# Patient Record
Sex: Female | Born: 1950 | Race: White | Hispanic: No | Smoking: Never smoker
Health system: Southern US, Community
[De-identification: ages and names within clinical notes are randomized; demographics above are authoritative.]

## PROBLEM LIST (undated history)

## (undated) DIAGNOSIS — K219 Gastro-esophageal reflux disease without esophagitis: Secondary | ICD-10-CM

## (undated) DIAGNOSIS — J449 Chronic obstructive pulmonary disease, unspecified: Secondary | ICD-10-CM

## (undated) DIAGNOSIS — B373 Candidiasis of vulva and vagina: Secondary | ICD-10-CM

## (undated) DIAGNOSIS — N200 Calculus of kidney: Secondary | ICD-10-CM

## (undated) DIAGNOSIS — F329 Major depressive disorder, single episode, unspecified: Secondary | ICD-10-CM

## (undated) DIAGNOSIS — T4145XA Adverse effect of unspecified anesthetic, initial encounter: Secondary | ICD-10-CM

## (undated) DIAGNOSIS — F32A Depression, unspecified: Secondary | ICD-10-CM

## (undated) DIAGNOSIS — R3129 Other microscopic hematuria: Secondary | ICD-10-CM

## (undated) DIAGNOSIS — R1011 Right upper quadrant pain: Secondary | ICD-10-CM

## (undated) DIAGNOSIS — E785 Hyperlipidemia, unspecified: Secondary | ICD-10-CM

## (undated) DIAGNOSIS — Z972 Presence of dental prosthetic device (complete) (partial): Secondary | ICD-10-CM

## (undated) DIAGNOSIS — K5792 Diverticulitis of intestine, part unspecified, without perforation or abscess without bleeding: Secondary | ICD-10-CM

## (undated) DIAGNOSIS — F419 Anxiety disorder, unspecified: Secondary | ICD-10-CM

## (undated) DIAGNOSIS — G4730 Sleep apnea, unspecified: Secondary | ICD-10-CM

## (undated) DIAGNOSIS — IMO0001 Reserved for inherently not codable concepts without codable children: Secondary | ICD-10-CM

## (undated) DIAGNOSIS — T8859XA Other complications of anesthesia, initial encounter: Secondary | ICD-10-CM

## (undated) DIAGNOSIS — N3946 Mixed incontinence: Secondary | ICD-10-CM

## (undated) DIAGNOSIS — T7840XA Allergy, unspecified, initial encounter: Secondary | ICD-10-CM

## (undated) DIAGNOSIS — B3731 Acute candidiasis of vulva and vagina: Secondary | ICD-10-CM

## (undated) DIAGNOSIS — H409 Unspecified glaucoma: Secondary | ICD-10-CM

## (undated) DIAGNOSIS — M1990 Unspecified osteoarthritis, unspecified site: Secondary | ICD-10-CM

## (undated) DIAGNOSIS — E079 Disorder of thyroid, unspecified: Secondary | ICD-10-CM

## (undated) DIAGNOSIS — J45909 Unspecified asthma, uncomplicated: Secondary | ICD-10-CM

## (undated) HISTORY — DX: Depression, unspecified: F32.A

## (undated) HISTORY — PX: EYE SURGERY: SHX253

## (undated) HISTORY — DX: Hyperlipidemia, unspecified: E78.5

## (undated) HISTORY — PX: JOINT REPLACEMENT: SHX530

## (undated) HISTORY — DX: Right upper quadrant pain: R10.11

## (undated) HISTORY — DX: Gastro-esophageal reflux disease without esophagitis: K21.9

## (undated) HISTORY — DX: Mixed incontinence: N39.46

## (undated) HISTORY — DX: Anxiety disorder, unspecified: F41.9

## (undated) HISTORY — DX: Unspecified asthma, uncomplicated: J45.909

## (undated) HISTORY — PX: BACK SURGERY: SHX140

## (undated) HISTORY — DX: Other microscopic hematuria: R31.29

## (undated) HISTORY — DX: Disorder of thyroid, unspecified: E07.9

## (undated) HISTORY — DX: Diverticulitis of intestine, part unspecified, without perforation or abscess without bleeding: K57.92

## (undated) HISTORY — DX: Allergy, unspecified, initial encounter: T78.40XA

## (undated) HISTORY — PX: TOTAL HIP ARTHROPLASTY: SHX124

## (undated) HISTORY — DX: Unspecified glaucoma: H40.9

## (undated) HISTORY — DX: Unspecified osteoarthritis, unspecified site: M19.90

## (undated) HISTORY — DX: Morbid (severe) obesity due to excess calories: E66.01

## (undated) HISTORY — DX: Sleep apnea, unspecified: G47.30

## (undated) HISTORY — DX: Acute candidiasis of vulva and vagina: B37.31

## (undated) HISTORY — PX: SPINE SURGERY: SHX786

## (undated) HISTORY — DX: Major depressive disorder, single episode, unspecified: F32.9

## (undated) HISTORY — DX: Calculus of kidney: N20.0

## (undated) HISTORY — DX: Candidiasis of vulva and vagina: B37.3

---

## 1996-09-08 HISTORY — PX: ABDOMINAL HYSTERECTOMY: SHX81

## 1998-09-08 HISTORY — PX: OTHER SURGICAL HISTORY: SHX169

## 2007-03-03 ENCOUNTER — Ambulatory Visit: Payer: Self-pay

## 2010-09-08 HISTORY — PX: LITHOTRIPSY: SUR834

## 2012-09-01 ENCOUNTER — Emergency Department: Payer: Self-pay | Admitting: Emergency Medicine

## 2012-09-01 LAB — URINALYSIS, COMPLETE
Bilirubin,UR: NEGATIVE
Blood: NEGATIVE
Glucose,UR: NEGATIVE mg/dL (ref 0–75)
Protein: 30
RBC,UR: 1 /HPF (ref 0–5)
Squamous Epithelial: 2
WBC UR: 423 /HPF (ref 0–5)

## 2012-09-04 LAB — URINE CULTURE

## 2012-09-08 HISTORY — PX: CATARACT EXTRACTION, BILATERAL: SHX1313

## 2013-09-19 ENCOUNTER — Emergency Department: Payer: Self-pay | Admitting: Emergency Medicine

## 2013-09-19 LAB — RAPID INFLUENZA A&B ANTIGENS

## 2013-09-19 LAB — CBC
HCT: 35.6 % (ref 35.0–47.0)
HGB: 11.7 g/dL — AB (ref 12.0–16.0)
MCH: 26.1 pg (ref 26.0–34.0)
MCHC: 32.8 g/dL (ref 32.0–36.0)
MCV: 80 fL (ref 80–100)
Platelet: 661 10*3/uL — ABNORMAL HIGH (ref 150–440)
RBC: 4.47 10*6/uL (ref 3.80–5.20)
RDW: 16.1 % — AB (ref 11.5–14.5)
WBC: 15.5 10*3/uL — ABNORMAL HIGH (ref 3.6–11.0)

## 2013-09-19 LAB — URINALYSIS, COMPLETE
BILIRUBIN, UR: NEGATIVE
GLUCOSE, UR: NEGATIVE mg/dL (ref 0–75)
Ketone: NEGATIVE
NITRITE: POSITIVE
PH: 5 (ref 4.5–8.0)
Protein: NEGATIVE
RBC,UR: 11 /HPF (ref 0–5)
Specific Gravity: 1.017 (ref 1.003–1.030)
Squamous Epithelial: 4
WBC UR: 51 /HPF (ref 0–5)

## 2013-09-19 LAB — COMPREHENSIVE METABOLIC PANEL
Albumin: 3.2 g/dL — ABNORMAL LOW (ref 3.4–5.0)
Alkaline Phosphatase: 137 U/L — ABNORMAL HIGH
Anion Gap: 5 — ABNORMAL LOW (ref 7–16)
BILIRUBIN TOTAL: 0.3 mg/dL (ref 0.2–1.0)
BUN: 18 mg/dL (ref 7–18)
CO2: 26 mmol/L (ref 21–32)
Calcium, Total: 9.1 mg/dL (ref 8.5–10.1)
Chloride: 106 mmol/L (ref 98–107)
Creatinine: 1.13 mg/dL (ref 0.60–1.30)
EGFR (African American): 60 — ABNORMAL LOW
GFR CALC NON AF AMER: 52 — AB
Glucose: 97 mg/dL (ref 65–99)
Osmolality: 276 (ref 275–301)
Potassium: 3.8 mmol/L (ref 3.5–5.1)
SGOT(AST): 11 U/L — ABNORMAL LOW (ref 15–37)
SGPT (ALT): 14 U/L (ref 12–78)
SODIUM: 137 mmol/L (ref 136–145)
Total Protein: 7.8 g/dL (ref 6.4–8.2)

## 2013-09-19 LAB — CK TOTAL AND CKMB (NOT AT ARMC): CK, Total: 28 U/L (ref 21–215)

## 2013-09-19 LAB — PRO B NATRIURETIC PEPTIDE: B-Type Natriuretic Peptide: 19 pg/mL (ref 0–125)

## 2013-09-19 LAB — TROPONIN I: Troponin-I: <0.02 ng/mL

## 2013-09-24 ENCOUNTER — Emergency Department: Payer: Self-pay | Admitting: Emergency Medicine

## 2013-09-24 LAB — COMPREHENSIVE METABOLIC PANEL
ALBUMIN: 3 g/dL — AB (ref 3.4–5.0)
ANION GAP: 10 (ref 7–16)
Alkaline Phosphatase: 109 U/L
BUN: 15 mg/dL (ref 7–18)
Bilirubin,Total: 0.4 mg/dL (ref 0.2–1.0)
Calcium, Total: 8.8 mg/dL (ref 8.5–10.1)
Chloride: 105 mmol/L (ref 98–107)
Co2: 23 mmol/L (ref 21–32)
Creatinine: 1.25 mg/dL (ref 0.60–1.30)
EGFR (African American): 53 — ABNORMAL LOW
GFR CALC NON AF AMER: 46 — AB
GLUCOSE: 101 mg/dL — AB (ref 65–99)
OSMOLALITY: 277 (ref 275–301)
Potassium: 3.8 mmol/L (ref 3.5–5.1)
SGOT(AST): 11 U/L — ABNORMAL LOW (ref 15–37)
SGPT (ALT): 15 U/L (ref 12–78)
Sodium: 138 mmol/L (ref 136–145)
Total Protein: 6.9 g/dL (ref 6.4–8.2)

## 2013-09-24 LAB — CBC WITH DIFFERENTIAL/PLATELET
Basophil #: 0.1 10*3/uL (ref 0.0–0.1)
Basophil %: 0.6 %
Eosinophil #: 0 10*3/uL (ref 0.0–0.7)
Eosinophil %: 0.5 %
HCT: 32.1 % — AB (ref 35.0–47.0)
HGB: 10.5 g/dL — AB (ref 12.0–16.0)
LYMPHS ABS: 1.1 10*3/uL (ref 1.0–3.6)
Lymphocyte %: 13.7 %
MCH: 26.1 pg (ref 26.0–34.0)
MCHC: 32.7 g/dL (ref 32.0–36.0)
MCV: 80 fL (ref 80–100)
Monocyte #: 1.3 x10 3/mm — ABNORMAL HIGH (ref 0.2–0.9)
Monocyte %: 15.8 %
Neutrophil #: 5.8 10*3/uL (ref 1.4–6.5)
Neutrophil %: 69.4 %
Platelet: 486 10*3/uL — ABNORMAL HIGH (ref 150–440)
RBC: 4.02 10*6/uL (ref 3.80–5.20)
RDW: 16.9 % — ABNORMAL HIGH (ref 11.5–14.5)
WBC: 8.3 10*3/uL (ref 3.6–11.0)

## 2013-09-24 LAB — URINALYSIS, COMPLETE
BACTERIA: NONE SEEN
BILIRUBIN, UR: NEGATIVE
GLUCOSE, UR: NEGATIVE mg/dL (ref 0–75)
Ketone: NEGATIVE
NITRITE: NEGATIVE
Ph: 5 (ref 4.5–8.0)
Protein: NEGATIVE
RBC,UR: 3 /HPF (ref 0–5)
SPECIFIC GRAVITY: 1.015 (ref 1.003–1.030)
Squamous Epithelial: 7
WBC UR: 5 /HPF (ref 0–5)

## 2013-11-07 ENCOUNTER — Emergency Department: Payer: Self-pay | Admitting: Emergency Medicine

## 2013-11-07 LAB — COMPREHENSIVE METABOLIC PANEL
ALT: 19 U/L (ref 12–78)
ANION GAP: 9 (ref 7–16)
AST: 14 U/L — AB (ref 15–37)
Albumin: 3.5 g/dL (ref 3.4–5.0)
Alkaline Phosphatase: 122 U/L — ABNORMAL HIGH
BUN: 13 mg/dL (ref 7–18)
Bilirubin,Total: 0.4 mg/dL (ref 0.2–1.0)
CHLORIDE: 103 mmol/L (ref 98–107)
Calcium, Total: 9.5 mg/dL (ref 8.5–10.1)
Co2: 24 mmol/L (ref 21–32)
Creatinine: 1.11 mg/dL (ref 0.60–1.30)
GFR CALC NON AF AMER: 53 — AB
GLUCOSE: 100 mg/dL — AB (ref 65–99)
OSMOLALITY: 272 (ref 275–301)
POTASSIUM: 3.8 mmol/L (ref 3.5–5.1)
Sodium: 136 mmol/L (ref 136–145)
TOTAL PROTEIN: 7.8 g/dL (ref 6.4–8.2)

## 2013-11-07 LAB — CBC WITH DIFFERENTIAL/PLATELET
BASOS PCT: 0.9 %
Basophil #: 0.1 10*3/uL (ref 0.0–0.1)
EOS PCT: 0.3 %
Eosinophil #: 0 10*3/uL (ref 0.0–0.7)
HCT: 36 % (ref 35.0–47.0)
HGB: 11.3 g/dL — ABNORMAL LOW (ref 12.0–16.0)
Lymphocyte #: 1.6 10*3/uL (ref 1.0–3.6)
Lymphocyte %: 15.3 %
MCH: 25.4 pg — ABNORMAL LOW (ref 26.0–34.0)
MCHC: 31.4 g/dL — AB (ref 32.0–36.0)
MCV: 81 fL (ref 80–100)
Monocyte #: 1.3 x10 3/mm — ABNORMAL HIGH (ref 0.2–0.9)
Monocyte %: 12.6 %
NEUTROS ABS: 7.3 10*3/uL — AB (ref 1.4–6.5)
Neutrophil %: 70.9 %
PLATELETS: 363 10*3/uL (ref 150–440)
RBC: 4.46 10*6/uL (ref 3.80–5.20)
RDW: 16.3 % — ABNORMAL HIGH (ref 11.5–14.5)
WBC: 10.4 10*3/uL (ref 3.6–11.0)

## 2013-11-07 LAB — URINALYSIS, COMPLETE
Bilirubin,UR: NEGATIVE
Glucose,UR: NEGATIVE mg/dL (ref 0–75)
KETONE: NEGATIVE
Nitrite: NEGATIVE
PH: 5 (ref 4.5–8.0)
Protein: NEGATIVE
RBC,UR: 11 /HPF (ref 0–5)
SPECIFIC GRAVITY: 1.015 (ref 1.003–1.030)
Squamous Epithelial: 1
WBC UR: 35 /HPF (ref 0–5)

## 2013-11-10 LAB — URINE CULTURE

## 2014-01-25 ENCOUNTER — Emergency Department: Payer: Self-pay

## 2014-01-25 LAB — URINALYSIS, COMPLETE
Bacteria: NONE SEEN
Bilirubin,UR: NEGATIVE
Blood: NEGATIVE
Glucose,UR: NEGATIVE mg/dL (ref 0–75)
Ketone: NEGATIVE
NITRITE: POSITIVE
Ph: 5 (ref 4.5–8.0)
Protein: NEGATIVE
Specific Gravity: 1.018 (ref 1.003–1.030)
Squamous Epithelial: 7
WBC UR: 14 /HPF (ref 0–5)

## 2014-04-09 ENCOUNTER — Emergency Department: Payer: Self-pay | Admitting: Emergency Medicine

## 2014-04-09 LAB — CBC WITH DIFFERENTIAL/PLATELET
BASOS ABS: 0.1 10*3/uL (ref 0.0–0.1)
Basophil %: 0.8 %
Eosinophil #: 0.1 10*3/uL (ref 0.0–0.7)
Eosinophil %: 0.7 %
HCT: 35.4 % (ref 35.0–47.0)
HGB: 11.2 g/dL — AB (ref 12.0–16.0)
LYMPHS PCT: 17.3 %
Lymphocyte #: 2.1 10*3/uL (ref 1.0–3.6)
MCH: 25.1 pg — ABNORMAL LOW (ref 26.0–34.0)
MCHC: 31.7 g/dL — ABNORMAL LOW (ref 32.0–36.0)
MCV: 79 fL — ABNORMAL LOW (ref 80–100)
MONO ABS: 1.6 x10 3/mm — AB (ref 0.2–0.9)
Monocyte %: 12.7 %
Neutrophil #: 8.4 10*3/uL — ABNORMAL HIGH (ref 1.4–6.5)
Neutrophil %: 68.5 %
PLATELETS: 447 10*3/uL — AB (ref 150–440)
RBC: 4.48 10*6/uL (ref 3.80–5.20)
RDW: 16.1 % — AB (ref 11.5–14.5)
WBC: 12.2 10*3/uL — ABNORMAL HIGH (ref 3.6–11.0)

## 2014-04-09 LAB — URINALYSIS, COMPLETE
BILIRUBIN, UR: NEGATIVE
Blood: NEGATIVE
Glucose,UR: NEGATIVE mg/dL (ref 0–75)
KETONE: NEGATIVE
NITRITE: NEGATIVE
Ph: 5 (ref 4.5–8.0)
Protein: 25
Specific Gravity: 1.01 (ref 1.003–1.030)
Squamous Epithelial: 2
Transitional Epi: 1
WBC UR: 729 /HPF (ref 0–5)

## 2014-04-09 LAB — COMPREHENSIVE METABOLIC PANEL
ALK PHOS: 126 U/L — AB
ALT: 20 U/L
ANION GAP: 13 (ref 7–16)
AST: 21 U/L (ref 15–37)
Albumin: 3.4 g/dL (ref 3.4–5.0)
BILIRUBIN TOTAL: 0.3 mg/dL (ref 0.2–1.0)
BUN: 13 mg/dL (ref 7–18)
CALCIUM: 9.2 mg/dL (ref 8.5–10.1)
CREATININE: 1.1 mg/dL (ref 0.60–1.30)
Chloride: 106 mmol/L (ref 98–107)
Co2: 20 mmol/L — ABNORMAL LOW (ref 21–32)
EGFR (African American): >60
EGFR (Non-African Amer.): 53 — ABNORMAL LOW
Glucose: 103 mg/dL — ABNORMAL HIGH (ref 65–99)
Osmolality: 278 (ref 275–301)
POTASSIUM: 4.2 mmol/L (ref 3.5–5.1)
Sodium: 139 mmol/L (ref 136–145)
TOTAL PROTEIN: 7.3 g/dL (ref 6.4–8.2)

## 2014-04-11 LAB — URINE CULTURE

## 2014-04-14 LAB — CULTURE, BLOOD (SINGLE)

## 2014-07-24 ENCOUNTER — Emergency Department: Payer: Self-pay | Admitting: Emergency Medicine

## 2014-07-24 LAB — CBC WITH DIFFERENTIAL/PLATELET
Basophil #: 0.2 10*3/uL — ABNORMAL HIGH (ref 0.0–0.1)
Basophil %: 1.5 %
Eosinophil #: 0.2 10*3/uL (ref 0.0–0.7)
Eosinophil %: 1.4 %
HCT: 37.4 % (ref 35.0–47.0)
HGB: 11.7 g/dL — ABNORMAL LOW (ref 12.0–16.0)
LYMPHS ABS: 2.6 10*3/uL (ref 1.0–3.6)
Lymphocyte %: 23.2 %
MCH: 24.3 pg — AB (ref 26.0–34.0)
MCHC: 31.2 g/dL — AB (ref 32.0–36.0)
MCV: 78 fL — ABNORMAL LOW (ref 80–100)
MONO ABS: 0.9 x10 3/mm (ref 0.2–0.9)
MONOS PCT: 8.3 %
NEUTROS PCT: 65.6 %
Neutrophil #: 7.4 10*3/uL — ABNORMAL HIGH (ref 1.4–6.5)
Platelet: 437 10*3/uL (ref 150–440)
RBC: 4.8 10*6/uL (ref 3.80–5.20)
RDW: 17.7 % — AB (ref 11.5–14.5)
WBC: 11.3 10*3/uL — ABNORMAL HIGH (ref 3.6–11.0)

## 2014-07-24 LAB — COMPREHENSIVE METABOLIC PANEL
ALT: 20 U/L
Albumin: 3 g/dL — ABNORMAL LOW (ref 3.4–5.0)
Alkaline Phosphatase: 141 U/L — ABNORMAL HIGH
Anion Gap: 11 (ref 7–16)
BUN: 14 mg/dL (ref 7–18)
Bilirubin,Total: 0.3 mg/dL (ref 0.2–1.0)
CALCIUM: 8.7 mg/dL (ref 8.5–10.1)
CHLORIDE: 107 mmol/L (ref 98–107)
CO2: 22 mmol/L (ref 21–32)
CREATININE: 1.18 mg/dL (ref 0.60–1.30)
EGFR (Non-African Amer.): 49 — ABNORMAL LOW
GFR CALC AF AMER: 60 — AB
Glucose: 122 mg/dL — ABNORMAL HIGH (ref 65–99)
Osmolality: 281 (ref 275–301)
POTASSIUM: 3.8 mmol/L (ref 3.5–5.1)
SGOT(AST): 15 U/L (ref 15–37)
SODIUM: 140 mmol/L (ref 136–145)
TOTAL PROTEIN: 7.3 g/dL (ref 6.4–8.2)

## 2014-07-24 LAB — URINALYSIS, COMPLETE
BILIRUBIN, UR: NEGATIVE
Blood: NEGATIVE
Glucose,UR: NEGATIVE mg/dL (ref 0–75)
Ketone: NEGATIVE
Nitrite: NEGATIVE
PROTEIN: NEGATIVE
Ph: 6 (ref 4.5–8.0)
RBC,UR: NONE SEEN /HPF (ref 0–5)
Specific Gravity: 1.014 (ref 1.003–1.030)
Squamous Epithelial: 5
WBC UR: 20 /HPF (ref 0–5)

## 2014-08-27 ENCOUNTER — Emergency Department: Payer: Self-pay | Admitting: Emergency Medicine

## 2014-08-30 LAB — CBC
HCT: 40.2 % (ref 35.0–47.0)
HGB: 12.7 g/dL (ref 12.0–16.0)
MCH: 24.9 pg — ABNORMAL LOW (ref 26.0–34.0)
MCHC: 31.6 g/dL — AB (ref 32.0–36.0)
MCV: 79 fL — AB (ref 80–100)
Platelet: 420 10*3/uL (ref 150–440)
RBC: 5.09 10*6/uL (ref 3.80–5.20)
RDW: 17.3 % — ABNORMAL HIGH (ref 11.5–14.5)
WBC: 5.4 10*3/uL (ref 3.6–11.0)

## 2014-08-30 LAB — BASIC METABOLIC PANEL
Anion Gap: 10 (ref 7–16)
BUN: 13 mg/dL (ref 7–18)
Calcium, Total: 8.6 mg/dL (ref 8.5–10.1)
Chloride: 107 mmol/L (ref 98–107)
Co2: 22 mmol/L (ref 21–32)
Creatinine: 1.26 mg/dL (ref 0.60–1.30)
EGFR (African American): 55 — ABNORMAL LOW
GFR CALC NON AF AMER: 46 — AB
Glucose: 87 mg/dL (ref 65–99)
Osmolality: 277 (ref 275–301)
Potassium: 4.2 mmol/L (ref 3.5–5.1)
SODIUM: 139 mmol/L (ref 136–145)

## 2014-08-30 LAB — TROPONIN I: Troponin-I: <0.02 ng/mL

## 2014-08-31 ENCOUNTER — Inpatient Hospital Stay: Payer: Self-pay | Admitting: Internal Medicine

## 2014-08-31 ENCOUNTER — Inpatient Hospital Stay (HOSPITAL_COMMUNITY)
Admission: EM | Admit: 2014-08-31 | Payer: Self-pay | Source: Other Acute Inpatient Hospital | Admitting: Internal Medicine

## 2014-09-01 LAB — CBC WITH DIFFERENTIAL/PLATELET
Basophil #: 0 10*3/uL (ref 0.0–0.1)
Basophil %: 0.2 %
EOS ABS: 0 10*3/uL (ref 0.0–0.7)
Eosinophil %: 0 %
HCT: 36 % (ref 35.0–47.0)
HGB: 11.2 g/dL — ABNORMAL LOW (ref 12.0–16.0)
Lymphocyte #: 1.1 10*3/uL (ref 1.0–3.6)
Lymphocyte %: 9.3 %
MCH: 24.6 pg — AB (ref 26.0–34.0)
MCHC: 31 g/dL — ABNORMAL LOW (ref 32.0–36.0)
MCV: 79 fL — AB (ref 80–100)
MONO ABS: 0.9 x10 3/mm (ref 0.2–0.9)
Monocyte %: 7.2 %
NEUTROS PCT: 83.3 %
Neutrophil #: 10.1 10*3/uL — ABNORMAL HIGH (ref 1.4–6.5)
Platelet: 389 10*3/uL (ref 150–440)
RBC: 4.53 10*6/uL (ref 3.80–5.20)
RDW: 17.4 % — ABNORMAL HIGH (ref 11.5–14.5)
WBC: 12.1 10*3/uL — AB (ref 3.6–11.0)

## 2014-09-05 LAB — CULTURE, BLOOD (SINGLE)

## 2014-12-11 DIAGNOSIS — H409 Unspecified glaucoma: Secondary | ICD-10-CM | POA: Insufficient documentation

## 2014-12-11 DIAGNOSIS — K219 Gastro-esophageal reflux disease without esophagitis: Secondary | ICD-10-CM | POA: Insufficient documentation

## 2014-12-11 DIAGNOSIS — F329 Major depressive disorder, single episode, unspecified: Secondary | ICD-10-CM | POA: Insufficient documentation

## 2014-12-11 DIAGNOSIS — N951 Menopausal and female climacteric states: Secondary | ICD-10-CM | POA: Insufficient documentation

## 2014-12-11 DIAGNOSIS — G4733 Obstructive sleep apnea (adult) (pediatric): Secondary | ICD-10-CM | POA: Insufficient documentation

## 2014-12-11 DIAGNOSIS — K5792 Diverticulitis of intestine, part unspecified, without perforation or abscess without bleeding: Secondary | ICD-10-CM | POA: Insufficient documentation

## 2014-12-11 DIAGNOSIS — F32A Depression, unspecified: Secondary | ICD-10-CM | POA: Insufficient documentation

## 2014-12-11 DIAGNOSIS — N3941 Urge incontinence: Secondary | ICD-10-CM | POA: Insufficient documentation

## 2014-12-11 DIAGNOSIS — M47816 Spondylosis without myelopathy or radiculopathy, lumbar region: Secondary | ICD-10-CM | POA: Insufficient documentation

## 2014-12-11 DIAGNOSIS — N390 Urinary tract infection, site not specified: Secondary | ICD-10-CM | POA: Insufficient documentation

## 2014-12-11 DIAGNOSIS — Z7189 Other specified counseling: Secondary | ICD-10-CM | POA: Insufficient documentation

## 2014-12-11 DIAGNOSIS — J45909 Unspecified asthma, uncomplicated: Secondary | ICD-10-CM | POA: Insufficient documentation

## 2014-12-11 DIAGNOSIS — E785 Hyperlipidemia, unspecified: Secondary | ICD-10-CM | POA: Insufficient documentation

## 2014-12-11 DIAGNOSIS — N200 Calculus of kidney: Secondary | ICD-10-CM | POA: Insufficient documentation

## 2014-12-11 DIAGNOSIS — J4540 Moderate persistent asthma, uncomplicated: Secondary | ICD-10-CM | POA: Insufficient documentation

## 2014-12-11 DIAGNOSIS — B3731 Acute candidiasis of vulva and vagina: Secondary | ICD-10-CM | POA: Insufficient documentation

## 2014-12-11 DIAGNOSIS — Z0110 Encounter for examination of ears and hearing without abnormal findings: Secondary | ICD-10-CM | POA: Insufficient documentation

## 2014-12-11 DIAGNOSIS — B373 Candidiasis of vulva and vagina: Secondary | ICD-10-CM | POA: Insufficient documentation

## 2014-12-11 DIAGNOSIS — IMO0002 Reserved for concepts with insufficient information to code with codable children: Secondary | ICD-10-CM | POA: Insufficient documentation

## 2014-12-15 ENCOUNTER — Ambulatory Visit
Admit: 2014-12-15 | Disposition: A | Discharge status: Home / Self Care | Payer: Self-pay | Attending: Urology | Admitting: Urology

## 2014-12-27 ENCOUNTER — Ambulatory Visit
Admit: 2014-12-27 | Disposition: A | Discharge status: Home / Self Care | Payer: Self-pay | Attending: Obstetrics and Gynecology | Admitting: Obstetrics and Gynecology

## 2014-12-30 NOTE — H&P (Signed)
PATIENT NAME:  Faith Padilla, Faith Padilla MR#:  [id number] DATE OF BIRTH:  [birth year]  DATE OF ADMISSION:  08/30/2014  REFERRING DOCTOR: Loney Hering, MD  PRIMARY CARE DOCTOR: [place] , in [place], [place].   CHIEF COMPLAINT: Cough ongoing for the past 5 days.    HISTORY OF PRESENT ILLNESS: A 64 year old Caucasian female with a past medical history significant for bronchial asthma, hyperlipidemia, diverticulitis, kidney stones, depression, glaucoma, sleep apnea on CPAP presents to the Emergency Room accompanied by her daughter with the complaints of ongoing cough for the past 5 days. The patient states she has been having some cough and not able to bring up any phlegm for the past 5 days for which she was seen at a local urgent care center and was given Z-Pak. She continued to have worsening of the cough with low grade fever. Hence, she came to the Emergency Room for further evaluation. In the Emergency Room, the patient was evaluated by the ED physician and was found to have severe cough and also shortness of breath for which she received multiple doses of DuoNeb nebulizers and had workup done, which was essentially normal blood work and chest x-ray was negative for any infection. The hospitalist service was consulted for further evaluation and management. The patient still has significant cough and shortness of breath at this time and she stated that she is not feeling any anything better compared to when she came here. Of note, her, oxygen saturation on room air are running around 95%-96% at this time.    PAST MEDICAL HISTORY:  1.  Bronchial asthma.  2.  Hyperlipidemia.  3.  Diverticulitis.   4.  Kidney stones.  5.  Obstructive sleep apnea on CPAP.  6.  Glaucoma.  7.  Depression.   PAST SURGICAL HISTORY:  1.  Status post hysterectomy.  2.  Back surgery.   ALLERGIES: MORPHINE.   HOME MEDICATIONS: Symbicort, omeprazole, Zyrtec, meloxicam, Singulair, duloxetine, albuterol, Flonase.     FAMILY HISTORY: Significant for heart disease and cancers.   SOCIAL HISTORY: She is divorced, lives at home with her daughter. No history of smoking, alcohol, or substance abuse.    REVIEW OF SYSTEMS:   CONSTITUTIONAL: Positive for low-grade fever for the past 5 days associated with some cough. Generalized weakness present.  EYES: Negative for blurred vision, double vision. No pain. No redness. No inflammation.  EARS, NOSE, AND THROAT: Negative for tinnitus, ear pain, hearing loss, epistaxis, nasal discharge, postnasal drip.  RESPIRATORY: Positive for cough, wheezing with dyspnea ongoing for the past 5 days. No painful respirations. No hemoptysis.   CARDIOVASCULAR: Negative for chest pain. No pedal edema. No palpitations. No dizziness or syncope.  GASTROINTESTINAL: Negative for nausea, vomiting, diarrhea, abdominal pain, hematemesis, or GERD symptoms.  GENITOURINARY: Negative for disease or hematuria, frequency, or urgency.  ENDOCRINE: Negative for polyuria or nocturia. No thyroid problems. No heat or cold intolerance.  HEMATOLOGIC: Negative for anemia, easy bruising or bleeding, or swollen glands.  INTEGUMENTARY: Negative for acne, skin rash, or lesions.  MUSCULOSKELETAL: Negative for any arthritis, joint swellings, or pain.  NEUROLOGICAL: Negative for focal weakness, numbness. No history of CVA, TIA, or seizure disorder.  PSYCHIATRIC: Positive for depression for which she takes medications.   PHYSICAL EXAMINATION:  VITAL SIGNS: Temperature 99.4 disorder, pulse rate 108 per minute, respirations 20 per minute, blood pressure 155/91, oxygen saturation 94% on room air.  GENERAL: Well developed, well-nourished. Alert, awake, and oriented. In mild to moderate respiratory distress.  HEAD: Atraumatic, normocephalic.  EYES: Pupils are equal and reactive to light and accommodation. No conjunctival pallor. No scleral icterus. Extraocular movements are intact.  NOSE: No nasal lesions. No drainage.   EARS: No drainage. No external lesions.  ORAL CAVITY: No mucosal lesions. No exudates.  NECK: Supple. No JVD. No thyromegaly. No carotid bruit. Range of motion of neck normal.  RESPIRATORY: Bilateral rhonchi present. Using accessory muscles of respiration.  CARDIOVASCULAR: S1, S2 regular. Tachycardia present. Peripheral pulses equal at carotid, femoral, and pedal pulses. No peripheral edema.  GASTROINTESTINAL: Abdomen is soft, nontender. No hepatosplenomegaly. Bowel sounds present and equal in all 4 quadrants. No tenderness. No guarding. No rigidity.  GENITOURINARY: Deferred.  MUSCULOSKELETAL: Range of motion adequate in all areas. Strength and tone equal bilaterally in both upper and lower extremities.  SKIN: Inspection within normal limits.  LYMPHATIC: No cervical lymphadenopathy.  VASCULAR: Good dorsalis pedis and posterior tibial pulses.  NEUROLOGICAL: Alert, awake, and oriented x 3. Cranial nerves II-XII grossly intact. DTRs 2+ bilateral in both upper and lower extremities. Motor strength is 5/5 in both upper and lower extremities.  PSYCHIATRIC: Judgment and insight are adequate. Alert and oriented x 3. Memory and mood within normal limits.   LABORATORY DATA: Serum glucose 87, BUN 13, creatinine 1.26, sodium 138, potassium 4.2, chloride 107, bicarbonate 22, total calcium 8.6. Troponin less than 0.02. WBC 5.4, hemoglobin 12.7, hematocrit 40.2, platelet count 420,000.   IMAGING STUDIES:  Chest x-ray:  1.  No acute cardiopulmonary process.  2.  Moderate to large hiatal hernia seen.   ASSESSMENT AND PLAN: A 64 year old Caucasian female with a past medical history significant for bronchial asthma, hyperlipidemia, diverticulosis, obstructive sleep apnea on CPAP, history of kidney stones, depression, and glaucoma presents to the Emergency Room with the complaints of ongoing cough and shortness of breath with wheezing for the past 5 days.   1.  Acute respiratory distress secondary to bronchial  asthma exacerbation.  PLAN: Admit to stepdown unit in critical care unit, oxygen supplementation, vigorous nebulizers, intravenous Solu-Medrol. Sputum cultures and sensitivity. Start Levaquin for bronchitis vs early pneumonia. Monitor oxygen saturation.  2.  Acute exacerbation of bronchial asthma secondary due to acute bronchitis, rule out pneumonia.  PLAN: Vigorous nebulizers, oxygen supplementation, intravenous  Solu-Medrol, intravenous Levaquin monitor, oxygen saturation.  3.  Obstructive sleep apnea on CPAP: Continue CPAP.  4.  History of depression on duloxetine, stable: Continue same.  5.  History of hyperlipidemia: Continue home medications.  6.  Deep vein thrombosis prophylaxis: Subcutaneous Lovenox.  7.  Gastrointestinal prophylaxis: Protonix.   CODE STATUS: Full code.   TIME SPENT: 50 minutes.     ____________________________ Juluis Mire, MD enr:bm D: 08/31/2014 05:32:00 ET T: 08/31/2014 06:21:20 ET JOB#: [id number]  cc: Juluis Mire, MD, <Dictator> Juluis Mire MD ELECTRONICALLY SIGNED 08/31/2014 20:33

## 2015-01-03 NOTE — Discharge Summary (Signed)
PATIENT NAME:  Faith Padilla, DIPIERRO MR#:  [id number] DATE OF BIRTH:  05/30/1951  DATE OF ADMISSION:  08/31/2014 DATE OF DISCHARGE:  09/01/2014  ADMITTING PHYSICIAN:  Dr. Reece Levy  DISCHARGING PHYSICIAN: Gladstone Lighter, MD.   PRIMARY CARE PHYSICIAN:  In [place], [place], [place]: None.   DISCHARGE DIAGNOSES:  1.  Asthma exacerbation with bronchitis.  2.  Obstructive sleep apnea on CPAP.  3.  Diverticulosis.  4.  Hyperlipidemia.  5.  Depression.   DISCHARGE HOME MEDICATIONS:  1.  Flovent inhaler 44 mcg 2 puffs inhaled twice a day. 2.  Proventil inhaler 90 mcg 2 puffs inhaled every 2 hours as needed for shortness of breath. 3.  Latanoprost ophthalmic 0.005% drops, 1 drop each eye once a day at bedtime.  4.  Cetrizine 10 mg p.o. daily.  5.  Singulair 10 mg p.o. daily.  6.  Cymbalta 30 mg p.o. daily.  7.  Flonase 50 mcg 2 sprays each nostril, twice a day. 8.  Omeprazole 20 mg p.o. b.i.d.  9.  Prednisone taper over 5 more days.  10. Levaquin 500 mg p.o. daily for 4 more days.   DISCHARGE OXYGEN: None.   DISCHARGE DIET: Low-sodium diet.   DISCHARGE ACTIVITY: As tolerated.     FOLLOWUP INSTRUCTIONS:   PCP to be followed up in 1 week.   LABORATORY DATA AND IMAGING STUDIES PRIOR TO DISCHARGE: WBC 12.9, hemoglobin 11.8, hematocrit 36.0, platelet count 389,000.   Lactic acid elevated at 2.7. Blood cultures negative. ABG showing pH of 7.45, pCO2 of 22, pO2 of 99, bicarbonate 21, saturations of 97% on room air Chest x-ray showing no acute cardiopulmonary process.  Lungs are evaluated and clear, moderate to large hernia noted.   Sodium 139, potassium 4.2, chloride 107, bicarbonate 22, BUN 13, creatinine 1.26, glucose of 87, calcium of 8.6.   BRIEF HOSPITAL COURSE: Ms. Torpey is a 64 year old Caucasian female admitted for acute asthma flare. She does have a known history of asthma, denies any sick contacts.  1.  Acute on chronic asthma exacerbation.  Started on steroids and empiric Levaquin for bronchitis symptoms and inhalers and nebs were given in the hospital, with significant improvement and the patient is being discharged home.  2.  Depression. She is on Cymbalta.  3.  Her course has been otherwise uneventful in the hospital.   DISCHARGE CONDITION: Stable.   DISCHARGE DISPOSITION: Home.   TIME SPENT ON DISCHARGE: 40 minutes.     ____________________________ Gladstone Lighter, MD rk:DT D: 09/02/2014 11:40:00 ET T: 09/02/2014 12:35:14 ET JOB#: [id number]  cc: Gladstone Lighter, MD, <Dictator> Gladstone Lighter MD ELECTRONICALLY SIGNED 09/12/2014 18:38

## 2015-02-08 ENCOUNTER — Ambulatory Visit: Payer: Medicaid Other | Admitting: Family Medicine

## 2015-02-12 ENCOUNTER — Other Ambulatory Visit: Payer: Self-pay | Admitting: Urology

## 2015-02-12 DIAGNOSIS — N200 Calculus of kidney: Secondary | ICD-10-CM

## 2015-02-14 ENCOUNTER — Ambulatory Visit: Payer: Self-pay | Admitting: Urology

## 2015-02-19 ENCOUNTER — Other Ambulatory Visit: Payer: Self-pay

## 2015-02-19 DIAGNOSIS — J4530 Mild persistent asthma, uncomplicated: Secondary | ICD-10-CM

## 2015-02-19 DIAGNOSIS — K219 Gastro-esophageal reflux disease without esophagitis: Secondary | ICD-10-CM

## 2015-02-19 MED ORDER — CETIRIZINE HCL 10 MG PO TABS: 10.0000 mg | ORAL_TABLET | Freq: Every day | ORAL | Status: DC

## 2015-02-19 MED ORDER — OMEPRAZOLE 20 MG PO CPDR: 20.0000 mg | DELAYED_RELEASE_CAPSULE | Freq: Two times a day (BID) | ORAL | Status: DC

## 2015-02-19 NOTE — Telephone Encounter (Signed)
Refill requested from patients pharmacy.

## 2015-03-05 ENCOUNTER — Encounter: Payer: Self-pay | Admitting: Urology

## 2015-03-05 ENCOUNTER — Ambulatory Visit
Admission: RE | Admit: 2015-03-05 | Discharge: 2015-03-05 | Disposition: A | Discharge status: Home / Self Care | Payer: Medicaid Other | Source: Ambulatory Visit | Attending: Urology | Admitting: Urology

## 2015-03-05 ENCOUNTER — Ambulatory Visit (INDEPENDENT_AMBULATORY_CARE_PROVIDER_SITE_OTHER): Payer: Medicaid Other | Admitting: Urology

## 2015-03-05 VITALS — BP 138/81 | HR 118 | Ht 63.0 in | Wt 268.8 lb

## 2015-03-05 DIAGNOSIS — R32 Unspecified urinary incontinence: Secondary | ICD-10-CM

## 2015-03-05 DIAGNOSIS — N200 Calculus of kidney: Secondary | ICD-10-CM

## 2015-03-05 DIAGNOSIS — N952 Postmenopausal atrophic vaginitis: Secondary | ICD-10-CM

## 2015-03-05 LAB — MICROSCOPIC EXAMINATION: Bacteria, UA: NONE SEEN

## 2015-03-05 LAB — URINALYSIS, COMPLETE
BILIRUBIN UA: NEGATIVE
GLUCOSE, UA: NEGATIVE
Leukocytes, UA: NEGATIVE
Nitrite, UA: NEGATIVE
PH UA: 5 (ref 5.0–7.5)
RBC UA: NEGATIVE
Specific Gravity, UA: 1.025 (ref 1.005–1.030)
Urobilinogen, Ur: 0.2 mg/dL (ref 0.2–1.0)

## 2015-03-05 LAB — BLADDER SCAN AMB NON-IMAGING: Scan Result: 48

## 2015-03-05 NOTE — Progress Notes (Signed)
In and Out Catheterization  Patient is present today for a I & O catheterization due to urinary incontinence. Patient was cleaned and prepped in a sterile fashion with betadine a 16FR cath was inserted with jelly on the tip  no complications were noted , 27ml of urine noted, urine was clear and light yellow in color. I & 0 performed to get a clean catch urine. Preformed by: Lyndee Hensen CMA

## 2015-03-05 NOTE — Progress Notes (Signed)
03/05/2015 3:06 PM   Faith Padilla [birth year] [id number]  Referring provider: Roselee Nova, MD [address] [place], [place] [address]  Chief Complaint  Patient presents with  . Urinary Incontinence    6 week follow up with KUB prior    HPI: Faith Padilla is a 64 year old white female with a history of kidney stones, urinary incontinence, microscopic hematuria and atrophic vaginitis presents today for 6 weeks follow-up.  At her last visit 6 weeks ago, Dr. Erlene Quan prescribed Myrbetriq 25 mg 1 tablet daily for her mixed urinary incontinence and advised her to lose weight.   She discontinued the Myrbetriq due to her inability to urinate after 2 weeks on the medication. She is now urinating, but she does not feels she is completely emptying her bladder.  Her PVR today is 48 mL.    She also did not start the estrogen cream prescribed to her for atrophic vaginitis because she read the side effects pamphlet given with the prescription and was afraid that she will have a heart attack if she used the vaginal estrogen cream.  She does not endorse any gross hematuria and KUB taken today did not demonstrate any urinary calculi.  I have reviewed the films with the patient.   PMH: Past Medical History  Diagnosis Date  . Depression   . Arthritis     Surgical History: Past Surgical History  Procedure Laterality Date  . Abdominal hysterectomy    . Back surgery    . Cataract extraction, bilateral      Home Medications:    Medication List       This list is accurate as of: 03/05/15  3:06 PM.  Always use your most recent med list.               albuterol 108 (90 BASE) MCG/ACT inhaler  Commonly known as:  PROVENTIL HFA;VENTOLIN HFA  Inhale 180 mcg into the lungs. 2 puffs every 6 hours as needed     atorvastatin 20 MG tablet  Commonly known as:  LIPITOR  Take 20 mg by mouth daily. bedtime     budesonide-formoterol 160-4.5 MCG/ACT inhaler  Commonly known as:   SYMBICORT  Inhale into the lungs 2 (two) times daily. 160-4.5     cetirizine 10 MG tablet  Commonly known as:  ZYRTEC  Take 1 tablet (10 mg total) by mouth at bedtime. Bedtime     DULoxetine 30 MG capsule  Commonly known as:  CYMBALTA  Take 30 mg by mouth daily.     fluticasone 50 MCG/ACT nasal spray  Commonly known as:  FLONASE  Place 50 mcg into the nose 2 (two) times daily. 2 sprays each nostril twice daily     latanoprost 0.005 % ophthalmic solution  Commonly known as:  XALATAN  Apply 0.005 drops to eye at bedtime.     meloxicam 7.5 MG tablet  Commonly known as:  MOBIC  Take 7.5 mg by mouth daily.     montelukast 10 MG tablet  Commonly known as:  SINGULAIR  Take 10 mg by mouth daily.     MULTIPLE VITAMIN PO  Take by mouth daily.     MYRBETRIQ 25 MG Tb24 tablet  Generic drug:  mirabegron ER  TK 1 T PO QD     OMEGA-3 FATTY ACIDS PO  Take by mouth 2 (two) times daily.     omeprazole 20 MG capsule  Commonly known as:  PRILOSEC  Take 1 capsule (20  mg total) by mouth 2 (two) times daily before a meal.     PATADAY 0.2 % Soln  Generic drug:  Olopatadine HCl  INSTILL 1 DROP IN BOTH EYES QD FOR ITCHY EYES     PREMARIN vaginal cream  Generic drug:  conjugated estrogens     Vitamin C Chew  Chew by mouth daily.     Vitamin D (Ergocalciferol) 50000 UNITS Caps capsule  Commonly known as:  DRISDOL  TK 1 C PO ONCE WEEKLY        Allergies:  Allergies  Allergen Reactions  . Morphine Hives    Family History: Family History  Problem Relation Age of Onset  . Heart disease Mother   . Diabetes Mother   . Cancer Father     lung cancer  . COPD Father   . Cancer Sister     Brain Cancer  . Kidney disease Mother     Social History:  reports that she has never smoked. She does not have any smokeless tobacco history on file. She reports that she does not drink alcohol or use illicit drugs.  ROS: Urological Symptom Review  Patient is experiencing the following  symptoms: Hard to postpone urination Leakage of urine   Review of Systems  Gastrointestinal (upper)  : Negative for upper GI symptoms  Gastrointestinal (lower) : Negative for lower GI symptoms  Constitutional : Negative for symptoms  Skin: Negative for skin symptoms  Eyes: Negative for eye symptoms  Ear/Nose/Throat : Negative for Ear/Nose/Throat symptoms  Hematologic/Lymphatic: Negative for Hematologic/Lymphatic symptoms  Cardiovascular : Negative for cardiovascular symptoms  Respiratory : Cough Shortness of breath  Endocrine: Excessive thirst  Musculoskeletal: Joint pain  Neurological: Negative for neurological symptoms  Psychologic: Negative for psychiatric symptoms   Physical Exam: BP 138/81 mmHg  Pulse 118  Ht 5\' 3"  (1.6 m)  Wt 268 lb 12.8 oz (121.927 kg)  BMI 47.63 kg/m2   Laboratory Data: Results for orders placed or performed in visit on 03/05/15  Microscopic Examination  Result Value Ref Range   WBC, UA 0-5 0 -  5 /hpf   RBC, UA 0-2 0 -  2 /hpf   Epithelial Cells (non renal) 0-10 0 - 10 /hpf   Renal Epithel, UA 0-10 (A) None seen /hpf   Crystals Present (A) N/A   Crystal Type Calcium Oxalate N/A   Bacteria, UA None seen None seen/Few  Urinalysis, Complete  Result Value Ref Range   Specific Gravity, UA 1.025 1.005 - 1.030   pH, UA 5.0 5.0 - 7.5   Color, UA Yellow Yellow   Appearance Ur Clear Clear   Leukocytes, UA Negative Negative   Protein, UA Trace (A) Negative/Trace   Glucose, UA Negative Negative   Ketones, UA Trace (A) Negative   RBC, UA Negative Negative   Bilirubin, UA Negative Negative   Urobilinogen, Ur 0.2 0.2 - 1.0 mg/dL   Nitrite, UA Negative Negative   Microscopic Examination See below:   BLADDER SCAN AMB NON-IMAGING  Result Value Ref Range   Scan Result 48     Lab Results  Component Value Date   WBC 12.1* 09/01/2014   HGB 11.2* 09/01/2014   HCT 36.0 09/01/2014   MCV 79* 09/01/2014   PLT 389 09/01/2014     Lab Results  Component Value Date   CREATININE 1.26 08/30/2014    No results found for: PSA  No results found for: TESTOSTERONE  No results found for: HGBA1C  Urinalysis No results found for:  COLORURINE, APPEARANCEUR, LABSPEC, PHURINE, GLUCOSEU, HGBUR, BILIRUBINUR, KETONESUR, PROTEINUR, UROBILINOGEN, NITRITE, LEUKOCYTESUR  Pertinent Imaging: CLINICAL DATA: History of kidney stones  EXAM: ABDOMEN - 1 VIEW  COMPARISON: 12/15/2014  FINDINGS: No radiopaque renal or ureteral calculi are visualized.  Nonobstructive bowel gas pattern.  Mild to moderate colonic stool burden.  Postsurgical changes at L4-S1.  No fracture or dislocation is seen.  IMPRESSION: No radiopaque renal or ureteral calculi are visualized.   Electronically Signed  By: Julian Hy M.D.  On: 03/05/2015 15:34  Assessment & Plan:    1. Incontinence:  Patient discontinue the Myrbetriq due to urinary retention. She does not want to try an anticholinergic because it side effects are also urinary retention.  We did discuss PTNS therapy and she is very interested in this. We will contact her insurance and see if they will pay for this treatment.  - Urinalysis, Complete - BLADDER SCAN AMB NON-IMAGING  2. Atrophic vaginitis:  Patient was encouraged to use the vaginal estrogen cream and instructed to apply 0.5mg  (pea-sized amount)  just inside the vaginal introitus with a finger-tip every night for two weeks and then Monday, Wednesday and Friday nights.  I explained to the patient that vaginally administered estrogen, which causes only a slight increase in the blood estrogen levels, have fewer contraindications and adverse systemic effects that oral HT.  She will return in 2 weeks for exam.  3. Microscopic hematuria:  Patient's catheter specimen does not have microscopic hematuria. She does not endorse gross hematuria and KUB was negative for stones.  We will continue to  monitor.    Return in about 2 weeks (around 03/19/2015) for exam.  Zara Council, [facility] [address] [place], [place] [address] [phone]

## 2015-03-19 ENCOUNTER — Encounter: Payer: Self-pay | Admitting: *Deleted

## 2015-03-22 ENCOUNTER — Ambulatory Visit
Admission: RE | Admit: 2015-03-22 | Discharge: 2015-03-22 | Disposition: A | Discharge status: Home / Self Care | Payer: Medicaid Other | Source: Ambulatory Visit | Attending: Family Medicine | Admitting: Family Medicine

## 2015-03-22 ENCOUNTER — Encounter: Payer: Self-pay | Admitting: Family Medicine

## 2015-03-22 ENCOUNTER — Ambulatory Visit (INDEPENDENT_AMBULATORY_CARE_PROVIDER_SITE_OTHER): Payer: Medicaid Other | Admitting: Family Medicine

## 2015-03-22 VITALS — BP 140/82 | HR 122 | Temp 98.6°F | Resp 18 | Ht 63.0 in | Wt 274.8 lb

## 2015-03-22 DIAGNOSIS — M25561 Pain in right knee: Secondary | ICD-10-CM | POA: Insufficient documentation

## 2015-03-22 DIAGNOSIS — R682 Dry mouth, unspecified: Secondary | ICD-10-CM | POA: Insufficient documentation

## 2015-03-22 NOTE — Progress Notes (Signed)
Name: Faith Padilla   MRN: [id number]    DOB: 1950-10-15   Date:03/22/2015       Progress Note  Subjective  Chief Complaint  Chief Complaint  Patient presents with  . Knee Pain    Right    Knee Pain  The incident occurred more than 1 week ago. The incident occurred at the gym [facility] There was no injury mechanism. The pain is present in the right knee. The quality of the pain is described as aching. The pain is at a severity of 0/10. The patient is experiencing no pain. The pain has been fluctuating since onset. Pertinent negatives include no inability to bear weight, loss of sensation, numbness or tingling. The symptoms are aggravated by movement (especially going up and downstairs). She has tried rest for the symptoms. The treatment provided moderate relief.  Pt. Also complains of tongue swelling, cracking, and dryness. She has noticed that when she eats certain foods containing acid (citrus fruits, spices, certain sauces), it flares up her tongue. Sometimes, she drinks milk to calm her tongue and tries to stay hydrated.She is currently taking an antibiotic for sinus infection.   Past Medical History  Diagnosis Date  . Depression   . Arthritis   . Asthma   . Glaucoma   . HLD (hyperlipidemia)   . Diverticulitis   . Sleep apnea   . Vaginal yeast infection   . Osteoarthritis     lumbar spine  . Acid reflux   . Microscopic hematuria   . Mixed incontinence   . Morbid obesity   . Kidney stones     Past Surgical History  Procedure Laterality Date  . Abdominal hysterectomy  1998  . Back surgery  1990/2014  . Cataract extraction, bilateral  2014  . Lithotripsy  2012  . Deviated nose septum surgery  2000    Family History  Problem Relation Age of Onset  . Heart disease Mother   . Diabetes Mother   . Cancer Father     lung cancer  . COPD Father   . Cancer Sister     Brain Cancer  . Kidney disease Mother   . Hematuria Daughter   .  Liver cancer Father     History   Social History  . Marital Status: Single    Spouse Name: N/A  . Number of Children: N/A  . Years of Education: N/A   Occupational History  . Not on file.   Social History Main Topics  . Smoking status: Never Smoker   . Smokeless tobacco: Not on file  . Alcohol Use: No  . Drug Use: No  . Sexual Activity: Not on file   Other Topics Concern  . Not on file   Social History Narrative     Current outpatient prescriptions:  .  albuterol (PROVENTIL HFA;VENTOLIN HFA) 108 (90 BASE) MCG/ACT inhaler, Inhale 180 mcg into the lungs. 2 puffs every 6 hours as needed, Disp: , Rfl:  .  atorvastatin (LIPITOR) 20 MG tablet, Take 20 mg by mouth daily. bedtime, Disp: , Rfl:  .  Bioflavonoid Products (VITAMIN C) CHEW, Chew by mouth daily., Disp: , Rfl:  .  budesonide-formoterol (SYMBICORT) 160-4.5 MCG/ACT inhaler, Inhale into the lungs 2 (two) times daily. 160-4.5, Disp: , Rfl:  .  cetirizine (ZYRTEC) 10 MG tablet, Take 1 tablet (10 mg total) by mouth at bedtime. Bedtime, Disp: 30 tablet, Rfl: 2 .  DULoxetine (CYMBALTA) 30 MG  capsule, Take 30 mg by mouth daily., Disp: , Rfl:  .  fluticasone (FLONASE) 50 MCG/ACT nasal spray, Place 50 mcg into the nose 2 (two) times daily. 2 sprays each nostril twice daily, Disp: , Rfl:  .  latanoprost (XALATAN) 0.005 % ophthalmic solution, Apply 0.005 drops to eye at bedtime., Disp: , Rfl:  .  meloxicam (MOBIC) 7.5 MG tablet, Take 7.5 mg by mouth daily., Disp: , Rfl:  .  montelukast (SINGULAIR) 10 MG tablet, Take 10 mg by mouth daily., Disp: , Rfl:  .  MULTIPLE VITAMIN PO, Take by mouth daily., Disp: , Rfl:  .  OMEGA-3 FATTY ACIDS PO, Take by mouth 2 (two) times daily., Disp: , Rfl:  .  omeprazole (PRILOSEC) 20 MG capsule, Take 1 capsule (20 mg total) by mouth 2 (two) times daily before a meal., Disp: 60 capsule, Rfl: 2 .  PATADAY 0.2 % SOLN, INSTILL 1 DROP IN BOTH EYES QD FOR ITCHY EYES, Disp: , Rfl: 5 .  PREMARIN vaginal cream,  , Disp: , Rfl: 3 .  Vitamin D, Ergocalciferol, (DRISDOL) 50000 UNITS CAPS capsule, TK 1 C PO ONCE WEEKLY, Disp: , Rfl: 4 .  MYRBETRIQ 25 MG TB24 tablet, TK 1 T PO QD, Disp: , Rfl: 11  Allergies  Allergen Reactions  . Morphine Hives     Review of Systems  Constitutional: Negative for fever, chills and weight loss.  HENT: Positive for congestion and sore throat.   Musculoskeletal: Positive for back pain and joint pain. Negative for falls and neck pain.  Neurological: Negative for tingling and numbness.      Objective  Filed Vitals:   03/22/15 1520  BP: 140/82  Pulse: 122  Temp: 98.6 F (37 C)  TempSrc: Oral  Resp: 18  Height: 5\' 3"  (1.6 m)  Weight: 274 lb 12.8 oz (124.648 kg)  SpO2: 94%    Physical Exam  Constitutional: She is oriented to person, place, and time and well-developed, well-nourished, and in no distress.  HENT:  Head: Normocephalic and atraumatic.  Mouth/Throat: Mucous membranes are dry.  Dry cracked tongue.  Cardiovascular: Normal rate and regular rhythm.   Pulmonary/Chest: Effort normal and breath sounds normal.  Musculoskeletal:       Right knee: She exhibits normal range of motion, no swelling, no erythema and normal alignment. Tenderness found. Medial joint line and lateral joint line tenderness noted.  Neurological: She is alert and oriented to person, place, and time.  Skin: Skin is warm and dry.  Psychiatric: Mood, memory, affect and judgment normal.  Nursing note and vitals reviewed.    Assessment & Plan 1. Acute pain of right knee Obtain x-ray of right knee for evaluation.patient is already taking oral NSAID therapy. Recheck in 1 week. - DG Knee Complete 4 Views Right; Future  2. Oral dryness Differential diagnosis for dry and cracked tongue includes, but is not limited to, oral irritation, dehydration, medication adverse effects. She will be referred to ENT for further evaluation. She is encouraged to drink fluids to keep her oral mucosa  hydrated. - Ambulatory referral to ENT    Stpehen Petitjean Asad A. [facility] 03/22/2015 3:35 PM

## 2015-03-26 ENCOUNTER — Ambulatory Visit (INDEPENDENT_AMBULATORY_CARE_PROVIDER_SITE_OTHER): Payer: Medicaid Other | Admitting: Urology

## 2015-03-26 ENCOUNTER — Encounter: Payer: Self-pay | Admitting: Urology

## 2015-03-26 VITALS — BP 117/69 | HR 51 | Resp 18 | Ht 63.0 in | Wt 272.9 lb

## 2015-03-26 DIAGNOSIS — B3731 Acute candidiasis of vulva and vagina: Secondary | ICD-10-CM | POA: Insufficient documentation

## 2015-03-26 DIAGNOSIS — B373 Candidiasis of vulva and vagina: Secondary | ICD-10-CM

## 2015-03-26 MED ORDER — FLUCONAZOLE 150 MG PO TABS: 150.0000 mg | ORAL_TABLET | Freq: Once | ORAL | Status: DC

## 2015-03-26 NOTE — Progress Notes (Signed)
03/26/2015 4:57 PM   Faith Padilla 06-Feb-1951 [id number]  Referring provider: Roselee Nova, MD [address] [place], [place] [address]  Chief Complaint  Patient presents with  . Urinary Incontinence  . Follow-up    Last ov - 03/05/15    HPI: Faith Padilla is a 64 year old white female with urinary incontinence and atrophic vaginitis who was prescribed vaginal estrogen cream at her visit 2 weeks ago. She presents today for symptom recheck and examination of her vaginal mucosa.  She has been on amoxicillin and prednisone recently for a sinusitis. She has since developed a vaginal yeast infection.  She has been using the vaginal estrogen cream as prescribed.   We have not heard back from the PTNS representative about whether the procedure is covered under her insurance.  PMH: Past Medical History  Diagnosis Date  . Glaucoma   . HLD (hyperlipidemia)   . Diverticulitis   . Sleep apnea   . Vaginal yeast infection   . Osteoarthritis     lumbar spine  . Acid reflux   . Microscopic hematuria   . Mixed incontinence   . Morbid obesity     Surgical History: Past Surgical History  Procedure Laterality Date  . Abdominal hysterectomy  1998  . Back surgery  1990/2014  . Cataract extraction, bilateral  2014  . Lithotripsy  2012  . Deviated nose septum surgery  2000    Home Medications:    Medication List       This list is accurate as of: 03/26/15  4:57 PM.  Always use your most recent med list.               albuterol 108 (90 BASE) MCG/ACT inhaler  Commonly known as:  PROVENTIL HFA;VENTOLIN HFA  Inhale 180 mcg into the lungs. 2 puffs every 6 hours as needed     atorvastatin 20 MG tablet  Commonly known as:  LIPITOR  Take 20 mg by mouth daily. bedtime     budesonide-formoterol 160-4.5 MCG/ACT inhaler  Commonly known as:  SYMBICORT  Inhale into the lungs 2 (two) times daily. 160-4.5     cetirizine 10 MG tablet  Commonly known as:  ZYRTEC    Take 1 tablet (10 mg total) by mouth at bedtime. Bedtime     DULoxetine 30 MG capsule  Commonly known as:  CYMBALTA  Take 30 mg by mouth daily.     fluticasone 50 MCG/ACT nasal spray  Commonly known as:  FLONASE  Place 50 mcg into the nose 2 (two) times daily. 2 sprays each nostril twice daily     latanoprost 0.005 % ophthalmic solution  Commonly known as:  XALATAN  Apply 0.005 drops to eye at bedtime.     meloxicam 7.5 MG tablet  Commonly known as:  MOBIC  Take 7.5 mg by mouth daily.     montelukast 10 MG tablet  Commonly known as:  SINGULAIR  Take 10 mg by mouth daily.     MULTIPLE VITAMIN PO  Take by mouth daily.     MYRBETRIQ 25 MG Tb24 tablet  Generic drug:  mirabegron ER  TK 1 T PO QD     OMEGA-3 FATTY ACIDS PO  Take by mouth 2 (two) times daily.     omeprazole 20 MG capsule  Commonly known as:  PRILOSEC  Take 1 capsule (20 mg total) by mouth 2 (two) times daily before a meal.     PATADAY 0.2 % Soln  Generic drug:  Olopatadine HCl  INSTILL 1 DROP IN BOTH EYES QD FOR ITCHY EYES     PREMARIN vaginal cream  Generic drug:  conjugated estrogens     Vitamin C Chew  Chew by mouth daily.     Vitamin D (Ergocalciferol) 50000 UNITS Caps capsule  Commonly known as:  DRISDOL  TK 1 C PO ONCE WEEKLY        Allergies:  Allergies  Allergen Reactions  . Morphine Hives    Family History: Family History  Problem Relation Age of Onset  . Heart disease Mother   . Diabetes Mother   . Cancer Father     lung cancer  . COPD Father   . Cancer Sister     Brain Cancer  . Kidney disease Mother   . Hematuria Daughter   . Liver cancer Father     Social History:  reports that she has never smoked. She does not have any smokeless tobacco history on file. She reports that she does not drink alcohol or use illicit drugs.  ROS: Urological Symptom Review  Patient is experiencing the following symptoms: Hard to postpone urination Leakage of urine   Review of  Systems  Gastrointestinal (upper) : Negative for upper GI symptoms  Gastrointestinal (lower) : Negative for lower GI symptoms  Constitutional : Negative for symptoms  Skin: Negative for skin symptoms  Eyes: Negative for eye symptoms  Ear/Nose/Throat : Negative for Ear/Nose/Throat symptoms  Hematologic/Lymphatic: Negative for Hematologic/Lymphatic symptoms  Cardiovascular : Negative for cardiovascular symptoms  Respiratory : Cough Shortness of breath  Endocrine: Excessive thirst  Musculoskeletal: Joint pain  Neurological: Negative for neurological symptoms  Psychologic: Negative for psychiatric symptoms   Physical Exam: BP 117/69 mmHg  Pulse 51  Resp 18  Ht 5\' 3"  (1.6 m)  Wt 272 lb 14.4 oz (123.787 kg)  BMI 48.35 kg/m2  GU: Atrophic external genitalia.  Normal urethral meatus. No urethral masses and/or tenderness. No bladder fullness or masses. No vaginal lesions or white, cheesy discharge. Normal rectal tone, no masses. Normal anus and perineum.   Laboratory Data: Lab Results  Component Value Date   WBC 12.1* 09/01/2014   HGB 11.2* 09/01/2014   HCT 36.0 09/01/2014   MCV 79* 09/01/2014   PLT 389 09/01/2014    Lab Results  Component Value Date   CREATININE 1.26 08/30/2014    No results found for: PSA  No results found for: TESTOSTERONE  No results found for: HGBA1C  Urinalysis    Component Value Date/Time   GLUCOSEU Negative 03/05/2015 1435   BILIRUBINUR Negative 03/05/2015 1435   NITRITE Negative 03/05/2015 1435   LEUKOCYTESUR Negative 03/05/2015 1435    Pertinent Imaging:  Assessment & Plan:    1. Yeast vaginitis:   I'll prescribe Diflucan 150 mg 1 tablet daily for 3 days. She is instructed not to take her Lipitor for those 3 days because of the potential interaction for increased rhabdomyolysis.  She will return in 2 weeks' time for reexamination of vaginal mucosa to ensure the yeast infection has resolved.  2. Incontinence:    We'll contact the PTNS representative to see if this procedure is covered under her insurance.  3. Atrophic vaginitis:   Patient will continue vaginal estrogen cream applied at Monday night's Wednesday nights and Friday nights.     There are no diagnoses linked to this encounter.  No Follow-up on file.  Zara Council, PA-C  [facility] [address] [place], [place] [address] [phone]  [phone]   

## 2015-03-29 ENCOUNTER — Ambulatory Visit (INDEPENDENT_AMBULATORY_CARE_PROVIDER_SITE_OTHER): Payer: Medicaid Other | Admitting: Family Medicine

## 2015-03-29 ENCOUNTER — Encounter: Payer: Self-pay | Admitting: Family Medicine

## 2015-03-29 ENCOUNTER — Encounter (INDEPENDENT_AMBULATORY_CARE_PROVIDER_SITE_OTHER): Payer: Self-pay

## 2015-03-29 VITALS — BP 118/75 | HR 126 | Temp 99.3°F | Resp 19 | Ht 63.0 in | Wt 273.9 lb

## 2015-03-29 DIAGNOSIS — M1711 Unilateral primary osteoarthritis, right knee: Secondary | ICD-10-CM | POA: Insufficient documentation

## 2015-03-29 MED ORDER — MELOXICAM 15 MG PO TABS: 15.0000 mg | ORAL_TABLET | Freq: Every day | ORAL | Status: DC

## 2015-03-29 NOTE — Progress Notes (Signed)
Name: Faith Padilla   MRN: [id number]    DOB: [birth year]   Date:03/29/2015       Progress Note  Subjective  Chief Complaint  Chief Complaint  Patient presents with  . Follow-up    1 wk f/u knee x-ray  . Hyperlipidemia    HPI   Knee Pain Pt. Is here for review of X ray results of right knee. She has sharp pain while going upstairs and downstairs, which has set in over the last few months. [facility] of [facility] frequently. X rays of knee show mild osteoarthritis but no other acute abnormality.   Past Medical History  Diagnosis Date  . Glaucoma   . HLD (hyperlipidemia)   . Diverticulitis   . Sleep apnea   . Vaginal yeast infection   . Osteoarthritis     lumbar spine  . Acid reflux   . Microscopic hematuria   . Mixed incontinence   . Morbid obesity     Past Surgical History  Procedure Laterality Date  . Abdominal hysterectomy  1998  . Back surgery  1990/2014  . Cataract extraction, bilateral  2014  . Lithotripsy  2012  . Deviated nose septum surgery  2000    Family History  Problem Relation Age of Onset  . Heart disease Mother   . Diabetes Mother   . Cancer Father     lung cancer  . COPD Father   . Cancer Sister     Brain Cancer  . Kidney disease Mother   . Hematuria Daughter   . Liver cancer Father     History   Social History  . Marital Status: Single    Spouse Name: N/A  . Number of Children: N/A  . Years of Education: N/A   Occupational History  . Not on file.   Social History Main Topics  . Smoking status: Never Smoker   . Smokeless tobacco: Not on file  . Alcohol Use: No  . Drug Use: No  . Sexual Activity: Not on file   Other Topics Concern  . Not on file   Social History Narrative     Current outpatient prescriptions:  .  albuterol (PROVENTIL HFA;VENTOLIN HFA) 108 (90 BASE) MCG/ACT inhaler, Inhale 180 mcg into the lungs. 2 puffs every 6 hours as needed, Disp: , Rfl:  .   atorvastatin (LIPITOR) 20 MG tablet, Take 20 mg by mouth daily. bedtime, Disp: , Rfl:  .  Bioflavonoid Products (VITAMIN C) CHEW, Chew by mouth daily., Disp: , Rfl:  .  budesonide-formoterol (SYMBICORT) 160-4.5 MCG/ACT inhaler, Inhale into the lungs 2 (two) times daily. 160-4.5, Disp: , Rfl:  .  cetirizine (ZYRTEC) 10 MG tablet, Take 1 tablet (10 mg total) by mouth at bedtime. Bedtime, Disp: 30 tablet, Rfl: 2 .  DULoxetine (CYMBALTA) 30 MG capsule, Take 30 mg by mouth daily., Disp: , Rfl:  .  fluconazole (DIFLUCAN) 150 MG tablet, Take 1 tablet (150 mg total) by mouth once., Disp: 3 tablet, Rfl: 0 .  fluticasone (FLONASE) 50 MCG/ACT nasal spray, Place 50 mcg into the nose 2 (two) times daily. 2 sprays each nostril twice daily, Disp: , Rfl:  .  latanoprost (XALATAN) 0.005 % ophthalmic solution, Apply 0.005 drops to eye at bedtime., Disp: , Rfl:  .  meloxicam (MOBIC) 7.5 MG tablet, Take 7.5 mg by mouth daily., Disp: , Rfl:  .  montelukast (SINGULAIR) 10 MG tablet, Take 10  mg by mouth daily., Disp: , Rfl:  .  MULTIPLE VITAMIN PO, Take by mouth daily., Disp: , Rfl:  .  MYRBETRIQ 25 MG TB24 tablet, TK 1 T PO QD, Disp: , Rfl: 11 .  OMEGA-3 FATTY ACIDS PO, Take by mouth 2 (two) times daily., Disp: , Rfl:  .  omeprazole (PRILOSEC) 20 MG capsule, Take 1 capsule (20 mg total) by mouth 2 (two) times daily before a meal., Disp: 60 capsule, Rfl: 2 .  PATADAY 0.2 % SOLN, INSTILL 1 DROP IN BOTH EYES QD FOR ITCHY EYES, Disp: , Rfl: 5 .  PREMARIN vaginal cream, , Disp: , Rfl: 3 .  Vitamin D, Ergocalciferol, (DRISDOL) 50000 UNITS CAPS capsule, TK 1 C PO ONCE WEEKLY, Disp: , Rfl: 4  Allergies  Allergen Reactions  . Morphine Hives     Review of Systems  Constitutional: Negative for fever, chills, weight loss and malaise/fatigue.  Musculoskeletal: Positive for back pain and joint pain.      Objective  Filed Vitals:   03/29/15 1657  BP: 118/75  Pulse: 126  Temp: 99.3 F (37.4 C)  TempSrc: Oral   Resp: 19  Height: 5\' 3"  (1.6 m)  Weight: 273 lb 14.4 oz (124.24 kg)  SpO2: 94%    Physical Exam  Constitutional: She is well-developed, well-nourished, and in no distress.  Musculoskeletal:       Right knee: She exhibits normal range of motion, no swelling, no effusion and no erythema. Tenderness found.  Nursing note and vitals reviewed.    Assessment & Plan 1. Primary osteoarthritis of right knee X-ray of the right knee is reviewed with patient in detail. We will increase Meloxicam to 15 mg daily for relief of pain. Patient to continue activity as tolerated. - meloxicam (MOBIC) 15 MG tablet; Take 1 tablet (15 mg total) by mouth daily.  Dispense: 30 tablet; Refill: 0    Cleopha Indelicato [facility] 03/29/2015 5:14 PM

## 2015-04-05 ENCOUNTER — Telehealth: Payer: Self-pay

## 2015-04-05 NOTE — Telephone Encounter (Signed)
Patient is calling she is burning with urination, thinks she may have a UTI what should she do? She has an appt with shannon on 04-12-15

## 2015-04-09 ENCOUNTER — Other Ambulatory Visit: Payer: Self-pay | Admitting: Family Medicine

## 2015-04-11 NOTE — Telephone Encounter (Signed)
Pt was called several times and each time hung up on. Pt will be seen in the office tomorrow.

## 2015-04-12 ENCOUNTER — Ambulatory Visit (INDEPENDENT_AMBULATORY_CARE_PROVIDER_SITE_OTHER): Payer: Medicaid Other | Admitting: Urology

## 2015-04-12 ENCOUNTER — Encounter: Payer: Self-pay | Admitting: Urology

## 2015-04-12 VITALS — BP 126/82 | HR 101 | Ht 62.0 in | Wt 274.6 lb

## 2015-04-12 DIAGNOSIS — N952 Postmenopausal atrophic vaginitis: Secondary | ICD-10-CM | POA: Diagnosis not present

## 2015-04-12 DIAGNOSIS — R32 Unspecified urinary incontinence: Secondary | ICD-10-CM | POA: Diagnosis not present

## 2015-04-12 DIAGNOSIS — B373 Candidiasis of vulva and vagina: Secondary | ICD-10-CM

## 2015-04-12 DIAGNOSIS — B3731 Acute candidiasis of vulva and vagina: Secondary | ICD-10-CM

## 2015-04-12 NOTE — Progress Notes (Signed)
11:54 PM   Faith Padilla Apr 04, 1951 [id number]  Referring provider: Roselee Nova, MD [address] [place], [place] [address]  Chief Complaint  Patient presents with  . Follow-up    yeast vaginitis    HPI: Mrs. Rhines is a 64 year old white female with urinary incontinence, yeast candidiasis and atrophic vaginitis who presents today for symptom recheck and examination of her vaginal mucosa.  Incontinence: Patient was given a trial of Myrbetriq for her urinary incontinence, but she went into urinary retention and could not continue the medication.  We discussed PTNS therapy, but her insurance will not cover the treatment.  Yeast vaginitis: She has been on amoxicillin and prednisone recently for a sinusitis. I prescribed Diflucan for a yeast infection that she develped and states the burning has resolved.  Atrophic vaginitis: She has been using the vaginal estrogen cream as prescribed.   PMH: Past Medical History  Diagnosis Date  . Glaucoma   . HLD (hyperlipidemia)   . Diverticulitis   . Sleep apnea   . Vaginal yeast infection   . Osteoarthritis     lumbar spine  . Acid reflux   . Microscopic hematuria   . Mixed incontinence   . Morbid obesity     Surgical History: Past Surgical History  Procedure Laterality Date  . Abdominal hysterectomy  1998  . Back surgery  1990/2014  . Cataract extraction, bilateral  2014  . Lithotripsy  2012  . Deviated nose septum surgery  2000    Home Medications:    Medication List       This list is accurate as of: 04/12/15 11:54 PM.  Always use your most recent med list.               albuterol 108 (90 BASE) MCG/ACT inhaler  Commonly known as:  PROVENTIL HFA;VENTOLIN HFA  Inhale 180 mcg into the lungs. 2 puffs every 6 hours as needed     atorvastatin 20 MG tablet  Commonly known as:  LIPITOR  Take 20 mg by mouth daily. bedtime     budesonide-formoterol 160-4.5 MCG/ACT inhaler  Commonly known as:   SYMBICORT  Inhale into the lungs 2 (two) times daily. 160-4.5     cetirizine 10 MG tablet  Commonly known as:  ZYRTEC  Take 1 tablet (10 mg total) by mouth at bedtime. Bedtime     DULoxetine 30 MG capsule  Commonly known as:  CYMBALTA  Take 30 mg by mouth daily.     fluconazole 150 MG tablet  Commonly known as:  DIFLUCAN  Take 1 tablet (150 mg total) by mouth once.     fluticasone 50 MCG/ACT nasal spray  Commonly known as:  FLONASE  Place 50 mcg into the nose 2 (two) times daily. 2 sprays each nostril twice daily     latanoprost 0.005 % ophthalmic solution  Commonly known as:  XALATAN  Apply 0.005 drops to eye at bedtime.     meloxicam 15 MG tablet  Commonly known as:  MOBIC  Take 1 tablet (15 mg total) by mouth daily.     montelukast 10 MG tablet  Commonly known as:  SINGULAIR  Take 10 mg by mouth daily.     MULTIPLE VITAMIN PO  Take by mouth daily.     MYRBETRIQ 25 MG Tb24 tablet  Generic drug:  mirabegron ER  TK 1 T PO QD     OMEGA-3 FATTY ACIDS PO  Take by mouth 2 (two) times  daily.     omeprazole 20 MG capsule  Commonly known as:  PRILOSEC  Take 1 capsule (20 mg total) by mouth 2 (two) times daily before a meal.     PATADAY 0.2 % Soln  Generic drug:  Olopatadine HCl  INSTILL 1 DROP IN BOTH EYES QD FOR ITCHY EYES     PREMARIN vaginal cream  Generic drug:  conjugated estrogens     Vitamin C Chew  Chew by mouth daily.     Vitamin D (Ergocalciferol) 50000 UNITS Caps capsule  Commonly known as:  DRISDOL  TK 1 C PO ONCE WEEKLY        Allergies:  Allergies  Allergen Reactions  . Morphine Hives    Family History: Family History  Problem Relation Age of Onset  . Heart disease Mother   . Diabetes Mother   . Cancer Father     lung cancer  . COPD Father   . Cancer Sister     Brain Cancer  . Kidney disease Mother   . Hematuria Daughter   . Liver cancer Father     Social History:  reports that she has never smoked. She does not have any  smokeless tobacco history on file. She reports that she does not drink alcohol or use illicit drugs.  ROS: Urological Symptom Review  Patient is experiencing the following symptoms: Hard to postpone urination Leakage of urine   Review of Systems  Gastrointestinal (upper) : Negative for upper GI symptoms  Gastrointestinal (lower) : Negative for lower GI symptoms  Constitutional : Negative for symptoms  Skin: Negative for skin symptoms  Eyes: Negative for eye symptoms  Ear/Nose/Throat : Negative for Ear/Nose/Throat symptoms  Hematologic/Lymphatic: Negative for Hematologic/Lymphatic symptoms  Cardiovascular : Negative for cardiovascular symptoms  Respiratory : Cough Shortness of breath  Endocrine: Excessive thirst  Musculoskeletal: Joint pain  Neurological: Negative for neurological symptoms  Psychologic: Negative for psychiatric symptoms   Physical Exam: BP 126/82 mmHg  Pulse 101  Ht 5\' 2"  (1.575 m)  Wt 274 lb 9.6 oz (124.558 kg)  BMI 50.21 kg/m2  GU: Atrophic external genitalia.  Normal urethral meatus. No urethral masses and/or tenderness. No bladder fullness or masses. No vaginal lesions or white, cheesy discharge. Normal rectal tone, no masses. Normal anus and perineum.   Laboratory Data: Results for orders placed or performed in visit on 03/05/15  Microscopic Examination  Result Value Ref Range   WBC, UA 0-5 0 -  5 /hpf   RBC, UA 0-2 0 -  2 /hpf   Epithelial Cells (non renal) 0-10 0 - 10 /hpf   Renal Epithel, UA 0-10 (A) None seen /hpf   Crystals Present (A) N/A   Crystal Type Calcium Oxalate N/A   Bacteria, UA None seen None seen/Few  Urinalysis, Complete  Result Value Ref Range   Specific Gravity, UA 1.025 1.005 - 1.030   pH, UA 5.0 5.0 - 7.5   Color, UA Yellow Yellow   Appearance Ur Clear Clear   Leukocytes, UA Negative Negative   Protein, UA Trace (A) Negative/Trace   Glucose, UA Negative Negative   Ketones, UA Trace (A)  Negative   RBC, UA Negative Negative   Bilirubin, UA Negative Negative   Urobilinogen, Ur 0.2 0.2 - 1.0 mg/dL   Nitrite, UA Negative Negative   Microscopic Examination See below:   BLADDER SCAN AMB NON-IMAGING  Result Value Ref Range   Scan Result 48     Lab Results  Component Value Date  WBC 12.1* 09/01/2014   HGB 11.2* 09/01/2014   HCT 36.0 09/01/2014   MCV 79* 09/01/2014   PLT 389 09/01/2014    Lab Results  Component Value Date   CREATININE 1.26 08/30/2014    No results found for: PSA  No results found for: TESTOSTERONE  No results found for: HGBA1C  Urinalysis    Component Value Date/Time   COLORURINE Yellow 07/24/2014 [place] 07/24/2014 1519   LABSPEC 1.014 07/24/2014 1519   PHURINE 6.0 07/24/2014 1519   GLUCOSEU Negative 03/05/2015 1435   GLUCOSEU Negative 07/24/2014 1519   HGBUR Negative 07/24/2014 1519   BILIRUBINUR Negative 03/05/2015 1435   BILIRUBINUR Negative 07/24/2014 1519   KETONESUR Negative 07/24/2014 1519   PROTEINUR Negative 07/24/2014 1519   NITRITE Negative 03/05/2015 1435   NITRITE Negative 07/24/2014 1519   LEUKOCYTESUR Negative 03/05/2015 1435   LEUKOCYTESUR 2+ 07/24/2014 1519    Pertinent Imaging:  Assessment & Plan:    1. Incontinence:   Patient's insurance will not cover PTNS.  I have given her Vesicare samples, #28.  I will have her return to the clinic in 3 weeks for a PVR.   I have advised her of the side effects of Vesicare, such as: dry eyes, dry mouth, constipation, mental confusion and/or urinary retention.  2. Yeast vaginitis:   Resolved.  3. Atrophic vaginitis:   Patient will continue vaginal estrogen cream applying the cream on Monday night's Wednesday nights and Friday nights.     There are no diagnoses linked to this encounter.  Return in about 3 weeks (around 05/03/2015) for PVR and OV.  Zara Council, [facility] [address] [place], [place]  [address] [phone]

## 2015-04-13 LAB — URINALYSIS, COMPLETE
Glucose, UA: NEGATIVE
Ketones, UA: NEGATIVE
Nitrite, UA: NEGATIVE
Protein, UA: NEGATIVE
RBC UA: NEGATIVE
Specific Gravity, UA: 1.025 (ref 1.005–1.030)
Urobilinogen, Ur: 0.2 mg/dL (ref 0.2–1.0)
pH, UA: 5.5 (ref 5.0–7.5)

## 2015-04-13 LAB — MICROSCOPIC EXAMINATION

## 2015-05-02 ENCOUNTER — Encounter: Payer: Self-pay | Admitting: Family Medicine

## 2015-05-02 ENCOUNTER — Ambulatory Visit (INDEPENDENT_AMBULATORY_CARE_PROVIDER_SITE_OTHER): Payer: Medicaid Other | Admitting: Family Medicine

## 2015-05-02 VITALS — BP 129/81 | HR 126 | Temp 99.0°F | Resp 20 | Ht 62.0 in | Wt 274.0 lb

## 2015-05-02 DIAGNOSIS — M1711 Unilateral primary osteoarthritis, right knee: Secondary | ICD-10-CM | POA: Diagnosis not present

## 2015-05-03 ENCOUNTER — Encounter: Payer: Self-pay | Admitting: Urology

## 2015-05-03 ENCOUNTER — Ambulatory Visit (INDEPENDENT_AMBULATORY_CARE_PROVIDER_SITE_OTHER): Payer: Medicaid Other | Admitting: Urology

## 2015-05-03 VITALS — BP 130/80 | HR 89 | Ht 63.0 in | Wt 273.8 lb

## 2015-05-03 DIAGNOSIS — N3941 Urge incontinence: Secondary | ICD-10-CM | POA: Diagnosis not present

## 2015-05-03 LAB — BLADDER SCAN AMB NON-IMAGING: SCAN RESULT: 78

## 2015-05-03 MED ORDER — MELOXICAM 15 MG PO TABS: 15.0000 mg | ORAL_TABLET | Freq: Every day | ORAL | Status: DC

## 2015-05-03 NOTE — Progress Notes (Signed)
Name: Faith Padilla   MRN: [id number]    DOB: 02-16-1951   Date:05/03/2015       Progress Note  Subjective  Chief Complaint  Chief Complaint  Patient presents with  . Follow-up    4 wk. Evaluation of Medication  . Hyperlipidemia    HPI Patient is here for follow-up of bilateral knee pain. X-rays obtained in July 2016 showed mild osteoarthritis of the right knee. Patient is currently on meloxicam, which was increased to 15 mg daily at the same time. She reports significant improvement in pain and stiffness of the knees with the increased dose of meloxicam.  Past Medical History  Diagnosis Date  . Glaucoma   . HLD (hyperlipidemia)   . Diverticulitis   . Sleep apnea   . Vaginal yeast infection   . Osteoarthritis     lumbar spine  . Acid reflux   . Microscopic hematuria   . Mixed incontinence   . Morbid obesity     Past Surgical History  Procedure Laterality Date  . Abdominal hysterectomy  1998  . Back surgery  1990/2014  . Cataract extraction, bilateral  2014  . Lithotripsy  2012  . Deviated nose septum surgery  2000    Family History  Problem Relation Age of Onset  . Heart disease Mother   . Diabetes Mother   . Cancer Father     lung cancer  . COPD Father   . Cancer Sister     Brain Cancer  . Kidney disease Mother   . Hematuria Daughter   . Liver cancer Father     Social History   Social History  . Marital Status: Single    Spouse Name: N/A  . Number of Children: N/A  . Years of Education: N/A   Occupational History  . Not on file.   Social History Main Topics  . Smoking status: Never Smoker   . Smokeless tobacco: Not on file  . Alcohol Use: No  . Drug Use: No  . Sexual Activity: Not on file   Other Topics Concern  . Not on file   Social History Narrative     Current outpatient prescriptions:  .  albuterol (PROVENTIL HFA;VENTOLIN HFA) 108 (90 BASE) MCG/ACT inhaler, Inhale 180 mcg into the lungs. 2 puffs every 6 hours as needed, Disp: ,  Rfl:  .  atorvastatin (LIPITOR) 20 MG tablet, Take 20 mg by mouth daily. bedtime, Disp: , Rfl:  .  Bioflavonoid Products (VITAMIN C) CHEW, Chew by mouth daily., Disp: , Rfl:  .  budesonide-formoterol (SYMBICORT) 160-4.5 MCG/ACT inhaler, Inhale into the lungs 2 (two) times daily. 160-4.5, Disp: , Rfl:  .  cetirizine (ZYRTEC) 10 MG tablet, Take 1 tablet (10 mg total) by mouth at bedtime. Bedtime, Disp: 30 tablet, Rfl: 2 .  DULoxetine (CYMBALTA) 30 MG capsule, Take 30 mg by mouth daily., Disp: , Rfl:  .  fluconazole (DIFLUCAN) 150 MG tablet, Take 1 tablet (150 mg total) by mouth once. (Patient not taking: Reported on 05/03/2015), Disp: 3 tablet, Rfl: 0 .  fluticasone (FLONASE) 50 MCG/ACT nasal spray, Place 50 mcg into the nose 2 (two) times daily. 2 sprays each nostril twice daily, Disp: , Rfl:  .  latanoprost (XALATAN) 0.005 % ophthalmic solution, Apply 0.005 drops to eye at bedtime., Disp: , Rfl:  .  meloxicam (MOBIC) 15 MG tablet, Take 1 tablet (15 mg total) by mouth daily., Disp: 30 tablet, Rfl: 0 .  montelukast (SINGULAIR) 10 MG tablet, Take 10  mg by mouth daily., Disp: , Rfl:  .  MULTIPLE VITAMIN PO, Take by mouth daily., Disp: , Rfl:  .  OMEGA-3 FATTY ACIDS PO, Take by mouth 2 (two) times daily., Disp: , Rfl:  .  omeprazole (PRILOSEC) 20 MG capsule, Take 1 capsule (20 mg total) by mouth 2 (two) times daily before a meal., Disp: 60 capsule, Rfl: 2 .  PATADAY 0.2 % SOLN, INSTILL 1 DROP IN BOTH EYES QD FOR ITCHY EYES, Disp: , Rfl: 5 .  PREMARIN vaginal cream, , Disp: , Rfl: 3 .  Vitamin D, Ergocalciferol, (DRISDOL) 50000 UNITS CAPS capsule, TK 1 C PO ONCE WEEKLY, Disp: , Rfl: 4 .  solifenacin (VESICARE) 5 MG tablet, Take 5 mg by mouth daily., Disp: , Rfl:   Allergies  Allergen Reactions  . Morphine Hives     Review of Systems  Musculoskeletal: Positive for joint pain.    Objective  Filed Vitals:   05/02/15 1508  BP: 129/81  Pulse: 126  Temp: 99 F (37.2 C)  TempSrc: Oral  Resp:  20  Height: 5\' 2"  (1.575 m)  Weight: 274 lb (124.286 kg)  SpO2: 94%    Physical Exam  Constitutional: She is well-developed, well-nourished, and in no distress.  Cardiovascular: Normal rate.   Pulmonary/Chest: Effort normal and breath sounds normal.  Musculoskeletal:       Right knee: She exhibits no swelling and no deformity. Tenderness found. Medial joint line tenderness noted.       Left knee: Tenderness found.       Legs: Mild crepitus in the right knee.  Nursing note and vitals reviewed.   Assessment & Plan  1. Primary osteoarthritis of right knee Sponsor to daily NSAID therapy. Patient encouraged to continue activity as tolerated. Refill for Mobic is sent to pharmacy.  - meloxicam (MOBIC) 15 MG tablet; Take 1 tablet (15 mg total) by mouth daily.  Dispense: 90 tablet; Refill: 0   Tanikka Bresnan [facility] 05/03/2015 5:44 PM

## 2015-05-09 ENCOUNTER — Telehealth: Payer: Self-pay | Admitting: Family Medicine

## 2015-05-09 MED ORDER — ATORVASTATIN CALCIUM 20 MG PO TABS: 20.0000 mg | ORAL_TABLET | Freq: Every day | ORAL | Status: DC

## 2015-05-09 NOTE — Telephone Encounter (Signed)
Medication has been refilled and sent to [facility] [address] 

## 2015-05-14 NOTE — Progress Notes (Signed)
7:21 PM   Faith Padilla 26-Jul-1951 [id number]  Referring provider: Roselee Nova, MD [address] [place], [place] [address]  Chief Complaint  Patient presents with  . Urinary Incontinence    f/u visit pt was given Vesicare at last visit     HPI: Patient is a 64 year old white female who was given Vesicare 5 mg daily samples for her urinary incontinence.  She has found the Vesicare samples effective in managing her incontinence. She has not had any untoward side effects. She would like to continue the medication. Her PVR is minimal on today's exam at 78 mL.  PMH: Past Medical History  Diagnosis Date  . Glaucoma   . HLD (hyperlipidemia)   . Diverticulitis   . Sleep apnea   . Vaginal yeast infection   . Osteoarthritis     lumbar spine  . Acid reflux   . Microscopic hematuria   . Mixed incontinence   . Morbid obesity     Surgical History: Past Surgical History  Procedure Laterality Date  . Abdominal hysterectomy  1998  . Back surgery  1990/2014  . Cataract extraction, bilateral  2014  . Lithotripsy  2012  . Deviated nose septum surgery  2000    Home Medications:    Medication List       This list is accurate as of: 05/03/15 11:59 PM.  Always use your most recent med list.               albuterol 108 (90 BASE) MCG/ACT inhaler  Commonly known as:  PROVENTIL HFA;VENTOLIN HFA  Inhale 180 mcg into the lungs. 2 puffs every 6 hours as needed     atorvastatin 20 MG tablet  Commonly known as:  LIPITOR  Take 20 mg by mouth daily. bedtime     budesonide-formoterol 160-4.5 MCG/ACT inhaler  Commonly known as:  SYMBICORT  Inhale into the lungs 2 (two) times daily. 160-4.5     cetirizine 10 MG tablet  Commonly known as:  ZYRTEC  Take 1 tablet (10 mg total) by mouth at bedtime. Bedtime     DULoxetine 30 MG capsule  Commonly known as:  CYMBALTA  Take 30 mg by mouth daily.     fluconazole 150 MG tablet  Commonly known as:  DIFLUCAN  Take 1  tablet (150 mg total) by mouth once.     fluticasone 50 MCG/ACT nasal spray  Commonly known as:  FLONASE  Place 50 mcg into the nose 2 (two) times daily. 2 sprays each nostril twice daily     latanoprost 0.005 % ophthalmic solution  Commonly known as:  XALATAN  Apply 0.005 drops to eye at bedtime.     meloxicam 15 MG tablet  Commonly known as:  MOBIC  Take 1 tablet (15 mg total) by mouth daily.     montelukast 10 MG tablet  Commonly known as:  SINGULAIR  Take 10 mg by mouth daily.     MULTIPLE VITAMIN PO  Take by mouth daily.     OMEGA-3 FATTY ACIDS PO  Take by mouth 2 (two) times daily.     omeprazole 20 MG capsule  Commonly known as:  PRILOSEC  Take 1 capsule (20 mg total) by mouth 2 (two) times daily before a meal.     PATADAY 0.2 % Soln  Generic drug:  Olopatadine HCl  INSTILL 1 DROP IN BOTH EYES QD FOR ITCHY EYES     PREMARIN vaginal cream  Generic drug:  conjugated estrogens     solifenacin 5 MG tablet  Commonly known as:  VESICARE  Take 5 mg by mouth daily.     Vitamin C Chew  Chew by mouth daily.     Vitamin D (Ergocalciferol) 50000 UNITS Caps capsule  Commonly known as:  DRISDOL  TK 1 C PO ONCE WEEKLY        Allergies:  Allergies  Allergen Reactions  . Morphine Hives    Family History: Family History  Problem Relation Age of Onset  . Heart disease Mother   . Diabetes Mother   . Cancer Father     lung cancer  . COPD Father   . Cancer Sister     Brain Cancer  . Kidney disease Mother   . Hematuria Daughter   . Liver cancer Father     Social History:  reports that she has never smoked. She does not have any smokeless tobacco history on file. She reports that she does not drink alcohol or use illicit drugs.  ROS: Urological Symptom Review  Patient is experiencing the following symptoms: Hard to postpone urination Leakage of urine   Review of Systems  Gastrointestinal (upper) : Negative for upper GI symptoms  Gastrointestinal  (lower) : Negative for lower GI symptoms  Constitutional : Negative for symptoms  Skin: Negative for skin symptoms  Eyes: Negative for eye symptoms  Ear/Nose/Throat : Negative for Ear/Nose/Throat symptoms  Hematologic/Lymphatic: Negative for Hematologic/Lymphatic symptoms  Cardiovascular : Negative for cardiovascular symptoms  Respiratory : Cough Shortness of breath  Endocrine: Excessive thirst  Musculoskeletal: Joint pain  Neurological: Negative for neurological symptoms  Psychologic: Negative for psychiatric symptoms   Physical Exam: Blood pressure 130/80, pulse 89, height 5\' 3"  (1.6 m), weight 273 lb 12.8 oz (124.195 kg).  Pertinent Imaging: Results for orders placed or performed in visit on 05/03/15  Bladder Scan (Post Void Residual) in office  Result Value Ref Range   Scan Result 78    Assessment & Plan:    1. Incontinence:   Patient has noticed significant improvement since starting the Vesicare 5 mg daily. I have given her samples (#28) and sent a prescription to her pharmacy. The samples are to ensure the continuity of therapy if her insurance requires prior authorization for the Vesicare. She will return in one year's time for symptom recheck and PVR.  2. Atrophic vaginitis:   Patient will continue vaginal estrogen cream applying the cream on Monday night's Wednesday nights and Friday nights.     There are no diagnoses linked to this encounter.  Return in about 1 year (around 05/02/2016) for PVR.  Zara Council, [facility] [address] [place], [place] [address] [phone]

## 2015-05-31 ENCOUNTER — Ambulatory Visit (INDEPENDENT_AMBULATORY_CARE_PROVIDER_SITE_OTHER): Payer: Medicaid Other | Admitting: Family Medicine

## 2015-05-31 ENCOUNTER — Encounter: Payer: Self-pay | Admitting: Family Medicine

## 2015-05-31 DIAGNOSIS — L309 Dermatitis, unspecified: Secondary | ICD-10-CM

## 2015-05-31 DIAGNOSIS — L719 Rosacea, unspecified: Secondary | ICD-10-CM | POA: Diagnosis not present

## 2015-05-31 DIAGNOSIS — R21 Rash and other nonspecific skin eruption: Secondary | ICD-10-CM | POA: Insufficient documentation

## 2015-05-31 MED ORDER — TRIAMCINOLONE ACETONIDE 0.1 % EX OINT: 1.0000 "application " | TOPICAL_OINTMENT | Freq: Every day | CUTANEOUS | Status: DC

## 2015-05-31 MED ORDER — DOXYCYCLINE HYCLATE 100 MG PO TABS: 100.0000 mg | ORAL_TABLET | Freq: Every day | ORAL | Status: DC

## 2015-05-31 MED ORDER — HYDROXYZINE HCL 25 MG PO TABS: 25.0000 mg | ORAL_TABLET | Freq: Three times a day (TID) | ORAL | Status: DC | PRN

## 2015-05-31 NOTE — Progress Notes (Signed)
Name: Faith Padilla   MRN: [id number]    DOB: 12/13/1950   Date:05/31/2015       Progress Note  Subjective  Chief Complaint  Chief Complaint  Patient presents with  . Rash    pt states that she has rash on her arms and is constantly itching all over.    Rash This is a chronic problem. The current episode started more than 1 month ago. The affected locations include the left arm, right arm and face. The rash is characterized by itchiness. She was exposed to nothing. Associated symptoms include fatigue and joint pain (chronic knee pain). Pertinent negatives include no fever. Past treatments include anti-itch cream. The treatment provided mild relief. Her past medical history is significant for asthma. There is no history of eczema.  Rash on her face appeared yesterday and has now spread to around her eyes and the forehead. It is associated with a flushing sensation in her face. She was recently started on prednisone by pulmonologist and patient thinks that may have triggered onset of rash.  Past Medical History  Diagnosis Date  . Glaucoma   . HLD (hyperlipidemia)   . Diverticulitis   . Sleep apnea   . Vaginal yeast infection   . Osteoarthritis     lumbar spine  . Acid reflux   . Microscopic hematuria   . Mixed incontinence   . Morbid obesity     Past Surgical History  Procedure Laterality Date  . Abdominal hysterectomy  1998  . Back surgery  1990/2014  . Cataract extraction, bilateral  2014  . Lithotripsy  2012  . Deviated nose septum surgery  2000    Family History  Problem Relation Age of Onset  . Heart disease Mother   . Diabetes Mother   . Cancer Father     lung cancer  . COPD Father   . Cancer Sister     Brain Cancer  . Kidney disease Mother   . Hematuria Daughter   . Liver cancer Father     Social History   Social History  . Marital Status: Single    Spouse Name: N/A  . Number of Children: N/A  . Years of Education: N/A   Occupational History  . Not  on file.   Social History Main Topics  . Smoking status: Never Smoker   . Smokeless tobacco: Not on file  . Alcohol Use: No  . Drug Use: No  . Sexual Activity: Not on file   Other Topics Concern  . Not on file   Social History Narrative    Current outpatient prescriptions:  .  albuterol (PROVENTIL HFA;VENTOLIN HFA) 108 (90 BASE) MCG/ACT inhaler, Inhale 180 mcg into the lungs. 2 puffs every 6 hours as needed, Disp: , Rfl:  .  atorvastatin (LIPITOR) 20 MG tablet, Take 1 tablet (20 mg total) by mouth daily. bedtime, Disp: 30 tablet, Rfl: 1 .  Bioflavonoid Products (VITAMIN C) CHEW, Chew by mouth daily., Disp: , Rfl:  .  budesonide-formoterol (SYMBICORT) 160-4.5 MCG/ACT inhaler, Inhale into the lungs 2 (two) times daily. 160-4.5, Disp: , Rfl:  .  cetirizine (ZYRTEC) 10 MG tablet, Take 1 tablet (10 mg total) by mouth at bedtime. Bedtime, Disp: 30 tablet, Rfl: 2 .  DULoxetine (CYMBALTA) 30 MG capsule, Take 30 mg by mouth daily., Disp: , Rfl:  .  fluconazole (DIFLUCAN) 150 MG tablet, Take 1 tablet (150 mg total) by mouth once., Disp: 3 tablet, Rfl: 0 .  fluticasone (FLONASE) 50 MCG/ACT  nasal spray, Place 50 mcg into the nose 2 (two) times daily. 2 sprays each nostril twice daily, Disp: , Rfl:  .  latanoprost (XALATAN) 0.005 % ophthalmic solution, Apply 0.005 drops to eye at bedtime., Disp: , Rfl:  .  meloxicam (MOBIC) 15 MG tablet, Take 1 tablet (15 mg total) by mouth daily., Disp: 90 tablet, Rfl: 0 .  montelukast (SINGULAIR) 10 MG tablet, Take 10 mg by mouth daily., Disp: , Rfl:  .  MULTIPLE VITAMIN PO, Take by mouth daily., Disp: , Rfl:  .  OMEGA-3 FATTY ACIDS PO, Take by mouth 2 (two) times daily., Disp: , Rfl:  .  omeprazole (PRILOSEC) 20 MG capsule, Take 1 capsule (20 mg total) by mouth 2 (two) times daily before a meal., Disp: 60 capsule, Rfl: 2 .  PATADAY 0.2 % SOLN, INSTILL 1 DROP IN BOTH EYES QD FOR ITCHY EYES, Disp: , Rfl: 5 .  PREMARIN vaginal cream, , Disp: , Rfl: 3 .   solifenacin (VESICARE) 5 MG tablet, Take 5 mg by mouth daily., Disp: , Rfl:  .  Vitamin D, Ergocalciferol, (DRISDOL) 50000 UNITS CAPS capsule, TK 1 C PO ONCE WEEKLY, Disp: , Rfl: 4  Allergies  Allergen Reactions  . Morphine Hives     Review of Systems  Constitutional: Positive for fatigue. Negative for fever.  Musculoskeletal: Positive for joint pain (chronic knee pain).  Skin: Positive for rash.    Objective  Filed Vitals:   05/31/15 1004  BP: 122/68  Pulse: 127  Temp: 98.1 F (36.7 C)  Resp: 16  Height: 5\' 3"  (1.6 m)  Weight: 270 lb 7 oz (122.67 kg)  SpO2: 94%    Physical Exam  Constitutional: She is well-developed, well-nourished, and in no distress.  Eyes: Conjunctivae are normal.  Skin:  Macular, pruritic, scaly lesion over the right proximal arm. Macular, erythematous, non-pruritic rash over the left side of face, forehead and around the eyes.  Nursing note and vitals reviewed.   Assessment & Plan  1. Dermatitis The rash on her right arm is consistent with dermatitis. We will prescribe topical corticosteroid cream to be applied daily. Prescribed Atarax for relief of pruritus  - triamcinolone ointment (KENALOG) 0.1 %; Apply 1 application topically daily.  Dispense: 30 g; Refill: 0 - hydrOXYzine (ATARAX/VISTARIL) 25 MG tablet; Take 1 tablet (25 mg total) by mouth 3 (three) times daily as needed.  Dispense: 30 tablet; Refill: 0  2. Rosacea Presentation consistent with rosacea, which patient apparently has history of in the past. We will start on 7 day course of doxycycline and refer to dermatology for further follow-up. - Ambulatory referral to Dermatology - doxycycline (VIBRA-TABS) 100 MG tablet; Take 1 tablet (100 mg total) by mouth daily.  Dispense: 7 tablet; Refill: 0   Syed Asad A. [facility] 05/31/2015 10:12 AM

## 2015-06-05 ENCOUNTER — Telehealth: Payer: Self-pay | Admitting: Family Medicine

## 2015-06-05 DIAGNOSIS — K219 Gastro-esophageal reflux disease without esophagitis: Secondary | ICD-10-CM

## 2015-06-05 NOTE — Telephone Encounter (Signed)
Pt needs refill on Omeprazole to be sent to [facility] on [address]

## 2015-06-06 MED ORDER — OMEPRAZOLE 20 MG PO CPDR: 20.0000 mg | DELAYED_RELEASE_CAPSULE | Freq: Two times a day (BID) | ORAL | Status: DC

## 2015-06-06 NOTE — Telephone Encounter (Signed)
Omeprazole 20 mg has been refilled and sent to [facility] [address]

## 2015-06-07 ENCOUNTER — Other Ambulatory Visit: Payer: Self-pay | Admitting: Pediatrics

## 2015-06-11 ENCOUNTER — Other Ambulatory Visit: Payer: Self-pay | Admitting: Pediatrics

## 2015-06-11 NOTE — Telephone Encounter (Signed)
E-refilled cetirizine 10 mg with 3 refills, since per paper chart, her next OV is due for December 2016.

## 2015-06-18 ENCOUNTER — Ambulatory Visit (INDEPENDENT_AMBULATORY_CARE_PROVIDER_SITE_OTHER): Payer: Medicaid Other | Admitting: Pediatrics

## 2015-06-18 ENCOUNTER — Encounter: Payer: Self-pay | Admitting: Pediatrics

## 2015-06-18 DIAGNOSIS — J4540 Moderate persistent asthma, uncomplicated: Secondary | ICD-10-CM

## 2015-06-18 DIAGNOSIS — B370 Candidal stomatitis: Secondary | ICD-10-CM

## 2015-06-18 MED ORDER — FLUCONAZOLE 150 MG PO TABS: 150.0000 mg | ORAL_TABLET | Freq: Once | ORAL | Status: DC

## 2015-06-18 NOTE — Progress Notes (Signed)
FOLLOW UP NOTE  RE: Faith Padilla MRN: [id number] DOB: [birth year] [facility] [place] [address] [place] [id number] Date of Office Visit: 06/18/2015  Assessment Chief Complaint: Follow-up   HPI The patient has been improved since the last visit. Occasionally she has some coughing and slight wheezing. She is concerned about her tongue being irritated. Drug Allergies:  Allergies  Allergen Reactions  . Morphine Hives    Physical Exam: BP 116/80 mmHg  Pulse 108  Temp(Src) 98 F (36.7 C)  Resp 20  Physical Exam  Constitutional: She appears well-developed and well-nourished.  HENT:  Right Ear: Tympanic membrane normal.  Left Ear: Tympanic membrane normal.  Nose: Nose normal.  Mouth/Throat: Dry mucous membranes: her tongue looked dry. She had mild erythema and some furrowing.  Eyes: Conjunctivae are normal.  Neck: Neck supple.  Cardiovascular: Normal rate, regular rhythm and normal heart sounds.   No murmur heard. Pulmonary/Chest: Effort normal and breath sounds normal.  Clear to percussion and auscultation  Lymphadenopathy:    She has no cervical adenopathy.  Skin:  Clear  Psychiatric: She has a normal mood and affect.    Diagnostics:   Forced vital capacity 2.50 L FEV1 2.14 L predicted forced vital capacity 2.82 L predicted FEV1 2.25 L-the spirometry is in the normal range Assessment and Plan: 1. Moderate persistent asthma, uncomplicated   2. Thrush    Meds ordered this encounter  Medications  . fluconazole (DIFLUCAN) 150 MG tablet    Sig: Take 1 tablet (150 mg total) by mouth once. May repeat once 3 days later    Dispense:  2 tablet    Refill:  0   Patient Instructions  Symbicort 160 2 puffs twice a day. She will then rinse gargle and spit out after use. Montelukast 10 mg once a day. Cetirizine 10 mg once a day for runny nose. Fluticasone 2 sprays per nostril once a day for stuffy  nose. Proventil 2 puffs every 4 hours if needed for coughing or wheezing. She should have a flu vaccination Diflucan 150 mg today. She may repeat this dose once 3 days later Follow-up in 2 months   Return in about 6 months (around 12/17/2015).    Thank you for the opportunity to care for this patient.  Please do not hesitate to contact me with questions.  [facility] [address] [place], [place] [address] [phone]  J. Charyl Bigger, M.D.

## 2015-06-18 NOTE — Patient Instructions (Addendum)
Symbicort 160 2 puffs twice a day. She will then rinse gargle and spit out after use. Montelukast 10 mg once a day. Cetirizine 10 mg once a day for runny nose. Fluticasone 2 sprays per nostril once a day for stuffy nose. Proventil 2 puffs every 4 hours if needed for coughing or wheezing. She should have a flu vaccination Diflucan 150 mg today. She may repeat this dose once 3 days later Follow-up in 2 months

## 2015-07-11 ENCOUNTER — Other Ambulatory Visit: Payer: Self-pay | Admitting: Allergy and Immunology

## 2015-07-18 ENCOUNTER — Encounter: Payer: Self-pay | Admitting: Allergy and Immunology

## 2015-07-18 ENCOUNTER — Ambulatory Visit (INDEPENDENT_AMBULATORY_CARE_PROVIDER_SITE_OTHER): Payer: Medicaid Other | Admitting: Allergy and Immunology

## 2015-07-18 VITALS — BP 132/90 | HR 76 | Temp 98.2°F | Resp 20

## 2015-07-18 DIAGNOSIS — J019 Acute sinusitis, unspecified: Secondary | ICD-10-CM | POA: Insufficient documentation

## 2015-07-18 DIAGNOSIS — J0100 Acute maxillary sinusitis, unspecified: Secondary | ICD-10-CM

## 2015-07-18 DIAGNOSIS — J4540 Moderate persistent asthma, uncomplicated: Secondary | ICD-10-CM

## 2015-07-18 DIAGNOSIS — J45901 Unspecified asthma with (acute) exacerbation: Secondary | ICD-10-CM | POA: Insufficient documentation

## 2015-07-18 MED ORDER — PATADAY 0.2 % OP SOLN: OPHTHALMIC | Status: DC

## 2015-07-18 MED ORDER — MONTELUKAST SODIUM 10 MG PO TABS: ORAL_TABLET | ORAL | Status: DC

## 2015-07-18 MED ORDER — BUDESONIDE-FORMOTEROL FUMARATE 160-4.5 MCG/ACT IN AERO: INHALATION_SPRAY | RESPIRATORY_TRACT | Status: DC

## 2015-07-18 MED ORDER — ALBUTEROL SULFATE HFA 108 (90 BASE) MCG/ACT IN AERS: INHALATION_SPRAY | RESPIRATORY_TRACT | Status: DC

## 2015-07-18 MED ORDER — FLUTICASONE PROPIONATE 50 MCG/ACT NA SUSP: NASAL | Status: DC

## 2015-07-18 NOTE — Progress Notes (Signed)
History of present illness: HPI Comments: Faith Padilla is a 64 y.o. female with persistent asthma and allergic rhinitis presents today for sick visit.  She reports that her upper and lower respiratory symptoms had been well-controlled until a few days ago when she began to experience nasal congestion, thick postnasal drainage, and coughing.  She denies discolored mucus and does not believe that she has been febrile.  She denies dyspnea, chest tightness, or wheezing.   Assessment and plan: Acute sinusitis  Prednisone has been provided, 20 mg x 4 days, 10 mg x1 day, then stop.  Guaifenesin 1200 mg twice daily as needed with adequate hydration as discussed.   Nasal saline lavage as needed has been recommended along with instructions for proper administration.  The patient has been asked to contact me if her symptoms persist, progress, or if she becomes febrile. Otherwise, she may return for follow up in 4 months.  Moderate persistent asthma  Continue Symbicort 160/4.5, 2 inhalations via spacer device twice a day, montelukast 10 mg daily at bedtime, and albuterol HFA, 1-2 inhalations every 4-6 hours as needed.  Subjective and objective measures of pulmonary function will be followed and the treatment plan will be adjusted accordingly.    Medications ordered this encounter: Meds ordered this encounter  Medications  . albuterol (PROVENTIL HFA;VENTOLIN HFA) 108 (90 BASE) MCG/ACT inhaler    Sig: Use 2 puffs every 4 hours as needed to prevent cough or wheeze.  May use 2 puffs 10-20 minutes prior to exercise.  Use with spacer.    Dispense:  2 Inhaler    Refill:  1  . fluticasone (FLONASE) 50 MCG/ACT nasal spray    Sig: Use 1-2 sprays in each nostril 1-2 times daily as needed for stuffy nose or drainage.    Dispense:  17 g    Refill:  5  . montelukast (SINGULAIR) 10 MG tablet    Sig: Take one daily in the evening to prevent cough or wheeze.    Dispense:  30 tablet    Refill:  5  .  PATADAY 0.2 % SOLN    Sig: Apply one drop in each eye once daily as needed for itchy eyes.    Dispense:  1 Bottle    Refill:  5  . budesonide-formoterol (SYMBICORT) 160-4.5 MCG/ACT inhaler    Sig: Use 2 puffs twice daily to prevent cough or wheeze.  Rinse, gargle, and spit after use.    Dispense:  10.2 g    Refill:  3    Diagnositics: Spirometry:  Normal.  Please see scanned spirometry results for details.     Physical examination: Blood pressure 132/90, pulse 76, temperature 98.2 F (36.8 C), temperature source Oral, resp. rate 20.  General: Alert, interactive, in no acute distress. HEENT: TMs pearly gray, turbinates moderately edematous with clear discharge, post-pharynx erythematous. Neck: Supple without lymphadenopathy. Lungs: Clear to auscultation without wheezing, rhonchi or rales. CV: Normal S1, S2 without murmurs. Skin: Warm and dry, without lesions or rashes.  The following portions of the patient's history were reviewed and updated as appropriate: allergies, current medications, past family history, past medical history, past social history, past surgical history and problem list.  Outpatient medications:   Medication List       This list is accurate as of: 07/18/15 12:55 PM.  Always use your most recent med list.               albuterol 108 (90 BASE) MCG/ACT inhaler  Commonly known as:  PROVENTIL HFA;VENTOLIN HFA  Use 2 puffs every 4 hours as needed to prevent cough or wheeze.  May use 2 puffs 10-20 minutes prior to exercise.  Use with spacer.     atorvastatin 20 MG tablet  Commonly known as:  LIPITOR  Take 1 tablet (20 mg total) by mouth daily. bedtime     budesonide-formoterol 160-4.5 MCG/ACT inhaler  Commonly known as:  SYMBICORT  Use 2 puffs twice daily to prevent cough or wheeze.  Rinse, gargle, and spit after use.     cetirizine 10 MG tablet  Commonly known as:  ZYRTEC  TAKE 1 TABLET BY MOUTH AT BEDTIME     doxycycline 100 MG tablet  Commonly  known as:  VIBRA-TABS  Take 1 tablet (100 mg total) by mouth daily.     DULoxetine 30 MG capsule  Commonly known as:  CYMBALTA  Take 30 mg by mouth daily.     fluconazole 150 MG tablet  Commonly known as:  DIFLUCAN  Take 1 tablet (150 mg total) by mouth once. May repeat once 3 days later     fluticasone 50 MCG/ACT nasal spray  Commonly known as:  FLONASE  Use 1-2 sprays in each nostril 1-2 times daily as needed for stuffy nose or drainage.     hydrOXYzine 25 MG tablet  Commonly known as:  ATARAX/VISTARIL  Take 1 tablet (25 mg total) by mouth 3 (three) times daily as needed.     latanoprost 0.005 % ophthalmic solution  Commonly known as:  XALATAN  Apply 0.005 drops to eye at bedtime.     meloxicam 15 MG tablet  Commonly known as:  MOBIC  Take 1 tablet (15 mg total) by mouth daily.     montelukast 10 MG tablet  Commonly known as:  SINGULAIR  Take one daily in the evening to prevent cough or wheeze.     MULTIPLE VITAMIN PO  Take by mouth daily.     OMEGA-3 FATTY ACIDS PO  Take by mouth 2 (two) times daily.     omeprazole 20 MG capsule  Commonly known as:  PRILOSEC  Take 1 capsule (20 mg total) by mouth 2 (two) times daily before a meal.     PATADAY 0.2 % Soln  Generic drug:  Olopatadine HCl  Apply one drop in each eye once daily as needed for itchy eyes.     PREMARIN vaginal cream  Generic drug:  conjugated estrogens     solifenacin 5 MG tablet  Commonly known as:  VESICARE  Take 5 mg by mouth daily.     triamcinolone ointment 0.1 %  Commonly known as:  KENALOG  Apply 1 application topically daily.     Vitamin C Chew  Chew by mouth daily.     Vitamin D (Ergocalciferol) 50000 UNITS Caps capsule  Commonly known as:  DRISDOL  TK 1 C PO ONCE WEEKLY        Known medication allergies: Allergies  Allergen Reactions  . Morphine Hives    I appreciate the opportunity to take part in this Adalyna's care. Please do not hesitate to contact me with  questions.  Sincerely,   R. Edgar Frisk, MD

## 2015-07-18 NOTE — Assessment & Plan Note (Signed)
   Continue Symbicort 160/4.5, 2 inhalations via spacer device twice a day, montelukast 10 mg daily at bedtime, and albuterol HFA, 1-2 inhalations every 4-6 hours as needed.  Subjective and objective measures of pulmonary function will be followed and the treatment plan will be adjusted accordingly.

## 2015-07-18 NOTE — Assessment & Plan Note (Signed)
   Prednisone has been provided, 20 mg x 4 days, 10 mg x1 day, then stop.  Guaifenesin 1200 mg twice daily as needed with adequate hydration as discussed.   Nasal saline lavage as needed has been recommended along with instructions for proper administration.  The patient has been asked to contact me if her symptoms persist, progress, or if she becomes febrile. Otherwise, she may return for follow up in 4 months.

## 2015-07-18 NOTE — Patient Instructions (Signed)
Acute sinusitis  Prednisone has been provided, 20 mg x 4 days, 10 mg x1 day, then stop.  Guaifenesin 1200 mg twice daily as needed with adequate hydration as discussed.   Nasal saline lavage as needed has been recommended along with instructions for proper administration.  The patient has been asked to contact me if her symptoms persist, progress, or if she becomes febrile. Otherwise, she may return for follow up in 4 months.  Moderate persistent asthma  Continue Symbicort 160/4.5, 2 inhalations via spacer device twice a day, montelukast 10 mg daily at bedtime, and albuterol HFA, 1-2 inhalations every 4-6 hours as needed.  Subjective and objective measures of pulmonary function will be followed and the treatment plan will be adjusted accordingly.   Return in about 4 months (around 11/15/2015), or if symptoms worsen or fail to improve.

## 2015-08-08 ENCOUNTER — Other Ambulatory Visit: Payer: Self-pay | Admitting: Family Medicine

## 2015-08-10 ENCOUNTER — Telehealth: Payer: Self-pay | Admitting: Family Medicine

## 2015-08-10 DIAGNOSIS — M1711 Unilateral primary osteoarthritis, right knee: Secondary | ICD-10-CM

## 2015-08-10 MED ORDER — MELOXICAM 15 MG PO TABS: 15.0000 mg | ORAL_TABLET | Freq: Every day | ORAL | Status: DC

## 2015-08-10 MED ORDER — ATORVASTATIN CALCIUM 20 MG PO TABS
20.0000 mg | ORAL_TABLET | Freq: Every day | ORAL | Status: DC
Start: 2015-08-10 — End: 2015-08-14

## 2015-08-10 NOTE — Telephone Encounter (Signed)
Patient is scheduled for a medication refill appointment on 08/14/15 @ 4pm, however patient is out of the following medication and is needing a refill.  Atorvastatin 20 mg Meloxicam 15mg   Please call patient once this is done or if the request cannot be fulfilled.

## 2015-08-10 NOTE — Telephone Encounter (Signed)
Medication has been refilled and sent to [facility] [address] 

## 2015-08-14 ENCOUNTER — Encounter: Payer: Self-pay | Admitting: Family Medicine

## 2015-08-14 ENCOUNTER — Ambulatory Visit (INDEPENDENT_AMBULATORY_CARE_PROVIDER_SITE_OTHER): Payer: Medicare Other | Admitting: Family Medicine

## 2015-08-14 VITALS — BP 134/77 | HR 119 | Temp 98.3°F | Resp 19 | Ht 63.0 in | Wt 280.6 lb

## 2015-08-14 DIAGNOSIS — M1711 Unilateral primary osteoarthritis, right knee: Secondary | ICD-10-CM

## 2015-08-14 DIAGNOSIS — K219 Gastro-esophageal reflux disease without esophagitis: Secondary | ICD-10-CM | POA: Diagnosis not present

## 2015-08-14 DIAGNOSIS — Z23 Encounter for immunization: Secondary | ICD-10-CM | POA: Diagnosis not present

## 2015-08-14 DIAGNOSIS — E785 Hyperlipidemia, unspecified: Secondary | ICD-10-CM | POA: Insufficient documentation

## 2015-08-14 DIAGNOSIS — R7303 Prediabetes: Secondary | ICD-10-CM | POA: Insufficient documentation

## 2015-08-14 DIAGNOSIS — R739 Hyperglycemia, unspecified: Secondary | ICD-10-CM | POA: Diagnosis not present

## 2015-08-14 HISTORY — DX: Prediabetes: R73.03

## 2015-08-14 LAB — POCT GLYCOSYLATED HEMOGLOBIN (HGB A1C): HEMOGLOBIN A1C: 5.6

## 2015-08-14 MED ORDER — MELOXICAM 15 MG PO TABS: 15.0000 mg | ORAL_TABLET | Freq: Every day | ORAL | Status: DC

## 2015-08-14 MED ORDER — OMEPRAZOLE 20 MG PO CPDR: 20.0000 mg | DELAYED_RELEASE_CAPSULE | Freq: Two times a day (BID) | ORAL | Status: DC

## 2015-08-14 MED ORDER — ATORVASTATIN CALCIUM 20 MG PO TABS: 20.0000 mg | ORAL_TABLET | Freq: Every day | ORAL | Status: DC

## 2015-08-14 NOTE — Progress Notes (Signed)
Name: Faith Padilla   MRN: [id number]    DOB: 26-Mar-1951   Date:08/14/2015       Progress Note  Subjective  Chief Complaint  Chief Complaint  Patient presents with  . Medication Refill    atorvastatin 20 mg / meloxicam 15 mg / omeprazole 20 mg  . Asthma    Hyperlipidemia This is a chronic problem. The problem is uncontrolled. Recent lipid tests were reviewed and are high. Exacerbating diseases include obesity. Associated symptoms include myalgias. Pertinent negatives include no chest pain, focal weakness or shortness of breath. Current antihyperlipidemic treatment includes statins.  Arthritis Presents for follow-up visit. The disease course has been stable. She complains of pain. She reports no stiffness, joint swelling or joint warmth. Affected locations include the left knee and right knee. Pertinent negatives include no fever. Her past medical history is significant for osteoarthritis. Past treatments include NSAIDs. The treatment provided significant relief. Compliance with prior treatments has been good.  Gastroesophageal Reflux She complains of heartburn. She reports no abdominal pain, no chest pain, no coughing or no nausea. This is a chronic problem. The problem has been unchanged. Risk factors include NSAIDs and obesity. She has tried a PPI for the symptoms.     Past Medical History  Diagnosis Date  . Glaucoma   . HLD (hyperlipidemia)   . Diverticulitis   . Sleep apnea   . Vaginal yeast infection   . Osteoarthritis     lumbar spine  . Acid reflux   . Microscopic hematuria   . Mixed incontinence   . Morbid obesity [facility])     Past Surgical History  Procedure Laterality Date  . Abdominal hysterectomy  1998  . Back surgery  1990/2014  . Cataract extraction, bilateral  2014  . Lithotripsy  2012  . Deviated nose septum surgery  2000    Family History  Problem Relation Age of Onset  . Heart disease Mother   . Diabetes Mother   . Cancer Father     lung cancer  .  COPD Father   . Cancer Sister     Brain Cancer  . Kidney disease Mother   . Hematuria Daughter   . Liver cancer Father     Social History   Social History  . Marital Status: Single    Spouse Name: N/A  . Number of Children: N/A  . Years of Education: N/A   Occupational History  . Not on file.   Social History Main Topics  . Smoking status: Never Smoker   . Smokeless tobacco: Never Used  . Alcohol Use: No  . Drug Use: No  . Sexual Activity: Not on file   Other Topics Concern  . Not on file   Social History Narrative     Current outpatient prescriptions:  .  albuterol (PROVENTIL HFA;VENTOLIN HFA) 108 (90 BASE) MCG/ACT inhaler, Use 2 puffs every 4 hours as needed to prevent cough or wheeze.  May use 2 puffs 10-20 minutes prior to exercise.  Use with spacer., Disp: 2 Inhaler, Rfl: 1 .  atorvastatin (LIPITOR) 20 MG tablet, Take 1 tablet (20 mg total) by mouth daily. bedtime, Disp: 30 tablet, Rfl: 0 .  Bioflavonoid Products (VITAMIN C) CHEW, Chew by mouth daily., Disp: , Rfl:  .  budesonide-formoterol (SYMBICORT) 160-4.5 MCG/ACT inhaler, Use 2 puffs twice daily to prevent cough or wheeze.  Rinse, gargle, and spit after use., Disp: 10.2 g, Rfl: 3 .  cetirizine (ZYRTEC) 10 MG tablet, TAKE 1 TABLET BY  MOUTH AT BEDTIME, Disp: 30 tablet, Rfl: 3 .  doxycycline (VIBRA-TABS) 100 MG tablet, Take 1 tablet (100 mg total) by mouth daily., Disp: 7 tablet, Rfl: 0 .  DULoxetine (CYMBALTA) 30 MG capsule, Take 30 mg by mouth daily., Disp: , Rfl:  .  fluconazole (DIFLUCAN) 150 MG tablet, Take 1 tablet (150 mg total) by mouth once. May repeat once 3 days later, Disp: 2 tablet, Rfl: 0 .  fluticasone (FLONASE) 50 MCG/ACT nasal spray, Use 1-2 sprays in each nostril 1-2 times daily as needed for stuffy nose or drainage., Disp: 17 g, Rfl: 5 .  hydrOXYzine (ATARAX/VISTARIL) 25 MG tablet, Take 1 tablet (25 mg total) by mouth 3 (three) times daily as needed., Disp: 30 tablet, Rfl: 0 .  latanoprost  (XALATAN) 0.005 % ophthalmic solution, Apply 0.005 drops to eye at bedtime., Disp: , Rfl:  .  meloxicam (MOBIC) 15 MG tablet, Take 1 tablet (15 mg total) by mouth daily., Disp: 30 tablet, Rfl: 0 .  montelukast (SINGULAIR) 10 MG tablet, Take one daily in the evening to prevent cough or wheeze., Disp: 30 tablet, Rfl: 5 .  MULTIPLE VITAMIN PO, Take by mouth daily., Disp: , Rfl:  .  OMEGA-3 FATTY ACIDS PO, Take by mouth 2 (two) times daily., Disp: , Rfl:  .  omeprazole (PRILOSEC) 20 MG capsule, Take 1 capsule (20 mg total) by mouth 2 (two) times daily before a meal., Disp: 60 capsule, Rfl: 2 .  PATADAY 0.2 % SOLN, Apply one drop in each eye once daily as needed for itchy eyes., Disp: 1 Bottle, Rfl: 5 .  PREMARIN vaginal cream, , Disp: , Rfl: 3 .  triamcinolone ointment (KENALOG) 0.1 %, Apply 1 application topically daily., Disp: 30 g, Rfl: 0 .  Vitamin D, Ergocalciferol, (DRISDOL) 50000 UNITS CAPS capsule, TK 1 C PO ONCE WEEKLY, Disp: , Rfl: 4 .  solifenacin (VESICARE) 5 MG tablet, Take 5 mg by mouth daily., Disp: , Rfl:   Allergies  Allergen Reactions  . Morphine Hives     Review of Systems  Constitutional: Negative for fever and chills.  Eyes: Negative for blurred vision and double vision.  Respiratory: Negative for cough and shortness of breath.   Cardiovascular: Negative for chest pain and palpitations.  Gastrointestinal: Positive for heartburn. Negative for nausea, vomiting and abdominal pain.  Musculoskeletal: Positive for myalgias, joint pain and arthritis. Negative for joint swelling and stiffness.  Neurological: Negative for focal weakness and headaches.    Objective  Filed Vitals:   08/14/15 1553  BP: 134/77  Pulse: 119  Temp: 98.3 F (36.8 C)  TempSrc: Oral  Resp: 19  Height: 5\' 3"  (1.6 m)  Weight: 280 lb 9.6 oz (127.279 kg)  SpO2: 95%    Physical Exam  Constitutional: She is oriented to person, place, and time and well-developed, well-nourished, and in no distress.   HENT:  Head: Normocephalic and atraumatic.  Cardiovascular: Normal rate, regular rhythm and normal heart sounds.   No murmur heard. Pulmonary/Chest: Effort normal and breath sounds normal. She has no wheezes.  Abdominal: Soft. Bowel sounds are normal. There is no tenderness.  Musculoskeletal:       Right knee: She exhibits no swelling. Tenderness found.       Left knee: She exhibits no swelling. Tenderness found.       Right lower leg: She exhibits tenderness. She exhibits no swelling.       Left lower leg: She exhibits tenderness. She exhibits no swelling.  Neurological: She  is alert and oriented to person, place, and time.  Psychiatric: Mood, memory, affect and judgment normal.  Nursing note and vitals reviewed.  Assessment & Plan  1. Need for immunization against influenza  - Flu Vaccine QUAD 36+ mos PF IM (Fluarix & Fluzone Quad PF)  2. Gastroesophageal reflux disease, esophagitis presence not specified Stable on daily PPI therapy. - omeprazole (PRILOSEC) 20 MG capsule; Take 1 capsule (20 mg total) by mouth 2 (two) times daily before a meal.  Dispense: 180 capsule; Refill: 0  3. Primary osteoarthritis of right knee Stable on daily NSAID therapy. - meloxicam (MOBIC) 15 MG tablet; Take 1 tablet (15 mg total) by mouth daily.  Dispense: 90 tablet; Refill: 0  4. Hyperlipidemia  - atorvastatin (LIPITOR) 20 MG tablet; Take 1 tablet (20 mg total) by mouth daily. bedtime  Dispense: 90 tablet; Refill: 0 - Lipid Profile - Comprehensive Metabolic Panel (CMET)  5. Hyperglycemia  - POCT HgB A1C    Muscab Brenneman [facility] 08/14/2015 4:36 PM

## 2015-08-16 LAB — COMPREHENSIVE METABOLIC PANEL
A/G RATIO: 1.4 (ref 1.1–2.5)
ALBUMIN: 3.8 g/dL (ref 3.6–4.8)
ALT: 20 IU/L (ref 0–32)
AST: 16 IU/L (ref 0–40)
Alkaline Phosphatase: 113 IU/L (ref 39–117)
BUN/Creatinine Ratio: 16 (ref 11–26)
BUN: 16 mg/dL (ref 8–27)
Bilirubin Total: 0.3 mg/dL (ref 0.0–1.2)
CALCIUM: 9.2 mg/dL (ref 8.7–10.3)
CHLORIDE: 104 mmol/L (ref 97–106)
CO2: 24 mmol/L (ref 18–29)
CREATININE: 0.99 mg/dL (ref 0.57–1.00)
GFR calc Af Amer: 70 mL/min/{1.73_m2} (ref >59)
GFR calc non Af Amer: 60 mL/min/{1.73_m2} (ref >59)
GLOBULIN, TOTAL: 2.8 g/dL (ref 1.5–4.5)
Glucose: 102 mg/dL — ABNORMAL HIGH (ref 65–99)
Potassium: 4.8 mmol/L (ref 3.5–5.2)
Sodium: 142 mmol/L (ref 136–144)
TOTAL PROTEIN: 6.6 g/dL (ref 6.0–8.5)

## 2015-08-16 LAB — LIPID PANEL
CHOL/HDL RATIO: 4.4 ratio (ref 0.0–4.4)
Cholesterol, Total: 142 mg/dL (ref 100–199)
HDL: 32 mg/dL — AB (ref >39)
LDL CALC: 80 mg/dL (ref 0–99)
Triglycerides: 152 mg/dL — ABNORMAL HIGH (ref 0–149)
VLDL CHOLESTEROL CAL: 30 mg/dL (ref 5–40)

## 2015-08-21 DIAGNOSIS — F3341 Major depressive disorder, recurrent, in partial remission: Secondary | ICD-10-CM | POA: Diagnosis not present

## 2015-08-30 ENCOUNTER — Ambulatory Visit: Payer: Medicaid Other | Admitting: Family Medicine

## 2015-09-11 DIAGNOSIS — F3341 Major depressive disorder, recurrent, in partial remission: Secondary | ICD-10-CM | POA: Diagnosis not present

## 2015-09-13 DIAGNOSIS — F411 Generalized anxiety disorder: Secondary | ICD-10-CM | POA: Diagnosis not present

## 2015-10-01 ENCOUNTER — Ambulatory Visit (INDEPENDENT_AMBULATORY_CARE_PROVIDER_SITE_OTHER): Payer: Medicare Other | Admitting: Family Medicine

## 2015-10-01 ENCOUNTER — Encounter: Payer: Self-pay | Admitting: Family Medicine

## 2015-10-01 VITALS — BP 130/70 | HR 110 | Temp 98.7°F | Resp 16 | Ht 63.0 in | Wt 278.9 lb

## 2015-10-01 DIAGNOSIS — R42 Dizziness and giddiness: Secondary | ICD-10-CM

## 2015-10-01 MED ORDER — MUPIROCIN 2 % EX OINT: 1.0000 "application " | TOPICAL_OINTMENT | Freq: Two times a day (BID) | CUTANEOUS | Status: DC

## 2015-10-01 MED ORDER — AMOXICILLIN-POT CLAVULANATE 875-125 MG PO TABS: 1.0000 | ORAL_TABLET | Freq: Two times a day (BID) | ORAL | Status: DC

## 2015-10-01 MED ORDER — PREDNISONE 20 MG PO TABS: 20.0000 mg | ORAL_TABLET | Freq: Two times a day (BID) | ORAL | Status: DC

## 2015-10-01 NOTE — Progress Notes (Signed)
Name: Faith Padilla   MRN: [id number]    DOB: [birth year]   Date:10/01/2015       Progress Note  Subjective  Chief Complaint  Chief Complaint  Patient presents with  . Dizziness    dizziness, ringing in ears for 5 days    HPI  Dizziness and tinnitus  Patient presents with a five-day history of ringing in the ears and dizziness.  Past Medical History  Diagnosis Date  . Glaucoma   . HLD (hyperlipidemia)   . Diverticulitis   . Sleep apnea   . Vaginal yeast infection   . Osteoarthritis     lumbar spine  . Acid reflux   . Microscopic hematuria   . Mixed incontinence   . Morbid obesity ([place])     Social History  Substance Use Topics  . Smoking status: Never Smoker   . Smokeless tobacco: Never Used  . Alcohol Use: No     Current outpatient prescriptions:  .  albuterol (PROVENTIL HFA;VENTOLIN HFA) 108 (90 BASE) MCG/ACT inhaler, Use 2 puffs every 4 hours as needed to prevent cough or wheeze.  May use 2 puffs 10-20 minutes prior to exercise.  Use with spacer., Disp: 2 Inhaler, Rfl: 1 .  atorvastatin (LIPITOR) 20 MG tablet, Take 1 tablet (20 mg total) by mouth daily. bedtime, Disp: 90 tablet, Rfl: 0 .  Bioflavonoid Products (VITAMIN C) CHEW, Chew by mouth daily., Disp: , Rfl:  .  budesonide-formoterol (SYMBICORT) 160-4.5 MCG/ACT inhaler, Use 2 puffs twice daily to prevent cough or wheeze.  Rinse, gargle, and spit after use., Disp: 10.2 g, Rfl: 3 .  cetirizine (ZYRTEC) 10 MG tablet, TAKE 1 TABLET BY MOUTH AT BEDTIME, Disp: 30 tablet, Rfl: 3 .  doxycycline (VIBRA-TABS) 100 MG tablet, Take 1 tablet (100 mg total) by mouth daily., Disp: 7 tablet, Rfl: 0 .  DULoxetine (CYMBALTA) 30 MG capsule, Take 30 mg by mouth daily., Disp: , Rfl:  .  fluconazole (DIFLUCAN) 150 MG tablet, Take 1 tablet (150 mg total) by mouth once. May repeat once 3 days later, Disp: 2 tablet, Rfl: 0 .  fluticasone (FLONASE) 50 MCG/ACT nasal spray, Use 1-2 sprays in each nostril 1-2 times daily as needed for stuffy  nose or drainage., Disp: 17 g, Rfl: 5 .  hydrOXYzine (ATARAX/VISTARIL) 25 MG tablet, Take 1 tablet (25 mg total) by mouth 3 (three) times daily as needed., Disp: 30 tablet, Rfl: 0 .  latanoprost (XALATAN) 0.005 % ophthalmic solution, Apply 0.005 drops to eye at bedtime., Disp: , Rfl:  .  meloxicam (MOBIC) 15 MG tablet, Take 1 tablet (15 mg total) by mouth daily., Disp: 90 tablet, Rfl: 0 .  montelukast (SINGULAIR) 10 MG tablet, Take one daily in the evening to prevent cough or wheeze., Disp: 30 tablet, Rfl: 5 .  MULTIPLE VITAMIN PO, Take by mouth daily., Disp: , Rfl:  .  OMEGA-3 FATTY ACIDS PO, Take by mouth 2 (two) times daily., Disp: , Rfl:  .  omeprazole (PRILOSEC) 20 MG capsule, Take 1 capsule (20 mg total) by mouth 2 (two) times daily before a meal., Disp: 180 capsule, Rfl: 0 .  PATADAY 0.2 % SOLN, Apply one drop in each eye once daily as needed for itchy eyes., Disp: 1 Bottle, Rfl: 5 .  PREMARIN vaginal cream, , Disp: , Rfl: 3 .  solifenacin (VESICARE) 5 MG tablet, Take 5 mg by mouth daily., Disp: , Rfl:  .  triamcinolone ointment (KENALOG) 0.1 %, Apply 1 application topically daily., Disp: 30  g, Rfl: 0 .  Vitamin D, Ergocalciferol, (DRISDOL) 50000 UNITS CAPS capsule, TK 1 C PO ONCE WEEKLY, Disp: , Rfl: 4  Allergies  Allergen Reactions  . Morphine Hives    Review of Systems  Constitutional: Negative for fever, chills and weight loss.  HENT: Positive for tinnitus. Negative for congestion, hearing loss and sore throat.   Eyes: Negative for blurred vision, double vision and redness.  Respiratory: Negative for cough, hemoptysis and shortness of breath.   Cardiovascular: Negative for chest pain, palpitations, orthopnea, claudication and leg swelling.  Gastrointestinal: Negative for heartburn, nausea, vomiting, diarrhea, constipation and blood in stool.  Genitourinary: Negative for dysuria, urgency, frequency and hematuria.  Musculoskeletal: Negative for myalgias, back pain, joint pain,  falls and neck pain.  Skin: Negative for itching.  Neurological: Positive for dizziness. Negative for tingling, tremors, focal weakness, seizures, loss of consciousness, weakness and headaches.  Endo/Heme/Allergies: Does not bruise/bleed easily.  Psychiatric/Behavioral: Negative for depression and substance abuse. The patient is not nervous/anxious and does not have insomnia.      Objective  Filed Vitals:   10/01/15 1047  BP: 130/70  Pulse: 110  Temp: 98.7 F (37.1 C)  TempSrc: Oral  Resp: 16  Height: 5\' 3"  (1.6 m)  Weight: 278 lb 14.4 oz (126.508 kg)  SpO2: 96%     Physical Exam  Constitutional: She is oriented to person, place, and time and well-developed, well-nourished, and in no distress.  HENT:  Head: Normocephalic.  Eyes: EOM are normal. Pupils are equal, round, and reactive to light.  Neck: Normal range of motion. No thyromegaly present.  Cardiovascular: Normal rate, regular rhythm and normal heart sounds.   No murmur heard. Pulmonary/Chest: Effort normal and breath sounds normal.  Abdominal: Soft. Bowel sounds are normal.  Musculoskeletal: Normal range of motion. She exhibits no edema.  Neurological: She is alert and oriented to person, place, and time. No cranial nerve deficit. Gait normal.  Skin: Skin is warm and dry. No rash noted.  Psychiatric: Memory and affect normal.      Assessment & Plan  1. Dizziness With accompanying tinnitus  All insufflation intermittently. - predniSONE (DELTASONE) 20 MG tablet; Take 1 tablet (20 mg total) by mouth 2 (two) times daily with a meal. (Patient not taking: Reported on 10/15/2015)  Dispense: 10 tablet; Refill: 0

## 2015-10-09 DIAGNOSIS — F3341 Major depressive disorder, recurrent, in partial remission: Secondary | ICD-10-CM | POA: Diagnosis not present

## 2015-10-10 ENCOUNTER — Ambulatory Visit (INDEPENDENT_AMBULATORY_CARE_PROVIDER_SITE_OTHER): Payer: Medicare Other | Admitting: Family Medicine

## 2015-10-10 ENCOUNTER — Encounter: Payer: Self-pay | Admitting: Family Medicine

## 2015-10-10 ENCOUNTER — Ambulatory Visit
Admission: RE | Admit: 2015-10-10 | Discharge: 2015-10-10 | Disposition: A | Discharge status: Home / Self Care | Payer: Medicare Other | Source: Ambulatory Visit | Attending: Family Medicine | Admitting: Family Medicine

## 2015-10-10 VITALS — BP 110/68 | HR 118 | Temp 97.9°F | Resp 18 | Ht 63.0 in | Wt 282.0 lb

## 2015-10-10 DIAGNOSIS — J0100 Acute maxillary sinusitis, unspecified: Secondary | ICD-10-CM

## 2015-10-10 DIAGNOSIS — J45909 Unspecified asthma, uncomplicated: Secondary | ICD-10-CM | POA: Insufficient documentation

## 2015-10-10 DIAGNOSIS — G4730 Sleep apnea, unspecified: Secondary | ICD-10-CM

## 2015-10-10 DIAGNOSIS — R079 Chest pain, unspecified: Secondary | ICD-10-CM | POA: Insufficient documentation

## 2015-10-10 DIAGNOSIS — K449 Diaphragmatic hernia without obstruction or gangrene: Secondary | ICD-10-CM | POA: Diagnosis not present

## 2015-10-10 DIAGNOSIS — J0110 Acute frontal sinusitis, unspecified: Secondary | ICD-10-CM | POA: Insufficient documentation

## 2015-10-10 MED ORDER — MOXIFLOXACIN HCL 400 MG PO TABS: 400.0000 mg | ORAL_TABLET | Freq: Every day | ORAL | Status: DC

## 2015-10-10 NOTE — Progress Notes (Signed)
Name: Faith Padilla   MRN: [id number]    DOB: 11/07/1950   Date:10/10/2015       Progress Note  Subjective  Chief Complaint  Chief Complaint  Patient presents with  . Sleep Apnea  . issues with her balance/ recheck ears    HPI  Pt. Is here for replacing her sleep apnea machine. Pt. Reportedly has history of sleep apnea and received a CPAP machine in [place], [place] in 2013. She has noticed that the face mask does not fit her properly and that the machine needs more water to run that it used to. Wishes to have the machine replaced with a new one. She does not remember the name of the company that provided her with the machine in 2013.  Past Medical History  Diagnosis Date  . Glaucoma   . HLD (hyperlipidemia)   . Diverticulitis   . Sleep apnea   . Vaginal yeast infection   . Osteoarthritis     lumbar spine  . Acid reflux   . Microscopic hematuria   . Mixed incontinence   . Morbid obesity [facility])     Past Surgical History  Procedure Laterality Date  . Abdominal hysterectomy  1998  . Back surgery  1990/2014  . Cataract extraction, bilateral  2014  . Lithotripsy  2012  . Deviated nose septum surgery  2000    Family History  Problem Relation Age of Onset  . Heart disease Mother   . Diabetes Mother   . Cancer Father     lung cancer  . COPD Father   . Cancer Sister     Brain Cancer  . Kidney disease Mother   . Hematuria Daughter   . Liver cancer Father     Social History   Social History  . Marital Status: Single    Spouse Name: N/A  . Number of Children: N/A  . Years of Education: N/A   Occupational History  . Not on file.   Social History Main Topics  . Smoking status: Never Smoker   . Smokeless tobacco: Never Used  . Alcohol Use: No  . Drug Use: No  . Sexual Activity: Not on file   Other Topics Concern  . Not on file   Social History Narrative     Current outpatient prescriptions:  .  albuterol (PROVENTIL HFA;VENTOLIN HFA) 108 (90 BASE) MCG/ACT  inhaler, Use 2 puffs every 4 hours as needed to prevent cough or wheeze.  May use 2 puffs 10-20 minutes prior to exercise.  Use with spacer., Disp: 2 Inhaler, Rfl: 1 .  amoxicillin-clavulanate (AUGMENTIN) 875-125 MG tablet, Take 1 tablet by mouth 2 (two) times daily., Disp: 20 tablet, Rfl: 0 .  atorvastatin (LIPITOR) 20 MG tablet, Take 1 tablet (20 mg total) by mouth daily. bedtime, Disp: 90 tablet, Rfl: 0 .  Bioflavonoid Products (VITAMIN C) CHEW, Chew by mouth daily., Disp: , Rfl:  .  budesonide-formoterol (SYMBICORT) 160-4.5 MCG/ACT inhaler, Use 2 puffs twice daily to prevent cough or wheeze.  Rinse, gargle, and spit after use., Disp: 10.2 g, Rfl: 3 .  cetirizine (ZYRTEC) 10 MG tablet, TAKE 1 TABLET BY MOUTH AT BEDTIME, Disp: 30 tablet, Rfl: 3 .  doxycycline (VIBRA-TABS) 100 MG tablet, Take 1 tablet (100 mg total) by mouth daily., Disp: 7 tablet, Rfl: 0 .  DULoxetine (CYMBALTA) 30 MG capsule, Take 30 mg by mouth daily., Disp: , Rfl:  .  fluconazole (DIFLUCAN) 150 MG tablet, Take 1 tablet (150 mg total) by mouth  once. May repeat once 3 days later, Disp: 2 tablet, Rfl: 0 .  fluticasone (FLONASE) 50 MCG/ACT nasal spray, Use 1-2 sprays in each nostril 1-2 times daily as needed for stuffy nose or drainage., Disp: 17 g, Rfl: 5 .  hydrOXYzine (ATARAX/VISTARIL) 25 MG tablet, Take 1 tablet (25 mg total) by mouth 3 (three) times daily as needed., Disp: 30 tablet, Rfl: 0 .  latanoprost (XALATAN) 0.005 % ophthalmic solution, Apply 0.005 drops to eye at bedtime., Disp: , Rfl:  .  meloxicam (MOBIC) 15 MG tablet, Take 1 tablet (15 mg total) by mouth daily., Disp: 90 tablet, Rfl: 0 .  montelukast (SINGULAIR) 10 MG tablet, Take one daily in the evening to prevent cough or wheeze., Disp: 30 tablet, Rfl: 5 .  MULTIPLE VITAMIN PO, Take by mouth daily., Disp: , Rfl:  .  mupirocin ointment (BACTROBAN) 2 %, Apply 1 application topically 2 (two) times daily., Disp: 22 g, Rfl: 2 .  OMEGA-3 FATTY ACIDS PO, Take by mouth 2  (two) times daily., Disp: , Rfl:  .  omeprazole (PRILOSEC) 20 MG capsule, Take 1 capsule (20 mg total) by mouth 2 (two) times daily before a meal., Disp: 180 capsule, Rfl: 0 .  PATADAY 0.2 % SOLN, Apply one drop in each eye once daily as needed for itchy eyes., Disp: 1 Bottle, Rfl: 5 .  predniSONE (DELTASONE) 20 MG tablet, Take 1 tablet (20 mg total) by mouth 2 (two) times daily with a meal., Disp: 10 tablet, Rfl: 0 .  PREMARIN vaginal cream, , Disp: , Rfl: 3 .  solifenacin (VESICARE) 5 MG tablet, Take 5 mg by mouth daily., Disp: , Rfl:  .  triamcinolone ointment (KENALOG) 0.1 %, Apply 1 application topically daily., Disp: 30 g, Rfl: 0 .  Vitamin D, Ergocalciferol, (DRISDOL) 50000 UNITS CAPS capsule, TK 1 C PO ONCE WEEKLY, Disp: , Rfl: 4  Allergies  Allergen Reactions  . Morphine Hives     Review of Systems  Constitutional: Negative for fever, chills and weight loss.  HENT: Negative for congestion, ear discharge and ear pain.   Cardiovascular: Positive for chest pain (recurrent episodes of chest pain over the last 2 weeks, onset abrupt, come on at rest, resolve spontaneously).  Neurological: Positive for dizziness and headaches.    Objective  Filed Vitals:   10/10/15 1101  BP: 110/68  Pulse: 118  Temp: 97.9 F (36.6 C)  Resp: 18  Height: 5\' 3"  (1.6 m)  Weight: 282 lb (127.914 kg)  SpO2: 91%    Physical Exam  Constitutional: She is well-developed, well-nourished, and in no distress.  HENT:  Right Ear: Tympanic membrane and ear canal normal.  Nose: Right sinus exhibits maxillary sinus tenderness and frontal sinus tenderness. Left sinus exhibits maxillary sinus tenderness and frontal sinus tenderness.  Mouth/Throat: No posterior oropharyngeal erythema.  R. Ear canal has whitish debris, TM gray, normal.  Cardiovascular: Normal rate, regular rhythm and normal heart sounds.   Pulmonary/Chest: Effort normal and breath sounds normal.  Nursing note and vitals  reviewed.    Assessment & Plan  1. Subacute maxillary sinusitis Records reviewed from Dr. Caprice Kluver office visit. She reports amoxicillin has not helped relieve her symptoms. We will DC amoxicillin and start on moxifloxacin. - moxifloxacin (AVELOX) 400 MG tablet; Take 1 tablet (400 mg total) by mouth daily.  Dispense: 7 tablet; Refill: 0  2. Chest pain at rest EKG is normal sinus rhythm. Referral to cardiology for chest pains. - EKG 12-Lead - DG Chest  2 View; Future - Ambulatory referral to Cardiology  3. Sleep apnea  - Ambulatory referral to Sleep Studies    Shawana Knoch [facility] 10/10/2015 11:47 AM

## 2015-10-11 DIAGNOSIS — G4730 Sleep apnea, unspecified: Secondary | ICD-10-CM | POA: Insufficient documentation

## 2015-10-11 DIAGNOSIS — F411 Generalized anxiety disorder: Secondary | ICD-10-CM | POA: Diagnosis not present

## 2015-10-14 ENCOUNTER — Ambulatory Visit: Payer: Medicare Other | Admitting: Family Medicine

## 2015-10-15 ENCOUNTER — Encounter: Payer: Self-pay | Admitting: Allergy and Immunology

## 2015-10-15 ENCOUNTER — Ambulatory Visit (INDEPENDENT_AMBULATORY_CARE_PROVIDER_SITE_OTHER): Payer: Medicare Other | Admitting: Allergy and Immunology

## 2015-10-15 VITALS — BP 122/70 | HR 74 | Temp 98.0°F | Resp 16

## 2015-10-15 DIAGNOSIS — J019 Acute sinusitis, unspecified: Secondary | ICD-10-CM

## 2015-10-15 DIAGNOSIS — J45901 Unspecified asthma with (acute) exacerbation: Secondary | ICD-10-CM | POA: Diagnosis not present

## 2015-10-15 DIAGNOSIS — B370 Candidal stomatitis: Secondary | ICD-10-CM | POA: Diagnosis not present

## 2015-10-15 DIAGNOSIS — R42 Dizziness and giddiness: Secondary | ICD-10-CM

## 2015-10-15 MED ORDER — CLOTRIMAZOLE 10 MG MT TROC: 10.0000 mg | Freq: Every day | OROMUCOSAL | Status: DC

## 2015-10-15 MED ORDER — IPRATROPIUM BROMIDE 0.02 % IN SOLN
0.5000 mg | Freq: Once | RESPIRATORY_TRACT | Status: AC
  Administered 2015-10-15: 0.5 mg via RESPIRATORY_TRACT

## 2015-10-15 MED ORDER — METHYLPREDNISOLONE ACETATE 80 MG/ML IJ SUSP
80.0000 mg | Freq: Once | INTRAMUSCULAR | Status: AC
  Administered 2015-10-15: 80 mg via INTRAMUSCULAR

## 2015-10-15 MED ORDER — PREDNISONE 1 MG PO TABS: 10.0000 mg | ORAL_TABLET | ORAL | Status: DC

## 2015-10-15 MED ORDER — LEVALBUTEROL HCL 1.25 MG/3ML IN NEBU
1.2500 mg | INHALATION_SOLUTION | Freq: Once | RESPIRATORY_TRACT | Status: AC
  Administered 2015-10-15: 1.25 mg via RESPIRATORY_TRACT

## 2015-10-15 NOTE — Assessment & Plan Note (Signed)
   Systemic steroids have been provided (as above).  Continue Symbicort 160/4.5 g, 2 inhalations via spacer device twice a day, montelukast 10 mg daily at bedtime, and albuterol HFA every 4-6 hours as needed.  The patient has been asked to contact me if her symptoms persist or progress. Otherwise, she may return for follow up in 4 months.

## 2015-10-15 NOTE — Progress Notes (Signed)
Follow-up Note  RE: Faith Padilla MRN: [id number] DOB: 11-18-1950 Date of Office Visit: 10/15/2015  Primary care provider: Keith Rake, MD Referring provider: Roselee Nova, MD  History of present illness: HPI Comments: Faith Padilla is a 65 y.o. female with persistent asthma, allergic rhinitis, and gastroesophageal reflux disease who presents today for sick visit.  She reports that over the past 3 weeks she has experienced coughing, chest tightness, and possible wheezing.  She has experienced thick postnasal drainage, throat clearing, a throbbing headache at the crown of her head, ear pressure, and recurrent vertigo.  She reports the vertigo is worse when changing positions or turning her head and calms down when she sits still for a period of time.  She reports that she has been to her primary care physician's office twice over the past 3 weeks and was prescribed amoxicillin initially and is currently taking moxifloxacin without perceived symptom reduction.  She complains of tongue irritation and white patches on her tongue which she cannot remove with a toothbrush.  She uses a spacer device with her inhaled corticosteroid but admits that she does not rinse her mouth after using this medication.   Assessment and plan: Acute sinusitis  Depo-Medrol 80 mg was administered in the office.  Prednisone has been provided and is to be started tomorrow as follows: 20 mg daily x 4 days, 10 mg x1 day, then stop.  Faith Padilla has been instructed to continue/complete the course of moxifloxacin as previously prescribed.  Nasal saline lavage (NeilMed) as needed has been recommended along with instructions for proper administration.  Guaifenesin 1200 mg twice daily as needed with adequate hydration as discussed.   Asthma with acute exacerbation  Systemic steroids have been provided (as above).  Continue Symbicort 160/4.5 g, 2 inhalations via spacer device twice a day, montelukast 10 mg daily at  bedtime, and albuterol HFA every 4-6 hours as needed.  The patient has been asked to contact me if her symptoms persist or progress. Otherwise, she may return for follow up in 4 months.  Vertigo Most likely secondary to acute sinusitis, though her history suggests the possibility of benign paroxysmal positional vertigo.  Treatment plan as outlined above.  If the vertigo persists or progresses, we will refer to otolaryngology for further evaluation and treatment.  Oral candidiasis  I have discussed appropriate oral hygiene after inhaled corticosteroid use.  Patient has verbalized understanding.  A prescription has been provided for clotrimazole 10 mg troches: Slowly dissolve in the mouth 5 times per day for 10 days, not to be chewed or swallowed whole.    Meds ordered this encounter  Medications  . levalbuterol (XOPENEX) nebulizer solution 1.25 mg    Sig:   . ipratropium (ATROVENT) nebulizer solution 0.5 mg    Sig:   . methylPREDNISolone acetate (DEPO-MEDROL) injection 80 mg    Sig:   . predniSONE (DELTASONE) tablet 10 mg    Sig:   . clotrimazole (MYCELEX) 10 MG troche    Sig: Take 1 tablet (10 mg total) by mouth 5 (five) times daily. Slowly dissolve in the mouth.  Do not chew or swallow whole.    Dispense:  50 tablet    Refill:  0    Diagnositics: Spirometry:  FVC of 2.41 L and FEV1 of 2.04 L without significant postbronchodilator improvement.    Physical examination: Blood pressure 122/70, pulse 74, temperature 98 F (36.7 C), resp. rate 16, SpO2 97 %.  General: Alert, interactive, in no acute distress.  HEENT: TMs pearly gray, turbinates edematous with thick discharge, post-pharynx erythematous. Whitish plaques on the tongue. Neck: Supple without lymphadenopathy. Lungs: Bilateral expiratory wheezes auscultated.. CV: Normal S1, S2 without murmurs. Skin: Warm and dry, without lesions or rashes.  The following portions of the patient's history were reviewed and updated  as appropriate: allergies, current medications, past family history, past medical history, past social history, past surgical history and problem list.    Medication List       This list is accurate as of: 10/15/15  2:10 PM.  Always use your most recent med list.               albuterol 108 (90 Base) MCG/ACT inhaler  Commonly known as:  PROVENTIL HFA;VENTOLIN HFA  Use 2 puffs every 4 hours as needed to prevent cough or wheeze.  May use 2 puffs 10-20 minutes prior to exercise.  Use with spacer.     atorvastatin 20 MG tablet  Commonly known as:  LIPITOR  Take 1 tablet (20 mg total) by mouth daily. bedtime     budesonide-formoterol 160-4.5 MCG/ACT inhaler  Commonly known as:  SYMBICORT  Use 2 puffs twice daily to prevent cough or wheeze.  Rinse, gargle, and spit after use.     cetirizine 10 MG tablet  Commonly known as:  ZYRTEC  TAKE 1 TABLET BY MOUTH AT BEDTIME     clotrimazole 10 MG troche  Commonly known as:  MYCELEX  Take 1 tablet (10 mg total) by mouth 5 (five) times daily. Slowly dissolve in the mouth.  Do not chew or swallow whole.     doxycycline 100 MG tablet  Commonly known as:  VIBRA-TABS  Take 1 tablet (100 mg total) by mouth daily.     DULoxetine 30 MG capsule  Commonly known as:  CYMBALTA  Take 30 mg by mouth daily.     fluconazole 150 MG tablet  Commonly known as:  DIFLUCAN  Take 1 tablet (150 mg total) by mouth once. May repeat once 3 days later     fluticasone 50 MCG/ACT nasal spray  Commonly known as:  FLONASE  Use 1-2 sprays in each nostril 1-2 times daily as needed for stuffy nose or drainage.     hydrOXYzine 25 MG tablet  Commonly known as:  ATARAX/VISTARIL  Take 1 tablet (25 mg total) by mouth 3 (three) times daily as needed.     latanoprost 0.005 % ophthalmic solution  Commonly known as:  XALATAN  Apply 0.005 drops to eye at bedtime.     meloxicam 15 MG tablet  Commonly known as:  MOBIC  Take 1 tablet (15 mg total) by mouth daily.      montelukast 10 MG tablet  Commonly known as:  SINGULAIR  Take one daily in the evening to prevent cough or wheeze.     moxifloxacin 400 MG tablet  Commonly known as:  AVELOX  Take 1 tablet (400 mg total) by mouth daily.     MULTIPLE VITAMIN PO  Take by mouth daily.     mupirocin ointment 2 %  Commonly known as:  BACTROBAN  Apply 1 application topically 2 (two) times daily.     OMEGA-3 FATTY ACIDS PO  Take by mouth 2 (two) times daily. Reported on 10/15/2015     omeprazole 20 MG capsule  Commonly known as:  PRILOSEC  Take 1 capsule (20 mg total) by mouth 2 (two) times daily before a meal.     PATADAY 0.2 %  Soln  Generic drug:  Olopatadine HCl  Apply one drop in each eye once daily as needed for itchy eyes.     predniSONE 20 MG tablet  Commonly known as:  DELTASONE  Take 1 tablet (20 mg total) by mouth 2 (two) times daily with a meal.     PREMARIN vaginal cream  Generic drug:  conjugated estrogens     solifenacin 5 MG tablet  Commonly known as:  VESICARE  Take 5 mg by mouth daily. Reported on 10/15/2015     triamcinolone ointment 0.1 %  Commonly known as:  KENALOG  Apply 1 application topically daily.     Vitamin C Chew  Chew by mouth daily.     Vitamin D (Ergocalciferol) 50000 units Caps capsule  Commonly known as:  DRISDOL  TK 1 C PO ONCE WEEKLY        Allergies  Allergen Reactions  . Morphine Hives   Review of systems: Constitutional: Negative for fever, chills and weight loss.  HENT: Negative for nosebleeds.   Positive for vertigo, postnasal drainage, sinus pressure. Positive for thrush. Eyes: Negative for blurred vision.  Respiratory: Negative for hemoptysis.   Positive for coughing, chest tightness, wheezing. Cardiovascular: Negative for chest pain.  Gastrointestinal: Negative for diarrhea and constipation.  Genitourinary: Negative for dysuria.  Musculoskeletal: Negative for myalgias and joint pain.  Neurological: Negative for dizziness.    Endo/Heme/Allergies: Does not bruise/bleed easily.   Past Medical History  Diagnosis Date  . Glaucoma   . HLD (hyperlipidemia)   . Diverticulitis   . Sleep apnea   . Vaginal yeast infection   . Osteoarthritis     lumbar spine  . Acid reflux   . Microscopic hematuria   . Mixed incontinence   . Morbid obesity ([place])     Family History  Problem Relation Age of Onset  . Heart disease Mother   . Diabetes Mother   . Cancer Father     lung cancer  . COPD Father   . Cancer Sister     Brain Cancer  . Kidney disease Mother   . Hematuria Daughter   . Liver cancer Father     Social History   Social History  . Marital Status: Single    Spouse Name: N/A  . Number of Children: N/A  . Years of Education: N/A   Occupational History  . Not on file.   Social History Main Topics  . Smoking status: Never Smoker   . Smokeless tobacco: Never Used  . Alcohol Use: No  . Drug Use: No  . Sexual Activity: Not on file   Other Topics Concern  . Not on file   Social History Narrative    I appreciate the opportunity to take part in this Ilo's care. Please do not hesitate to contact me with questions.  Sincerely,   R. Edgar Frisk, MD

## 2015-10-15 NOTE — Assessment & Plan Note (Signed)
   Depo-Medrol 80 mg was administered in the office.  Prednisone has been provided and is to be started tomorrow as follows: 20 mg daily x 4 days, 10 mg x1 day, then stop.  Faith Padilla has been instructed to continue/complete the course of moxifloxacin as previously prescribed.  Nasal saline lavage (NeilMed) as needed has been recommended along with instructions for proper administration.  Guaifenesin 1200 mg twice daily as needed with adequate hydration as discussed.

## 2015-10-15 NOTE — Assessment & Plan Note (Signed)
   I have discussed appropriate oral hygiene after inhaled corticosteroid use.  Patient has verbalized understanding.  A prescription has been provided for clotrimazole 10 mg troches: Slowly dissolve in the mouth 5 times per day for 10 days, not to be chewed or swallowed whole.

## 2015-10-15 NOTE — Patient Instructions (Addendum)
Acute sinusitis  Depo-Medrol 80 mg was administered in the office.  Prednisone has been provided and is to be started tomorrow as follows: 20 mg daily x 4 days, 10 mg x1 day, then stop.  Faith Padilla has been instructed to continue/complete the course of moxifloxacin as previously prescribed.  Nasal saline lavage (NeilMed) as needed has been recommended along with instructions for proper administration.  Guaifenesin 1200 mg twice daily as needed with adequate hydration as discussed.   Asthma with acute exacerbation  Systemic steroids have been provided (as above).  Continue Symbicort 160/4.5 g, 2 inhalations via spacer device twice a day, montelukast 10 mg daily at bedtime, and albuterol HFA every 4-6 hours as needed.  The patient has been asked to contact me if her symptoms persist or progress. Otherwise, she may return for follow up in 4 months.  Vertigo Most likely secondary to acute sinusitis, though her history suggests the possibility of benign paroxysmal positional vertigo.  Treatment plan as outlined above.  If the vertigo persists or progresses, we will refer to otolaryngology for further evaluation and treatment.  Oral candidiasis  I have discussed appropriate oral hygiene after inhaled corticosteroid use.  Patient has verbalized understanding.  A prescription has been provided for clotrimazole 10 mg troches: Slowly dissolve in the mouth 5 times per day for 10 days, not to be chewed or swallowed whole.    Return in about 4 months (around 02/12/2016), or if symptoms worsen or fail to improve.

## 2015-10-15 NOTE — Assessment & Plan Note (Signed)
Most likely secondary to acute sinusitis, though her history suggests the possibility of benign paroxysmal positional vertigo.  Treatment plan as outlined above.  If the vertigo persists or progresses, we will refer to otolaryngology for further evaluation and treatment.

## 2015-10-23 DIAGNOSIS — F3341 Major depressive disorder, recurrent, in partial remission: Secondary | ICD-10-CM | POA: Diagnosis not present

## 2015-10-23 DIAGNOSIS — F411 Generalized anxiety disorder: Secondary | ICD-10-CM | POA: Diagnosis not present

## 2015-11-06 DIAGNOSIS — F3341 Major depressive disorder, recurrent, in partial remission: Secondary | ICD-10-CM | POA: Diagnosis not present

## 2015-11-06 DIAGNOSIS — F411 Generalized anxiety disorder: Secondary | ICD-10-CM | POA: Diagnosis not present

## 2015-11-07 ENCOUNTER — Other Ambulatory Visit: Payer: Self-pay | Admitting: Allergy and Immunology

## 2015-11-08 ENCOUNTER — Ambulatory Visit (INDEPENDENT_AMBULATORY_CARE_PROVIDER_SITE_OTHER): Payer: Medicare Other | Admitting: Urology

## 2015-11-08 ENCOUNTER — Encounter: Payer: Self-pay | Admitting: Urology

## 2015-11-08 ENCOUNTER — Other Ambulatory Visit: Payer: Self-pay

## 2015-11-08 VITALS — BP 127/75 | HR 86 | Ht 63.0 in | Wt 281.9 lb

## 2015-11-08 DIAGNOSIS — R32 Unspecified urinary incontinence: Secondary | ICD-10-CM | POA: Diagnosis not present

## 2015-11-08 DIAGNOSIS — N8110 Cystocele, unspecified: Secondary | ICD-10-CM

## 2015-11-08 DIAGNOSIS — J4540 Moderate persistent asthma, uncomplicated: Secondary | ICD-10-CM

## 2015-11-08 DIAGNOSIS — N952 Postmenopausal atrophic vaginitis: Secondary | ICD-10-CM

## 2015-11-08 DIAGNOSIS — IMO0002 Reserved for concepts with insufficient information to code with codable children: Principal | ICD-10-CM

## 2015-11-08 DIAGNOSIS — IMO0001 Reserved for inherently not codable concepts without codable children: Secondary | ICD-10-CM

## 2015-11-08 DIAGNOSIS — R3129 Other microscopic hematuria: Secondary | ICD-10-CM | POA: Diagnosis not present

## 2015-11-08 MED ORDER — BUDESONIDE-FORMOTEROL FUMARATE 160-4.5 MCG/ACT IN AERO: INHALATION_SPRAY | RESPIRATORY_TRACT | Status: DC

## 2015-11-08 NOTE — Progress Notes (Signed)
11:14 AM   Paul Mogul 1950-11-07 [id number]  Referring provider: Roselee Nova, MD [address] [place], [place] [address]  Chief Complaint  Patient presents with  . Bladder Prolapse    Patient thinks her bladder is falling out    HPI: Patient is a 65 year old Caucasian female who presents today to be evaluated for a possible bladder prolapse.  Previous history Patient was given Vesicare 5 mg daily samples for her urinary incontinence.  She has not had any untoward side effects.   Her PVR was minimal on 05/03/2015 exam at 78 mL.  Today, she states the Vesicare 5 mg daily are no longer effective.  She is now experiencing urinary incontinence when she strains, labs or coughs.  She has not had any gross hematuria, dysuria or suprapubic pain.    She is feeling a bulge between her legs and visualizes something popping out.  Atrophic vaginitis: She has been using the vaginal estrogen cream as prescribed.   PMH: Past Medical History  Diagnosis Date  . Glaucoma   . HLD (hyperlipidemia)   . Diverticulitis   . Sleep apnea   . Vaginal yeast infection   . Osteoarthritis     lumbar spine  . Acid reflux   . Microscopic hematuria   . Mixed incontinence   . Morbid obesity ([place])   . Anxiety and depression     Surgical History: Past Surgical History  Procedure Laterality Date  . Abdominal hysterectomy  1998  . Back surgery  1990/2014  . Cataract extraction, bilateral  2014  . Lithotripsy  2012  . Deviated nose septum surgery  2000    Home Medications:    Medication List       This list is accurate as of: 11/08/15 11:14 AM.  Always use your most recent med list.               albuterol 108 (90 Base) MCG/ACT inhaler  Commonly known as:  PROVENTIL HFA;VENTOLIN HFA  Use 2 puffs every 4 hours as needed to prevent cough or wheeze.  May use 2 puffs 10-20 minutes prior to exercise.  Use with spacer.     atorvastatin 20 MG tablet  Commonly known as:   LIPITOR  Take 1 tablet (20 mg total) by mouth daily. bedtime     budesonide-formoterol 160-4.5 MCG/ACT inhaler  Commonly known as:  SYMBICORT  Use 2 puffs twice daily to prevent cough or wheeze.  Rinse, gargle, and spit after use.     cetirizine 10 MG tablet  Commonly known as:  ZYRTEC  TAKE 1 TABLET BY MOUTH AT BEDTIME     clotrimazole 10 MG troche  Commonly known as:  MYCELEX  Take 1 tablet (10 mg total) by mouth 5 (five) times daily. Slowly dissolve in the mouth.  Do not chew or swallow whole.     doxycycline 100 MG tablet  Commonly known as:  VIBRA-TABS  Take 1 tablet (100 mg total) by mouth daily.     DULoxetine 30 MG capsule  Commonly known as:  CYMBALTA  Take 30 mg by mouth daily.     fluconazole 150 MG tablet  Commonly known as:  DIFLUCAN  Take 1 tablet (150 mg total) by mouth once. May repeat once 3 days later     fluticasone 50 MCG/ACT nasal spray  Commonly known as:  FLONASE  Use 1-2 sprays in each nostril 1-2 times daily as needed for stuffy nose or drainage.  hydrOXYzine 25 MG tablet  Commonly known as:  ATARAX/VISTARIL  Take 1 tablet (25 mg total) by mouth 3 (three) times daily as needed.     latanoprost 0.005 % ophthalmic solution  Commonly known as:  XALATAN  Apply 0.005 drops to eye at bedtime.     meloxicam 15 MG tablet  Commonly known as:  MOBIC  Take 1 tablet (15 mg total) by mouth daily.     montelukast 10 MG tablet  Commonly known as:  SINGULAIR  Take one daily in the evening to prevent cough or wheeze.     moxifloxacin 400 MG tablet  Commonly known as:  AVELOX  Take 1 tablet (400 mg total) by mouth daily.     MULTIPLE VITAMIN PO  Take by mouth daily.     mupirocin ointment 2 %  Commonly known as:  BACTROBAN  Apply 1 application topically 2 (two) times daily.     OMEGA-3 FATTY ACIDS PO  Take by mouth 2 (two) times daily. Reported on 11/08/2015     omeprazole 20 MG capsule  Commonly known as:  PRILOSEC  Take 1 capsule (20 mg total)  by mouth 2 (two) times daily before a meal.     PATADAY 0.2 % Soln  Generic drug:  Olopatadine HCl  Apply one drop in each eye once daily as needed for itchy eyes.     predniSONE 20 MG tablet  Commonly known as:  DELTASONE  Take 1 tablet (20 mg total) by mouth 2 (two) times daily with a meal.     PREMARIN vaginal cream  Generic drug:  conjugated estrogens     solifenacin 5 MG tablet  Commonly known as:  VESICARE  Take 5 mg by mouth daily. Reported on 11/08/2015     triamcinolone ointment 0.1 %  Commonly known as:  KENALOG  Apply 1 application topically daily.     Vitamin C Chew  Chew by mouth daily.     Vitamin D (Ergocalciferol) 50000 units Caps capsule  Commonly known as:  DRISDOL  TK 1 C PO ONCE WEEKLY        Allergies:  Allergies  Allergen Reactions  . Morphine Hives    Family History: Family History  Problem Relation Age of Onset  . Heart disease Mother   . Diabetes Mother   . Cancer Father     lung cancer  . COPD Father   . Cancer Sister     Brain Cancer  . Kidney disease Mother   . Hematuria Daughter   . Liver cancer Father   . Bladder Cancer Neg Hx     Social History:  reports that she has never smoked. She has never used smokeless tobacco. She reports that she does not drink alcohol or use illicit drugs.  ROS: Urological Symptom Review  Patient is experiencing the following symptoms: Hard to postpone urination Leakage of urine   Review of Systems  Gastrointestinal (upper) : Negative for upper GI symptoms  Gastrointestinal (lower) : Negative for lower GI symptoms  Constitutional : Negative for symptoms  Skin: Negative for skin symptoms  Eyes: Negative for eye symptoms  Ear/Nose/Throat : Negative for Ear/Nose/Throat symptoms  Hematologic/Lymphatic: Negative for Hematologic/Lymphatic symptoms  Cardiovascular : Negative for cardiovascular symptoms  Respiratory : Cough Shortness of breath  Endocrine: Excessive  thirst  Musculoskeletal: Joint pain  Neurological: Negative for neurological symptoms  Psychologic: Negative for psychiatric symptoms   Physical Exam: Blood pressure 127/75, pulse 86, height 5\' 3"  (1.6 m),  weight 281 lb 14.4 oz (127.869 kg). GU:  Atrophic external genitalia, normal pubic hair distribution, no lesions.  Normal urethral meatus, no lesions, no prolapse, no discharge.   No urethral masses, tenderness and/or tenderness. No bladder fullness, tenderness or masses. Normal vagina mucosa, good estrogen effect, no discharge, no lesions, good pelvic support, Grade II cystocele is noted.  Rectocele is noted.  Cervix, uterus and adnexa are surgically absent.    Anus and perineum are without rashes or lesions.      Assessment & Plan:    1. Cystocele:   We discussed conservative management, PT, pessary fitting and surgical correction.   She is not interested in conservative management or PT.  She would not be a good candidate for surgical correction due to her weight.  She would like a referral to the gynecologist for a pessary fitting.  She has seen Dr. Armandina Gemma in the past, so we will refer to her for the fitting.    2. Incontinence:   Patient is no longer taking the Vesicare due to its lack of effectiveness.  We will address this again once she has been evaluated for a pessary.  3. Atrophic vaginitis:   Patient is using the vaginal estrogen cream as prescribed.    4. Microscopic hematuria:   She was found to have microscopic hematuria on a clean catch specimen in June 2016. A catheterized specimen performed that day did not demonstrate microscopic hematuria. Patient's urinalysis from August 2016 did not demonstrate hematuria.  We will continue to monitor.  We'll obtain a UA when she presents after her pessary evaluation.  Return for refer to gynecology for pessary fitting.  Zara Council, [facility] [address] [place], [place]  [address] [phone]

## 2015-11-08 NOTE — Telephone Encounter (Signed)
Refill of symbicort sent to pharmacy

## 2015-11-12 ENCOUNTER — Ambulatory Visit: Payer: Medicaid Other | Admitting: Family Medicine

## 2015-11-14 DIAGNOSIS — G4733 Obstructive sleep apnea (adult) (pediatric): Secondary | ICD-10-CM | POA: Diagnosis not present

## 2015-11-19 ENCOUNTER — Ambulatory Visit (INDEPENDENT_AMBULATORY_CARE_PROVIDER_SITE_OTHER): Payer: Medicare Other | Admitting: Family Medicine

## 2015-11-19 ENCOUNTER — Encounter: Payer: Self-pay | Admitting: Family Medicine

## 2015-11-19 VITALS — BP 128/78 | HR 103 | Temp 98.2°F | Resp 18 | Ht 63.0 in | Wt 286.6 lb

## 2015-11-19 DIAGNOSIS — K224 Dyskinesia of esophagus: Secondary | ICD-10-CM | POA: Diagnosis not present

## 2015-11-19 DIAGNOSIS — E785 Hyperlipidemia, unspecified: Secondary | ICD-10-CM | POA: Diagnosis not present

## 2015-11-19 NOTE — Progress Notes (Signed)
Name: Faith Padilla   MRN: [id number]    DOB: [birth year]   Date:11/19/2015       Progress Note  Subjective  Chief Complaint  Chief Complaint  Patient presents with  . Follow-up    3 mo  . Hyperlipidemia    Hyperlipidemia This is a chronic problem. The problem is controlled. Recent lipid tests were reviewed and are normal. Exacerbating diseases include obesity. Pertinent negatives include no chest pain, leg pain, myalgias or shortness of breath. Current antihyperlipidemic treatment includes statins.   Esophageal Spasms Pt. Is here for an evaluation of esophageal and stomach spasms, present for 2 months. She was diagnosed with esophageal spasms 15 years ago while asymptomatic. She feels that when she eats rice or pasta, she feels like she is choking, the food is not moving along, starts having stomach pains etc. No difficulty swallowing, weight loss, black colored stools, or anemia.    Past Medical History  Diagnosis Date  . Glaucoma   . HLD (hyperlipidemia)   . Diverticulitis   . Sleep apnea   . Vaginal yeast infection   . Osteoarthritis     lumbar spine  . Acid reflux   . Microscopic hematuria   . Mixed incontinence   . Morbid obesity ([place])   . Anxiety and depression     Past Surgical History  Procedure Laterality Date  . Abdominal hysterectomy  1998  . Back surgery  1990/2014  . Cataract extraction, bilateral  2014  . Lithotripsy  2012  . Deviated nose septum surgery  2000    Family History  Problem Relation Age of Onset  . Heart disease Mother   . Diabetes Mother   . Cancer Father     lung cancer  . COPD Father   . Cancer Sister     Brain Cancer  . Kidney disease Mother   . Hematuria Daughter   . Liver cancer Father   . Bladder Cancer Neg Hx     Social History   Social History  . Marital Status: Single    Spouse Name: N/A  . Number of Children: N/A  . Years of Education: N/A   Occupational History  . Not on file.   Social History Main Topics   . Smoking status: Never Smoker   . Smokeless tobacco: Never Used  . Alcohol Use: No  . Drug Use: No  . Sexual Activity: Not on file   Other Topics Concern  . Not on file   Social History Narrative     Current outpatient prescriptions:  .  albuterol (PROVENTIL HFA;VENTOLIN HFA) 108 (90 BASE) MCG/ACT inhaler, Use 2 puffs every 4 hours as needed to prevent cough or wheeze.  May use 2 puffs 10-20 minutes prior to exercise.  Use with spacer., Disp: 2 Inhaler, Rfl: 1 .  atorvastatin (LIPITOR) 20 MG tablet, Take 1 tablet (20 mg total) by mouth daily. bedtime, Disp: 90 tablet, Rfl: 0 .  Bioflavonoid Products (VITAMIN C) CHEW, Chew by mouth daily., Disp: , Rfl:  .  budesonide-formoterol (SYMBICORT) 160-4.5 MCG/ACT inhaler, Use 2 puffs twice daily to prevent cough or wheeze.  Rinse, gargle, and spit after use., Disp: 10.2 g, Rfl: 3 .  cetirizine (ZYRTEC) 10 MG tablet, TAKE 1 TABLET BY MOUTH AT BEDTIME, Disp: 30 tablet, Rfl: 4 .  clotrimazole (MYCELEX) 10 MG troche, Take 1 tablet (10 mg total) by mouth 5 (five) times daily. Slowly dissolve in the mouth.  Do not chew or swallow whole., Disp: 50 tablet,  Rfl: 0 .  doxycycline (VIBRA-TABS) 100 MG tablet, Take 1 tablet (100 mg total) by mouth daily., Disp: 7 tablet, Rfl: 0 .  DULoxetine (CYMBALTA) 30 MG capsule, Take 30 mg by mouth daily., Disp: , Rfl:  .  fluconazole (DIFLUCAN) 150 MG tablet, Take 1 tablet (150 mg total) by mouth once. May repeat once 3 days later, Disp: 2 tablet, Rfl: 0 .  fluticasone (FLONASE) 50 MCG/ACT nasal spray, Use 1-2 sprays in each nostril 1-2 times daily as needed for stuffy nose or drainage., Disp: 17 g, Rfl: 5 .  hydrOXYzine (ATARAX/VISTARIL) 25 MG tablet, Take 1 tablet (25 mg total) by mouth 3 (three) times daily as needed., Disp: 30 tablet, Rfl: 0 .  latanoprost (XALATAN) 0.005 % ophthalmic solution, Apply 0.005 drops to eye at bedtime., Disp: , Rfl:  .  meloxicam (MOBIC) 15 MG tablet, Take 1 tablet (15 mg total) by mouth  daily., Disp: 90 tablet, Rfl: 0 .  montelukast (SINGULAIR) 10 MG tablet, Take one daily in the evening to prevent cough or wheeze., Disp: 30 tablet, Rfl: 5 .  moxifloxacin (AVELOX) 400 MG tablet, Take 1 tablet (400 mg total) by mouth daily., Disp: 7 tablet, Rfl: 0 .  MULTIPLE VITAMIN PO, Take by mouth daily., Disp: , Rfl:  .  mupirocin ointment (BACTROBAN) 2 %, Apply 1 application topically 2 (two) times daily., Disp: 22 g, Rfl: 2 .  OMEGA-3 FATTY ACIDS PO, Take by mouth 2 (two) times daily. Reported on 11/08/2015, Disp: , Rfl:  .  omeprazole (PRILOSEC) 20 MG capsule, Take 1 capsule (20 mg total) by mouth 2 (two) times daily before a meal., Disp: 180 capsule, Rfl: 0 .  PATADAY 0.2 % SOLN, Apply one drop in each eye once daily as needed for itchy eyes., Disp: 1 Bottle, Rfl: 5 .  predniSONE (DELTASONE) 20 MG tablet, Take 1 tablet (20 mg total) by mouth 2 (two) times daily with a meal., Disp: 10 tablet, Rfl: 0 .  PREMARIN vaginal cream, , Disp: , Rfl: 3 .  triamcinolone ointment (KENALOG) 0.1 %, Apply 1 application topically daily., Disp: 30 g, Rfl: 0 .  solifenacin (VESICARE) 5 MG tablet, Take 5 mg by mouth daily. Reported on 11/19/2015, Disp: , Rfl:  .  Vitamin D, Ergocalciferol, (DRISDOL) 50000 UNITS CAPS capsule, Reported on 11/19/2015, Disp: , Rfl: 4  Current facility-administered medications:  .  predniSONE (DELTASONE) tablet 10 mg, 10 mg, Oral, UD, Adelina Mings, MD  Allergies  Allergen Reactions  . Morphine Hives     Review of Systems  Respiratory: Negative for shortness of breath.   Cardiovascular: Negative for chest pain.  Musculoskeletal: Negative for myalgias.    Objective  Filed Vitals:   11/19/15 1521  BP: 128/78  Pulse: 103  Temp: 98.2 F (36.8 C)  TempSrc: Oral  Resp: 18  Height: 5\' 3"  (1.6 m)  Weight: 286 lb 9.6 oz (130.001 kg)  SpO2: 97%    Physical Exam  Constitutional: She is oriented to person, place, and time and well-developed, well-nourished, and  in no distress.  HENT:  Head: Normocephalic and atraumatic.  Cardiovascular: Normal rate and regular rhythm.   Pulmonary/Chest: Effort normal and breath sounds normal.  Abdominal: Soft. Bowel sounds are normal.  Neurological: She is alert and oriented to person, place, and time.  Nursing note and vitals reviewed.     Assessment & Plan  1. Esophageal motility disorder Referral to gastroenterology for possible endoscopy. - Ambulatory referral to Gastroenterology  2. Hyperlipidemia  -  Lipid Profile   Frandy Basnett [facility] 11/19/2015 3:26 PM

## 2015-11-20 DIAGNOSIS — E785 Hyperlipidemia, unspecified: Secondary | ICD-10-CM | POA: Diagnosis not present

## 2015-11-20 DIAGNOSIS — F3341 Major depressive disorder, recurrent, in partial remission: Secondary | ICD-10-CM | POA: Diagnosis not present

## 2015-11-20 DIAGNOSIS — F4310 Post-traumatic stress disorder, unspecified: Secondary | ICD-10-CM | POA: Diagnosis not present

## 2015-11-21 ENCOUNTER — Telehealth: Payer: Self-pay | Admitting: Gastroenterology

## 2015-11-21 DIAGNOSIS — R3129 Other microscopic hematuria: Secondary | ICD-10-CM | POA: Insufficient documentation

## 2015-11-21 DIAGNOSIS — IMO0002 Reserved for concepts with insufficient information to code with codable children: Principal | ICD-10-CM

## 2015-11-21 DIAGNOSIS — IMO0001 Reserved for inherently not codable concepts without codable children: Secondary | ICD-10-CM | POA: Insufficient documentation

## 2015-11-21 LAB — LIPID PANEL
CHOL/HDL RATIO: 4 ratio (ref 0.0–4.4)
CHOLESTEROL TOTAL: 152 mg/dL (ref 100–199)
HDL: 38 mg/dL — ABNORMAL LOW (ref >39)
LDL CALC: 87 mg/dL (ref 0–99)
Triglycerides: 133 mg/dL (ref 0–149)
VLDL Cholesterol Cal: 27 mg/dL (ref 5–40)

## 2015-11-21 NOTE — Telephone Encounter (Signed)
Pt stated she would like to keep her current appt. If things worsen, she will call back. Right now she is doing ok.

## 2015-11-21 NOTE — Telephone Encounter (Signed)
Ginger,   We received a GI referral on this patient from Dr Manuella Ghazi for Esophageal motility disorder. I scheduled the patient an appointment for Tuesday April 18th 2:00pm with Dr Allen Norris. After talking to the patient about her symptoms, she states when she eats food is getting stuck in her throat/esophagas and she starts having abdominal spasms. Are we able to move her up to a more urgent appointment?

## 2015-11-26 ENCOUNTER — Ambulatory Visit: Payer: Medicare Other | Admitting: Cardiovascular Disease

## 2015-12-04 DIAGNOSIS — F3341 Major depressive disorder, recurrent, in partial remission: Secondary | ICD-10-CM | POA: Diagnosis not present

## 2015-12-04 DIAGNOSIS — F4310 Post-traumatic stress disorder, unspecified: Secondary | ICD-10-CM | POA: Diagnosis not present

## 2015-12-05 ENCOUNTER — Other Ambulatory Visit: Payer: Self-pay | Admitting: Family Medicine

## 2015-12-05 NOTE — Telephone Encounter (Signed)
Medication has been refilled and sent to [facility] [address] 

## 2015-12-06 ENCOUNTER — Ambulatory Visit (INDEPENDENT_AMBULATORY_CARE_PROVIDER_SITE_OTHER): Payer: Medicare Other | Admitting: Obstetrics and Gynecology

## 2015-12-06 ENCOUNTER — Encounter: Payer: Self-pay | Admitting: Obstetrics and Gynecology

## 2015-12-06 VITALS — BP 98/65 | HR 118 | Ht 63.0 in | Wt 282.0 lb

## 2015-12-06 DIAGNOSIS — B372 Candidiasis of skin and nail: Secondary | ICD-10-CM

## 2015-12-06 DIAGNOSIS — N952 Postmenopausal atrophic vaginitis: Secondary | ICD-10-CM

## 2015-12-06 DIAGNOSIS — Z87442 Personal history of urinary calculi: Secondary | ICD-10-CM

## 2015-12-06 DIAGNOSIS — N3946 Mixed incontinence: Secondary | ICD-10-CM

## 2015-12-06 DIAGNOSIS — M545 Low back pain, unspecified: Secondary | ICD-10-CM

## 2015-12-06 DIAGNOSIS — N8110 Cystocele, unspecified: Secondary | ICD-10-CM

## 2015-12-06 DIAGNOSIS — N816 Rectocele: Secondary | ICD-10-CM | POA: Diagnosis not present

## 2015-12-06 LAB — POCT URINALYSIS DIPSTICK
BILIRUBIN UA: NEGATIVE
Glucose, UA: NEGATIVE
Ketones, UA: NEGATIVE
Leukocytes, UA: NEGATIVE
Nitrite, UA: NEGATIVE
PH UA: 6.5
Spec Grav, UA: 1.02
UROBILINOGEN UA: NEGATIVE

## 2015-12-06 MED ORDER — NYSTATIN-TRIAMCINOLONE 100000-0.1 UNIT/GM-% EX OINT
1.0000 | TOPICAL_OINTMENT | Freq: Two times a day (BID) | CUTANEOUS | Status: DC
Start: 2015-12-06 — End: 2023-05-07

## 2015-12-06 NOTE — Progress Notes (Signed)
GYNECOLOGY CLINIC PROGRESS NOTE  Subjective:    Faith Padilla is a 65 y.o. female who presents for evaluation and management of a cystocele.  Referred from [place]. Has been undergoing workup for microscopic hematuria.  Also was taking Vesicare for urinary incontinence, however is no longer effective. Problem started several months ago. Patient reports that she was initiated on Premarin cream for vaginal atrophy, and began to feel a bulge in the vagina when trying to use the applicator.   In addition, patient notes that she has been experiencing urinary urgency and leakage for over 1 year. Is also now noting leakage with coughing, laughing, sneezing.  Notes that symptoms have progressively worsened. Also notes bowel disturbances (alternating constipation, diarrhea, but denies fecal incontinence), however patient also with h/o diverticulitis.   Menstrual History: Menarche age: 59 No LMP recorded. Patient has had a hysterectomy.      Past Medical History  Diagnosis Date  . Glaucoma   . HLD (hyperlipidemia)   . Diverticulitis   . Sleep apnea   . Vaginal yeast infection   . Osteoarthritis     lumbar spine  . Acid reflux   . Microscopic hematuria   . Mixed incontinence   . Morbid obesity ([place])   . Anxiety and depression     Family History  Problem Relation Age of Onset  . Heart disease Mother   . Diabetes Mother   . Cancer Father     lung cancer  . COPD Father   . Cancer Sister     Brain Cancer  . Kidney disease Mother   . Hematuria Daughter   . Liver cancer Father   . Bladder Cancer Neg Hx     Past Surgical History  Procedure Laterality Date  . Abdominal hysterectomy  1998  . Back surgery  1990/2014  . Cataract extraction, bilateral  2014  . Lithotripsy  2012  . Deviated nose septum surgery  2000    Social History   Social History  . Marital Status: Single    Spouse Name: N/A  . Number of Children: N/A  . Years of Education: N/A    Occupational History  . Not on file.   Social History Main Topics  . Smoking status: Never Smoker   . Smokeless tobacco: Never Used  . Alcohol Use: No  . Drug Use: No  . Sexual Activity: No   Other Topics Concern  . Not on file   Social History Narrative    Current Outpatient Prescriptions on File Prior to Visit  Medication Sig Dispense Refill  . albuterol (PROVENTIL HFA;VENTOLIN HFA) 108 (90 BASE) MCG/ACT inhaler Use 2 puffs every 4 hours as needed to prevent cough or wheeze.  May use 2 puffs 10-20 minutes prior to exercise.  Use with spacer. 2 Inhaler 1  . atorvastatin (LIPITOR) 20 MG tablet Take 1 tablet (20 mg total) by mouth at bedtime. 90 tablet 0  . Bioflavonoid Products (VITAMIN C) CHEW Chew by mouth daily.    . budesonide-formoterol (SYMBICORT) 160-4.5 MCG/ACT inhaler Use 2 puffs twice daily to prevent cough or wheeze.  Rinse, gargle, and spit after use. 10.2 g 3  . cetirizine (ZYRTEC) 10 MG tablet TAKE 1 TABLET BY MOUTH AT BEDTIME 30 tablet 4  . clotrimazole (MYCELEX) 10 MG troche Take 1 tablet (10 mg total) by mouth 5 (five) times daily. Slowly dissolve in the mouth.  Do not chew or swallow whole. 50 tablet 0  . doxycycline (VIBRA-TABS) 100 MG  tablet Take 1 tablet (100 mg total) by mouth daily. 7 tablet 0  . DULoxetine (CYMBALTA) 30 MG capsule Take 30 mg by mouth daily.    . fluconazole (DIFLUCAN) 150 MG tablet Take 1 tablet (150 mg total) by mouth once. May repeat once 3 days later 2 tablet 0  . fluticasone (FLONASE) 50 MCG/ACT nasal spray Use 1-2 sprays in each nostril 1-2 times daily as needed for stuffy nose or drainage. 17 g 5  . hydrOXYzine (ATARAX/VISTARIL) 25 MG tablet Take 1 tablet (25 mg total) by mouth 3 (three) times daily as needed. 30 tablet 0  . latanoprost (XALATAN) 0.005 % ophthalmic solution Apply 0.005 drops to eye at bedtime.    . meloxicam (MOBIC) 15 MG tablet Take 1 tablet (15 mg total) by mouth daily. 90 tablet 0  . montelukast (SINGULAIR) 10 MG  tablet Take one daily in the evening to prevent cough or wheeze. 30 tablet 5  . moxifloxacin (AVELOX) 400 MG tablet Take 1 tablet (400 mg total) by mouth daily. 7 tablet 0  . MULTIPLE VITAMIN PO Take by mouth daily.    . mupirocin ointment (BACTROBAN) 2 % Apply 1 application topically 2 (two) times daily. 22 g 2  . OMEGA-3 FATTY ACIDS PO Take by mouth 2 (two) times daily. Reported on 11/08/2015    . omeprazole (PRILOSEC) 20 MG capsule Take 1 capsule (20 mg total) by mouth 2 (two) times daily before a meal. 180 capsule 0  . PATADAY 0.2 % SOLN Apply one drop in each eye once daily as needed for itchy eyes. 1 Bottle 5  . predniSONE (DELTASONE) 20 MG tablet Take 1 tablet (20 mg total) by mouth 2 (two) times daily with a meal. 10 tablet 0  . PREMARIN vaginal cream   3  . solifenacin (VESICARE) 5 MG tablet Take 5 mg by mouth daily. Reported on 11/19/2015    . triamcinolone ointment (KENALOG) 0.1 % Apply 1 application topically daily. 30 g 0  . Vitamin D, Ergocalciferol, (DRISDOL) 50000 UNITS CAPS capsule Reported on 11/19/2015  4   Current Facility-Administered Medications on File Prior to Visit  Medication Dose Route Frequency Provider Last Rate Last Dose  . predniSONE (DELTASONE) tablet 10 mg  10 mg Oral UD Adelina Mings, MD        Allergies  Allergen Reactions  . Morphine Hives    Review of Systems A comprehensive review of systems was negative except for: Genitourinary: positive for what's noted in HPI, and h/o kidney stones Integument/breast: positive for rash and itching under bilateral breasts.  Is macular rash with erythema Musculoskeletal: positive for back pain   Objective:     BP 98/65 mmHg  Pulse 118  Ht 5\' 3"  (1.6 m)  Wt 282 lb (127.914 kg)  BMI 49.97 kg/m2 General Appearance:  alert and no distress; morbidly obese  Abdomen:  Soft, nontender, no masses or organomegaly  Pelvis:  External genitalia: normal general appearance Urinary system: urethral meatus normal and  bladder not palpable Vaginal: atrophic mucosa, cystocele present, 2nd degree and rectocele present, 2nd degree Cervix: removed surgically Adnexa: non palpable and no tenderness bilaterally Uterus: removed surgically Rectal: good sphincter tone and no masses  Extremities:  nontender, no erythema or edema  Neurologic:   Grossly intact     Assessment:    The patient has a cystocele and rectocele   Urinary incontinence (mixed) Atrophic vagina H/o kidney stones Back pain Yeast dermatitis under bilateral breasts  Plan:  Discussed cystoceles/rectoceles and management options with the patient. All questions answered. Also discussed obesity as a risk factor for current symptoms.  Pessary fitting performed today.  Will order Size 2 ring with support.  UA performed today at patient request as she notes back pain, concerned for UTI and possible kidney stone formation due to history.  Continue use of premarin cream for vaginal atrophy.  Urinary incontinence (mixed).  Patient discontinued Vesicare last month due to being ineffective. To be reassessed by BUA once pessary in place.  Will treat yeast dermatitis with Mycolog ointment.  Follow up in 2 weeks for pessary insertion, or as needed.     Rubie Maid, MD [facility]

## 2015-12-11 ENCOUNTER — Other Ambulatory Visit: Payer: Self-pay | Admitting: Allergy and Immunology

## 2015-12-17 ENCOUNTER — Ambulatory Visit (INDEPENDENT_AMBULATORY_CARE_PROVIDER_SITE_OTHER): Payer: Medicare Other | Admitting: Pediatrics

## 2015-12-17 ENCOUNTER — Encounter: Payer: Self-pay | Admitting: Pediatrics

## 2015-12-17 VITALS — BP 122/60 | HR 88 | Temp 97.8°F | Resp 18

## 2015-12-17 DIAGNOSIS — J4540 Moderate persistent asthma, uncomplicated: Secondary | ICD-10-CM

## 2015-12-17 DIAGNOSIS — K219 Gastro-esophageal reflux disease without esophagitis: Secondary | ICD-10-CM | POA: Diagnosis not present

## 2015-12-17 DIAGNOSIS — J0100 Acute maxillary sinusitis, unspecified: Secondary | ICD-10-CM

## 2015-12-17 DIAGNOSIS — J3089 Other allergic rhinitis: Secondary | ICD-10-CM | POA: Diagnosis not present

## 2015-12-17 DIAGNOSIS — H1045 Other chronic allergic conjunctivitis: Secondary | ICD-10-CM

## 2015-12-17 DIAGNOSIS — H1010 Acute atopic conjunctivitis, unspecified eye: Secondary | ICD-10-CM | POA: Insufficient documentation

## 2015-12-17 MED ORDER — MONTELUKAST SODIUM 10 MG PO TABS: ORAL_TABLET | ORAL | Status: DC

## 2015-12-17 MED ORDER — PATADAY 0.2 % OP SOLN: OPHTHALMIC | Status: DC

## 2015-12-17 MED ORDER — BUDESONIDE-FORMOTEROL FUMARATE 160-4.5 MCG/ACT IN AERO: INHALATION_SPRAY | RESPIRATORY_TRACT | Status: DC

## 2015-12-17 NOTE — Progress Notes (Addendum)
  [address] [place] [address] Dept: [phone]  FOLLOW UP NOTE  Patient ID: Faith Padilla, female    DOB: Jun 28, 1951  Age: 65 y.o. MRN: [id number] Date of Office Visit: 12/17/2015  Assessment Chief Complaint: Nasal Congestion and Cough  HPI Faith Padilla presents for follow-up of her asthma and allergic rhinitis. . She has had a mild cough. Occasionally she has itchy eyes.. She does have nasal congestion at times.  Current medications are Symbicort will 160-2 puffs twice a day, Proventil 2 puffs every 4 hours if needed, Zyrtec 10 mg once a day, fluticasone 1 spray per nostril twice a day, montelukast  10 mg once a day. Her other medications are outlined in the chart   Drug Allergies:  Allergies  Allergen Reactions  . Morphine Hives    Physical Exam: BP 122/60 mmHg  Pulse 88  Temp(Src) 97.8 F (36.6 C) (Oral)  Resp 18   Physical Exam  Constitutional: She is oriented to person, place, and time. She appears well-developed and well-nourished.  HENT:  Eyes showed conjunctival erythema. Ears normal. Nose mild swelling of his turbinates. Pharynx normal.  Neck: Neck supple.  Cardiovascular:  S1 and S2 normal no murmurs  Pulmonary/Chest:  Clear to percussion auscultation  Lymphadenopathy:    She has no cervical adenopathy.  Neurological: She is alert and oriented to person, place, and time.  Psychiatric: She has a normal mood and affect. Her behavior is normal. Judgment and thought content normal.  Vitals reviewed.   Diagnostics:  FVC 2.34 L FEV1 2.07 L. Predicted FVC 2.80 L predicted FEV1 2.23 L-the spirometry is in the normal range  Assessment and Plan:    2. Moderate persistent asthma, uncomplicated   3. Gastroesophageal reflux disease without esophagitis   4. Other allergic rhinitis     Meds ordered this encounter  Medications  . montelukast (SINGULAIR) 10 MG tablet    Sig: Take one daily in the evening to prevent cough or wheeze.    Dispense:   90 tablet    Refill:  1  . budesonide-formoterol (SYMBICORT) 160-4.5 MCG/ACT inhaler    Sig: Use 2 puffs twice daily to prevent cough or wheeze.  Rinse, gargle, and spit after use.    Dispense:  3 Inhaler    Refill:  1    Covered under patients plan  . PATADAY 0.2 % SOLN    Sig: Apply one drop in each eye once daily as needed for itchy eyes.    Dispense:  3 Bottle    Refill:  1    Patient Instructions  Continue on your current medications Call us if you're not doing well on this treatment plan    Return in about 3 months (around 03/17/2016).    Thank you for the opportunity to care for this patient.  Please do not hesitate to contact me with questions.  Penne Lash, M.D.  [facility] [address] [place], [place] [address] [phone]

## 2015-12-17 NOTE — Patient Instructions (Signed)
Continue on your current medications Call us if you're not doing well on this treatment plan

## 2015-12-18 DIAGNOSIS — F411 Generalized anxiety disorder: Secondary | ICD-10-CM | POA: Diagnosis not present

## 2015-12-18 DIAGNOSIS — F3341 Major depressive disorder, recurrent, in partial remission: Secondary | ICD-10-CM | POA: Diagnosis not present

## 2015-12-24 ENCOUNTER — Telehealth: Payer: Self-pay | Admitting: *Deleted

## 2015-12-24 MED ORDER — AZELASTINE HCL 0.05 % OP SOLN: 1.0000 [drp] | Freq: Two times a day (BID) | OPHTHALMIC | Status: DC

## 2015-12-24 NOTE — Telephone Encounter (Signed)
Left message for patient to call office. Sent script Azelastine 1 drop BID because insurance will no longer pay for Pataday.

## 2015-12-24 NOTE — Telephone Encounter (Signed)
Pt informed

## 2015-12-25 ENCOUNTER — Ambulatory Visit: Payer: Medicare Other | Admitting: Gastroenterology

## 2015-12-26 ENCOUNTER — Encounter: Payer: Self-pay | Admitting: Gastroenterology

## 2015-12-26 ENCOUNTER — Other Ambulatory Visit: Payer: Self-pay

## 2015-12-26 ENCOUNTER — Ambulatory Visit (INDEPENDENT_AMBULATORY_CARE_PROVIDER_SITE_OTHER): Payer: Medicare Other | Admitting: Gastroenterology

## 2015-12-26 VITALS — BP 125/58 | HR 85 | Temp 98.3°F | Ht 63.0 in | Wt 287.0 lb

## 2015-12-26 DIAGNOSIS — R1013 Epigastric pain: Secondary | ICD-10-CM | POA: Diagnosis not present

## 2015-12-26 DIAGNOSIS — R109 Unspecified abdominal pain: Secondary | ICD-10-CM | POA: Diagnosis not present

## 2015-12-26 DIAGNOSIS — R1310 Dysphagia, unspecified: Secondary | ICD-10-CM

## 2015-12-26 NOTE — Progress Notes (Signed)
Gastroenterology Consultation  Referring Provider:     Roselee Nova, MD Primary Care Physician:  Keith Rake, MD Primary Gastroenterologist:  Dr. Allen Norris     Reason for Consultation:     Dysphagia and abdominal pain        HPI:   Faith Padilla is a 65 y.o. y/o female referred for consultation & management of Dysphagia and abdominal pain by Dr. Keith Rake, MD.  As patient comes in today with a history of colon polyps with her last colonoscopy 5 years ago. The patient was told that she needed a repeat colonoscopy in 3 years. The patient also reports that she has abdominal pain on both the right and left side of her upper abdomen after she eats. The patient also states that she has had multiple episodes of diverticulitis in the past but is presently not having any fevers chills or constant pain. There is also no report of any unexplained weight loss. The patient has not had any black stools or bloody stools. The patient dysphagia has been going on for last month. The patient states that the dysphagia is to solids more than liquids. She denies the food ever getting stuck where she could not get it down nor has she had to vomit the food back up after eating it.    Past Medical History  Diagnosis Date  . Glaucoma   . HLD (hyperlipidemia)   . Diverticulitis   . Sleep apnea   . Vaginal yeast infection   . Osteoarthritis     lumbar spine  . Acid reflux   . Microscopic hematuria   . Mixed incontinence   . Morbid obesity ([place])   . Anxiety and depression   . Asthma     Past Surgical History  Procedure Laterality Date  . Abdominal hysterectomy  1998  . Back surgery  1990/2014  . Cataract extraction, bilateral  2014  . Lithotripsy  2012  . Deviated nose septum surgery  2000    Prior to Admission medications   Medication Sig Start Date End Date Taking? Authorizing Provider  albuterol (PROVENTIL HFA;VENTOLIN HFA) 108 (90 BASE) MCG/ACT inhaler Use 2 puffs every 4 hours as needed to prevent  cough or wheeze.  May use 2 puffs 10-20 minutes prior to exercise.  Use with spacer. 07/18/15  Yes Adelina Mings, MD  atorvastatin (LIPITOR) 20 MG tablet Take 1 tablet (20 mg total) by mouth at bedtime. 12/05/15  Yes Roselee Nova, MD  azelastine (OPTIVAR) 0.05 % ophthalmic solution Place 1 drop into both eyes 2 (two) times daily. 12/24/15  Yes Charlies Silvers, MD  Bioflavonoid Products (VITAMIN C) CHEW Chew by mouth daily. Reported on 12/17/2015   Yes Historical Provider, MD  budesonide-formoterol (SYMBICORT) 160-4.5 MCG/ACT inhaler Use 2 puffs twice daily to prevent cough or wheeze.  Rinse, gargle, and spit after use. 12/17/15  Yes Charlies Silvers, MD  cetirizine (ZYRTEC) 10 MG tablet TAKE 1 TABLET BY MOUTH AT BEDTIME 11/08/15  Yes Adelina Mings, MD  clotrimazole (MYCELEX) 10 MG troche Take 1 tablet (10 mg total) by mouth 5 (five) times daily. Slowly dissolve in the mouth.  Do not chew or swallow whole. 10/15/15  Yes Adelina Mings, MD  co-enzyme Q-10 30 MG capsule Take 30 mg by mouth 3 (three) times daily.   Yes Historical Provider, MD  fluconazole (DIFLUCAN) 150 MG tablet Take 1 tablet (150 mg total) by mouth once. May repeat once 3 days  later 06/18/15  Yes Charlies Silvers, MD  fluticasone (FLONASE) 50 MCG/ACT nasal spray INSTILL 1 TO 2 SPRAYS INTO EACH NOSTRIL 1 TO 2 TIMES DAILY AS NEEDED 12/11/15  Yes Adelina Mings, MD  latanoprost (XALATAN) 0.005 % ophthalmic solution Apply 0.005 drops to eye at bedtime.   Yes Historical Provider, MD  meloxicam (MOBIC) 15 MG tablet Take 1 tablet (15 mg total) by mouth daily. 12/05/15  Yes Roselee Nova, MD  montelukast (SINGULAIR) 10 MG tablet Take one daily in the evening to prevent cough or wheeze. 12/17/15  Yes Charlies Silvers, MD  MULTIPLE VITAMIN PO Take by mouth daily.   Yes Historical Provider, MD  mupirocin ointment (BACTROBAN) 2 % Apply 1 application topically 2 (two) times daily. 10/01/15  Yes Ashok Norris, MD    nystatin-triamcinolone ointment (MYCOLOG) Apply 1 application topically 2 (two) times daily. 12/06/15  Yes Rubie Maid, MD  OMEGA-3 FATTY ACIDS PO Take by mouth 2 (two) times daily. Reported on 11/08/2015   Yes Historical Provider, MD  omeprazole (PRILOSEC) 20 MG capsule Take 1 capsule (20 mg total) by mouth 2 (two) times daily before a meal. 12/05/15  Yes Roselee Nova, MD  PATADAY 0.2 % SOLN Apply one drop in each eye once daily as needed for itchy eyes. 12/17/15  Yes Charlies Silvers, MD  PREMARIN vaginal cream Reported on 12/17/2015 01/01/15  Yes Historical Provider, MD  triamcinolone ointment (KENALOG) 0.1 % Apply 1 application topically daily. 05/31/15  Yes Roselee Nova, MD  Turmeric 500 MG CAPS Take by mouth.   Yes Historical Provider, MD  Vitamin D, Ergocalciferol, (DRISDOL) 50000 UNITS CAPS capsule Reported on 11/19/2015 02/25/15  Yes Historical Provider, MD  doxycycline (VIBRA-TABS) 100 MG tablet Take 1 tablet (100 mg total) by mouth daily. Patient not taking: Reported on 12/17/2015 05/31/15   Roselee Nova, MD  DULoxetine (CYMBALTA) 30 MG capsule Take 30 mg by mouth daily. Reported on 12/26/2015 09/22/14   Roselee Nova, MD  DULoxetine (CYMBALTA) 60 MG capsule Take 60 mg by mouth daily. Reported on 12/26/2015    Historical Provider, MD  hydrOXYzine (ATARAX/VISTARIL) 25 MG tablet Take 1 tablet (25 mg total) by mouth 3 (three) times daily as needed. Patient not taking: Reported on 12/17/2015 05/31/15   Roselee Nova, MD  moxifloxacin (AVELOX) 400 MG tablet Take 1 tablet (400 mg total) by mouth daily. Patient not taking: Reported on 12/17/2015 10/10/15   Roselee Nova, MD  predniSONE (DELTASONE) 20 MG tablet Take 1 tablet (20 mg total) by mouth 2 (two) times daily with a meal. Patient not taking: Reported on 12/17/2015 10/01/15   Ashok Norris, MD  solifenacin (VESICARE) 5 MG tablet Take 5 mg by mouth daily. Reported on 12/26/2015    Historical Provider, MD    Family History  Problem  Relation Age of Onset  . Heart disease Mother   . Diabetes Mother   . Kidney disease Mother   . Cancer Father     lung cancer  . COPD Father   . Liver cancer Father   . Cancer Sister     Brain Cancer  . Allergic rhinitis Sister   . Asthma Sister   . Hematuria Daughter   . Bladder Cancer Neg Hx      Social History  Substance Use Topics  . Smoking status: Never Smoker   . Smokeless tobacco: Never Used  . Alcohol Use: No  Allergies as of 12/26/2015 - Review Complete 12/26/2015  Allergen Reaction Noted  . Morphine Hives 12/11/2014    Review of Systems:    All systems reviewed and negative except where noted in HPI.   Physical Exam:  BP 125/58 mmHg  Pulse 85  Temp(Src) 98.3 F (36.8 C) (Oral)  Ht 5\' 3"  (1.6 m)  Wt 287 lb (130.182 kg)  BMI 50.85 kg/m2 No LMP recorded. Patient has had a hysterectomy. Psych:  Alert and cooperative. Normal mood and affect. General:   Alert,  Well-developed, Obese, well-nourished, pleasant and cooperative in NAD Head:  Normocephalic and atraumatic. Eyes:  Sclera clear, no icterus.   Conjunctiva pink. Ears:  Normal auditory acuity. Nose:  No deformity, discharge, or lesions. Mouth:  No deformity or lesions,oropharynx pink & moist. Neck:  Supple; no masses or thyromegaly. Lungs:  Respirations even and unlabored.  Clear throughout to auscultation.   No wheezes, crackles, or rhonchi. No acute distress. Heart:  Regular rate and rhythm; no murmurs, clicks, rubs, or gallops. Abdomen:  Normal bowel sounds.  No bruits.  Soft, non-tender and non-distended without masses, hepatosplenomegaly or hernias noted.  No guarding or rebound tenderness.  Negative Carnett sign.   Rectal:  Deferred.  Msk:  Symmetrical without gross deformities.  Good, equal movement & strength bilaterally. Pulses:  Normal pulses noted. Extremities:  No clubbing or edema.  No cyanosis. Neurologic:  Alert and oriented x3;  grossly normal neurologically. Skin:  Intact without  significant lesions or rashes.  No jaundice. Lymph Nodes:  No significant cervical adenopathy. Psych:  Alert and cooperative. Normal mood and affect.  Imaging Studies: No results found.  Assessment and Plan:   Faith Padilla is a 65 y.o. y/o female who comes in today with a month long history of dysphagia to solids more than liquids. The patient also has abdominal pain after she eats. The patient has a history of colon polyps and is due for repeat colonoscopy. The patient will be set up for an EGD and colonoscopy.I have discussed risks & benefits which include, but are not limited to, bleeding, infection, perforation & drug reaction.  The patient agrees with this plan & written consent will be obtained.      Note: This dictation was prepared with Dragon dictation along with smaller phrase technology. Any transcriptional errors that result from this process are unintentional.

## 2015-12-27 ENCOUNTER — Ambulatory Visit (INDEPENDENT_AMBULATORY_CARE_PROVIDER_SITE_OTHER): Payer: Medicare Other | Admitting: Cardiology

## 2015-12-27 ENCOUNTER — Encounter: Payer: Self-pay | Admitting: Cardiology

## 2015-12-27 VITALS — BP 90/64 | HR 97 | Ht 63.0 in | Wt 285.0 lb

## 2015-12-27 DIAGNOSIS — I444 Left anterior fascicular block: Secondary | ICD-10-CM

## 2015-12-27 DIAGNOSIS — R079 Chest pain, unspecified: Secondary | ICD-10-CM | POA: Diagnosis not present

## 2015-12-27 DIAGNOSIS — R0602 Shortness of breath: Secondary | ICD-10-CM

## 2015-12-27 DIAGNOSIS — E785 Hyperlipidemia, unspecified: Secondary | ICD-10-CM | POA: Diagnosis not present

## 2015-12-27 NOTE — Patient Instructions (Addendum)
Medication Instructions:  Your physician recommends that you continue on your current medications as directed. Please refer to the Current Medication list given to you today.   Labwork: None ordered  Testing/Procedures: Your physician has requested that you have an echocardiogram. Echocardiography is a painless test that uses sound waves to create images of your heart. It provides your doctor with information about the size and shape of your heart and how well your heart's chambers and valves are working. This procedure takes approximately one hour. There are no restrictions for this procedure.  Date & Time: _______________________________________________________  [facility]  Your caregiver has ordered a Stress Test with nuclear imaging. The purpose of this test is to evaluate the blood supply to your heart muscle. This procedure is referred to as a "Non-Invasive Stress Test." This is because other than having an IV started in your vein, nothing is inserted or "invades" your body. Cardiac stress tests are done to find areas of poor blood flow to the heart by determining the extent of coronary artery disease (CAD). Some patients exercise on a treadmill, which naturally increases the blood flow to your heart, while others who are  unable to walk on a treadmill due to physical limitations have a pharmacologic/chemical stress agent called Lexiscan . This medicine will mimic walking on a treadmill by temporarily increasing your coronary blood flow.   Please note: these test may take anywhere between 2-4 hours to complete  PLEASE REPORT TO [place] AT THE FIRST DESK WILL DIRECT YOU WHERE TO GO  Date of Procedure:Tuesday Jan 08, 2016 at 07:30AM and Wednesday Jan 09, 2016 at 07:30AM  Arrival Time for Procedure:_Arrive both days at 07:15AM________  Instructions regarding medication:   __X__ : Hold diabetes medication morning of procedure    PLEASE NOTIFY THE  OFFICE AT LEAST 24 HOURS IN ADVANCE IF YOU ARE UNABLE TO KEEP YOUR APPOINTMENT.  [phone] AND  PLEASE NOTIFY NUCLEAR MEDICINE AT [facility] AT LEAST 24 HOURS IN ADVANCE IF YOU ARE UNABLE TO KEEP YOUR APPOINTMENT. [phone]  How to prepare for your Myoview test:   Do not eat or drink after midnight  No caffeine for 24 hours prior to test  No smoking 24 hours prior to test.  Your medication may be taken with water.  If your doctor stopped a medication because of this test, do not take that medication.  Ladies, please do not wear dresses.  Skirts or pants are appropriate. Please wear a short sleeve shirt.  No perfume, cologne or lotion.  Wear comfortable walking shoes. No heels!      Follow-Up: Your physician recommends that you schedule a follow-up appointment after testing to review results  Date & Time:_________________________________________________________   Any Other Special Instructions Will Be Listed Below (If Applicable).     If you need a refill on your cardiac medications before your next appointment, please call your pharmacy.  Echocardiogram An echocardiogram, or echocardiography, uses sound waves (ultrasound) to produce an image of your heart. The echocardiogram is simple, painless, obtained within a short period of time, and offers valuable information to your health care provider. The images from an echocardiogram can provide information such as:  Evidence of coronary artery disease (CAD).  Heart size.  Heart muscle function.  Heart valve function.  Aneurysm detection.  Evidence of a past heart attack.  Fluid buildup around the heart.  Heart muscle thickening.  Assess heart valve function. LET [facility] CARE PROVIDER KNOW ABOUT:  Any allergies you have.  All medicines you are taking, including vitamins, herbs, eye drops, creams, and over-the-counter medicines.  Previous problems you or members of your family have had with the use of  anesthetics.  Any blood disorders you have.  Previous surgeries you have had.  Medical conditions you have.  Possibility of pregnancy, if this applies. BEFORE THE PROCEDURE  No special preparation is needed. Eat and drink normally.  PROCEDURE   In order to produce an image of your heart, gel will be applied to your chest and a wand-like tool (transducer) will be moved over your chest. The gel will help transmit the sound waves from the transducer. The sound waves will harmlessly bounce off your heart to allow the heart images to be captured in real-time motion. These images will then be recorded.  You may need an IV to receive a medicine that improves the quality of the pictures. AFTER THE PROCEDURE You may return to your normal schedule including diet, activities, and medicines, unless your health care provider tells you otherwise.   This information is not intended to replace advice given to you by your health care provider. Make sure you discuss any questions you have with your health care provider.   Document Released: 08/22/2000 Document Revised: 09/15/2014 Document Reviewed: 05/02/2013 Elsevier Interactive Patient Education 2016 [place].  Cardiac Nuclear Scanning A cardiac nuclear scan is used to check your heart for problems, such as the following:  A portion of the heart is not getting enough blood.  Part of the heart muscle has died, which happens with a heart attack.  The heart wall is not working normally.  In this test, a radioactive dye (tracer) is injected into your bloodstream. After the tracer has traveled to your heart, a scanning device is used to measure how much of the tracer is absorbed by or distributed to various areas of your heart. LET [facility] CARE PROVIDER KNOW ABOUT:  Any allergies you have.  All medicines you are taking, including vitamins, herbs, eye drops, creams, and over-the-counter medicines.  Previous problems you or members of your  family have had with the use of anesthetics.  Any blood disorders you have.  Previous surgeries you have had.  Medical conditions you have.  RISKS AND COMPLICATIONS Generally, this is a safe procedure. However, as with any procedure, problems can occur. Possible problems include:   Serious chest pain.  Rapid heartbeat.  Sensation of warmth in your chest. This usually passes quickly. BEFORE THE PROCEDURE Ask your health care provider about changing or stopping your regular medicines. PROCEDURE This procedure is usually done at a hospital and takes 2-4 hours.  An IV tube is inserted into one of your veins.  Your health care provider will inject a small amount of radioactive tracer through the tube.  You will then wait for 20-40 minutes while the tracer travels through your bloodstream.  You will lie down on an exam table so images of your heart can be taken. Images will be taken for about 15-20 minutes.  You will exercise on a treadmill or stationary bike. While you exercise, your heart activity will be monitored with an electrocardiogram (ECG), and your blood pressure will be checked.  If you are unable to exercise, you may be given a medicine to make your heart beat faster.  When blood flow to your heart has peaked, tracer will again be injected through the IV tube.  After 20-40 minutes, you will get back on the  exam table and have more images taken of your heart.  When the procedure is over, your IV tube will be removed. AFTER THE PROCEDURE  You will likely be able to leave shortly after the test. Unless your health care provider tells you otherwise, you may return to your normal schedule, including diet, activities, and medicines.  Make sure you find out how and when you will get your test results.   This information is not intended to replace advice given to you by your health care provider. Make sure you discuss any questions you have with your health care provider.    Document Released: 09/19/2004 Document Revised: 08/30/2013 Document Reviewed: 08/03/2013 Elsevier Interactive Patient Education [employer].

## 2015-12-27 NOTE — Progress Notes (Signed)
Cardiology Office Note   Date:  12/27/2015   ID:  Faith Padilla, DOB 05-Apr-1951, MRN [id number]  Referring Doctor:  Keith Rake, MD   Cardiologist:   Wende Bushy, MD   Reason for consultation:  Chief Complaint  Patient presents with  . other    C/o chest pain and sob. Meds reviewed verbally with pt.      History of Present Illness: Faith Padilla is a 65 y.o. female who presents for chest pains and shortness of breath. Symptoms have been going on for about 6 months. Chest pain is described as you prick sensation. Noted mainly in the center of the chest, nonradiating. 3 out of 10 in severity. Lasts seconds at a time. Randomly occurring. Not typically exertional in nature. Associated with some shortness of breath.  Shortness of breath have been going on for quite some time now. She attributes this to asthma. She can do very little activity/walking due to shortness of breath with exertion.  Patient denies loss of consciousness, headache, fever, cough, colds, don pain, PND, orthopnea, edema.   ROS:  Please see the history of present illness. Aside from mentioned under HPI, all other systems are reviewed and negative.     Past Medical History  Diagnosis Date  . Glaucoma   . HLD (hyperlipidemia)   . Diverticulitis   . Sleep apnea   . Vaginal yeast infection   . Osteoarthritis     lumbar spine  . Acid reflux   . Microscopic hematuria   . Mixed incontinence   . Morbid obesity ([place])   . Anxiety and depression   . Asthma     Past Surgical History  Procedure Laterality Date  . Abdominal hysterectomy  1998  . Back surgery  1990/2014  . Cataract extraction, bilateral  2014  . Lithotripsy  2012  . Deviated nose septum surgery  2000     reports that she has never smoked. She has never used smokeless tobacco. She reports that she does not drink alcohol or use illicit drugs.   family history includes Allergic rhinitis in her sister; Asthma in her sister; COPD in her  father; Cancer in her father and sister; Diabetes in her mother; Heart disease in her mother; Hematuria in her daughter; Kidney disease in her mother; Liver cancer in her father. There is no history of Bladder Cancer.   Current Outpatient Prescriptions  Medication Sig Dispense Refill  . albuterol (PROVENTIL HFA;VENTOLIN HFA) 108 (90 BASE) MCG/ACT inhaler Use 2 puffs every 4 hours as needed to prevent cough or wheeze.  May use 2 puffs 10-20 minutes prior to exercise.  Use with spacer. 2 Inhaler 1  . atorvastatin (LIPITOR) 20 MG tablet Take 1 tablet (20 mg total) by mouth at bedtime. 90 tablet 0  . azelastine (OPTIVAR) 0.05 % ophthalmic solution Place 1 drop into both eyes 2 (two) times daily. 6 mL 5  . Bioflavonoid Products (VITAMIN C) CHEW Chew by mouth daily. Reported on 12/17/2015    . budesonide-formoterol (SYMBICORT) 160-4.5 MCG/ACT inhaler Use 2 puffs twice daily to prevent cough or wheeze.  Rinse, gargle, and spit after use. 3 Inhaler 1  . cetirizine (ZYRTEC) 10 MG tablet TAKE 1 TABLET BY MOUTH AT BEDTIME 30 tablet 4  . clotrimazole (MYCELEX) 10 MG troche Take 1 tablet (10 mg total) by mouth 5 (five) times daily. Slowly dissolve in the mouth.  Do not chew or swallow whole. 50 tablet 0  . co-enzyme Q-10 30 MG capsule Take  30 mg by mouth 3 (three) times daily.    Marland Kitchen doxycycline (VIBRA-TABS) 100 MG tablet Take 1 tablet (100 mg total) by mouth daily. 7 tablet 0  . DULoxetine (CYMBALTA) 60 MG capsule Take 60 mg by mouth daily. Reported on 12/26/2015    . fluticasone (FLONASE) 50 MCG/ACT nasal spray INSTILL 1 TO 2 SPRAYS INTO EACH NOSTRIL 1 TO 2 TIMES DAILY AS NEEDED 16 g 0  . hydrOXYzine (ATARAX/VISTARIL) 25 MG tablet Take 1 tablet (25 mg total) by mouth 3 (three) times daily as needed. 30 tablet 0  . latanoprost (XALATAN) 0.005 % ophthalmic solution Apply 0.005 drops to eye at bedtime.    . meloxicam (MOBIC) 15 MG tablet Take 1 tablet (15 mg total) by mouth daily. 90 tablet 0  . montelukast  (SINGULAIR) 10 MG tablet Take one daily in the evening to prevent cough or wheeze. 90 tablet 1  . MULTIPLE VITAMIN PO Take by mouth daily.    Marland Kitchen nystatin-triamcinolone ointment (MYCOLOG) Apply 1 application topically 2 (two) times daily. 30 g 2  . omeprazole (PRILOSEC) 20 MG capsule Take 1 capsule (20 mg total) by mouth 2 (two) times daily before a meal. 180 capsule 0  . PATADAY 0.2 % SOLN Apply one drop in each eye once daily as needed for itchy eyes. 3 Bottle 1  . predniSONE (DELTASONE) 20 MG tablet Take 1 tablet (20 mg total) by mouth 2 (two) times daily with a meal. 10 tablet 0  . PREMARIN vaginal cream Reported on 12/17/2015  3  . Turmeric 500 MG CAPS Take by mouth.    . Vitamin D, Ergocalciferol, (DRISDOL) 50000 UNITS CAPS capsule Takes 1 tablet by mouth weekly.  4   Current Facility-Administered Medications  Medication Dose Route Frequency Provider Last Rate Last Dose  . predniSONE (DELTASONE) tablet 10 mg  10 mg Oral UD Adelina Mings, MD        Allergies: Morphine    PHYSICAL EXAM: VS:  BP 90/64 mmHg  Pulse 97  Ht 5\' 3"  (1.6 m)  Wt 285 lb (129.275 kg)  BMI 50.50 kg/m2 , Body mass index is 50.5 kg/(m^2). Wt Readings from Last 3 Encounters:  12/27/15 285 lb (129.275 kg)  12/26/15 287 lb (130.182 kg)  12/06/15 282 lb (127.914 kg)    GENERAL:  well developed, well nourished, morbidly obese, not in acute distress HEENT: normocephalic, pink conjunctivae, anicteric sclerae, no xanthelasma, normal dentition, oropharynx clear NECK:  no neck vein engorgement, JVP normal, no hepatojugular reflux, carotid upstroke brisk and symmetric, no bruit, no thyromegaly, no lymphadenopathy LUNGS:  good respiratory effort, clear to auscultation bilaterally CV:  PMI not displaced, no thrills, no lifts, S1 and S2 within normal limits, no palpable S3 or S4, no murmurs, no rubs, no gallops ABD:  Soft, nontender, nondistended, normoactive bowel sounds, no abdominal aortic bruit, no hepatomegaly, no  splenomegaly MS: nontender back, no kyphosis, no scoliosis, no joint deformities EXT:  2+ DP/PT pulses, no edema, no varicosities, no cyanosis, no clubbing SKIN: warm, nondiaphoretic, normal turgor, no ulcers NEUROPSYCH: alert, oriented to person, place, and time, sensory/motor grossly intact, normal mood, appropriate affect  Recent Labs: 08/15/2015: ALT 20; BUN 16; Creatinine, Ser 0.99; Potassium 4.8; Sodium 142   Lipid Panel    Component Value Date/Time   CHOL 152 11/20/2015 0820   TRIG 133 11/20/2015 0820   HDL 38* 11/20/2015 0820   CHOLHDL 4.0 11/20/2015 0820   LDLCALC 87 11/20/2015 0820     Other studies  Reviewed:  EKG:  The ekg from 12/27/2015 was personally reviewed by me and it revealed sinus rhythm, 97 BPM. LAFB.  Additional studies/ records that were reviewed personally reviewed by me today include:none available  ASSESSMENT AND PLAN:  Chest pain shortness of breath Abnormal EKG, LAFB Risk factors for coronary disease include age, postmenopausal state, hyperlipidemia, morbid obesity. Recommend echocardiogram and pharmacologic stress test.  Hyperlipidemia Agree with statin therapy. PCP following labs.  Obesity Body mass index is 50.5 kg/(m^2).Marland Kitchen Recommend aggressive weight loss through diet and increased physical activity, Once cardiac workup is done.    Current medicines are reviewed at length with the patient today.  The patient does not have concerns regarding medicines.  Labs/ tests ordered today include:  Orders Placed This Encounter  Procedures  . NM Myocar Multi W/Spect W/Wall Motion / EF  . EKG 12-Lead  . Echocardiogram    I had a lengthy and detailed discussion with the patient regarding diagnoses, prognosis, diagnostic options, treatment options.  I counseled the patient on importance of lifestyle modification including heart healthy diet, regular physical activity once cardiac workup is done.   Disposition:   FU with undersigned after tests      Signed, Wende Bushy, MD  12/27/2015 3:29 PM    [place]

## 2015-12-28 ENCOUNTER — Other Ambulatory Visit: Payer: Self-pay

## 2016-01-01 DIAGNOSIS — F411 Generalized anxiety disorder: Secondary | ICD-10-CM | POA: Diagnosis not present

## 2016-01-01 DIAGNOSIS — F3341 Major depressive disorder, recurrent, in partial remission: Secondary | ICD-10-CM | POA: Diagnosis not present

## 2016-01-07 ENCOUNTER — Telehealth: Payer: Self-pay | Admitting: Cardiology

## 2016-01-07 ENCOUNTER — Ambulatory Visit: Payer: Medicare Other | Admitting: Cardiovascular Disease

## 2016-01-07 NOTE — Telephone Encounter (Signed)
Spoke with patient and reviewed instructions for stress testing tomorrow 01/08/16 and 01/09/16. She verbalized understanding of date, time, instructions, and had no further questions at this time.

## 2016-01-08 ENCOUNTER — Encounter
Admission: RE | Admit: 2016-01-08 | Discharge: 2016-01-08 | Disposition: A | Discharge status: Home / Self Care | Payer: Medicare Other | Source: Ambulatory Visit | Attending: Cardiology | Admitting: Cardiology

## 2016-01-08 ENCOUNTER — Ambulatory Visit
Admission: RE | Admit: 2016-01-08 | Discharge: 2016-01-08 | Disposition: A | Discharge status: Home / Self Care | Payer: Medicare Other | Source: Ambulatory Visit | Attending: Cardiology | Admitting: Cardiology

## 2016-01-08 ENCOUNTER — Telehealth: Payer: Self-pay | Admitting: Cardiology

## 2016-01-08 DIAGNOSIS — R0602 Shortness of breath: Secondary | ICD-10-CM

## 2016-01-08 DIAGNOSIS — R079 Chest pain, unspecified: Secondary | ICD-10-CM | POA: Diagnosis not present

## 2016-01-08 DIAGNOSIS — E785 Hyperlipidemia, unspecified: Secondary | ICD-10-CM | POA: Diagnosis not present

## 2016-01-08 DIAGNOSIS — F411 Generalized anxiety disorder: Secondary | ICD-10-CM | POA: Diagnosis not present

## 2016-01-08 DIAGNOSIS — I444 Left anterior fascicular block: Secondary | ICD-10-CM | POA: Diagnosis not present

## 2016-01-08 LAB — NM MYOCAR MULTI W/SPECT W/WALL MOTION / EF
CHL CUP STRESS STAGE 1 SPEED: 0 mph
CHL CUP STRESS STAGE 2 GRADE: 0 %
CHL CUP STRESS STAGE 2 HR: 79 {beats}/min
CHL CUP STRESS STAGE 4 SPEED: 0 mph
CHL CUP STRESS STAGE 5 GRADE: 0 %
CHL CUP STRESS STAGE 5 HR: 108 {beats}/min
CHL CUP STRESS STAGE 6 SBP: 119 mmHg
CSEPPHR: 106 {beats}/min
CSEPPMHR: 68 %
Estimated workload: 1 METS
LV dias vol: 78 mL (ref 46–106)
LVSYSVOL: 39 mL
Percent HR: 72 %
Rest HR: 69 {beats}/min
SDS: 0
SRS: 8
SSS: 0
Stage 1 Grade: 0 %
Stage 1 HR: 71 {beats}/min
Stage 2 Speed: 0 mph
Stage 3 Grade: 0 %
Stage 3 HR: 80 {beats}/min
Stage 3 Speed: 0 mph
Stage 4 Grade: 0 %
Stage 4 HR: 106 {beats}/min
Stage 5 Speed: 0 mph
Stage 6 DBP: 59 mmHg
Stage 6 Grade: 0 %
Stage 6 HR: 93 {beats}/min
Stage 6 Speed: 0 mph
TID: 1.13

## 2016-01-08 MED ORDER — REGADENOSON 0.4 MG/5ML IV SOLN
0.4000 mg | Freq: Once | INTRAVENOUS | Status: AC
  Administered 2016-01-08: 0.4 mg via INTRAVENOUS

## 2016-01-08 MED ORDER — TECHNETIUM TC 99M SESTAMIBI - CARDIOLITE
30.2840 | Freq: Once | INTRAVENOUS | Status: AC | PRN
  Administered 2016-01-08: 30.284 via INTRAVENOUS

## 2016-01-08 MED ORDER — TECHNETIUM TC 99M SESTAMIBI - CARDIOLITE
12.8700 | Freq: Once | INTRAVENOUS | Status: AC | PRN
  Administered 2016-01-08: 12.87 via INTRAVENOUS

## 2016-01-08 NOTE — Telephone Encounter (Signed)
Patient states that she just has not felt well since her stress test. She is just sick to her stomach and she said they had told her after her test to drink some coffee and that would help her feel better. Patient stated that she just couldn't drink anymore. She denied any chest pain, shortness of breath, or any other symptoms other than just feeling sick on her stomach and tired. Let her know that is a normal side effect from this type of test and that with some rest she should hopefully feel better tomorrow. Instructed her to call back if these symptoms continue and she verbalized agreement with plan and had no further questions.

## 2016-01-08 NOTE — Telephone Encounter (Signed)
Pt states since this morning after her stress test, she has felt nauseated, and dizzy. Please call

## 2016-01-09 DIAGNOSIS — H40013 Open angle with borderline findings, low risk, bilateral: Secondary | ICD-10-CM | POA: Diagnosis not present

## 2016-01-10 ENCOUNTER — Encounter: Payer: Self-pay | Admitting: Cardiology

## 2016-01-10 ENCOUNTER — Ambulatory Visit (INDEPENDENT_AMBULATORY_CARE_PROVIDER_SITE_OTHER): Payer: Medicare Other

## 2016-01-10 ENCOUNTER — Other Ambulatory Visit: Payer: Self-pay

## 2016-01-10 DIAGNOSIS — R079 Chest pain, unspecified: Secondary | ICD-10-CM

## 2016-01-10 DIAGNOSIS — E785 Hyperlipidemia, unspecified: Secondary | ICD-10-CM | POA: Diagnosis not present

## 2016-01-10 DIAGNOSIS — I444 Left anterior fascicular block: Secondary | ICD-10-CM | POA: Diagnosis not present

## 2016-01-10 DIAGNOSIS — R0602 Shortness of breath: Secondary | ICD-10-CM

## 2016-01-10 NOTE — Progress Notes (Signed)
Patient was in today having echocardiogram done. Since having her stress test she has not felt well and is very weak, tired, and having some nausea. Dr. Yvone Neu reviewed echo from today and then went out to lobby to speak with patient about her symptoms and instructed her to follow up with her primary care physician. Patient has upcoming appointment next week for [place] to review her test results. Instructed patient to continue monitoring her blood pressures and we will see her next week. She verbalized understanding of Dr. Tora Kindred instructions along with our discussion about appointment and monitoring her blood pressures. She had no further questions at this time.

## 2016-01-15 DIAGNOSIS — F411 Generalized anxiety disorder: Secondary | ICD-10-CM | POA: Diagnosis not present

## 2016-01-15 DIAGNOSIS — F3341 Major depressive disorder, recurrent, in partial remission: Secondary | ICD-10-CM | POA: Diagnosis not present

## 2016-01-17 ENCOUNTER — Ambulatory Visit: Payer: Medicare Other | Admitting: Cardiology

## 2016-01-21 ENCOUNTER — Telehealth: Payer: Self-pay | Admitting: Gastroenterology

## 2016-01-21 NOTE — Telephone Encounter (Signed)
Patient called and stated she is having trouble with her diverticulitis when she gets up and tries to walk around. Please call

## 2016-01-22 ENCOUNTER — Emergency Department
Admission: EM | Admit: 2016-01-22 | Discharge: 2016-01-22 | Disposition: A | Discharge status: Home / Self Care | Payer: Medicare Other | Attending: Emergency Medicine | Admitting: Emergency Medicine

## 2016-01-22 ENCOUNTER — Emergency Department: Payer: Medicare Other

## 2016-01-22 DIAGNOSIS — F329 Major depressive disorder, single episode, unspecified: Secondary | ICD-10-CM | POA: Insufficient documentation

## 2016-01-22 DIAGNOSIS — E785 Hyperlipidemia, unspecified: Secondary | ICD-10-CM | POA: Insufficient documentation

## 2016-01-22 DIAGNOSIS — Z7952 Long term (current) use of systemic steroids: Secondary | ICD-10-CM | POA: Insufficient documentation

## 2016-01-22 DIAGNOSIS — J4540 Moderate persistent asthma, uncomplicated: Secondary | ICD-10-CM | POA: Diagnosis not present

## 2016-01-22 DIAGNOSIS — Z79899 Other long term (current) drug therapy: Secondary | ICD-10-CM | POA: Insufficient documentation

## 2016-01-22 DIAGNOSIS — R1032 Left lower quadrant pain: Secondary | ICD-10-CM | POA: Diagnosis present

## 2016-01-22 DIAGNOSIS — K5792 Diverticulitis of intestine, part unspecified, without perforation or abscess without bleeding: Secondary | ICD-10-CM | POA: Diagnosis not present

## 2016-01-22 DIAGNOSIS — K5732 Diverticulitis of large intestine without perforation or abscess without bleeding: Secondary | ICD-10-CM | POA: Diagnosis not present

## 2016-01-22 LAB — COMPREHENSIVE METABOLIC PANEL WITH GFR
ALT: 23 U/L (ref 14–54)
AST: 23 U/L (ref 15–41)
Albumin: 3.6 g/dL (ref 3.5–5.0)
Alkaline Phosphatase: 108 U/L (ref 38–126)
Anion gap: 8 (ref 5–15)
BUN: 17 mg/dL (ref 6–20)
CO2: 26 mmol/L (ref 22–32)
Calcium: 9.1 mg/dL (ref 8.9–10.3)
Chloride: 101 mmol/L (ref 101–111)
Creatinine, Ser: 1 mg/dL (ref 0.44–1.00)
GFR calc Af Amer: >60 mL/min
GFR calc non Af Amer: 58 mL/min — ABNORMAL LOW
Glucose, Bld: 132 mg/dL — ABNORMAL HIGH (ref 65–99)
Potassium: 4 mmol/L (ref 3.5–5.1)
Sodium: 135 mmol/L (ref 135–145)
Total Bilirubin: 0.7 mg/dL (ref 0.3–1.2)
Total Protein: 7.5 g/dL (ref 6.5–8.1)

## 2016-01-22 LAB — LIPASE, BLOOD: LIPASE: 20 U/L (ref 11–51)

## 2016-01-22 LAB — CBC
HCT: 39.4 % (ref 35.0–47.0)
Hemoglobin: 12.9 g/dL (ref 12.0–16.0)
MCH: 27.6 pg (ref 26.0–34.0)
MCHC: 32.6 g/dL (ref 32.0–36.0)
MCV: 84.7 fL (ref 80.0–100.0)
PLATELETS: 432 10*3/uL (ref 150–440)
RBC: 4.65 MIL/uL (ref 3.80–5.20)
RDW: 16 % — ABNORMAL HIGH (ref 11.5–14.5)
WBC: 12.5 10*3/uL — AB (ref 3.6–11.0)

## 2016-01-22 MED ORDER — ONDANSETRON HCL 4 MG/2ML IJ SOLN
INTRAMUSCULAR | Status: AC
  Administered 2016-01-22: 4 mg via INTRAVENOUS
  Filled 2016-01-22: qty 2

## 2016-01-22 MED ORDER — IOPAMIDOL (ISOVUE-370) INJECTION 76%
100.0000 mL | Freq: Once | INTRAVENOUS | Status: AC | PRN
  Administered 2016-01-22: 100 mL via INTRAVENOUS
  Filled 2016-01-22: qty 100

## 2016-01-22 MED ORDER — FENTANYL CITRATE (PF) 100 MCG/2ML IJ SOLN
12.5000 ug | Freq: Once | INTRAMUSCULAR | Status: AC
  Administered 2016-01-22: 12.5 ug via INTRAVENOUS

## 2016-01-22 MED ORDER — DIATRIZOATE MEGLUMINE & SODIUM 66-10 % PO SOLN
15.0000 mL | Freq: Once | ORAL | Status: AC
  Administered 2016-01-22: 15 mL via ORAL

## 2016-01-22 MED ORDER — FENTANYL CITRATE (PF) 100 MCG/2ML IJ SOLN
INTRAMUSCULAR | Status: AC
  Administered 2016-01-22: 12.5 ug via INTRAVENOUS
  Filled 2016-01-22: qty 2

## 2016-01-22 MED ORDER — HYDROCODONE-ACETAMINOPHEN 5-325 MG PO TABS: 1.0000 | ORAL_TABLET | ORAL | Status: DC | PRN

## 2016-01-22 MED ORDER — METRONIDAZOLE 500 MG PO TABS: 500.0000 mg | ORAL_TABLET | Freq: Three times a day (TID) | ORAL | Status: DC

## 2016-01-22 MED ORDER — ONDANSETRON HCL 4 MG/2ML IJ SOLN
4.0000 mg | INTRAMUSCULAR | Status: AC
  Administered 2016-01-22: 4 mg via INTRAVENOUS

## 2016-01-22 MED ORDER — SODIUM CHLORIDE 0.9 % IV BOLUS (SEPSIS)
500.0000 mL | INTRAVENOUS | Status: AC
  Administered 2016-01-22: 500 mL via INTRAVENOUS

## 2016-01-22 MED ORDER — CIPROFLOXACIN HCL 500 MG PO TABS
500.0000 mg | ORAL_TABLET | ORAL | Status: AC
  Administered 2016-01-22: 500 mg via ORAL

## 2016-01-22 MED ORDER — DOCUSATE SODIUM 100 MG PO CAPS: ORAL_CAPSULE | ORAL | Status: DC

## 2016-01-22 MED ORDER — METRONIDAZOLE 500 MG PO TABS
ORAL_TABLET | ORAL | Status: AC
  Administered 2016-01-22: 500 mg via ORAL
  Filled 2016-01-22: qty 1

## 2016-01-22 MED ORDER — CIPROFLOXACIN HCL 500 MG PO TABS
ORAL_TABLET | ORAL | Status: AC
  Administered 2016-01-22: 500 mg via ORAL
  Filled 2016-01-22: qty 1

## 2016-01-22 MED ORDER — CIPROFLOXACIN HCL 500 MG PO TABS: 500.0000 mg | ORAL_TABLET | Freq: Two times a day (BID) | ORAL | Status: AC

## 2016-01-22 MED ORDER — ONDANSETRON 4 MG PO TBDP: ORAL_TABLET | ORAL | Status: DC

## 2016-01-22 MED ORDER — METRONIDAZOLE 500 MG PO TABS
500.0000 mg | ORAL_TABLET | Freq: Once | ORAL | Status: AC
  Administered 2016-01-22: 500 mg via ORAL

## 2016-01-22 NOTE — ED Provider Notes (Signed)
[facility] Emergency Department Provider Note  ____________________________________________  Time seen: Approximately 7:40 PM  I have reviewed the triage vital signs and the nursing notes.   HISTORY  Chief Complaint Abdominal Pain    HPI Faith Padilla is a 65 y.o. female who has had episodes of diverticulitis in the past who presents today for gradual onset left lower quadrant pain that started about 2 days ago.  It has been steadily getting worse over the last 2 days.  She describes as a sharp and stabbing pain that is made worse with any sort of movement.  She has not had much of an appetite during the same time period.  She denies nausea, vomiting, constipation, diarrhea, fever/chills, chest pain, shortness of breath, dysuria.  Nothing is making the pain better.  She states that it feels just like it did when she had diverticulitis in the past.  She has never had any abdominal surgeries.  Her last episode was many years ago.   Past Medical History  Diagnosis Date  . Glaucoma   . HLD (hyperlipidemia)   . Diverticulitis   . Sleep apnea   . Vaginal yeast infection   . Osteoarthritis     lumbar spine  . Acid reflux   . Microscopic hematuria   . Mixed incontinence   . Morbid obesity ([place])   . Anxiety and depression   . Asthma   . Kidney stones     Patient Active Problem List   Diagnosis Date Noted  . Gastroesophageal reflux disease without esophagitis 12/17/2015  . Other allergic rhinitis 12/17/2015  . Seasonal allergic conjunctivitis 12/17/2015  . Cystocele, grade 2 11/21/2015  . Microscopic hematuria 11/21/2015  . Esophageal motility disorder 11/19/2015  . Vertigo 10/15/2015  . Oral candidiasis 10/15/2015  . Sleep apnea 10/11/2015  . Subacute frontal sinusitis 10/10/2015  . Chest pain at rest 10/10/2015  . Hyperlipidemia 08/14/2015  . Hyperglycemia 08/14/2015  . Asthma with acute exacerbation 07/18/2015  . Moderate persistent asthma  07/18/2015  . Rash of face 05/31/2015  . Dermatitis 05/31/2015  . Rosacea 05/31/2015  . Incontinence 04/12/2015  . Atrophic vaginitis 04/12/2015  . Osteoarthritis of right knee 03/29/2015  . Acid reflux 12/11/2014  . Asthma with allergic rhinitis 12/11/2014  . Airway hyperreactivity 12/11/2014  . Clinical depression 12/11/2014  . Diverticulitis 12/11/2014  . Auditory acuity evaluation 12/11/2014  . Glaucoma 12/11/2014  . Gravida 1 12/11/2014  . Hot flash, menopausal 12/11/2014  . HLD (hyperlipidemia) 12/11/2014  . Calculus of kidney 12/11/2014  . Degenerative arthritis of lumbar spine 12/11/2014  . Obstructive apnea 12/11/2014  . Urge incontinence 12/11/2014    Past Surgical History  Procedure Laterality Date  . Abdominal hysterectomy  1998  . Back surgery  1990/2014  . Cataract extraction, bilateral  2014  . Lithotripsy  2012  . Deviated nose septum surgery  2000    Current Outpatient Rx  Name  Route  Sig  Dispense  Refill  . albuterol (PROVENTIL HFA;VENTOLIN HFA) 108 (90 BASE) MCG/ACT inhaler      Use 2 puffs every 4 hours as needed to prevent cough or wheeze.  May use 2 puffs 10-20 minutes prior to exercise.  Use with spacer.   2 Inhaler   1   . atorvastatin (LIPITOR) 20 MG tablet   Oral   Take 1 tablet (20 mg total) by mouth at bedtime.   90 tablet   0   . azelastine (OPTIVAR) 0.05 % ophthalmic solution  Both Eyes   Place 1 drop into both eyes 2 (two) times daily.   6 mL   5   . Bioflavonoid Products (VITAMIN C) CHEW   Oral   Chew by mouth daily. Reported on 12/17/2015         . budesonide-formoterol (SYMBICORT) 160-4.5 MCG/ACT inhaler      Use 2 puffs twice daily to prevent cough or wheeze.  Rinse, gargle, and spit after use.   3 Inhaler   1     Covered under patients plan   . cetirizine (ZYRTEC) 10 MG tablet      TAKE 1 TABLET BY MOUTH AT BEDTIME   30 tablet   4   . ciprofloxacin (CIPRO) 500 MG tablet   Oral   Take 1 tablet (500 mg  total) by mouth 2 (two) times daily.   20 tablet   0   . clotrimazole (MYCELEX) 10 MG troche   Oral   Take 1 tablet (10 mg total) by mouth 5 (five) times daily. Slowly dissolve in the mouth.  Do not chew or swallow whole.   50 tablet   0   . co-enzyme Q-10 30 MG capsule   Oral   Take 30 mg by mouth 3 (three) times daily.         Marland Kitchen docusate sodium (COLACE) 100 MG capsule      Take 1 tablet once or twice daily as needed for constipation while taking narcotic pain medicine   30 capsule   0   . doxycycline (VIBRA-TABS) 100 MG tablet   Oral   Take 1 tablet (100 mg total) by mouth daily.   7 tablet   0   . DULoxetine (CYMBALTA) 60 MG capsule   Oral   Take 60 mg by mouth daily. Reported on 12/26/2015         . fluticasone (FLONASE) 50 MCG/ACT nasal spray      INSTILL 1 TO 2 SPRAYS INTO EACH NOSTRIL 1 TO 2 TIMES DAILY AS NEEDED   16 g   0   . HYDROcodone-acetaminophen (NORCO/VICODIN) 5-325 MG tablet   Oral   Take 1-2 tablets by mouth every 4 (four) hours as needed for moderate pain.   15 tablet   0   . hydrOXYzine (ATARAX/VISTARIL) 25 MG tablet   Oral   Take 1 tablet (25 mg total) by mouth 3 (three) times daily as needed.   30 tablet   0   . latanoprost (XALATAN) 0.005 % ophthalmic solution   Ophthalmic   Apply 0.005 drops to eye at bedtime.         . meloxicam (MOBIC) 15 MG tablet   Oral   Take 1 tablet (15 mg total) by mouth daily.   90 tablet   0   . metroNIDAZOLE (FLAGYL) 500 MG tablet   Oral   Take 1 tablet (500 mg total) by mouth 3 (three) times daily.   30 tablet   0   . montelukast (SINGULAIR) 10 MG tablet      Take one daily in the evening to prevent cough or wheeze.   90 tablet   1   . MULTIPLE VITAMIN PO   Oral   Take by mouth daily.         Marland Kitchen nystatin-triamcinolone ointment (MYCOLOG)   Topical   Apply 1 application topically 2 (two) times daily.   30 g   2   . omeprazole (PRILOSEC) 20 MG capsule   Oral  Take 1 capsule (20  mg total) by mouth 2 (two) times daily before a meal.   180 capsule   0   . ondansetron (ZOFRAN ODT) 4 MG disintegrating tablet      Allow 1-2 tablets to dissolve in your mouth every 8 hours as needed for nausea/vomiting   30 tablet   0   . PATADAY 0.2 % SOLN      Apply one drop in each eye once daily as needed for itchy eyes.   3 Bottle   1     Dispense as written.   . predniSONE (DELTASONE) 20 MG tablet   Oral   Take 1 tablet (20 mg total) by mouth 2 (two) times daily with a meal.   10 tablet   0   . PREMARIN vaginal cream      Reported on 12/17/2015      3     Dispense as written.   . Turmeric 500 MG CAPS   Oral   Take by mouth.         . Vitamin D, Ergocalciferol, (DRISDOL) 50000 UNITS CAPS capsule      Takes 1 tablet by mouth weekly.      4     Allergies Morphine  Family History  Problem Relation Age of Onset  . Heart disease Mother   . Diabetes Mother   . Kidney disease Mother   . Cancer Father     lung cancer  . COPD Father   . Liver cancer Father   . Cancer Sister     Brain Cancer  . Allergic rhinitis Sister   . Asthma Sister   . Hematuria Daughter   . Bladder Cancer Neg Hx     Social History Social History  Substance Use Topics  . Smoking status: Never Smoker   . Smokeless tobacco: Never Used  . Alcohol Use: No    Review of Systems Constitutional: No fever/chills Eyes: No visual changes. ENT: No sore throat. Cardiovascular: Denies chest pain. Respiratory: Denies shortness of breath. Gastrointestinal: LLQ abd pain.  Denies N/V/D/constipation Genitourinary: Negative for dysuria. Musculoskeletal: Negative for back pain. Skin: Negative for rash. Neurological: Negative for headaches, focal weakness or numbness.  10-point ROS otherwise negative.  ____________________________________________   PHYSICAL EXAM:  VITAL SIGNS: ED Triage Vitals  Enc Vitals Group     BP 01/22/16 1815 150/87 mmHg     Pulse Rate 01/22/16 1815  106     Resp 01/22/16 1815 18     Temp 01/22/16 1815 98.8 F (37.1 C)     Temp Source 01/22/16 1815 Oral     SpO2 01/22/16 1815 94 %     Weight 01/22/16 1815 280 lb (127.007 kg)     Height 01/22/16 1815 5\' 3"  (1.6 m)     Head Cir --      Peak Flow --      Pain Score 01/22/16 1816 10     Pain Loc --      Pain Edu? --      Excl. in [place]? --     Constitutional: Alert and oriented. Well appearing and in no acute distress. Eyes: Conjunctivae are normal. PERRL. EOMI. Head: Atraumatic. Nose: No congestion/rhinnorhea. Mouth/Throat: Mucous membranes are moist.  Oropharynx non-erythematous. Neck: No stridor.  No meningeal signs.   Cardiovascular: Normal rate, regular rhythm. Good peripheral circulation. Grossly normal heart sounds.   Respiratory: Normal respiratory effort.  No retractions. Lungs CTAB. Gastrointestinal: Soft, morbid obesity. Severe TTP of  LLQ. Musculoskeletal: No lower extremity tenderness nor edema. No gross deformities of extremities. Neurologic:  Normal speech and language. No gross focal neurologic deficits are appreciated.  Skin:  Skin is warm, dry and intact. No rash noted. Psychiatric: Mood and affect are normal. Speech and behavior are normal.  ____________________________________________   LABS (all labs ordered are listed, but only abnormal results are displayed)  Labs Reviewed  COMPREHENSIVE METABOLIC PANEL - Abnormal; Notable for the following:    Glucose, Bld 132 (*)    GFR calc non Af Amer 58 (*)    All other components within normal limits  CBC - Abnormal; Notable for the following:    WBC 12.5 (*)    RDW 16.0 (*)    All other components within normal limits  LIPASE, BLOOD  URINALYSIS COMPLETEWITH MICROSCOPIC ([facility] ONLY)   ____________________________________________  EKG  None ____________________________________________  RADIOLOGY   Ct Abdomen Pelvis W Contrast  01/22/2016  CLINICAL DATA:  65 year old female with left lower quadrant  abdominal pain. History of prior diverticulitis. EXAM: CT ABDOMEN AND PELVIS WITH CONTRAST TECHNIQUE: Multidetector CT imaging of the abdomen and pelvis was performed using the standard protocol following bolus administration of intravenous contrast. CONTRAST:  100 cc Isovue 370 COMPARISON:  CT dated 07/24/2014 FINDINGS: The visualized lung bases are clear. No intra-abdominal free air or free fluid. Apparent diffuse fatty infiltration of the liver. The gallbladder, pancreas, spleen, adrenal glands, appear unremarkable. There is a 4 mm nonobstructing right renal inferior pole calculus. A 3 mm nonobstructing left renal interpolar calculus may also be present. There is no hydronephrosis on either side. There is stable appearing irregularity of the renal cortices most prominent on the right which may be related to prior infection and infarct. The visualized ureters and urinary bladder appear unremarkable. Hysterectomy. There is extensive sigmoid diverticulosis. There is segmental inflammatory changes of the sigmoid colon compatible with acute diverticulitis. There is no diverticular abscess or evidence of perforation. There is a stable moderate size hiatal hernia. No evidence of bowel obstruction. Normal appendix. The abdominal aorta and IVC appear unremarkable. No portal venous gas identified. There is no adenopathy. There is a small fat containing umbilical hernia. The abdominal wall soft tissues are otherwise unremarkable. There is lower lumbar laminectomy with posterior fixation hardware at L4-S1. L4-L5 disc spacer noted. There is grade 1 L5-S1 anterolisthesis. No acute fracture. IMPRESSION: Sigmoid diverticulitis.  No abscess. Stable moderate size hiatal hernia. Electronically Signed   By: Anner Crete M.D.   On: 01/22/2016 20:52    ____________________________________________   PROCEDURES  Procedure(s) performed: None  Critical Care performed:  No ____________________________________________   INITIAL IMPRESSION / ASSESSMENT AND PLAN / ED COURSE  Pertinent labs & imaging results that were available during my care of the patient were reviewed by me and considered in my medical decision making (see chart for details).  Given the patient's age, tachycardia, leukocytosis, and severe tenderness to palpation, I feel that it is appropriate to obtain a CT scan of her abdomen to make sure she does Not have complicated diverticulitis with perforation or abscess.  ----------------------------------------- 9:08 PM on 01/22/2016 -----------------------------------------  Patient has stable vital signs and is afebrile, CT scan consistent with uncomplicated diverticulitis.  The patient reports no pain at this time.  I had my usual and customary discussion about outpatient management of diverticulitis with her and her daughter.  They know to return if she develops new or worsening symptoms.  The first dose of antibiotics in the emergency department  since it is late in the pharmacies are closed.   ____________________________________________  FINAL CLINICAL IMPRESSION(S) / ED DIAGNOSES  Final diagnoses:  Acute diverticulitis of intestine     MEDICATIONS GIVEN DURING THIS VISIT:  Medications  ciprofloxacin (CIPRO) tablet 500 mg (not administered)  metroNIDAZOLE (FLAGYL) tablet 500 mg (not administered)  ciprofloxacin (CIPRO) 500 MG tablet (not administered)  ondansetron (ZOFRAN) injection 4 mg (4 mg Intravenous Given 01/22/16 2014)  sodium chloride 0.9 % bolus 500 mL (500 mLs Intravenous New Bag/Given 01/22/16 2014)  fentaNYL (SUBLIMAZE) injection 12.5 mcg (12.5 mcg Intravenous Given 01/22/16 2014)  diatrizoate meglumine-sodium (GASTROGRAFIN) 66-10 % solution 15 mL (15 mLs Oral Given 01/22/16 2003)  iopamidol (ISOVUE-370) 76 % injection 100 mL (100 mLs Intravenous Contrast Given 01/22/16 2020)     NEW OUTPATIENT MEDICATIONS STARTED DURING  THIS VISIT:  New Prescriptions   CIPROFLOXACIN (CIPRO) 500 MG TABLET    Take 1 tablet (500 mg total) by mouth 2 (two) times daily.   DOCUSATE SODIUM (COLACE) 100 MG CAPSULE    Take 1 tablet once or twice daily as needed for constipation while taking narcotic pain medicine   HYDROCODONE-ACETAMINOPHEN (NORCO/VICODIN) 5-325 MG TABLET    Take 1-2 tablets by mouth every 4 (four) hours as needed for moderate pain.   METRONIDAZOLE (FLAGYL) 500 MG TABLET    Take 1 tablet (500 mg total) by mouth 3 (three) times daily.   ONDANSETRON (ZOFRAN ODT) 4 MG DISINTEGRATING TABLET    Allow 1-2 tablets to dissolve in your mouth every 8 hours as needed for nausea/vomiting      Note:  This document was prepared using Dragon voice recognition software and may include unintentional dictation errors.   Hinda Kehr, MD 01/22/16 2109

## 2016-01-22 NOTE — Telephone Encounter (Signed)
Patient called back today and stated that the miralax is not working. Her bowels are only moving a little and she is in a lot of pain. Can you please call her today?

## 2016-01-22 NOTE — Discharge Instructions (Signed)
We believe your symptoms are caused by diverticulitis.  Most of the time this condition (please read through the included information) can be cured with outpatient antibiotics.  Please take the full course of prescribed medication(s) and follow up with the doctors recommended above.  Return to the ED if your abdominal pain worsens or fails to improve, you develop bloody vomiting, bloody diarrhea, you are unable to tolerate fluids due to vomiting, fever greater than 101, or other symptoms that concern you.  Take Norco as prescribed for severe pain. Do not drink alcohol, drive or participate in any other potentially dangerous activities while taking this medication as it may make you sleepy. Do not take this medication with any other sedating medications, either prescription or over-the-counter. If you were prescribed Percocet or Vicodin, do not take these with acetaminophen (Tylenol) as it is already contained within these medications.   This medication is an opiate (or narcotic) pain medication and can be habit forming.  Use it as little as possible to achieve adequate pain control.  Do not use or use it with extreme caution if you have a history of opiate abuse or dependence.  If you are on a pain contract with your primary care doctor or a pain specialist, be sure to let them know you were prescribed this medication today from the [facility].  This medication is intended for your use only - do not give any to anyone else and keep it in a secure place where nobody else, especially children, have access to it.  It will also cause or worsen constipation, so you may want to consider taking an over-the-counter stool softener while you are taking this medication.  Please be sure to take a stool softener, MiraLAX, or other similar agent.  You do NOT want to be constipated while trying to get over diverticulitis.   Diverticulitis Diverticulitis is inflammation or infection of small  pouches in your colon that form when you have a condition called diverticulosis. The pouches in your colon are called diverticula. Your colon, or large intestine, is where water is absorbed and stool is formed. Complications of diverticulitis can include:  Bleeding.  Severe infection.  Severe pain.  Perforation of your colon.  Obstruction of your colon. CAUSES  Diverticulitis is caused by bacteria. Diverticulitis happens when stool becomes trapped in diverticula. This allows bacteria to grow in the diverticula, which can lead to inflammation and infection. RISK FACTORS People with diverticulosis are at risk for diverticulitis. Eating a diet that does not include enough fiber from fruits and vegetables may make diverticulitis more likely to develop. SYMPTOMS  Symptoms of diverticulitis may include:  Abdominal pain and tenderness. The pain is normally located on the left side of the abdomen, but may occur in other areas.  Fever and chills.  Bloating.  Cramping.  Nausea.  Vomiting.  Constipation.  Diarrhea.  Blood in your stool. DIAGNOSIS  Your health care provider will ask you about your medical history and do a physical exam. You may need to have tests done because many medical conditions can cause the same symptoms as diverticulitis. Tests may include:  Blood tests.  Urine tests.  Imaging tests of the abdomen, including X-rays and CT scans. When your condition is under control, your health care provider may recommend that you have a colonoscopy. A colonoscopy can show how severe your diverticula are and whether something else is causing your symptoms. TREATMENT  Most cases of diverticulitis are mild and can be  treated at home. Treatment may include:  Taking over-the-counter pain medicines.  Following a clear liquid diet.  Taking antibiotic medicines by mouth for 7-10 days. More severe cases may be treated at a hospital. Treatment may include:  Not eating or  drinking.  Taking prescription pain medicine.  Receiving antibiotic medicines through an IV tube.  Receiving fluids and nutrition through an IV tube.  Surgery. HOME CARE INSTRUCTIONS   Follow your health care provider's instructions carefully.  Follow a full liquid diet or other diet as directed by your health care provider. After your symptoms improve, your health care provider may tell you to change your diet. He or she may recommend you eat a high-fiber diet. Fruits and vegetables are good sources of fiber. Fiber makes it easier to pass stool.  Take fiber supplements or probiotics as directed by your health care provider.  Only take medicines as directed by your health care provider.  Keep all your follow-up appointments. SEEK MEDICAL CARE IF:   Your pain does not improve.  You have a hard time eating food.  Your bowel movements do not return to normal. SEEK IMMEDIATE MEDICAL CARE IF:   Your pain becomes worse.  Your symptoms do not get better.  Your symptoms suddenly get worse.  You have a fever.  You have repeated vomiting.  You have bloody or black, tarry stools. MAKE SURE YOU:   Understand these instructions.  Will watch your condition.  Will get help right away if you are not doing well or get worse. Document Released: 06/04/2005 Document Revised: 08/30/2013 Document Reviewed: 07/20/2013 [facility] Patient Information 2015 [place], [place]. This information is not intended to replace advice given to you by your health care provider. Make sure you discuss any questions you have with your health care provider.

## 2016-01-22 NOTE — ED Notes (Signed)
Pt reports she is having a Diverticulosis flare up that started x2 days ago. Pt unable to reach PCP.

## 2016-01-22 NOTE — Telephone Encounter (Signed)
Spoke with pt and she advised me she was having abdominal pain because she hasn't had a bowel movement today. Advised pt to take Miralax until she can have a bowel movement.

## 2016-01-22 NOTE — Telephone Encounter (Signed)
Please advise 

## 2016-01-22 NOTE — ED Notes (Signed)
Pt c/o LLQ pain for the past 2 days, states she began having loose stools today, states she has a hx of diverticulitis and this feels the same. Denies N/V.Marland Kitchen

## 2016-01-22 NOTE — ED Notes (Signed)
Patient transported to CT 

## 2016-01-23 ENCOUNTER — Ambulatory Visit: Payer: Medicare Other | Admitting: Cardiology

## 2016-01-29 DIAGNOSIS — F3341 Major depressive disorder, recurrent, in partial remission: Secondary | ICD-10-CM | POA: Diagnosis not present

## 2016-01-29 DIAGNOSIS — F411 Generalized anxiety disorder: Secondary | ICD-10-CM | POA: Diagnosis not present

## 2016-01-30 ENCOUNTER — Encounter: Payer: Self-pay | Admitting: Cardiology

## 2016-01-30 ENCOUNTER — Ambulatory Visit (INDEPENDENT_AMBULATORY_CARE_PROVIDER_SITE_OTHER): Payer: Medicare Other | Admitting: Cardiology

## 2016-01-30 VITALS — BP 124/82 | HR 96 | Ht 63.0 in | Wt 287.5 lb

## 2016-01-30 DIAGNOSIS — E669 Obesity, unspecified: Secondary | ICD-10-CM

## 2016-01-30 DIAGNOSIS — R079 Chest pain, unspecified: Secondary | ICD-10-CM | POA: Diagnosis not present

## 2016-01-30 DIAGNOSIS — E785 Hyperlipidemia, unspecified: Secondary | ICD-10-CM | POA: Diagnosis not present

## 2016-01-30 NOTE — Patient Instructions (Signed)
Medication Instructions:  Your physician recommends that you continue on your current medications as directed. Please refer to the Current Medication list given to you today.  Labwork: None ordered.  Testing/Procedures: None ordered.  Follow-Up: Your physician recommends that you schedule a follow-up appointment as needed.   Any Other Special Instructions Will Be Listed Below (If Applicable).     If you need a refill on your cardiac medications before your next appointment, please call your pharmacy.   

## 2016-01-30 NOTE — Progress Notes (Signed)
Cardiology Office Note   Date:  01/30/2016   ID:  Faith Padilla, DOB Feb 20, 1951, MRN [id number]  Referring Doctor:  Keith Rake, MD   Cardiologist:   Wende Bushy, MD   Reason for consultation:  Chief Complaint  Patient presents with  . other    Follow up from stress echo. Meds reviewed by the patient verbally.       History of Present Illness: Faith Padilla is a 65 y.o. female who presents for chest pains and shortness of breath. She is here for follow-up after tests.  Shortness of breath have been going on for quite some time now. She attributes this to asthma. She can do very little activity/walking due to shortness of breath with exertion.  Patient says that she is under a lot of stress due to family issues. Current living situation is bringing on a lot of stress on her and she feels her symptoms are related to this.  Patient denies loss of consciousness, headache, fever, cough, colds, don pain, PND, orthopnea, edema.   ROS:  Please see the history of present illness. Aside from mentioned under HPI, all other systems are reviewed and negative.     Past Medical History  Diagnosis Date  . Glaucoma   . HLD (hyperlipidemia)   . Diverticulitis   . Sleep apnea   . Vaginal yeast infection   . Osteoarthritis     lumbar spine  . Acid reflux   . Microscopic hematuria   . Mixed incontinence   . Morbid obesity ([place])   . Anxiety and depression   . Asthma   . Kidney stones     Past Surgical History  Procedure Laterality Date  . Abdominal hysterectomy  1998  . Back surgery  1990/2014  . Cataract extraction, bilateral  2014  . Lithotripsy  2012  . Deviated nose septum surgery  2000     reports that she has never smoked. She has never used smokeless tobacco. She reports that she does not drink alcohol or use illicit drugs.   family history includes Allergic rhinitis in her sister; Asthma in her sister; COPD in her father; Cancer in her father and sister; Diabetes in  her mother; Heart disease in her mother; Hematuria in her daughter; Kidney disease in her mother; Liver cancer in her father. There is no history of Bladder Cancer.   Current Outpatient Prescriptions  Medication Sig Dispense Refill  . albuterol (PROVENTIL HFA;VENTOLIN HFA) 108 (90 BASE) MCG/ACT inhaler Use 2 puffs every 4 hours as needed to prevent cough or wheeze.  May use 2 puffs 10-20 minutes prior to exercise.  Use with spacer. 2 Inhaler 1  . atorvastatin (LIPITOR) 20 MG tablet Take 20 mg by mouth daily.    Marland Kitchen Bioflavonoid Products (VITAMIN C) CHEW Chew by mouth daily. Reported on 12/17/2015    . budesonide-formoterol (SYMBICORT) 160-4.5 MCG/ACT inhaler Use 2 puffs twice daily to prevent cough or wheeze.  Rinse, gargle, and spit after use. 3 Inhaler 1  . cetirizine (ZYRTEC) 10 MG tablet TAKE 1 TABLET BY MOUTH AT BEDTIME 30 tablet 4  . ciprofloxacin (CIPRO) 500 MG tablet Take 500 mg by mouth 2 (two) times daily.    Marland Kitchen co-enzyme Q-10 30 MG capsule Take 30 mg by mouth 3 (three) times daily.    . DULoxetine (CYMBALTA) 60 MG capsule Take 60 mg by mouth daily. Reported on 12/26/2015    . fluticasone (FLONASE) 50 MCG/ACT nasal spray INSTILL 1 TO 2 SPRAYS INTO  EACH NOSTRIL 1 TO 2 TIMES DAILY AS NEEDED 16 g 0  . meloxicam (MOBIC) 15 MG tablet Take 1 tablet (15 mg total) by mouth daily. 90 tablet 0  . montelukast (SINGULAIR) 10 MG tablet Take one daily in the evening to prevent cough or wheeze. 90 tablet 1  . MULTIPLE VITAMIN PO Take by mouth daily.    Marland Kitchen nystatin-triamcinolone ointment (MYCOLOG) Apply 1 application topically 2 (two) times daily. 30 g 2  . omeprazole (PRILOSEC) 20 MG capsule Take 1 capsule (20 mg total) by mouth 2 (two) times daily before a meal. 180 capsule 0  . ondansetron (ZOFRAN ODT) 4 MG disintegrating tablet Allow 1-2 tablets to dissolve in your mouth every 8 hours as needed for nausea/vomiting 30 tablet 0  . PATADAY 0.2 % SOLN Apply one drop in each eye once daily as needed for itchy  eyes. 3 Bottle 1  . PREMARIN vaginal cream Reported on 12/17/2015  3  . Turmeric 500 MG CAPS Take by mouth.    . Vitamin D, Ergocalciferol, (DRISDOL) 50000 UNITS CAPS capsule Takes 1 tablet by mouth weekly.  4   Current Facility-Administered Medications  Medication Dose Route Frequency Provider Last Rate Last Dose  . predniSONE (DELTASONE) tablet 10 mg  10 mg Oral UD Adelina Mings, MD        Allergies: Morphine    PHYSICAL EXAM: VS:  BP 124/82 mmHg  Pulse 96  Ht 5\' 3"  (1.6 m)  Wt 287 lb 8 oz (130.409 kg)  BMI 50.94 kg/m2  SpO2 98% , Body mass index is 50.94 kg/(m^2). Wt Readings from Last 3 Encounters:  01/30/16 287 lb 8 oz (130.409 kg)  01/22/16 280 lb (127.007 kg)  12/27/15 285 lb (129.275 kg)    GENERAL:  well developed, well nourished, morbidly obese, not in acute distress HEENT: normocephalic, pink conjunctivae, anicteric sclerae, no xanthelasma, normal dentition, oropharynx clear NECK:  no neck vein engorgement, JVP normal, no hepatojugular reflux, carotid upstroke brisk and symmetric, no bruit, no thyromegaly, no lymphadenopathy LUNGS:  good respiratory effort, clear to auscultation bilaterally CV:  PMI not displaced, no thrills, no lifts, S1 and S2 within normal limits, no palpable S3 or S4, no murmurs, no rubs, no gallops ABD:  Soft, nontender, nondistended, normoactive bowel sounds, no abdominal aortic bruit, no hepatomegaly, no splenomegaly MS: nontender back, no kyphosis, no scoliosis, no joint deformities EXT:  2+ DP/PT pulses, no edema, no varicosities, no cyanosis, no clubbing SKIN: warm, nondiaphoretic, normal turgor, no ulcers NEUROPSYCH: alert, oriented to person, place, and time, sensory/motor grossly intact, normal mood, appropriate affect  Recent Labs: 01/22/2016: ALT 23; BUN 17; Creatinine, Ser 1.00; Hemoglobin 12.9; Platelets 432; Potassium 4.0; Sodium 135   Lipid Panel    Component Value Date/Time   CHOL 152 11/20/2015 0820   TRIG 133 11/20/2015  0820   HDL 38* 11/20/2015 0820   CHOLHDL 4.0 11/20/2015 0820   LDLCALC 87 11/20/2015 0820     Other studies Reviewed:  EKG:  The ekg from 12/27/2015 was personally reviewed by me and it revealed sinus rhythm, 97 BPM. LAFB.  Additional studies/ records that were reviewed personally reviewed by me today include: Echo 01/10/2016: Left ventricle: The cavity size was normal. There was mild  concentric hypertrophy. Systolic function was normal. The  estimated ejection fraction was in the range of 60% to 65%. Wall  motion was normal; there were no regional wall motion  abnormalities. Left ventricular diastolic function parameters  were normal. - Left  atrium: The atrium was normal in size. - Right ventricle: Systolic function was normal. - Pulmonary arteries: Systolic pressure was within the normal  range.  Pharmacologic stress test 01/08/2016: Pharmacological myocardial perfusion imaging study with no significant ischemia Normal wall motion, EF estimated at 67% GI uptake artifact noted. No EKG changes concerning for ischemia at peak stress or in recovery. Low risk scan  ASSESSMENT AND PLAN:  Chest pain shortness of breath Abnormal EKG, LAFB Risk factors for coronary disease include age, postmenopausal state, hyperlipidemia, morbid obesity. No ischemia on stress test. This pertains a low likelihood of clinically significant CAD. Discuss findings with patient. Patient verbalized understanding. Risk factor modification recommended.   Hyperlipidemia Agree with statin therapy. PCP following labs.  Obesity Body mass index is 50.94 kg/(m^2).Marland Kitchen Recommend aggressive weight loss through diet and increased physical activity.   Current medicines are reviewed at length with the patient today.  The patient does not have concerns regarding medicines.  Labs/ tests ordered today include:  No orders of the defined types were placed in this encounter.    I had a lengthy and detailed  discussion with the patient regarding diagnoses, prognosis, diagnostic options, treatment options.  I counseled the patient on importance of lifestyle modification including heart healthy diet, regular physical activity.  Disposition:   FU with undersigned when necessary  I spent at least 25 minutes with the patient today and more than 50% of the time was spent counseling the patient and coordinating care.       Signed, Wende Bushy, MD  01/30/2016 1:24 PM    [facility]

## 2016-02-02 DIAGNOSIS — T7840XA Allergy, unspecified, initial encounter: Secondary | ICD-10-CM | POA: Diagnosis not present

## 2016-02-02 DIAGNOSIS — R1011 Right upper quadrant pain: Secondary | ICD-10-CM | POA: Diagnosis not present

## 2016-02-02 DIAGNOSIS — L500 Allergic urticaria: Secondary | ICD-10-CM | POA: Diagnosis not present

## 2016-02-05 ENCOUNTER — Telehealth: Payer: Self-pay | Admitting: Obstetrics and Gynecology

## 2016-02-05 ENCOUNTER — Ambulatory Visit: Admission: RE | Admit: 2016-02-05 | Payer: Medicare Other | Source: Ambulatory Visit | Admitting: Gastroenterology

## 2016-02-05 ENCOUNTER — Encounter: Admission: RE | Payer: Self-pay | Source: Ambulatory Visit

## 2016-02-05 SURGERY — COLONOSCOPY WITH PROPOFOL
Anesthesia: General

## 2016-02-05 NOTE — Telephone Encounter (Signed)
Please apologize to pt, yes we have her pessary and she can be scheduled for insertion. They do take a week or 2 to get back and Marcelline Mates has been on vacation. Thanks

## 2016-02-05 NOTE — Telephone Encounter (Signed)
This pt was her March 30 and said a pessary was ordered for her and no one has called her to let her know if it is here or for an appt. Is her pessary here.

## 2016-02-06 ENCOUNTER — Other Ambulatory Visit: Payer: Self-pay | Admitting: Pediatrics

## 2016-02-06 ENCOUNTER — Other Ambulatory Visit: Payer: Self-pay | Admitting: Allergy and Immunology

## 2016-02-07 ENCOUNTER — Ambulatory Visit (INDEPENDENT_AMBULATORY_CARE_PROVIDER_SITE_OTHER): Payer: Medicare Other | Admitting: Allergy and Immunology

## 2016-02-07 ENCOUNTER — Encounter: Payer: Self-pay | Admitting: Allergy and Immunology

## 2016-02-07 VITALS — BP 132/80 | HR 76 | Temp 97.9°F | Resp 16

## 2016-02-07 DIAGNOSIS — J4540 Moderate persistent asthma, uncomplicated: Secondary | ICD-10-CM | POA: Diagnosis not present

## 2016-02-07 DIAGNOSIS — J309 Allergic rhinitis, unspecified: Secondary | ICD-10-CM | POA: Diagnosis not present

## 2016-02-07 DIAGNOSIS — H1010 Acute atopic conjunctivitis, unspecified eye: Secondary | ICD-10-CM

## 2016-02-07 NOTE — Progress Notes (Signed)
     FOLLOW UP NOTE  RE: Faith Padilla MRN: [id number] DOB: October 26, 1950 [facility] [place] [address] [place] [id number] Date of Office Visit: 02/07/2016  Subjective:  Faith Padilla is a 65 y.o. female who presents today for Acute Visit  Assessment:   1. Moderate persistent asthma, with intermittent cough, in no respiratory distress, afebrile with clear lung exam.    2. Allergic rhinoconjunctivitis.   3.      Complex medical history. Plan:  1.  Continue Symbicort, Singulair and Zyrtec daily. 2.  Use Dymista one spray twice daily (until sample is empty). 3.  Prednisone 20mg  today, then once daily for 3 days. 4.  Saline nasal wash each evening at shower time and as needed throughout the day.  5.  Albuterol 2 puffs every 4 hours as needed--call with any persisting or recurring use. 6.  Follow-up In 4 months or sooner if needed.  HPI: Faith Padilla returns to the office with cough and congestion over the last 4 days with intermittent sneezing prompting use of albuterol about twice daily.  She has decided to minimize outdoor time with the warmer weather and fluctuant weather patterns as she finds different sensation when taking a deep breath.  She denies shortness of breath, chest tightness, chest congestion, productive cough, wheezing or difficulty breathing. There is no headache, sore throat, fever, rhinorrhea, postnasal drip or discolored drainage though her nose seems occasionally more dry. She feels her sleep is very good with CPAP, without nocturnal cough or disruption of sleep  and had no other recurring difficulty since her last visit with Dr. Shaune Leeks April.  Denies ED or urgent care visits, prednisone or antibiotic courses. Reports otherwise activity is normal.  Reports her medication refills are up-to-date.  Appetite is normal and no recent GI concerns.  Faith Padilla has a current medication list which includes the following prescription(s): atorvastatin, vitamin  c, cetirizine, co-enzyme q-10, duloxetine, fluticasone, meloxicam, montelukast, multiple vitamin, nystatin-triamcinolone ointment, omeprazole, premarin, proventil hfa, symbicort, turmeric, vitamin d (ergocalciferol).  Drug Allergies: Allergies  Allergen Reactions  . Morphine Hives   Objective:   Filed Vitals:   02/07/16 1045  BP: 132/80  Pulse: 76  Temp: 97.9 F (36.6 C)  Resp: 16   SpO2 Readings from Last 1 Encounters:  02/07/16 97%   Physical Exam  Constitutional: She is well-developed, well-nourished, and in no distress.  Interactive communicating easily in full sentences.  HENT:  Head: Atraumatic.  Right Ear: Tympanic membrane and ear canal normal.  Left Ear: Tympanic membrane and ear canal normal.  Nose: Mucosal edema present. No rhinorrhea. No epistaxis.  Mouth/Throat: Oropharynx is clear and moist and mucous membranes are normal. No oropharyngeal exudate, posterior oropharyngeal edema or posterior oropharyngeal erythema.  Neck: Neck supple.  Cardiovascular: Normal rate, S1 normal and S2 normal.   No murmur heard. Pulmonary/Chest: Effort normal. She has no wheezes. She has no rhonchi. She has no rales.  Lymphadenopathy:    She has no cervical adenopathy.   Diagnostics: Spirometry:  FVC 1.99--71%, FEV1 1.76--79%; postbronchodilator essentially no change.   (See scanned image).  ACT=18.    Faith Padilla M. Ishmael Holter, MD  cc: Keith Rake, MD

## 2016-02-07 NOTE — Patient Instructions (Addendum)
  Continue Symbicort, Singulair and Zyrtec daily.  Use Dymista one spray twice daily (until sample is empty).  Prednisone 20mg  today, then once daily for 3 days.  Saline nasal wash each evening at shower time.  Albuterol 2 puffs every 4 hours as needed.  Follow-up In 4 months or sooner if needed.

## 2016-02-12 DIAGNOSIS — F3341 Major depressive disorder, recurrent, in partial remission: Secondary | ICD-10-CM | POA: Diagnosis not present

## 2016-02-12 DIAGNOSIS — F411 Generalized anxiety disorder: Secondary | ICD-10-CM | POA: Diagnosis not present

## 2016-02-26 ENCOUNTER — Ambulatory Visit (INDEPENDENT_AMBULATORY_CARE_PROVIDER_SITE_OTHER): Payer: Medicare Other | Admitting: Obstetrics and Gynecology

## 2016-02-26 ENCOUNTER — Encounter: Payer: Self-pay | Admitting: Obstetrics and Gynecology

## 2016-02-26 VITALS — BP 121/74 | HR 111 | Ht 63.0 in | Wt 285.5 lb

## 2016-02-26 DIAGNOSIS — F3341 Major depressive disorder, recurrent, in partial remission: Secondary | ICD-10-CM | POA: Diagnosis not present

## 2016-02-26 DIAGNOSIS — N3946 Mixed incontinence: Secondary | ICD-10-CM

## 2016-02-26 DIAGNOSIS — N816 Rectocele: Secondary | ICD-10-CM | POA: Diagnosis not present

## 2016-02-26 DIAGNOSIS — N8110 Cystocele, unspecified: Secondary | ICD-10-CM | POA: Diagnosis not present

## 2016-02-26 DIAGNOSIS — Z87442 Personal history of urinary calculi: Secondary | ICD-10-CM

## 2016-02-26 DIAGNOSIS — F411 Generalized anxiety disorder: Secondary | ICD-10-CM | POA: Diagnosis not present

## 2016-02-26 DIAGNOSIS — N952 Postmenopausal atrophic vaginitis: Secondary | ICD-10-CM | POA: Diagnosis not present

## 2016-02-26 MED ORDER — OXYQUINOLONE SULFATE 0.025 % VA GEL
1.0000 | VAGINAL | Status: DC
Start: 2016-02-26 — End: 2016-03-12

## 2016-02-26 NOTE — Progress Notes (Signed)
    GYNECOLOGY PROGRESS NOTE  Subjective:    Patient ID: Faith Padilla, female    DOB: 1950/11/18, 65 y.o.   MRN: [id number]  HPI  Patient is a 65 y.o. female who presents for pessary insertion for 2nd degree cystocele and rectocele and mixed urinary incontinence. Notes no change in symptoms. Currently denies any other major complaints today.   The following portions of the patient's history were reviewed and updated as appropriate: allergies, current medications, past family history, past medical history, past social history, past surgical history and problem list.  Review of Systems Pertinent items noted in HPI and remainder of comprehensive ROS otherwise negative.   Objective:   Blood pressure 121/74, pulse 111, height 5\' 3"  (1.6 m), weight 285 lb 8 oz (129.502 kg). General appearance: alert and no distress Abdomen: soft, non-tender; bowel sounds normal; no masses,  no organomegaly Pelvic: External genitalia: normal general appearance  Urinary system: urethral meatus normal and bladder not palpable  Vaginal: atrophic mucosa, cystocele present, 2nd degree and rectocele present, 2nd degree  Uterus and Cervix: removed surgically  Adnexa: non palpable and no tenderness bilaterally Neurologic: Grossly normal  Skin: no lesions or rashes   Assessment:   Cystocele and rectocele   Urinary incontinence (mixed) Atrophic vagina H/o kidney stones  Plan:   Pessary insertion performed today, inserted size 2 ring with support.   Advised on use of Premarin cream weekly, and Trimo-San gel weekly (alternating days).  RTC in 2 -3 weeks for pessary check.   Rubie Maid, MD [facility]

## 2016-03-04 ENCOUNTER — Emergency Department
Admission: EM | Admit: 2016-03-04 | Discharge: 2016-03-04 | Disposition: A | Discharge status: Home / Self Care | Payer: Medicare Other | Attending: Emergency Medicine | Admitting: Emergency Medicine

## 2016-03-04 ENCOUNTER — Encounter: Payer: Self-pay | Admitting: Emergency Medicine

## 2016-03-04 ENCOUNTER — Emergency Department: Payer: Medicare Other

## 2016-03-04 DIAGNOSIS — J4541 Moderate persistent asthma with (acute) exacerbation: Secondary | ICD-10-CM | POA: Insufficient documentation

## 2016-03-04 DIAGNOSIS — Z79899 Other long term (current) drug therapy: Secondary | ICD-10-CM | POA: Diagnosis not present

## 2016-03-04 DIAGNOSIS — N200 Calculus of kidney: Secondary | ICD-10-CM | POA: Diagnosis not present

## 2016-03-04 DIAGNOSIS — M1711 Unilateral primary osteoarthritis, right knee: Secondary | ICD-10-CM | POA: Insufficient documentation

## 2016-03-04 DIAGNOSIS — E785 Hyperlipidemia, unspecified: Secondary | ICD-10-CM | POA: Insufficient documentation

## 2016-03-04 DIAGNOSIS — M47816 Spondylosis without myelopathy or radiculopathy, lumbar region: Secondary | ICD-10-CM | POA: Insufficient documentation

## 2016-03-04 DIAGNOSIS — R109 Unspecified abdominal pain: Secondary | ICD-10-CM | POA: Diagnosis not present

## 2016-03-04 DIAGNOSIS — R1031 Right lower quadrant pain: Secondary | ICD-10-CM | POA: Diagnosis not present

## 2016-03-04 LAB — COMPREHENSIVE METABOLIC PANEL
ALT: 23 U/L (ref 14–54)
AST: 24 U/L (ref 15–41)
Albumin: 3.9 g/dL (ref 3.5–5.0)
Alkaline Phosphatase: 101 U/L (ref 38–126)
Anion gap: 10 (ref 5–15)
BUN: 16 mg/dL (ref 6–20)
CHLORIDE: 104 mmol/L (ref 101–111)
CO2: 25 mmol/L (ref 22–32)
CREATININE: 1 mg/dL (ref 0.44–1.00)
Calcium: 9.9 mg/dL (ref 8.9–10.3)
GFR calc non Af Amer: 58 mL/min — ABNORMAL LOW (ref >60)
Glucose, Bld: 94 mg/dL (ref 65–99)
Potassium: 4.3 mmol/L (ref 3.5–5.1)
SODIUM: 139 mmol/L (ref 135–145)
Total Bilirubin: 0.5 mg/dL (ref 0.3–1.2)
Total Protein: 7.6 g/dL (ref 6.5–8.1)

## 2016-03-04 LAB — URINALYSIS COMPLETE WITH MICROSCOPIC (ARMC ONLY)
BACTERIA UA: NONE SEEN
Bilirubin Urine: NEGATIVE
GLUCOSE, UA: NEGATIVE mg/dL
Hgb urine dipstick: NEGATIVE
Ketones, ur: NEGATIVE mg/dL
Leukocytes, UA: NEGATIVE
NITRITE: NEGATIVE
PROTEIN: NEGATIVE mg/dL
SPECIFIC GRAVITY, URINE: 1.014 (ref 1.005–1.030)
pH: 7 (ref 5.0–8.0)

## 2016-03-04 LAB — LIPASE, BLOOD: LIPASE: 22 U/L (ref 11–51)

## 2016-03-04 LAB — CBC
HCT: 38.2 % (ref 35.0–47.0)
Hemoglobin: 13 g/dL (ref 12.0–16.0)
MCH: 28.6 pg (ref 26.0–34.0)
MCHC: 34.2 g/dL (ref 32.0–36.0)
MCV: 83.9 fL (ref 80.0–100.0)
PLATELETS: 418 10*3/uL (ref 150–440)
RBC: 4.55 MIL/uL (ref 3.80–5.20)
RDW: 15.4 % — AB (ref 11.5–14.5)
WBC: 9.2 10*3/uL (ref 3.6–11.0)

## 2016-03-04 MED ORDER — TRAMADOL HCL 50 MG PO TABS: 50.0000 mg | ORAL_TABLET | Freq: Four times a day (QID) | ORAL | Status: AC | PRN

## 2016-03-04 NOTE — ED Notes (Signed)
Pt sitting up in chair in exam room with no distress noted; st x 10 days having right upper quad pain radiating into back with no accomp symptoms; denies hx of same; +BS, abd soft/nondist/nontender; pt currently drinking water and instructed not to take in any food or water until complete evaluation and results are received by the ED provider; pt voices understanding

## 2016-03-04 NOTE — ED Provider Notes (Addendum)
[facility] Emergency Department Provider Note  Time seen: 7:06 PM  I have reviewed the triage vital signs and the nursing notes.   HISTORY  Chief Complaint Abdominal Pain    HPI Faith Padilla is a 65 y.o. female with a past medical history of hyperlipidemia, diverticulitis, arthritis, anxiety, kidney stones, who presents the emergency department with right flank pain. According to the patient were the past 10 days she has been extrinsic right flank pain which she describes as moderate and constant throbbing type pain. Denies nausea, vomiting, diarrhea, dysuria. Patient does state a history of kidney stones which feels somewhat similar. Denies any hematuria, dysuria, does state occasional cloudy urine.     Past Medical History  Diagnosis Date  . Glaucoma   . HLD (hyperlipidemia)   . Diverticulitis   . Sleep apnea   . Vaginal yeast infection   . Osteoarthritis     lumbar spine  . Acid reflux   . Microscopic hematuria   . Mixed incontinence   . Morbid obesity ([place])   . Anxiety and depression   . Asthma   . Kidney stones     Patient Active Problem List   Diagnosis Date Noted  . Gastroesophageal reflux disease without esophagitis 12/17/2015  . Other allergic rhinitis 12/17/2015  . Seasonal allergic conjunctivitis 12/17/2015  . Cystocele, grade 2 11/21/2015  . Microscopic hematuria 11/21/2015  . Esophageal motility disorder 11/19/2015  . Vertigo 10/15/2015  . Oral candidiasis 10/15/2015  . Sleep apnea 10/11/2015  . Subacute frontal sinusitis 10/10/2015  . Chest pain at rest 10/10/2015  . Hyperlipidemia 08/14/2015  . Hyperglycemia 08/14/2015  . Asthma with acute exacerbation 07/18/2015  . Moderate persistent asthma 07/18/2015  . Rash of face 05/31/2015  . Dermatitis 05/31/2015  . Rosacea 05/31/2015  . Incontinence 04/12/2015  . Atrophic vaginitis 04/12/2015  . Osteoarthritis of right knee 03/29/2015  . Acid reflux 12/11/2014  . Asthma  with allergic rhinitis 12/11/2014  . Airway hyperreactivity 12/11/2014  . Clinical depression 12/11/2014  . Diverticulitis 12/11/2014  . Auditory acuity evaluation 12/11/2014  . Glaucoma 12/11/2014  . Gravida 1 12/11/2014  . Hot flash, menopausal 12/11/2014  . HLD (hyperlipidemia) 12/11/2014  . Calculus of kidney 12/11/2014  . Degenerative arthritis of lumbar spine 12/11/2014  . Obstructive apnea 12/11/2014  . Urge incontinence 12/11/2014    Past Surgical History  Procedure Laterality Date  . Abdominal hysterectomy  1998  . Back surgery  1990/2014  . Cataract extraction, bilateral  2014  . Lithotripsy  2012  . Deviated nose septum surgery  2000    Current Outpatient Rx  Name  Route  Sig  Dispense  Refill  . atorvastatin (LIPITOR) 20 MG tablet   Oral   Take 20 mg by mouth daily. Reported on 02/07/2016         . Bioflavonoid Products (VITAMIN C) CHEW   Oral   Chew by mouth daily. Reported on 12/17/2015         . cetirizine (ZYRTEC) 10 MG tablet      TAKE 1 TABLET BY MOUTH AT BEDTIME   30 tablet   4   . ciprofloxacin (CIPRO) 500 MG tablet   Oral   Take 500 mg by mouth 2 (two) times daily. Reported on 02/07/2016         . co-enzyme Q-10 30 MG capsule   Oral   Take 30 mg by mouth 3 (three) times daily.         Marland Kitchen  DULoxetine (CYMBALTA) 60 MG capsule   Oral   Take 60 mg by mouth daily. Reported on 12/26/2015         . famotidine (PEPCID) 20 MG tablet      TK 1 T PO D      0   . fluticasone (FLONASE) 50 MCG/ACT nasal spray      INSTILL 1 TO 2 SPRAYS INTO EACH NOSTRIL 1 TO 2 TIMES DAILY AS NEEDED   16 g   0   . meloxicam (MOBIC) 15 MG tablet   Oral   Take 1 tablet (15 mg total) by mouth daily.   90 tablet   0   . montelukast (SINGULAIR) 10 MG tablet      Take one daily in the evening to prevent cough or wheeze.   90 tablet   1   . MULTIPLE VITAMIN PO   Oral   Take by mouth daily.         Marland Kitchen nystatin-triamcinolone ointment (MYCOLOG)    Topical   Apply 1 application topically 2 (two) times daily.   30 g   2   . omeprazole (PRILOSEC) 20 MG capsule   Oral   Take 1 capsule (20 mg total) by mouth 2 (two) times daily before a meal.   180 capsule   0   . ondansetron (ZOFRAN ODT) 4 MG disintegrating tablet      Allow 1-2 tablets to dissolve in your mouth every 8 hours as needed for nausea/vomiting   30 tablet   0   . OXYQUINOLONE SULFATE VAGINAL 0.025 % GEL   Vaginal   Place 1 Applicatorful vaginally once a week.   1 Tube   3   . PATADAY 0.2 % SOLN      Apply one drop in each eye once daily as needed for itchy eyes.   3 Bottle   1     Dispense as written.   . predniSONE (DELTASONE) 20 MG tablet      TK 3 TS PO D FOR 5 DAYS      0   . PREMARIN vaginal cream      Reported on 02/07/2016      3     Dispense as written.   Marland Kitchen PROVENTIL HFA 108 (90 Base) MCG/ACT inhaler      INHALE2 PUFFS BY MOUTH EVERY 4 HOURS AS NEEDED TO PREVENT COUGH OR WHEEZE, MAY INHALE 2 PUFFS BY MOUTH 10 TO 20 MINUTES PRIOR EXERCISE   13.4 g   0   . SYMBICORT 160-4.5 MCG/ACT inhaler      INHALE 2 PUFFS BY MOUTH TWICE DAILY TO PREVENT COUGH OR WHEEZE. RINSE,GARGLE AND SPIT AFTER USE   30.6 g   0   . Turmeric 500 MG CAPS   Oral   Take by mouth.         . Vitamin D, Ergocalciferol, (DRISDOL) 50000 UNITS CAPS capsule      Takes 1 tablet by mouth weekly.      4     Allergies Morphine  Family History  Problem Relation Age of Onset  . Heart disease Mother   . Diabetes Mother   . Kidney disease Mother   . Cancer Father     lung cancer  . COPD Father   . Liver cancer Father   . Cancer Sister     Brain Cancer  . Allergic rhinitis Sister   . Asthma Sister   . Hematuria Daughter   .  Bladder Cancer Neg Hx     Social History Social History  Substance Use Topics  . Smoking status: Never Smoker   . Smokeless tobacco: Never Used  . Alcohol Use: No    Review of Systems Constitutional: Negative for  fever. Cardiovascular: Negative for chest pain. Respiratory: Negative for shortness of breath. Gastrointestinal:Right flank pain. Genitourinary: Negative for dysuria. Negative for hematuria. Musculoskeletal: Right flank pain Neurological: Negative for headache 10-point ROS otherwise negative.  ____________________________________________   PHYSICAL EXAM:  VITAL SIGNS: ED Triage Vitals  Enc Vitals Group     BP 03/04/16 1812 149/92 mmHg     Pulse Rate 03/04/16 1812 100     Resp 03/04/16 1812 20     Temp 03/04/16 1812 98.5 F (36.9 C)     Temp Source 03/04/16 1812 Oral     SpO2 03/04/16 1812 95 %     Weight 03/04/16 1812 280 lb (127.007 kg)     Height 03/04/16 1812 5\' 3"  (1.6 m)     Head Cir --      Peak Flow --      Pain Score --      Pain Loc --      Pain Edu? --      Excl. in [place]? --     Constitutional: Alert and oriented. Well appearing and in no distress. Eyes: Normal exam ENT   Head: Normocephalic and atraumatic   Mouth/Throat: Mucous membranes are moist. Cardiovascular: Normal rate, regular rhythm. No murmur Respiratory: Normal respiratory effort without tachypnea nor retractions. Breath sounds are clear Gastrointestinal: Soft, minimal right flank tenderness palpation/right lateral abdominal tenderness palpation. No rebound or guarding. No distention. No CVA tenderness. Musculoskeletal: Nontender with normal range of motion in all extremities.  Neurologic:  Normal speech and language. No gross focal neurologic deficits Skin:  Skin is warm, dry and intact.  Psychiatric: Mood and affect are normal.  ____________________________________________     RADIOLOGY  CT shows no acute abnormality  EKG reviewed and interpreted by myself shows sinus tachycardia 101 bpm, narrow QRS, left axis deviation, normal intervals, nonspecific ST changes. No ST elevation.  ____________________________________________   INITIAL IMPRESSION / ASSESSMENT AND PLAN / ED  COURSE  Pertinent labs & imaging results that were available during my care of the patient were reviewed by me and considered in my medical decision making (see chart for details).  Patient presents the emergency department with right flank pain for the past 10 days. History of kidney stones in the past. Blood work is largely within normal limits including LFTs. We will check a urinalysis, CT abdomen/pelvis to evaluate for ureterolithiasis. Patient agreeable plan.  CT shows no acute abnormality. Labs are within normal limits. I discussed the patient follow up with GI medicine or Gen. surgery to consider performing a HIDA scan. Patient has no right upper quadrant tenderness at this time. We'll discharge with Ultram as needed in general surgery follow-up.  ____________________________________________   FINAL CLINICAL IMPRESSION(S) / ED DIAGNOSES  Right flank pain   Harvest Dark, MD 03/04/16 2034  Harvest Dark, MD 03/04/16 2038

## 2016-03-04 NOTE — Discharge Instructions (Signed)
Flank Pain °Flank pain refers to pain that is located on the side of the body between the upper abdomen and the back. The pain may occur over a short period of time (acute) or may be long-term or reoccurring (chronic). It may be mild or severe. Flank pain can be caused by many things. °CAUSES  °Some of the more common causes of flank pain include: °· Muscle strains.   °· Muscle spasms.   °· A disease of your spine (vertebral disk disease).   °· A lung infection (pneumonia).   °· Fluid around your lungs (pulmonary edema).   °· A kidney infection.   °· Kidney stones.   °· A very painful skin rash caused by the chickenpox virus (shingles).   °· Gallbladder disease.   °HOME CARE INSTRUCTIONS  °Home care will depend on the cause of your pain. In general, °· Rest as directed by your caregiver. °· Drink enough fluids to keep your urine clear or pale yellow. °· Only take over-the-counter or prescription medicines as directed by your caregiver. Some medicines may help relieve the pain. °· Tell your caregiver about any changes in your pain. °· Follow up with your caregiver as directed. °SEEK IMMEDIATE MEDICAL CARE IF:  °· Your pain is not controlled with medicine.   °· You have new or worsening symptoms. °· Your pain increases.   °· You have abdominal pain.   °· You have shortness of breath.   °· You have persistent nausea or vomiting.   °· You have swelling in your abdomen.   °· You feel faint or pass out.   °· You have blood in your urine. °· You have a fever or persistent symptoms for more than 2-3 days. °· You have a fever and your symptoms suddenly get worse. °MAKE SURE YOU:  °· Understand these instructions. °· Will watch your condition. °· Will get help right away if you are not doing well or get worse. °  °This information is not intended to replace advice given to you by your health care provider. Make sure you discuss any questions you have with your health care provider. °  °Document Released: 10/16/2005 Document  Revised: 05/19/2012 Document Reviewed: 04/08/2012 °Elsevier Interactive Patient Education ©2016 [employer] ° °

## 2016-03-04 NOTE — ED Notes (Signed)
Developed RUQ pain which radiates into back for about 1 week  Denies any nausea or vomiting.

## 2016-03-06 ENCOUNTER — Other Ambulatory Visit: Payer: Self-pay | Admitting: Family Medicine

## 2016-03-06 ENCOUNTER — Other Ambulatory Visit: Payer: Self-pay

## 2016-03-12 ENCOUNTER — Encounter: Payer: Self-pay | Admitting: Family Medicine

## 2016-03-12 ENCOUNTER — Ambulatory Visit (INDEPENDENT_AMBULATORY_CARE_PROVIDER_SITE_OTHER): Payer: Medicare Other | Admitting: Family Medicine

## 2016-03-12 VITALS — BP 138/72 | HR 125 | Temp 99.0°F | Resp 18 | Ht 63.0 in | Wt 283.3 lb

## 2016-03-12 DIAGNOSIS — R1011 Right upper quadrant pain: Secondary | ICD-10-CM | POA: Insufficient documentation

## 2016-03-12 HISTORY — DX: Right upper quadrant pain: R10.11

## 2016-03-12 MED ORDER — OXYCODONE HCL 10 MG PO TABS: 10.0000 mg | ORAL_TABLET | Freq: Three times a day (TID) | ORAL | Status: DC | PRN

## 2016-03-12 NOTE — Progress Notes (Signed)
Name: Faith Padilla   MRN: [id number]    DOB: 05-17-1951   Date:03/12/2016       Progress Note  Subjective  Chief Complaint  Chief Complaint  Patient presents with  . Acute Visit    Rt side pain     Abdominal Pain This is a new problem. The current episode started 1 to 4 weeks ago (3 weeks). The onset quality is sudden. The problem has been gradually worsening. The pain is located in the RUQ. The pain is at a severity of 10/10. The abdominal pain radiates to the back. Associated symptoms include diarrhea, flatus (able to pass gas.) and nausea. Pertinent negatives include no constipation, fever or vomiting. The pain is relieved by nothing. She has tried oral narcotic analgesics (Tramadol prescribed by the ER) for the symptoms. Prior diagnostic workup includes CT scan and GI consult (CT Scan A/P ordered by ER uremarkable, referred [place].).    Past Medical History  Diagnosis Date  . Glaucoma   . HLD (hyperlipidemia)   . Diverticulitis   . Sleep apnea   . Vaginal yeast infection   . Osteoarthritis     lumbar spine  . Acid reflux   . Microscopic hematuria   . Mixed incontinence   . Morbid obesity ([place])   . Anxiety and depression   . Asthma   . Kidney stones     Past Surgical History  Procedure Laterality Date  . Abdominal hysterectomy  1998  . Back surgery  1990/2014  . Cataract extraction, bilateral  2014  . Lithotripsy  2012  . Deviated nose septum surgery  2000    Family History  Problem Relation Age of Onset  . Heart disease Mother   . Diabetes Mother   . Kidney disease Mother   . Cancer Father     lung cancer  . COPD Father   . Liver cancer Father   . Cancer Sister     Brain Cancer  . Allergic rhinitis Sister   . Asthma Sister   . Hematuria Daughter   . Bladder Cancer Neg Hx     Social History   Social History  . Marital Status: Single    Spouse Name: N/A  . Number of Children: N/A  . Years of Education: N/A   Occupational History  .  Not on file.   Social History Main Topics  . Smoking status: Never Smoker   . Smokeless tobacco: Never Used  . Alcohol Use: No  . Drug Use: No  . Sexual Activity: No   Other Topics Concern  . Not on file   Social History Narrative     Current outpatient prescriptions:  .  atorvastatin (LIPITOR) 20 MG tablet, TAKE 1 TABLET(20 MG) BY MOUTH AT BEDTIME, Disp: 90 tablet, Rfl: 0 .  cetirizine (ZYRTEC) 10 MG tablet, TAKE 1 TABLET BY MOUTH AT BEDTIME, Disp: 30 tablet, Rfl: 4 .  DULoxetine (CYMBALTA) 60 MG capsule, Take 60 mg by mouth daily. Reported on 12/26/2015, Disp: , Rfl:  .  fluticasone (FLONASE) 50 MCG/ACT nasal spray, INSTILL 1 TO 2 SPRAYS INTO EACH NOSTRIL 1 TO 2 TIMES DAILY AS NEEDED, Disp: 16 g, Rfl: 0 .  meloxicam (MOBIC) 15 MG tablet, TAKE 1 TABLET(15 MG) BY MOUTH DAILY, Disp: 90 tablet, Rfl: 0 .  montelukast (SINGULAIR) 10 MG tablet, Take one daily in the evening to prevent cough or wheeze., Disp: 90 tablet, Rfl: 1 .  MULTIPLE VITAMIN PO, Take by mouth daily., Disp: , Rfl:  .  nystatin-triamcinolone ointment (MYCOLOG), Apply 1 application topically 2 (two) times daily., Disp: 30 g, Rfl: 2 .  omeprazole (PRILOSEC) 20 MG capsule, Take 1 capsule (20 mg total) by mouth 2 (two) times daily before a meal., Disp: 180 capsule, Rfl: 0 .  PATADAY 0.2 % SOLN, Apply one drop in each eye once daily as needed for itchy eyes., Disp: 3 Bottle, Rfl: 1 .  PREMARIN vaginal cream, Reported on 02/07/2016, Disp: , Rfl: 3 .  PROVENTIL HFA 108 (90 Base) MCG/ACT inhaler, INHALE2 PUFFS BY MOUTH EVERY 4 HOURS AS NEEDED TO PREVENT COUGH OR WHEEZE, MAY INHALE 2 PUFFS BY MOUTH 10 TO 20 MINUTES PRIOR EXERCISE, Disp: 13.4 g, Rfl: 0 .  SYMBICORT 160-4.5 MCG/ACT inhaler, INHALE 2 PUFFS BY MOUTH TWICE DAILY TO PREVENT COUGH OR WHEEZE. RINSE,GARGLE AND SPIT AFTER USE, Disp: 30.6 g, Rfl: 0 .  traMADol (ULTRAM) 50 MG tablet, Take 1 tablet (50 mg total) by mouth every 6 (six) hours as needed., Disp: 20 tablet, Rfl: 0 .   Turmeric 500 MG CAPS, Take by mouth., Disp: , Rfl:   Current facility-administered medications:  .  predniSONE (DELTASONE) tablet 10 mg, 10 mg, Oral, UD, Adelina Mings, MD  Allergies  Allergen Reactions  . Morphine Hives     Review of Systems  Constitutional: Negative for fever.  Gastrointestinal: Positive for nausea, abdominal pain, diarrhea and flatus (able to pass gas.). Negative for vomiting and constipation.    Objective  Filed Vitals:   03/12/16 0906  BP: 138/72  Pulse: 125  Temp: 99 F (37.2 C)  TempSrc: Oral  Resp: 18  Height: 5\' 3"  (1.6 m)  Weight: 283 lb 4.8 oz (128.504 kg)  SpO2: 93%    Physical Exam  Constitutional: She is well-developed, well-nourished, and in no distress.  Cardiovascular: Regular rhythm, S1 normal and S2 normal.  Tachycardia present.   No murmur heard. Pulmonary/Chest: Effort normal. She has no decreased breath sounds. She has no rhonchi.  Abdominal: Normal appearance. Bowel sounds are not hypoactive. There is tenderness in the right upper quadrant. There is CVA tenderness. There is no guarding.  Right Upper Quadrant tenderness to palpation, radiating to right upper flank and back.  Nursing note and vitals reviewed.      Assessment & Plan  1. Abdominal pain, RUQ (right upper quadrant) Suspect cholecystitis, obtain ultrasound of right upper quadrant and pertinent lab work. Started on oxycodone taken every 8 hours as needed for pain relief - US Abdomen Limited RUQ; Future - Amylase - Lipase - CBC w/Diff/Platelet - Hepatic function panel - Basic Metabolic Panel (BMET  - Oxycodone HCl 10 MG TABS; Take 1 tablet (10 mg total) by mouth every 8 (eight) hours as needed.  Dispense: 21 tablet; Refill: 0   Anastacio Bua [facility] 03/12/2016 9:18 AM

## 2016-03-13 ENCOUNTER — Ambulatory Visit
Admission: RE | Admit: 2016-03-13 | Discharge: 2016-03-13 | Disposition: A | Discharge status: Home / Self Care | Payer: Medicare Other | Source: Ambulatory Visit | Attending: Family Medicine | Admitting: Family Medicine

## 2016-03-13 DIAGNOSIS — K760 Fatty (change of) liver, not elsewhere classified: Secondary | ICD-10-CM | POA: Diagnosis not present

## 2016-03-13 DIAGNOSIS — R1011 Right upper quadrant pain: Secondary | ICD-10-CM | POA: Diagnosis not present

## 2016-03-13 LAB — LIPASE: Lipase: 21 U/L (ref 0–59)

## 2016-03-13 LAB — CBC WITH DIFFERENTIAL/PLATELET
BASOS ABS: 0.1 10*3/uL (ref 0.0–0.2)
BASOS: 0 %
EOS (ABSOLUTE): 0.1 10*3/uL (ref 0.0–0.4)
Eos: 1 %
Hematocrit: 39.4 % (ref 34.0–46.6)
Hemoglobin: 12.6 g/dL (ref 11.1–15.9)
IMMATURE GRANS (ABS): 0 10*3/uL (ref 0.0–0.1)
IMMATURE GRANULOCYTES: 0 %
LYMPHS: 8 %
Lymphocytes Absolute: 1 10*3/uL (ref 0.7–3.1)
MCH: 27.8 pg (ref 26.6–33.0)
MCHC: 32 g/dL (ref 31.5–35.7)
MCV: 87 fL (ref 79–97)
Monocytes Absolute: 1.2 10*3/uL — ABNORMAL HIGH (ref 0.1–0.9)
Monocytes: 10 %
NEUTROS PCT: 81 %
Neutrophils Absolute: 9.3 10*3/uL — ABNORMAL HIGH (ref 1.4–7.0)
PLATELETS: 409 10*3/uL — AB (ref 150–379)
RBC: 4.53 x10E6/uL (ref 3.77–5.28)
RDW: 15.3 % (ref 12.3–15.4)
WBC: 11.6 10*3/uL — ABNORMAL HIGH (ref 3.4–10.8)

## 2016-03-13 LAB — BASIC METABOLIC PANEL
BUN / CREAT RATIO: 21 (ref 12–28)
BUN: 17 mg/dL (ref 8–27)
CO2: 19 mmol/L (ref 18–29)
CREATININE: 0.82 mg/dL (ref 0.57–1.00)
Calcium: 9.2 mg/dL (ref 8.7–10.3)
Chloride: 102 mmol/L (ref 96–106)
GFR, EST AFRICAN AMERICAN: 87 mL/min/{1.73_m2} (ref >59)
GFR, EST NON AFRICAN AMERICAN: 75 mL/min/{1.73_m2} (ref >59)
Glucose: 102 mg/dL — ABNORMAL HIGH (ref 65–99)
Potassium: 4.6 mmol/L (ref 3.5–5.2)
Sodium: 142 mmol/L (ref 134–144)

## 2016-03-13 LAB — HEPATIC FUNCTION PANEL
ALBUMIN: 4 g/dL (ref 3.6–4.8)
ALT: 20 IU/L (ref 0–32)
AST: 22 IU/L (ref 0–40)
Alkaline Phosphatase: 116 IU/L (ref 39–117)
BILIRUBIN TOTAL: 0.2 mg/dL (ref 0.0–1.2)
BILIRUBIN, DIRECT: 0.11 mg/dL (ref 0.00–0.40)
TOTAL PROTEIN: 6.9 g/dL (ref 6.0–8.5)

## 2016-03-13 LAB — AMYLASE: AMYLASE: 26 U/L — AB (ref 31–124)

## 2016-03-14 ENCOUNTER — Other Ambulatory Visit: Payer: Self-pay | Admitting: Family Medicine

## 2016-03-14 ENCOUNTER — Encounter: Payer: Self-pay | Admitting: *Deleted

## 2016-03-14 DIAGNOSIS — R1011 Right upper quadrant pain: Secondary | ICD-10-CM

## 2016-03-14 MED ORDER — CIPROFLOXACIN HCL 500 MG PO TABS: 500.0000 mg | ORAL_TABLET | Freq: Two times a day (BID) | ORAL | Status: DC

## 2016-03-14 MED ORDER — METRONIDAZOLE 500 MG PO TABS: 500.0000 mg | ORAL_TABLET | Freq: Three times a day (TID) | ORAL | Status: DC

## 2016-03-17 ENCOUNTER — Encounter: Payer: Self-pay | Admitting: Anesthesiology

## 2016-03-20 ENCOUNTER — Ambulatory Visit: Admission: RE | Admit: 2016-03-20 | Payer: Medicare Other | Source: Ambulatory Visit | Admitting: Gastroenterology

## 2016-03-20 ENCOUNTER — Other Ambulatory Visit: Payer: Self-pay

## 2016-03-20 HISTORY — DX: Adverse effect of unspecified anesthetic, initial encounter: T41.45XA

## 2016-03-20 HISTORY — DX: Presence of dental prosthetic device (complete) (partial): Z97.2

## 2016-03-20 HISTORY — DX: Reserved for inherently not codable concepts without codable children: IMO0001

## 2016-03-20 HISTORY — DX: Other complications of anesthesia, initial encounter: T88.59XA

## 2016-03-20 SURGERY — COLONOSCOPY WITH PROPOFOL
Anesthesia: Choice

## 2016-03-25 ENCOUNTER — Ambulatory Visit
Admission: RE | Admit: 2016-03-25 | Discharge: 2016-03-25 | Disposition: A | Discharge status: Home / Self Care | Payer: Medicare Other | Source: Ambulatory Visit | Attending: Gastroenterology | Admitting: Gastroenterology

## 2016-03-25 ENCOUNTER — Ambulatory Visit: Payer: Medicare Other | Admitting: Anesthesiology

## 2016-03-25 ENCOUNTER — Ambulatory Visit: Payer: Medicare Other | Admitting: Obstetrics and Gynecology

## 2016-03-25 ENCOUNTER — Encounter
Admission: RE | Disposition: A | Discharge status: Home / Self Care | Payer: Self-pay | Source: Ambulatory Visit | Attending: Gastroenterology

## 2016-03-25 DIAGNOSIS — Z885 Allergy status to narcotic agent status: Secondary | ICD-10-CM | POA: Insufficient documentation

## 2016-03-25 DIAGNOSIS — R1310 Dysphagia, unspecified: Secondary | ICD-10-CM | POA: Insufficient documentation

## 2016-03-25 DIAGNOSIS — M479 Spondylosis, unspecified: Secondary | ICD-10-CM | POA: Diagnosis not present

## 2016-03-25 DIAGNOSIS — K2950 Unspecified chronic gastritis without bleeding: Secondary | ICD-10-CM | POA: Diagnosis not present

## 2016-03-25 DIAGNOSIS — Z90710 Acquired absence of both cervix and uterus: Secondary | ICD-10-CM | POA: Diagnosis not present

## 2016-03-25 DIAGNOSIS — J45909 Unspecified asthma, uncomplicated: Secondary | ICD-10-CM | POA: Diagnosis not present

## 2016-03-25 DIAGNOSIS — F329 Major depressive disorder, single episode, unspecified: Secondary | ICD-10-CM | POA: Insufficient documentation

## 2016-03-25 DIAGNOSIS — K641 Second degree hemorrhoids: Secondary | ICD-10-CM | POA: Insufficient documentation

## 2016-03-25 DIAGNOSIS — K2970 Gastritis, unspecified, without bleeding: Secondary | ICD-10-CM | POA: Diagnosis not present

## 2016-03-25 DIAGNOSIS — F419 Anxiety disorder, unspecified: Secondary | ICD-10-CM | POA: Insufficient documentation

## 2016-03-25 DIAGNOSIS — F418 Other specified anxiety disorders: Secondary | ICD-10-CM | POA: Diagnosis not present

## 2016-03-25 DIAGNOSIS — K5730 Diverticulosis of large intestine without perforation or abscess without bleeding: Secondary | ICD-10-CM | POA: Diagnosis not present

## 2016-03-25 DIAGNOSIS — K219 Gastro-esophageal reflux disease without esophagitis: Secondary | ICD-10-CM | POA: Diagnosis not present

## 2016-03-25 DIAGNOSIS — R1011 Right upper quadrant pain: Secondary | ICD-10-CM | POA: Diagnosis not present

## 2016-03-25 DIAGNOSIS — Z79899 Other long term (current) drug therapy: Secondary | ICD-10-CM | POA: Insufficient documentation

## 2016-03-25 DIAGNOSIS — K635 Polyp of colon: Secondary | ICD-10-CM | POA: Diagnosis not present

## 2016-03-25 DIAGNOSIS — E785 Hyperlipidemia, unspecified: Secondary | ICD-10-CM | POA: Insufficient documentation

## 2016-03-25 DIAGNOSIS — K5790 Diverticulosis of intestine, part unspecified, without perforation or abscess without bleeding: Secondary | ICD-10-CM | POA: Diagnosis not present

## 2016-03-25 DIAGNOSIS — D123 Benign neoplasm of transverse colon: Secondary | ICD-10-CM | POA: Insufficient documentation

## 2016-03-25 DIAGNOSIS — D144 Benign neoplasm of respiratory system, unspecified: Secondary | ICD-10-CM | POA: Insufficient documentation

## 2016-03-25 DIAGNOSIS — D124 Benign neoplasm of descending colon: Secondary | ICD-10-CM | POA: Insufficient documentation

## 2016-03-25 DIAGNOSIS — K449 Diaphragmatic hernia without obstruction or gangrene: Secondary | ICD-10-CM | POA: Insufficient documentation

## 2016-03-25 DIAGNOSIS — J449 Chronic obstructive pulmonary disease, unspecified: Secondary | ICD-10-CM | POA: Diagnosis not present

## 2016-03-25 DIAGNOSIS — G4730 Sleep apnea, unspecified: Secondary | ICD-10-CM | POA: Diagnosis not present

## 2016-03-25 DIAGNOSIS — R12 Heartburn: Secondary | ICD-10-CM | POA: Diagnosis not present

## 2016-03-25 HISTORY — PX: COLONOSCOPY WITH PROPOFOL: SHX5780

## 2016-03-25 HISTORY — PX: ESOPHAGOGASTRODUODENOSCOPY (EGD) WITH PROPOFOL: SHX5813

## 2016-03-25 HISTORY — DX: Chronic obstructive pulmonary disease, unspecified: J44.9

## 2016-03-25 SURGERY — COLONOSCOPY WITH PROPOFOL
Anesthesia: General

## 2016-03-25 MED ORDER — PROPOFOL 10 MG/ML IV BOLUS
INTRAVENOUS | Status: DC | PRN
  Administered 2016-03-25: 20 mg via INTRAVENOUS
  Administered 2016-03-25: 30 mg via INTRAVENOUS
  Administered 2016-03-25: 50 mg via INTRAVENOUS

## 2016-03-25 MED ORDER — PROPOFOL 500 MG/50ML IV EMUL
INTRAVENOUS | Status: DC | PRN
  Administered 2016-03-25: 160 ug/kg/min via INTRAVENOUS

## 2016-03-25 MED ORDER — GLYCOPYRROLATE 0.2 MG/ML IJ SOLN
INTRAMUSCULAR | Status: DC | PRN
  Administered 2016-03-25: 0.2 mg via INTRAVENOUS

## 2016-03-25 MED ORDER — MIDAZOLAM HCL 2 MG/2ML IJ SOLN
INTRAMUSCULAR | Status: DC | PRN
  Administered 2016-03-25: 2 mg via INTRAVENOUS

## 2016-03-25 MED ORDER — SODIUM CHLORIDE 0.9 % IV SOLN
INTRAVENOUS | Status: DC
  Administered 2016-03-25: 1000 mL via INTRAVENOUS

## 2016-03-25 MED ORDER — LIDOCAINE HCL (CARDIAC) 20 MG/ML IV SOLN
INTRAVENOUS | Status: DC | PRN
  Administered 2016-03-25: 100 mg via INTRAVENOUS

## 2016-03-25 NOTE — H&P (Signed)
[name], MD [facility] [address], [place], [place] [address] Phone: [phone] Fax : [phone]  Primary Care Physician:  Keith Rake, MD Primary Gastroenterologist:  Dr. Allen Norris  Pre-Procedure History & Physical: HPI:  Faith Padilla is a 65 y.o. female is here for an endoscopy and colonoscopy.   Past Medical History  Diagnosis Date  . Glaucoma   . HLD (hyperlipidemia)   . Diverticulitis   . Vaginal yeast infection   . Osteoarthritis     lumbar spine  . Acid reflux   . Microscopic hematuria   . Mixed incontinence   . Morbid obesity ([place])   . Anxiety and depression   . Asthma   . Kidney stones   . Sleep apnea     CPAP  . Wears dentures     partial upper  . Shortness of breath dyspnea   . Complication of anesthesia     slow to awaken, wakes coughing and SOB  . COPD (chronic obstructive pulmonary disease) ([place])   . Depression     Past Surgical History  Procedure Laterality Date  . Abdominal hysterectomy  1998  . Back surgery  1990/2014  . Cataract extraction, bilateral  2014  . Lithotripsy  2012  . Deviated nose septum surgery  2000  . Eye surgery      Prior to Admission medications   Medication Sig Start Date End Date Taking? Authorizing Provider  atorvastatin (LIPITOR) 20 MG tablet TAKE 1 TABLET(20 MG) BY MOUTH AT BEDTIME 03/07/16   Roselee Nova, MD  Azelastine-Fluticasone (DYMISTA NA) Place into the nose daily as needed.    Historical Provider, MD  CALCIUM PO Take by mouth.    Historical Provider, MD  cetirizine (ZYRTEC) 10 MG tablet TAKE 1 TABLET BY MOUTH AT BEDTIME 11/08/15   Adelina Mings, MD  ciprofloxacin (CIPRO) 500 MG tablet Take 1 tablet (500 mg total) by mouth 2 (two) times daily. Patient not taking: Reported on 03/17/2016 03/14/16   Roselee Nova, MD  Cyanocobalamin (VITAMIN B-12 PO) Take by mouth.    Historical Provider, MD  DULoxetine (CYMBALTA) 60 MG capsule Take 60 mg by mouth daily. Reported on 12/26/2015    Historical Provider,  MD  fluticasone (FLONASE) 50 MCG/ACT nasal spray INSTILL 1 TO 2 SPRAYS INTO EACH NOSTRIL 1 TO 2 TIMES DAILY AS NEEDED 12/11/15   Adelina Mings, MD  MAGNESIUM PO Take by mouth.    Historical Provider, MD  meloxicam (MOBIC) 15 MG tablet TAKE 1 TABLET(15 MG) BY MOUTH DAILY 03/07/16   Roselee Nova, MD  metroNIDAZOLE (FLAGYL) 500 MG tablet Take 1 tablet (500 mg total) by mouth 3 (three) times daily. Patient not taking: Reported on 03/17/2016 03/14/16   Roselee Nova, MD  montelukast (SINGULAIR) 10 MG tablet Take one daily in the evening to prevent cough or wheeze. 12/17/15   Charlies Silvers, MD  MULTIPLE VITAMIN PO Take by mouth daily.    Historical Provider, MD  nystatin-triamcinolone ointment (MYCOLOG) Apply 1 application topically 2 (two) times daily. 12/06/15   Rubie Maid, MD  omeprazole (PRILOSEC) 20 MG capsule Take 1 capsule (20 mg total) by mouth 2 (two) times daily before a meal. 12/05/15   Roselee Nova, MD  Oxycodone HCl 10 MG TABS Take 1 tablet (10 mg total) by mouth every 8 (eight) hours as needed. 03/12/16   Roselee Nova, MD  PATADAY 0.2 % SOLN Apply one drop in each  eye once daily as needed for itchy eyes. Patient not taking: Reported on 03/17/2016 12/17/15   Charlies Silvers, MD  PREMARIN vaginal cream Reported on 02/07/2016 01/01/15   Historical Provider, MD  PROVENTIL HFA 108 (90 Base) MCG/ACT inhaler INHALE2 PUFFS BY MOUTH EVERY 4 HOURS AS NEEDED TO PREVENT COUGH OR WHEEZE, MAY INHALE 2 PUFFS BY MOUTH 10 TO 20 MINUTES PRIOR EXERCISE 02/06/16   Charlies Silvers, MD  SYMBICORT 160-4.5 MCG/ACT inhaler INHALE 2 PUFFS BY MOUTH TWICE DAILY TO PREVENT COUGH OR WHEEZE. RINSE,GARGLE AND SPIT AFTER USE 02/06/16   Charlies Silvers, MD  traMADol (ULTRAM) 50 MG tablet Take 1 tablet (50 mg total) by mouth every 6 (six) hours as needed. Patient not taking: Reported on 03/17/2016 03/04/16 03/04/17  Harvest Dark, MD  Turmeric 500 MG CAPS Take by mouth. Reported on 03/17/2016    Historical Provider,  MD    Allergies as of 03/20/2016 - Review Complete 03/14/2016  Allergen Reaction Noted  . Morphine Hives 12/11/2014    Family History  Problem Relation Age of Onset  . Heart disease Mother   . Diabetes Mother   . Kidney disease Mother   . Cancer Father     lung cancer  . COPD Father   . Liver cancer Father   . Cancer Sister     Brain Cancer  . Allergic rhinitis Sister   . Asthma Sister   . Hematuria Daughter   . Bladder Cancer Neg Hx     Social History   Social History  . Marital Status: Single    Spouse Name: N/A  . Number of Children: N/A  . Years of Education: N/A   Occupational History  . Not on file.   Social History Main Topics  . Smoking status: Never Smoker   . Smokeless tobacco: Never Used  . Alcohol Use: No  . Drug Use: No  . Sexual Activity: No   Other Topics Concern  . Not on file   Social History Narrative    Review of Systems: See HPI, otherwise negative ROS  Physical Exam: BP 149/80 mmHg  Pulse 96  Temp(Src) 98.3 F (36.8 C) (Tympanic)  Resp 20  SpO2 100% General:   Alert,  pleasant and cooperative in NAD Head:  Normocephalic and atraumatic. Neck:  Supple; no masses or thyromegaly. Lungs:  Clear throughout to auscultation.    Heart:  Regular rate and rhythm. Abdomen:  Soft, nontender and nondistended. Normal bowel sounds, without guarding, and without rebound.   Neurologic:  Alert and  oriented x4;  grossly normal neurologically.  Impression/Plan: Faith Padilla is here for an endoscopy and colonoscopy to be performed for Dysphagia and history of polyps.  Risks, benefits, limitations, and alternatives regarding  endoscopy and colonoscopy have been reviewed with the patient.  Questions have been answered.  All parties agreeable.   [name], MD  03/25/2016, 7:50 AM

## 2016-03-25 NOTE — Anesthesia Preprocedure Evaluation (Signed)
Anesthesia Evaluation  Patient identified by MRN, date of birth, ID band Patient awake    Reviewed: Allergy & Precautions, H&P , NPO status , Patient's Chart, lab work & pertinent test results, reviewed documented beta blocker date and time   History of Anesthesia Complications (+) PROLONGED EMERGENCE and history of anesthetic complications  Airway Mallampati: III   Neck ROM: full    Dental  (+) Poor Dentition   Pulmonary neg pulmonary ROS, shortness of breath and with exertion, asthma , sleep apnea , COPD,  COPD inhaler,    Pulmonary exam normal        Cardiovascular negative cardio ROS Normal cardiovascular exam Rhythm:regular Rate:Normal     Neuro/Psych PSYCHIATRIC DISORDERS  Neuromuscular disease negative neurological ROS  negative psych ROS   GI/Hepatic negative GI ROS, Neg liver ROS, GERD  Medicated,  Endo/Other  negative endocrine ROSMorbid obesity  Renal/GU Renal diseasenegative Renal ROS  negative genitourinary   Musculoskeletal   Abdominal   Peds  Hematology negative hematology ROS (+)   Anesthesia Other Findings Past Medical History:   Glaucoma                                                     HLD (hyperlipidemia)                                         Diverticulitis                                               Vaginal yeast infection                                      Osteoarthritis                                                 Comment:lumbar spine   Acid reflux                                                  Microscopic hematuria                                        Mixed incontinence                                           Morbid obesity (HCC)                                         Anxiety  and depression                                       Asthma                                                       Kidney stones                                                Sleep apnea                                                     Comment:CPAP   Wears dentures                                                 Comment:partial upper   Shortness of breath dyspnea                                  Complication of anesthesia                                     Comment:slow to awaken, wakes coughing and SOB   COPD (chronic obstructive pulmonary disease) (*              Depression                                                 Past Surgical History:   ABDOMINAL HYSTERECTOMY                           1998         BACK SURGERY                                     1990/2014    CATARACT EXTRACTION, BILATERAL                   2014         LITHOTRIPSY                                      2012         deviated nose septum surgery                     2000  EYE SURGERY                                                   Reproductive/Obstetrics                             Anesthesia Physical Anesthesia Plan  ASA: III  Anesthesia Plan: General   Post-op Pain Management:    Induction:   Airway Management Planned:   Additional Equipment:   Intra-op Plan:   Post-operative Plan:   Informed Consent: I have reviewed the patients History and Physical, chart, labs and discussed the procedure including the risks, benefits and alternatives for the proposed anesthesia with the patient or authorized representative who has indicated his/her understanding and acceptance.   Dental Advisory Given  Plan Discussed with: CRNA  Anesthesia Plan Comments:         Anesthesia Quick Evaluation

## 2016-03-25 NOTE — Transfer of Care (Signed)
Immediate Anesthesia Transfer of Care Note  Patient: Faith Padilla  Procedure(s) Performed: Procedure(s): COLONOSCOPY WITH PROPOFOL (N/A) ESOPHAGOGASTRODUODENOSCOPY (EGD) WITH PROPOFOL (N/A)  Patient Location: PACU  Anesthesia Type:General  Level of Consciousness: awake and alert   Airway & Oxygen Therapy: Patient Spontanous Breathing and Patient connected to nasal cannula oxygen  Post-op Assessment: Report given to RN and Post -op Vital signs reviewed and stable  Post vital signs: Reviewed and stable  Last Vitals:  Filed Vitals:   03/25/16 0718  BP: 149/80  Pulse: 96  Temp: 36.8 C  Resp: 20    Last Pain: There were no vitals filed for this visit.       Complications: No apparent anesthesia complications

## 2016-03-25 NOTE — Op Note (Signed)
[facility] Patient Name: Faith Padilla Procedure Date: 03/25/2016 7:59 AM MRN: [id number] Account #: [id number] Date of Birth: [birth year] Admit Type: Outpatient Age: 65 Room: [facility] ENDO ROOM 4 Gender: Female Note Status: Finalized Procedure:            Upper GI endoscopy Indications:          Dysphagia, Heartburn Providers:            [name] MD, MD Referring MD:         Otila Back. Manuella Ghazi (Referring MD) Medicines:            Propofol per Anesthesia Complications:        No immediate complications. Procedure:            Pre-Anesthesia Assessment:                       - Prior to the procedure, a History and Physical was                        performed, and patient medications and allergies were                        reviewed. The patient's tolerance of previous                        anesthesia was also reviewed. The risks and benefits of                        the procedure and the sedation options and risks were                        discussed with the patient. All questions were                        answered, and informed consent was obtained. Prior                        Anticoagulants: The patient has taken no previous                        anticoagulant or antiplatelet agents. ASA Grade                        Assessment: II - A patient with mild systemic disease.                        After reviewing the risks and benefits, the patient was                        deemed in satisfactory condition to undergo the                        procedure.                       After obtaining informed consent, the endoscope was                        passed under direct vision. Throughout the procedure,  the patient's blood pressure, pulse, and oxygen                        saturations were monitored continuously. The Endoscope                        was introduced through the mouth, and advanced to the   second part of duodenum. The upper GI endoscopy was                        accomplished without difficulty. The patient tolerated                        the procedure well. Findings:      A 6 cm hiatal hernia was present.      Localized minimal inflammation characterized by erythema was found in       the gastric antrum. Biopsies were taken with a cold forceps for       histology.      The examined duodenum was normal. Impression:           - 6 cm hiatal hernia.                       - Gastritis. Biopsied.                       - Normal examined duodenum. Recommendation:       - Await pathology results.                       - Perform a colonoscopy today. Procedure Code(s):    --- Professional ---                       [phone], Esophagogastroduodenoscopy, flexible, transoral;                        with biopsy, single or multiple Diagnosis Code(s):    --- Professional ---                       R13.10, Dysphagia, unspecified                       R12, Heartburn                       K44.9, Diaphragmatic hernia without obstruction or                        gangrene                       K29.70, Gastritis, unspecified, without bleeding CPT copyright 2016 American Medical Association. All rights reserved. The codes documented in this report are preliminary and upon coder review may  be revised to meet current compliance requirements. [name] MD, MD 03/25/2016 8:09:59 AM This report has been signed electronically. Number of Addenda: 0 Note Initiated On: 03/25/2016 7:59 AM      [facility]

## 2016-03-25 NOTE — Op Note (Signed)
[facility] Patient Name: Faith Padilla Procedure Date: 03/25/2016 7:58 AM MRN: [id number] Account #: [id number] Date of Birth: 04-12-1951 Admit Type: Outpatient Age: 65 Room: [facility] ENDO ROOM 4 Gender: Female Note Status: Finalized Procedure:            Colonoscopy Indications:          Abdominal pain in the right upper quadrant Providers:            [name] MD, MD Referring MD:         Otila Back. Manuella Ghazi (Referring MD) Medicines:            Propofol per Anesthesia Complications:        No immediate complications. Procedure:            Pre-Anesthesia Assessment:                       - Prior to the procedure, a History and Physical was                        performed, and patient medications and allergies were                        reviewed. The patient's tolerance of previous                        anesthesia was also reviewed. The risks and benefits of                        the procedure and the sedation options and risks were                        discussed with the patient. All questions were                        answered, and informed consent was obtained. Prior                        Anticoagulants: The patient has taken no previous                        anticoagulant or antiplatelet agents. ASA Grade                        Assessment: III - A patient with severe systemic                        disease. After reviewing the risks and benefits, the                        patient was deemed in satisfactory condition to undergo                        the procedure.                       After obtaining informed consent, the colonoscope was                        passed under direct vision. Throughout the procedure,  the patient's blood pressure, pulse, and oxygen                        saturations were monitored continuously. The                        Colonoscope was introduced through the anus and       advanced to the the cecum, identified by appendiceal                        orifice and ileocecal valve. The colonoscopy was                        performed without difficulty. The patient tolerated the                        procedure well. The quality of the bowel preparation                        was excellent. Findings:      The perianal and digital rectal examinations were normal.      Six sessile polyps were found in the transverse colon. The polyps were 3       to 6 mm in size. These polyps were removed with a cold snare. Resection       and retrieval were complete. To prevent bleeding post-intervention, one       hemostatic clip was successfully placed (MR conditional). There was no       bleeding at the end of the procedure.      A 5 mm polyp was found in the descending colon. The polyp was sessile.       The polyp was removed with a cold snare. Resection and retrieval were       complete. To prevent bleeding post-intervention, one hemostatic clip was       successfully placed (MR conditional). There was no bleeding at the end       of the procedure.      Multiple small-mouthed diverticula were found in the sigmoid colon.      Non-bleeding internal hemorrhoids were found during retroflexion. The       hemorrhoids were Grade II (internal hemorrhoids that prolapse but reduce       spontaneously). Impression:           - Six 3 to 6 mm polyps in the transverse colon, removed                        with a cold snare. Resected and retrieved. Clip (MR                        conditional) was placed.                       - One 5 mm polyp in the descending colon, removed with                        a cold snare. Resected and retrieved. Clip (MR                        conditional) was placed.                       -  Diverticulosis in the sigmoid colon.                       - Non-bleeding internal hemorrhoids. Recommendation:       - Await pathology results.                       -  Repeat colonoscopy in 3 years for surveillance. Procedure Code(s):    --- Professional ---                       [phone], Colonoscopy, flexible; with removal of tumor(s),                        polyp(s), or other lesion(s) by snare technique Diagnosis Code(s):    --- Professional ---                       R10.11, Right upper quadrant pain                       D12.3, Benign neoplasm of transverse colon (hepatic                        flexure or splenic flexure)                       D12.4, Benign neoplasm of descending colon CPT copyright 2016 American Medical Association. All rights reserved. The codes documented in this report are preliminary and upon coder review may  be revised to meet current compliance requirements. [name] MD, MD 03/25/2016 8:30:13 AM This report has been signed electronically. Number of Addenda: 0 Note Initiated On: 03/25/2016 7:58 AM Scope Withdrawal Time: 0 hours 11 minutes 7 seconds  Total Procedure Duration: 0 hours 16 minutes 21 seconds       [facility]

## 2016-03-25 NOTE — Anesthesia Postprocedure Evaluation (Signed)
Anesthesia Post Note  Patient: Faith Padilla  Procedure(s) Performed: Procedure(s) (LRB): COLONOSCOPY WITH PROPOFOL (N/A) ESOPHAGOGASTRODUODENOSCOPY (EGD) WITH PROPOFOL (N/A)  Patient location during evaluation: PACU Anesthesia Type: General Level of consciousness: awake and alert Pain management: pain level controlled Vital Signs Assessment: post-procedure vital signs reviewed and stable Respiratory status: spontaneous breathing, nonlabored ventilation, respiratory function stable and patient connected to nasal cannula oxygen Cardiovascular status: blood pressure returned to baseline and stable Postop Assessment: no signs of nausea or vomiting Anesthetic complications: no    Last Vitals:  Filed Vitals:   03/25/16 0718 03/25/16 0833  BP: 149/80 113/84  Pulse: 96 109  Temp: 36.8 C 36.4 C  Resp: 20 26    Last Pain:  Filed Vitals:   03/25/16 0852  PainSc: 3                  Molli Barrows

## 2016-03-26 ENCOUNTER — Encounter: Payer: Self-pay | Admitting: Gastroenterology

## 2016-03-26 DIAGNOSIS — F331 Major depressive disorder, recurrent, moderate: Secondary | ICD-10-CM | POA: Diagnosis not present

## 2016-03-27 ENCOUNTER — Encounter: Payer: Self-pay | Admitting: Gastroenterology

## 2016-03-27 LAB — SURGICAL PATHOLOGY

## 2016-04-02 ENCOUNTER — Ambulatory Visit: Payer: Medicare Other | Admitting: Obstetrics and Gynecology

## 2016-04-07 ENCOUNTER — Other Ambulatory Visit: Payer: Self-pay | Admitting: Family Medicine

## 2016-04-07 ENCOUNTER — Other Ambulatory Visit: Payer: Self-pay | Admitting: Allergy and Immunology

## 2016-04-09 ENCOUNTER — Ambulatory Visit (INDEPENDENT_AMBULATORY_CARE_PROVIDER_SITE_OTHER): Payer: Medicare Other | Admitting: Allergy and Immunology

## 2016-04-09 ENCOUNTER — Encounter: Payer: Self-pay | Admitting: Allergy and Immunology

## 2016-04-09 VITALS — BP 130/80 | HR 64 | Temp 97.8°F | Resp 18

## 2016-04-09 DIAGNOSIS — J309 Allergic rhinitis, unspecified: Secondary | ICD-10-CM | POA: Diagnosis not present

## 2016-04-09 DIAGNOSIS — K219 Gastro-esophageal reflux disease without esophagitis: Secondary | ICD-10-CM | POA: Diagnosis not present

## 2016-04-09 DIAGNOSIS — H1010 Acute atopic conjunctivitis, unspecified eye: Secondary | ICD-10-CM

## 2016-04-09 DIAGNOSIS — J4541 Moderate persistent asthma with (acute) exacerbation: Secondary | ICD-10-CM

## 2016-04-09 MED ORDER — RANITIDINE HCL 300 MG PO TABS: 300.0000 mg | ORAL_TABLET | Freq: Every day | ORAL | 3 refills | Status: DC

## 2016-04-09 MED ORDER — METHYLPREDNISOLONE ACETATE 80 MG/ML IJ SUSP
80.0000 mg | Freq: Once | INTRAMUSCULAR | Status: AC
  Administered 2016-04-09: 80 mg via INTRAMUSCULAR

## 2016-04-09 NOTE — Progress Notes (Signed)
Follow-up Note  Referring Provider: Roselee Nova, MD Primary Provider: Keith Rake, MD Date of Office Visit: 04/09/2016  Subjective:   Faith Padilla (DOB: [birth year]) is a 65 y.o. female who returns to the [facility] on 04/09/2016 in re-evaluation of the following:  HPI: [name] presents to this clinic in reevaluation of her asthma and allergic rhinoconjunctivitis and reflux. She unfortunately developed a issue with sore throat and throat clearing and drainage and nasal congestion and sneezing and rhinorrhea and coughing and wheezing and using her bronchodilator several times per day over the course of the past week or so. She has not had any fever or ugly nasal discharge. She's had lots of posttussive emesis associated with this issue. There is definitely been contacts with a similar type of infection.    Medication List      atorvastatin 20 MG tablet Commonly known as:  LIPITOR TAKE 1 TABLET(20 MG) BY MOUTH AT BEDTIME   CALCIUM PO Take by mouth.   cetirizine 10 MG tablet Commonly known as:  ZYRTEC TAKE 1 TABLET BY MOUTH AT BEDTIME   ciprofloxacin 500 MG tablet Commonly known as:  CIPRO Take 1 tablet (500 mg total) by mouth 2 (two) times daily.   DULoxetine 60 MG capsule Commonly known as:  CYMBALTA Take 60 mg by mouth daily. Reported on 12/26/2015   DYMISTA NA Place into the nose daily as needed.   fluticasone 50 MCG/ACT nasal spray Commonly known as:  FLONASE SHAKE LIQUID AND USE 1 TO 2 SPRAYS IN EACH NOSTRIL 1 TO 2 TIMES DAILY AS NEEDED   MAGNESIUM PO Take by mouth.   meloxicam 15 MG tablet Commonly known as:  MOBIC TAKE 1 TABLET(15 MG) BY MOUTH DAILY   metroNIDAZOLE 500 MG tablet Commonly known as:  FLAGYL Take 1 tablet (500 mg total) by mouth 3 (three) times daily.   montelukast 10 MG tablet Commonly known as:  SINGULAIR Take one daily in the evening to prevent cough or wheeze.   MULTIPLE VITAMIN PO Take by mouth daily.     nystatin-triamcinolone ointment Commonly known as:  MYCOLOG Apply 1 application topically 2 (two) times daily.   omeprazole 20 MG capsule Commonly known as:  PRILOSEC Take 1 capsule (20 mg total) by mouth 2 (two) times daily before a meal.   Oxycodone HCl 10 MG Tabs Take 1 tablet (10 mg total) by mouth every 8 (eight) hours as needed.   PATADAY 0.2 % Soln Generic drug:  Olopatadine HCl Apply one drop in each eye once daily as needed for itchy eyes.   PREMARIN vaginal cream Generic drug:  conjugated estrogens Reported on 02/07/2016   PROVENTIL HFA 108 (90 Base) MCG/ACT inhaler Generic drug:  albuterol INHALE2 PUFFS BY MOUTH EVERY 4 HOURS AS NEEDED TO PREVENT COUGH OR WHEEZE, MAY INHALE 2 PUFFS BY MOUTH 10 TO 20 MINUTES PRIOR EXERCISE   SYMBICORT 160-4.5 MCG/ACT inhaler Generic drug:  budesonide-formoterol INHALE 2 PUFFS BY MOUTH TWICE DAILY TO PREVENT COUGH OR WHEEZE. RINSE,GARGLE AND SPIT AFTER USE   traMADol 50 MG tablet Commonly known as:  ULTRAM Take 1 tablet (50 mg total) by mouth every 6 (six) hours as needed.   Turmeric 500 MG Caps Take by mouth. Reported on 03/17/2016   VITAMIN B-12 PO Take by mouth.       Past Medical History:  Diagnosis Date  . Acid reflux   . Anxiety and depression   . Asthma   . Complication of  anesthesia    slow to awaken, wakes coughing and SOB  . COPD (chronic obstructive pulmonary disease) ([place])   . Depression   . Diverticulitis   . Glaucoma   . HLD (hyperlipidemia)   . Kidney stones   . Microscopic hematuria   . Mixed incontinence   . Morbid obesity ([place])   . Osteoarthritis    lumbar spine  . Shortness of breath dyspnea   . Sleep apnea    CPAP  . Vaginal yeast infection   . Wears dentures    partial upper    Past Surgical History:  Procedure Laterality Date  . ABDOMINAL HYSTERECTOMY  1998  . BACK SURGERY  1990/2014  . CATARACT EXTRACTION, BILATERAL  2014  . COLONOSCOPY WITH PROPOFOL N/A 03/25/2016   Procedure:  COLONOSCOPY WITH PROPOFOL;  Surgeon: [name], MD;  Location: [facility] ENDOSCOPY;  Service: Endoscopy;  Laterality: N/A;  . deviated nose septum surgery  2000  . ESOPHAGOGASTRODUODENOSCOPY (EGD) WITH PROPOFOL N/A 03/25/2016   Procedure: ESOPHAGOGASTRODUODENOSCOPY (EGD) WITH PROPOFOL;  Surgeon: [name], MD;  Location: [facility] ENDOSCOPY;  Service: Endoscopy;  Laterality: N/A;  . EYE SURGERY    . LITHOTRIPSY  2012    Allergies  Allergen Reactions  . Morphine Hives    Review of systems negative except as noted in HPI / PMHx or noted below:  Review of Systems  Constitutional: Negative.   HENT: Negative.   Eyes: Negative.   Respiratory: Negative.   Cardiovascular: Negative.   Gastrointestinal: Negative.   Genitourinary: Negative.   Musculoskeletal: Negative.   Skin: Negative.   Neurological: Negative.   Endo/Heme/Allergies: Negative.   Psychiatric/Behavioral: Negative.      Objective:   Vitals:   04/09/16 1115  BP: 130/80  Pulse: 64  Resp: 18  Temp: 97.8 F (36.6 C)          Physical Exam  Constitutional: She is well-developed, well-nourished, and in no distress.  Nasal voice, coughing  HENT:  Head: Normocephalic.  Right Ear: Tympanic membrane, external ear and ear canal normal.  Left Ear: Tympanic membrane, external ear and ear canal normal.  Nose: Mucosal edema (Erythematous) present. No rhinorrhea.  Mouth/Throat: Uvula is midline, oropharynx is clear and moist and mucous membranes are normal. No oropharyngeal exudate.  Eyes: Conjunctivae are normal.  Neck: Trachea normal. No tracheal tenderness present. No tracheal deviation present. No thyromegaly present.  Cardiovascular: Normal rate, regular rhythm, S1 normal, S2 normal and normal heart sounds.   No murmur heard. Pulmonary/Chest: Breath sounds normal. No stridor. No respiratory distress. She has no wheezes. She has no rales.  Musculoskeletal: She exhibits no edema.  Lymphadenopathy:       Head (right side):  No tonsillar adenopathy present.       Head (left side): No tonsillar adenopathy present.    She has no cervical adenopathy.  Neurological: She is alert. Gait normal.  Skin: No rash noted. She is not diaphoretic. No erythema. Nails show no clubbing.  Psychiatric: Mood and affect normal.    Diagnostics:    Spirometry was performed and demonstrated an FEV1 of 2.12 at 95 % of predicted.  The patient had an Asthma Control Test with the following results:  .    Assessment and Plan:   1. Asthma, not well controlled, moderate persistent, with acute exacerbation   2. Allergic rhinoconjunctivitis   3. Gastroesophageal reflux disease, esophagitis presence not specified     1. Depo-Medrol 80 IM delivered in clinic  2. OTC Mucinex DM 2  tablets twice a day while "sick"  3. Nasal saline multiple times per day while "sick"  4. Add ranitidine 300 mg once a day in the evening to omeprazole 20 mg twice a day while "sick"  5. Continue montelukast 10 mg tablet one tablet one time per day  6. Continue Flonase one spray each nostril one time per day  7. Continue Symbicort 160 - 2 inhalations twice a day  8. Continue Proventil HFA 2 puffs every 4-6 hours if needed  9. Obtain fall flu vaccine  10. Return to clinic December 2017 or earlier if problem  I will have Ziniyah utilize the therapy described above which includes a course of systemic steroids to address what appears to be a viral-induced respiratory tract infection with asthma flare. She should get over this issue over the course of the next several days and if not then she needs to contact me for further evaluation and treatment. Because she does have posttussive emesis she needs to be a little bit more aggressive about treating her reflux while she is sick by adding an H2 receptor blocker to her proton pump inhibitor. If she does well we'll see her back in this clinic in December or earlier if there is a problem.   Allena Katz, MD [place]

## 2016-04-09 NOTE — Patient Instructions (Addendum)
  1. Depo-Medrol 80 IM delivered in clinic  2. OTC Mucinex DM 2 tablets twice a day while "sick"  3. Nasal saline multiple times per day while "sick"  4. Add ranitidine 300 mg once a day in the evening to omeprazole 20 mg twice a day while "sick"  5. Continue montelukast 10 mg tablet one tablet one time per day  6. Continue Flonase one spray each nostril one time per day  7. Continue Symbicort 160 - 2 inhalations twice a day  8. Continue Proventil HFA 2 puffs every 4-6 hours if needed  9. Obtain fall flu vaccine  10. Return to clinic December 2017 or earlier if problem

## 2016-04-10 DIAGNOSIS — F411 Generalized anxiety disorder: Secondary | ICD-10-CM | POA: Diagnosis not present

## 2016-04-10 DIAGNOSIS — F332 Major depressive disorder, recurrent severe without psychotic features: Secondary | ICD-10-CM | POA: Diagnosis not present

## 2016-04-18 DIAGNOSIS — R1011 Right upper quadrant pain: Secondary | ICD-10-CM | POA: Diagnosis not present

## 2016-04-18 DIAGNOSIS — R109 Unspecified abdominal pain: Secondary | ICD-10-CM | POA: Diagnosis not present

## 2016-04-18 DIAGNOSIS — N200 Calculus of kidney: Secondary | ICD-10-CM | POA: Diagnosis not present

## 2016-04-18 DIAGNOSIS — N201 Calculus of ureter: Secondary | ICD-10-CM | POA: Diagnosis not present

## 2016-04-18 DIAGNOSIS — R449 Unspecified symptoms and signs involving general sensations and perceptions: Secondary | ICD-10-CM | POA: Diagnosis not present

## 2016-04-21 ENCOUNTER — Other Ambulatory Visit: Payer: Self-pay | Admitting: Family Medicine

## 2016-04-23 DIAGNOSIS — R109 Unspecified abdominal pain: Secondary | ICD-10-CM | POA: Diagnosis not present

## 2016-04-23 DIAGNOSIS — J45909 Unspecified asthma, uncomplicated: Secondary | ICD-10-CM | POA: Diagnosis not present

## 2016-04-23 DIAGNOSIS — M1990 Unspecified osteoarthritis, unspecified site: Secondary | ICD-10-CM | POA: Diagnosis not present

## 2016-04-23 DIAGNOSIS — M797 Fibromyalgia: Secondary | ICD-10-CM | POA: Diagnosis not present

## 2016-04-23 DIAGNOSIS — E785 Hyperlipidemia, unspecified: Secondary | ICD-10-CM | POA: Diagnosis not present

## 2016-04-23 DIAGNOSIS — R1031 Right lower quadrant pain: Secondary | ICD-10-CM | POA: Diagnosis not present

## 2016-04-23 DIAGNOSIS — N200 Calculus of kidney: Secondary | ICD-10-CM | POA: Diagnosis not present

## 2016-04-25 ENCOUNTER — Other Ambulatory Visit: Payer: Self-pay

## 2016-04-25 DIAGNOSIS — K219 Gastro-esophageal reflux disease without esophagitis: Secondary | ICD-10-CM | POA: Diagnosis not present

## 2016-04-25 DIAGNOSIS — R1011 Right upper quadrant pain: Secondary | ICD-10-CM | POA: Diagnosis not present

## 2016-04-25 DIAGNOSIS — R12 Heartburn: Secondary | ICD-10-CM | POA: Diagnosis not present

## 2016-04-25 DIAGNOSIS — K449 Diaphragmatic hernia without obstruction or gangrene: Secondary | ICD-10-CM | POA: Diagnosis not present

## 2016-05-01 DIAGNOSIS — R1011 Right upper quadrant pain: Secondary | ICD-10-CM | POA: Diagnosis not present

## 2016-05-14 DIAGNOSIS — K219 Gastro-esophageal reflux disease without esophagitis: Secondary | ICD-10-CM | POA: Diagnosis not present

## 2016-05-14 DIAGNOSIS — K449 Diaphragmatic hernia without obstruction or gangrene: Secondary | ICD-10-CM | POA: Diagnosis not present

## 2016-05-20 DIAGNOSIS — K219 Gastro-esophageal reflux disease without esophagitis: Secondary | ICD-10-CM | POA: Diagnosis not present

## 2016-05-27 DIAGNOSIS — J45998 Other asthma: Secondary | ICD-10-CM | POA: Diagnosis not present

## 2016-05-27 DIAGNOSIS — N951 Menopausal and female climacteric states: Secondary | ICD-10-CM | POA: Diagnosis not present

## 2016-05-27 DIAGNOSIS — E784 Other hyperlipidemia: Secondary | ICD-10-CM | POA: Diagnosis not present

## 2016-05-27 DIAGNOSIS — R7309 Other abnormal glucose: Secondary | ICD-10-CM | POA: Diagnosis not present

## 2016-05-27 DIAGNOSIS — I10 Essential (primary) hypertension: Secondary | ICD-10-CM | POA: Diagnosis not present

## 2016-05-27 DIAGNOSIS — Z23 Encounter for immunization: Secondary | ICD-10-CM | POA: Diagnosis not present

## 2016-05-27 DIAGNOSIS — Z6841 Body Mass Index (BMI) 40.0 and over, adult: Secondary | ICD-10-CM | POA: Diagnosis not present

## 2016-05-28 DIAGNOSIS — K449 Diaphragmatic hernia without obstruction or gangrene: Secondary | ICD-10-CM | POA: Diagnosis not present

## 2016-06-06 ENCOUNTER — Other Ambulatory Visit: Payer: Self-pay | Admitting: Family Medicine

## 2016-06-10 DIAGNOSIS — J4540 Moderate persistent asthma, uncomplicated: Secondary | ICD-10-CM | POA: Diagnosis not present

## 2016-06-10 DIAGNOSIS — Z825 Family history of asthma and other chronic lower respiratory diseases: Secondary | ICD-10-CM | POA: Diagnosis not present

## 2016-06-10 DIAGNOSIS — J302 Other seasonal allergic rhinitis: Secondary | ICD-10-CM | POA: Diagnosis not present

## 2016-06-10 DIAGNOSIS — J3081 Allergic rhinitis due to animal (cat) (dog) hair and dander: Secondary | ICD-10-CM | POA: Diagnosis not present

## 2016-06-10 DIAGNOSIS — K219 Gastro-esophageal reflux disease without esophagitis: Secondary | ICD-10-CM | POA: Diagnosis not present

## 2016-06-10 DIAGNOSIS — J301 Allergic rhinitis due to pollen: Secondary | ICD-10-CM | POA: Diagnosis not present

## 2016-06-12 DIAGNOSIS — R7303 Prediabetes: Secondary | ICD-10-CM | POA: Diagnosis not present

## 2016-06-12 DIAGNOSIS — Z885 Allergy status to narcotic agent status: Secondary | ICD-10-CM | POA: Diagnosis not present

## 2016-06-12 DIAGNOSIS — Z6841 Body Mass Index (BMI) 40.0 and over, adult: Secondary | ICD-10-CM | POA: Diagnosis not present

## 2016-06-12 DIAGNOSIS — K449 Diaphragmatic hernia without obstruction or gangrene: Secondary | ICD-10-CM | POA: Diagnosis not present

## 2016-06-12 DIAGNOSIS — J45909 Unspecified asthma, uncomplicated: Secondary | ICD-10-CM | POA: Diagnosis not present

## 2016-06-12 DIAGNOSIS — Z7951 Long term (current) use of inhaled steroids: Secondary | ICD-10-CM | POA: Diagnosis not present

## 2016-06-12 DIAGNOSIS — K219 Gastro-esophageal reflux disease without esophagitis: Secondary | ICD-10-CM | POA: Diagnosis not present

## 2016-06-12 DIAGNOSIS — G4733 Obstructive sleep apnea (adult) (pediatric): Secondary | ICD-10-CM | POA: Diagnosis not present

## 2016-06-12 DIAGNOSIS — E7800 Pure hypercholesterolemia, unspecified: Secondary | ICD-10-CM | POA: Diagnosis not present

## 2016-06-12 DIAGNOSIS — E785 Hyperlipidemia, unspecified: Secondary | ICD-10-CM | POA: Diagnosis not present

## 2016-06-12 DIAGNOSIS — Z79899 Other long term (current) drug therapy: Secondary | ICD-10-CM | POA: Diagnosis not present

## 2016-06-12 DIAGNOSIS — I10 Essential (primary) hypertension: Secondary | ICD-10-CM | POA: Diagnosis not present

## 2016-06-12 DIAGNOSIS — Z9989 Dependence on other enabling machines and devices: Secondary | ICD-10-CM | POA: Diagnosis not present

## 2016-06-13 DIAGNOSIS — R7303 Prediabetes: Secondary | ICD-10-CM | POA: Insufficient documentation

## 2016-06-16 DIAGNOSIS — R946 Abnormal results of thyroid function studies: Secondary | ICD-10-CM | POA: Diagnosis not present

## 2016-06-25 DIAGNOSIS — Z0000 Encounter for general adult medical examination without abnormal findings: Secondary | ICD-10-CM | POA: Diagnosis not present

## 2016-06-25 DIAGNOSIS — Z23 Encounter for immunization: Secondary | ICD-10-CM | POA: Diagnosis not present

## 2016-06-25 DIAGNOSIS — Z7189 Other specified counseling: Secondary | ICD-10-CM | POA: Diagnosis not present

## 2016-06-26 DIAGNOSIS — H40053 Ocular hypertension, bilateral: Secondary | ICD-10-CM | POA: Insufficient documentation

## 2016-06-26 DIAGNOSIS — Z961 Presence of intraocular lens: Secondary | ICD-10-CM | POA: Insufficient documentation

## 2016-06-26 DIAGNOSIS — H26493 Other secondary cataract, bilateral: Secondary | ICD-10-CM | POA: Diagnosis not present

## 2016-06-26 HISTORY — DX: Ocular hypertension, bilateral: H40.053

## 2016-06-26 HISTORY — DX: Presence of intraocular lens: Z96.1

## 2016-07-11 ENCOUNTER — Other Ambulatory Visit: Payer: Self-pay | Admitting: Pediatrics

## 2016-07-14 ENCOUNTER — Other Ambulatory Visit: Payer: Self-pay | Admitting: Family Medicine

## 2016-07-22 DIAGNOSIS — R5383 Other fatigue: Secondary | ICD-10-CM | POA: Diagnosis not present

## 2016-07-22 DIAGNOSIS — R6889 Other general symptoms and signs: Secondary | ICD-10-CM | POA: Diagnosis not present

## 2016-07-22 DIAGNOSIS — K219 Gastro-esophageal reflux disease without esophagitis: Secondary | ICD-10-CM | POA: Diagnosis not present

## 2016-07-22 DIAGNOSIS — R638 Other symptoms and signs concerning food and fluid intake: Secondary | ICD-10-CM | POA: Diagnosis not present

## 2016-07-22 DIAGNOSIS — R5381 Other malaise: Secondary | ICD-10-CM | POA: Diagnosis not present

## 2016-07-22 DIAGNOSIS — Z5181 Encounter for therapeutic drug level monitoring: Secondary | ICD-10-CM | POA: Diagnosis not present

## 2016-07-22 DIAGNOSIS — E785 Hyperlipidemia, unspecified: Secondary | ICD-10-CM | POA: Diagnosis not present

## 2016-07-22 DIAGNOSIS — Z01812 Encounter for preprocedural laboratory examination: Secondary | ICD-10-CM | POA: Diagnosis not present

## 2016-07-28 DIAGNOSIS — M71572 Other bursitis, not elsewhere classified, left ankle and foot: Secondary | ICD-10-CM | POA: Diagnosis not present

## 2016-07-28 DIAGNOSIS — M150 Primary generalized (osteo)arthritis: Secondary | ICD-10-CM | POA: Diagnosis not present

## 2016-08-08 ENCOUNTER — Other Ambulatory Visit: Payer: Self-pay | Admitting: Pediatrics

## 2016-08-08 ENCOUNTER — Other Ambulatory Visit: Payer: Self-pay | Admitting: Allergy and Immunology

## 2016-08-08 DIAGNOSIS — J4540 Moderate persistent asthma, uncomplicated: Secondary | ICD-10-CM

## 2016-08-08 DIAGNOSIS — J0100 Acute maxillary sinusitis, unspecified: Secondary | ICD-10-CM

## 2016-08-19 DIAGNOSIS — R7309 Other abnormal glucose: Secondary | ICD-10-CM | POA: Diagnosis not present

## 2016-08-19 DIAGNOSIS — I10 Essential (primary) hypertension: Secondary | ICD-10-CM | POA: Diagnosis not present

## 2016-08-19 DIAGNOSIS — E784 Other hyperlipidemia: Secondary | ICD-10-CM | POA: Diagnosis not present

## 2016-09-05 ENCOUNTER — Other Ambulatory Visit: Payer: Self-pay | Admitting: Allergy and Immunology

## 2016-09-05 ENCOUNTER — Other Ambulatory Visit: Payer: Self-pay

## 2016-09-05 DIAGNOSIS — J0100 Acute maxillary sinusitis, unspecified: Secondary | ICD-10-CM

## 2016-09-05 MED ORDER — CETIRIZINE HCL 10 MG PO TABS: 10.0000 mg | ORAL_TABLET | Freq: Every day | ORAL | 3 refills | Status: DC

## 2016-09-06 DIAGNOSIS — J0140 Acute pansinusitis, unspecified: Secondary | ICD-10-CM | POA: Diagnosis not present

## 2016-09-06 DIAGNOSIS — J040 Acute laryngitis: Secondary | ICD-10-CM | POA: Diagnosis not present

## 2016-09-09 ENCOUNTER — Other Ambulatory Visit: Payer: Self-pay

## 2016-09-09 MED ORDER — FLUTICASONE PROPIONATE 50 MCG/ACT NA SUSP: NASAL | 0 refills | Status: DC

## 2016-09-09 NOTE — Telephone Encounter (Signed)
Fax request for fluticasone. Pt last seen in 04-2016. Will send okay for 1 fill and no more until Ov.

## 2016-09-10 DIAGNOSIS — R8290 Unspecified abnormal findings in urine: Secondary | ICD-10-CM | POA: Diagnosis not present

## 2016-09-10 DIAGNOSIS — Z7189 Other specified counseling: Secondary | ICD-10-CM | POA: Diagnosis not present

## 2016-09-10 DIAGNOSIS — E784 Other hyperlipidemia: Secondary | ICD-10-CM | POA: Diagnosis not present

## 2016-09-10 DIAGNOSIS — R8299 Other abnormal findings in urine: Secondary | ICD-10-CM | POA: Diagnosis not present

## 2016-09-10 DIAGNOSIS — J209 Acute bronchitis, unspecified: Secondary | ICD-10-CM | POA: Diagnosis not present

## 2016-09-10 DIAGNOSIS — I10 Essential (primary) hypertension: Secondary | ICD-10-CM | POA: Diagnosis not present

## 2016-09-18 DIAGNOSIS — J4542 Moderate persistent asthma with status asthmaticus: Secondary | ICD-10-CM | POA: Diagnosis not present

## 2016-09-18 DIAGNOSIS — J209 Acute bronchitis, unspecified: Secondary | ICD-10-CM | POA: Diagnosis not present

## 2016-09-18 DIAGNOSIS — J069 Acute upper respiratory infection, unspecified: Secondary | ICD-10-CM | POA: Diagnosis not present

## 2016-09-18 DIAGNOSIS — J0191 Acute recurrent sinusitis, unspecified: Secondary | ICD-10-CM | POA: Diagnosis not present

## 2016-09-18 DIAGNOSIS — J302 Other seasonal allergic rhinitis: Secondary | ICD-10-CM | POA: Diagnosis not present

## 2016-09-18 DIAGNOSIS — K219 Gastro-esophageal reflux disease without esophagitis: Secondary | ICD-10-CM | POA: Diagnosis not present

## 2016-09-18 DIAGNOSIS — J301 Allergic rhinitis due to pollen: Secondary | ICD-10-CM | POA: Diagnosis not present

## 2016-09-18 DIAGNOSIS — J3081 Allergic rhinitis due to animal (cat) (dog) hair and dander: Secondary | ICD-10-CM | POA: Diagnosis not present

## 2016-10-13 DIAGNOSIS — M25572 Pain in left ankle and joints of left foot: Secondary | ICD-10-CM | POA: Diagnosis not present

## 2016-11-03 ENCOUNTER — Other Ambulatory Visit: Payer: Self-pay | Admitting: Allergy and Immunology

## 2016-11-03 DIAGNOSIS — J4540 Moderate persistent asthma, uncomplicated: Secondary | ICD-10-CM

## 2016-11-03 DIAGNOSIS — J0100 Acute maxillary sinusitis, unspecified: Secondary | ICD-10-CM

## 2016-12-01 ENCOUNTER — Other Ambulatory Visit: Payer: Self-pay | Admitting: Allergy and Immunology

## 2016-12-01 DIAGNOSIS — J4540 Moderate persistent asthma, uncomplicated: Secondary | ICD-10-CM

## 2016-12-01 DIAGNOSIS — J0100 Acute maxillary sinusitis, unspecified: Secondary | ICD-10-CM

## 2016-12-10 DIAGNOSIS — Z6841 Body Mass Index (BMI) 40.0 and over, adult: Secondary | ICD-10-CM | POA: Diagnosis not present

## 2016-12-10 DIAGNOSIS — M79672 Pain in left foot: Secondary | ICD-10-CM | POA: Diagnosis not present

## 2016-12-29 ENCOUNTER — Other Ambulatory Visit: Payer: Self-pay | Admitting: Allergy and Immunology

## 2016-12-29 DIAGNOSIS — J4540 Moderate persistent asthma, uncomplicated: Secondary | ICD-10-CM

## 2016-12-31 DIAGNOSIS — G609 Hereditary and idiopathic neuropathy, unspecified: Secondary | ICD-10-CM | POA: Diagnosis not present

## 2016-12-31 DIAGNOSIS — M7662 Achilles tendinitis, left leg: Secondary | ICD-10-CM | POA: Diagnosis not present

## 2017-01-01 ENCOUNTER — Other Ambulatory Visit: Payer: Self-pay | Admitting: Allergy and Immunology

## 2017-01-01 DIAGNOSIS — J4540 Moderate persistent asthma, uncomplicated: Secondary | ICD-10-CM

## 2017-01-21 DIAGNOSIS — M7662 Achilles tendinitis, left leg: Secondary | ICD-10-CM | POA: Diagnosis not present

## 2017-03-09 DIAGNOSIS — M7662 Achilles tendinitis, left leg: Secondary | ICD-10-CM | POA: Diagnosis not present

## 2017-03-13 DIAGNOSIS — E784 Other hyperlipidemia: Secondary | ICD-10-CM | POA: Diagnosis not present

## 2017-03-13 DIAGNOSIS — I10 Essential (primary) hypertension: Secondary | ICD-10-CM | POA: Diagnosis not present

## 2017-03-18 DIAGNOSIS — M7662 Achilles tendinitis, left leg: Secondary | ICD-10-CM | POA: Diagnosis not present

## 2017-03-18 DIAGNOSIS — Z7189 Other specified counseling: Secondary | ICD-10-CM | POA: Diagnosis not present

## 2017-03-18 DIAGNOSIS — E784 Other hyperlipidemia: Secondary | ICD-10-CM | POA: Diagnosis not present

## 2017-03-18 DIAGNOSIS — I10 Essential (primary) hypertension: Secondary | ICD-10-CM | POA: Diagnosis not present

## 2017-04-23 DIAGNOSIS — E872 Acidosis: Secondary | ICD-10-CM | POA: Diagnosis not present

## 2017-04-23 DIAGNOSIS — N200 Calculus of kidney: Secondary | ICD-10-CM | POA: Diagnosis not present

## 2017-04-23 DIAGNOSIS — Z79899 Other long term (current) drug therapy: Secondary | ICD-10-CM | POA: Diagnosis not present

## 2017-04-23 DIAGNOSIS — A419 Sepsis, unspecified organism: Secondary | ICD-10-CM | POA: Diagnosis not present

## 2017-04-23 DIAGNOSIS — Z961 Presence of intraocular lens: Secondary | ICD-10-CM | POA: Diagnosis present

## 2017-04-23 DIAGNOSIS — D72829 Elevated white blood cell count, unspecified: Secondary | ICD-10-CM | POA: Diagnosis not present

## 2017-04-23 DIAGNOSIS — Z6841 Body Mass Index (BMI) 40.0 and over, adult: Secondary | ICD-10-CM | POA: Diagnosis not present

## 2017-04-23 DIAGNOSIS — R000 Tachycardia, unspecified: Secondary | ICD-10-CM | POA: Diagnosis not present

## 2017-04-23 DIAGNOSIS — N159 Renal tubulo-interstitial disease, unspecified: Secondary | ICD-10-CM | POA: Diagnosis not present

## 2017-04-23 DIAGNOSIS — Z885 Allergy status to narcotic agent status: Secondary | ICD-10-CM | POA: Diagnosis not present

## 2017-04-23 DIAGNOSIS — K449 Diaphragmatic hernia without obstruction or gangrene: Secondary | ICD-10-CM | POA: Diagnosis present

## 2017-04-23 DIAGNOSIS — R1310 Dysphagia, unspecified: Secondary | ICD-10-CM | POA: Diagnosis not present

## 2017-04-23 DIAGNOSIS — R319 Hematuria, unspecified: Secondary | ICD-10-CM | POA: Diagnosis not present

## 2017-04-23 DIAGNOSIS — R509 Fever, unspecified: Secondary | ICD-10-CM | POA: Diagnosis not present

## 2017-04-23 DIAGNOSIS — R1110 Vomiting, unspecified: Secondary | ICD-10-CM | POA: Diagnosis not present

## 2017-04-23 DIAGNOSIS — E785 Hyperlipidemia, unspecified: Secondary | ICD-10-CM | POA: Diagnosis not present

## 2017-04-23 DIAGNOSIS — R1319 Other dysphagia: Secondary | ICD-10-CM | POA: Diagnosis present

## 2017-04-23 DIAGNOSIS — J45909 Unspecified asthma, uncomplicated: Secondary | ICD-10-CM | POA: Diagnosis present

## 2017-04-23 DIAGNOSIS — K219 Gastro-esophageal reflux disease without esophagitis: Secondary | ICD-10-CM | POA: Diagnosis present

## 2017-04-23 DIAGNOSIS — Z87442 Personal history of urinary calculi: Secondary | ICD-10-CM | POA: Diagnosis not present

## 2017-04-23 DIAGNOSIS — N390 Urinary tract infection, site not specified: Secondary | ICD-10-CM | POA: Diagnosis not present

## 2017-04-23 DIAGNOSIS — H524 Presbyopia: Secondary | ICD-10-CM | POA: Diagnosis present

## 2017-04-23 DIAGNOSIS — K5790 Diverticulosis of intestine, part unspecified, without perforation or abscess without bleeding: Secondary | ICD-10-CM | POA: Diagnosis present

## 2017-04-23 DIAGNOSIS — B9689 Other specified bacterial agents as the cause of diseases classified elsewhere: Secondary | ICD-10-CM | POA: Diagnosis not present

## 2017-04-23 DIAGNOSIS — F17290 Nicotine dependence, other tobacco product, uncomplicated: Secondary | ICD-10-CM | POA: Diagnosis present

## 2017-04-23 DIAGNOSIS — H401130 Primary open-angle glaucoma, bilateral, stage unspecified: Secondary | ICD-10-CM | POA: Diagnosis present

## 2017-04-23 DIAGNOSIS — K429 Umbilical hernia without obstruction or gangrene: Secondary | ICD-10-CM | POA: Diagnosis present

## 2017-04-23 DIAGNOSIS — K760 Fatty (change of) liver, not elsewhere classified: Secondary | ICD-10-CM | POA: Diagnosis present

## 2017-05-06 DIAGNOSIS — Z6841 Body Mass Index (BMI) 40.0 and over, adult: Secondary | ICD-10-CM | POA: Diagnosis not present

## 2017-05-06 DIAGNOSIS — N390 Urinary tract infection, site not specified: Secondary | ICD-10-CM | POA: Diagnosis not present

## 2017-05-06 DIAGNOSIS — L989 Disorder of the skin and subcutaneous tissue, unspecified: Secondary | ICD-10-CM | POA: Diagnosis not present

## 2017-06-02 DIAGNOSIS — M797 Fibromyalgia: Secondary | ICD-10-CM | POA: Diagnosis not present

## 2017-06-02 DIAGNOSIS — K5732 Diverticulitis of large intestine without perforation or abscess without bleeding: Secondary | ICD-10-CM | POA: Diagnosis not present

## 2017-06-02 DIAGNOSIS — M545 Low back pain: Secondary | ICD-10-CM | POA: Diagnosis not present

## 2017-06-02 DIAGNOSIS — E7800 Pure hypercholesterolemia, unspecified: Secondary | ICD-10-CM | POA: Diagnosis not present

## 2017-06-02 DIAGNOSIS — M1990 Unspecified osteoarthritis, unspecified site: Secondary | ICD-10-CM | POA: Diagnosis not present

## 2017-06-02 DIAGNOSIS — Z79899 Other long term (current) drug therapy: Secondary | ICD-10-CM | POA: Diagnosis not present

## 2017-06-02 DIAGNOSIS — R1030 Lower abdominal pain, unspecified: Secondary | ICD-10-CM | POA: Diagnosis not present

## 2017-06-02 DIAGNOSIS — J45909 Unspecified asthma, uncomplicated: Secondary | ICD-10-CM | POA: Diagnosis not present

## 2017-06-02 DIAGNOSIS — Z90710 Acquired absence of both cervix and uterus: Secondary | ICD-10-CM | POA: Diagnosis not present

## 2017-06-02 DIAGNOSIS — N200 Calculus of kidney: Secondary | ICD-10-CM | POA: Diagnosis not present

## 2017-06-02 DIAGNOSIS — R102 Pelvic and perineal pain: Secondary | ICD-10-CM | POA: Diagnosis not present

## 2017-06-02 DIAGNOSIS — K219 Gastro-esophageal reflux disease without esophagitis: Secondary | ICD-10-CM | POA: Diagnosis not present

## 2017-06-02 DIAGNOSIS — F329 Major depressive disorder, single episode, unspecified: Secondary | ICD-10-CM | POA: Diagnosis not present

## 2017-06-10 DIAGNOSIS — R7309 Other abnormal glucose: Secondary | ICD-10-CM | POA: Diagnosis not present

## 2017-06-10 DIAGNOSIS — Z131 Encounter for screening for diabetes mellitus: Secondary | ICD-10-CM | POA: Diagnosis not present

## 2017-06-10 DIAGNOSIS — R109 Unspecified abdominal pain: Secondary | ICD-10-CM | POA: Diagnosis not present

## 2017-06-10 DIAGNOSIS — I10 Essential (primary) hypertension: Secondary | ICD-10-CM | POA: Diagnosis not present

## 2017-06-17 DIAGNOSIS — L308 Other specified dermatitis: Secondary | ICD-10-CM | POA: Diagnosis not present

## 2017-06-17 DIAGNOSIS — E7849 Other hyperlipidemia: Secondary | ICD-10-CM | POA: Diagnosis not present

## 2017-06-17 DIAGNOSIS — Z6841 Body Mass Index (BMI) 40.0 and over, adult: Secondary | ICD-10-CM | POA: Diagnosis not present

## 2017-06-17 DIAGNOSIS — I10 Essential (primary) hypertension: Secondary | ICD-10-CM | POA: Diagnosis not present

## 2017-06-17 DIAGNOSIS — Z23 Encounter for immunization: Secondary | ICD-10-CM | POA: Diagnosis not present

## 2017-06-29 DIAGNOSIS — Z23 Encounter for immunization: Secondary | ICD-10-CM | POA: Diagnosis not present

## 2017-06-29 DIAGNOSIS — Z961 Presence of intraocular lens: Secondary | ICD-10-CM | POA: Diagnosis not present

## 2017-06-29 DIAGNOSIS — H26493 Other secondary cataract, bilateral: Secondary | ICD-10-CM | POA: Diagnosis not present

## 2017-06-29 DIAGNOSIS — H40053 Ocular hypertension, bilateral: Secondary | ICD-10-CM | POA: Diagnosis not present

## 2017-06-29 DIAGNOSIS — H1013 Acute atopic conjunctivitis, bilateral: Secondary | ICD-10-CM

## 2017-06-29 DIAGNOSIS — Z0000 Encounter for general adult medical examination without abnormal findings: Secondary | ICD-10-CM | POA: Diagnosis not present

## 2017-06-29 HISTORY — DX: Acute atopic conjunctivitis, bilateral: H10.13

## 2017-07-16 IMAGING — CT CT ABD-PELV W/ CM
2 of 5 series · 17 of 46 positions shown, 19 images · IV contrast (isovue)
Comparison: CT dated 07/24/2014

CLINICAL DATA: 64-year-old female with left lower quadrant
abdominal pain. History of prior diverticulitis.

EXAM:
CT ABDOMEN AND PELVIS WITH CONTRAST
TECHNIQUE: Multidetector CT imaging of the abdomen and pelvis was performed
using the standard protocol following bolus administration of
intravenous contrast.
CONTRAST:  100 cc Isovue 370

[Series 2: routine abd pel with · axial · 0.98mm/px · z∈[-522,-102]mm · 14 of 94 slices shown, 16 images]
[im 5/94  soft-tissue]
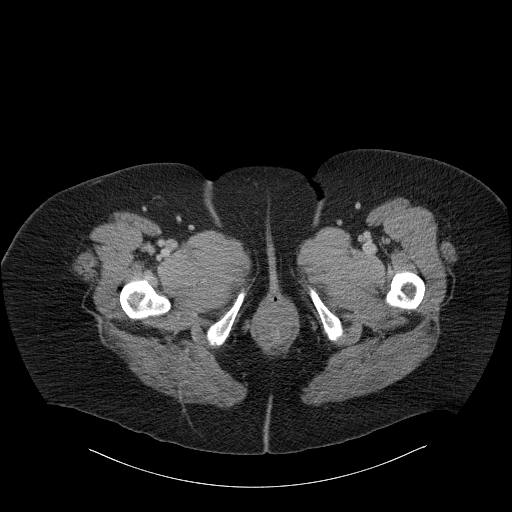
[im 5/94  bone]
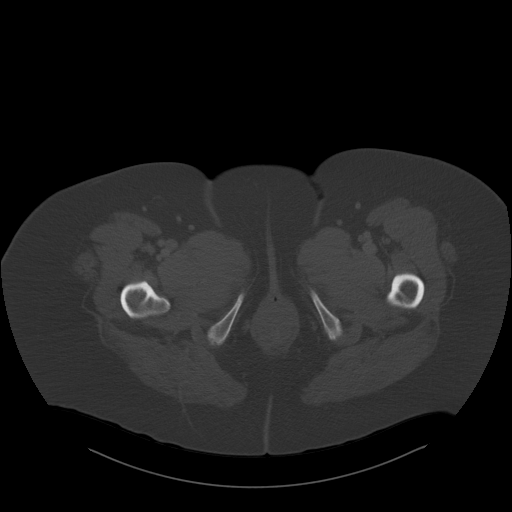
[im 10/94  soft-tissue]
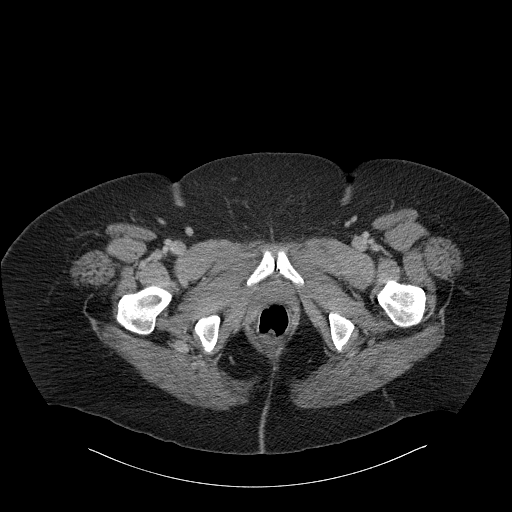
[im 20/94  soft-tissue]
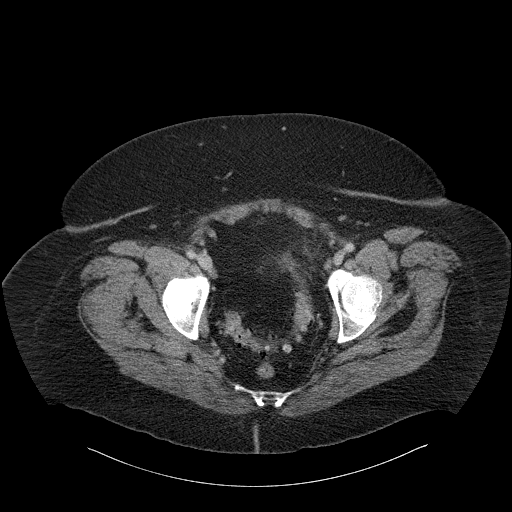
[im 25/94  soft-tissue]
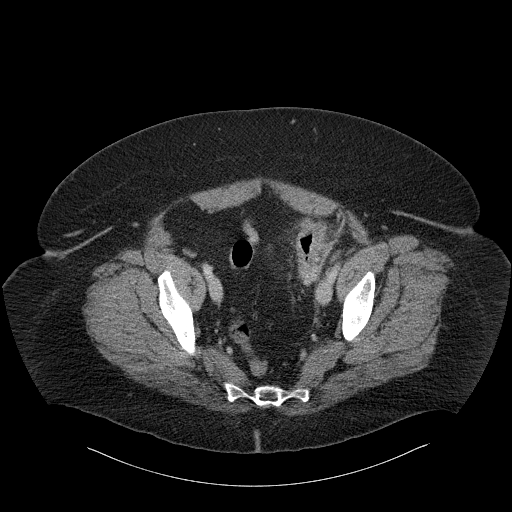
[im 30/94  soft-tissue]
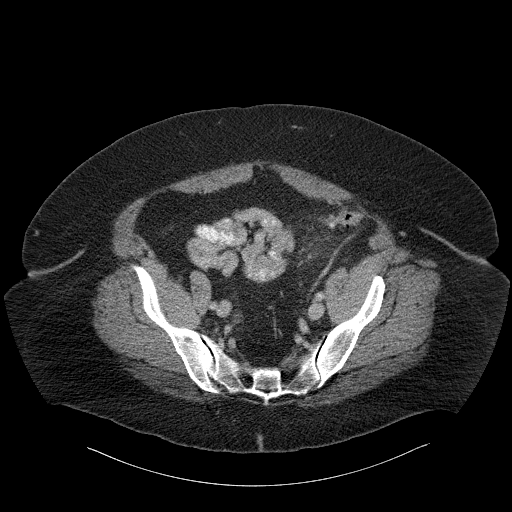
[im 40/94  soft-tissue]
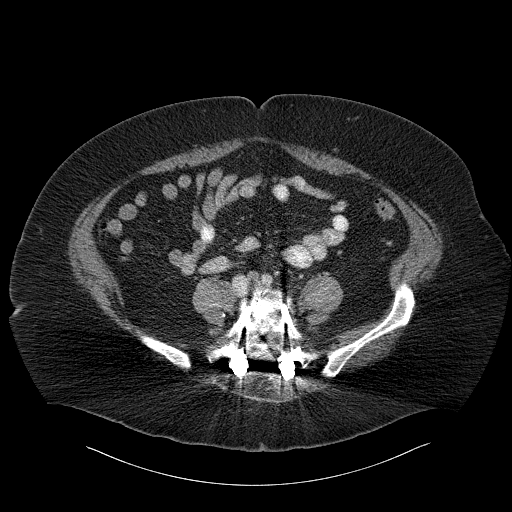
[im 45/94  soft-tissue]
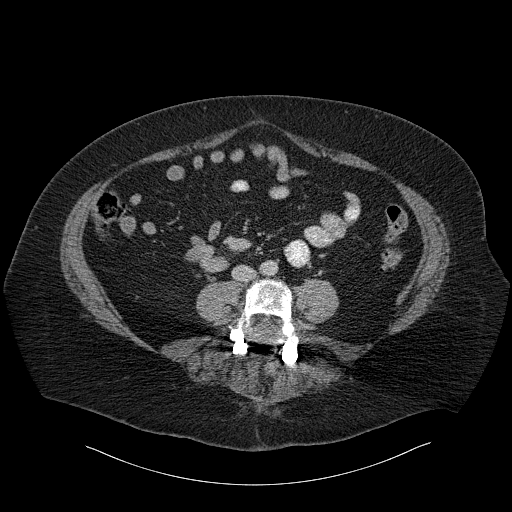
[im 49/94  soft-tissue]
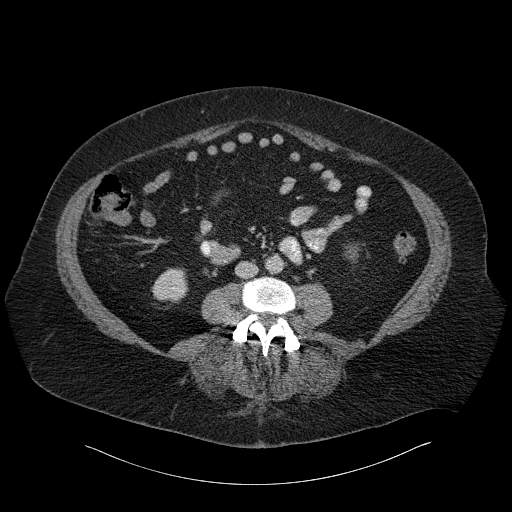
[im 54/94  soft-tissue]
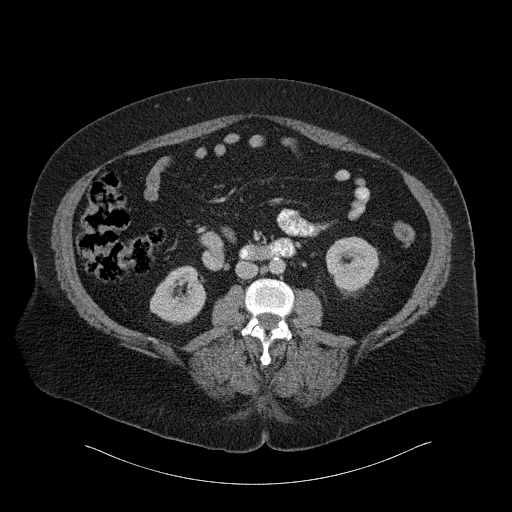
[im 54/94  bone]
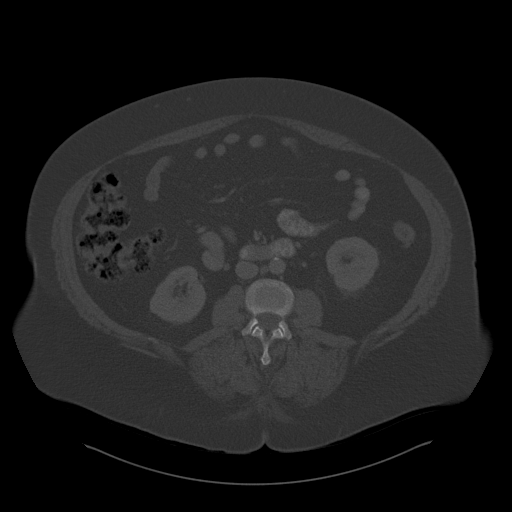
[im 64/94  soft-tissue]
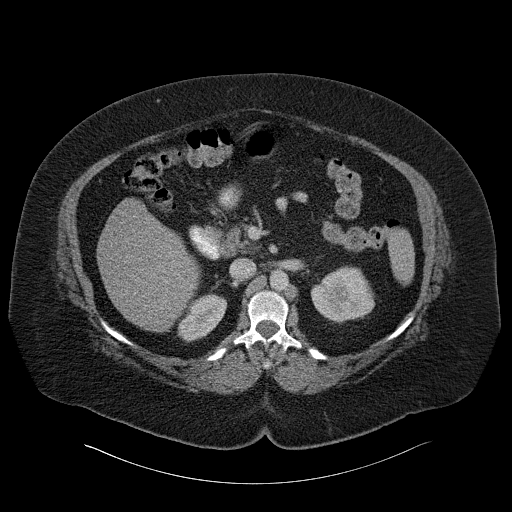
[im 69/94  soft-tissue]
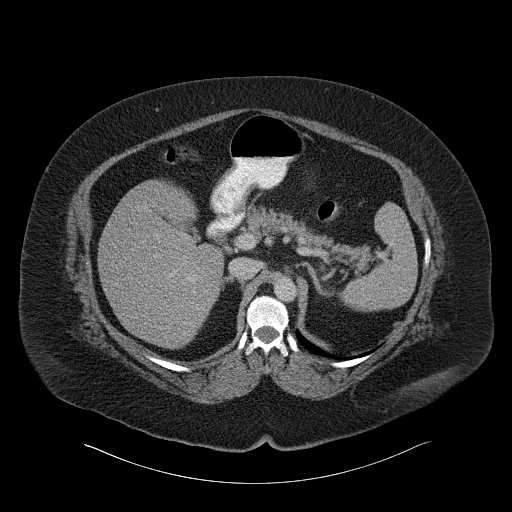
[im 74/94  soft-tissue]
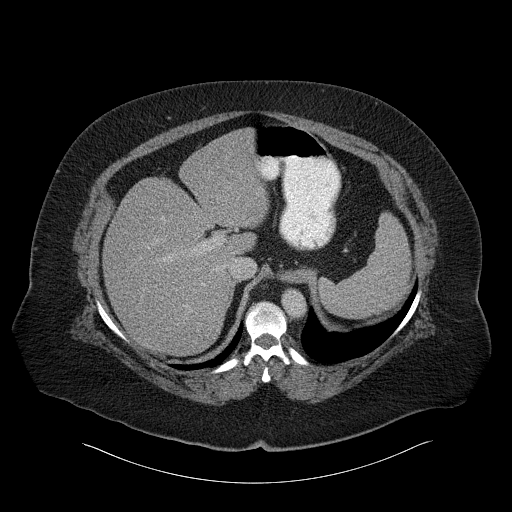
[im 84/94  soft-tissue]
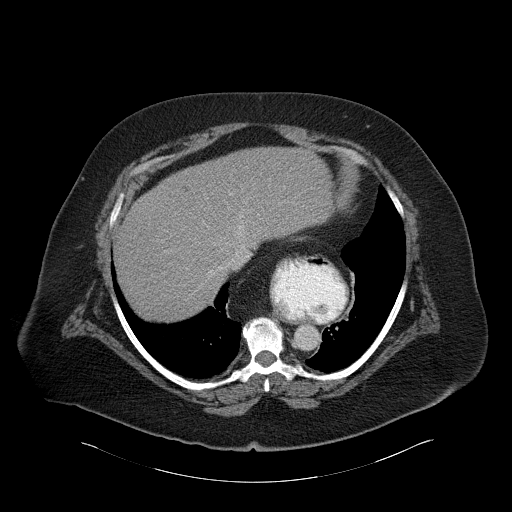
[im 89/94  soft-tissue]
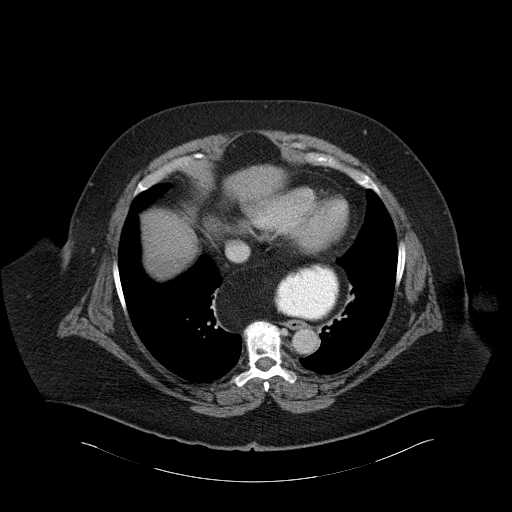

[Series 5: cor routine abd pel with · coronal · 0.92mm/px · 3 of 183 slices shown]
[im 61/183  soft-tissue]
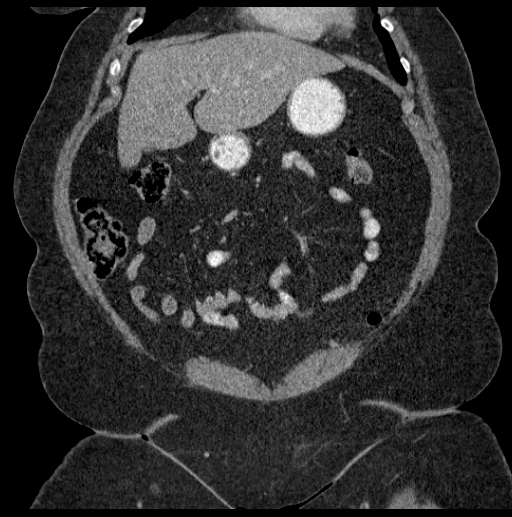
[im 81/183  soft-tissue]
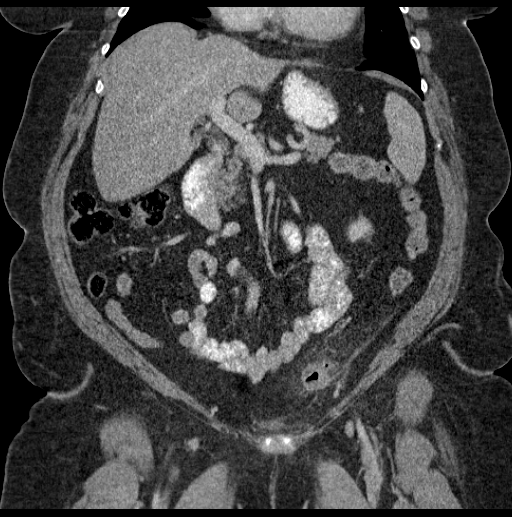
[im 102/183  soft-tissue]
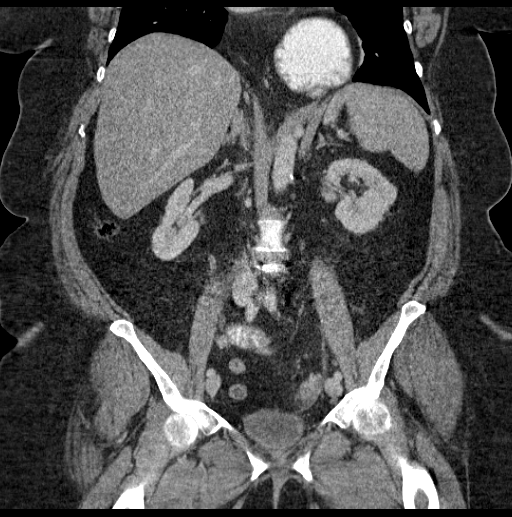

[17 of 46 positions shown; findings below may reference images not displayed]

FINDINGS: The visualized lung bases are clear. No intra-abdominal free air or
free fluid.

Apparent diffuse fatty infiltration of the liver. The gallbladder,
pancreas, spleen, adrenal glands, appear unremarkable. There is a 4
mm nonobstructing right renal inferior pole calculus. A 3 mm
nonobstructing left renal interpolar calculus may also be present.
There is no hydronephrosis on either side. There is stable appearing
irregularity of the renal cortices most prominent on the right which
may be related to prior infection and infarct. The visualized
ureters and urinary bladder appear unremarkable. Hysterectomy.

There is extensive sigmoid diverticulosis. There is segmental
inflammatory changes of the sigmoid colon compatible with acute
diverticulitis. There is no diverticular abscess or evidence of
perforation. There is a stable moderate size hiatal hernia. No
evidence of bowel obstruction. Normal appendix.

The abdominal aorta and IVC appear unremarkable. No portal venous
gas identified. There is no adenopathy. There is a small fat
containing umbilical hernia. The abdominal wall soft tissues are
otherwise unremarkable. There is lower lumbar laminectomy with
posterior fixation hardware at L4-S1. L4-L5 disc spacer noted. There
is grade 1 L5-S1 anterolisthesis. No acute fracture.
IMPRESSION: Sigmoid diverticulitis.  No abscess.

Stable moderate size hiatal hernia.

## 2017-07-24 DIAGNOSIS — J3081 Allergic rhinitis due to animal (cat) (dog) hair and dander: Secondary | ICD-10-CM | POA: Diagnosis not present

## 2017-07-24 DIAGNOSIS — G4733 Obstructive sleep apnea (adult) (pediatric): Secondary | ICD-10-CM | POA: Diagnosis not present

## 2017-07-24 DIAGNOSIS — K219 Gastro-esophageal reflux disease without esophagitis: Secondary | ICD-10-CM | POA: Diagnosis not present

## 2017-07-24 DIAGNOSIS — J302 Other seasonal allergic rhinitis: Secondary | ICD-10-CM | POA: Diagnosis not present

## 2017-07-24 DIAGNOSIS — J4540 Moderate persistent asthma, uncomplicated: Secondary | ICD-10-CM | POA: Diagnosis not present

## 2017-07-24 DIAGNOSIS — J301 Allergic rhinitis due to pollen: Secondary | ICD-10-CM | POA: Diagnosis not present

## 2018-08-02 DIAGNOSIS — G43109 Migraine with aura, not intractable, without status migrainosus: Secondary | ICD-10-CM | POA: Insufficient documentation

## 2018-08-02 DIAGNOSIS — Z79899 Other long term (current) drug therapy: Secondary | ICD-10-CM | POA: Insufficient documentation

## 2018-08-02 HISTORY — DX: Migraine with aura, not intractable, without status migrainosus: G43.109

## 2018-08-02 HISTORY — DX: Other long term (current) drug therapy: Z79.899

## 2018-09-08 DIAGNOSIS — J8410 Pulmonary fibrosis, unspecified: Secondary | ICD-10-CM

## 2018-09-08 HISTORY — DX: Pulmonary fibrosis, unspecified: J84.10

## 2019-11-23 LAB — HM COLONOSCOPY

## 2019-12-04 DIAGNOSIS — D649 Anemia, unspecified: Secondary | ICD-10-CM

## 2019-12-04 DIAGNOSIS — E611 Iron deficiency: Secondary | ICD-10-CM | POA: Insufficient documentation

## 2019-12-04 HISTORY — DX: Iron deficiency: E61.1

## 2019-12-04 HISTORY — DX: Anemia, unspecified: D64.9

## 2020-03-30 DIAGNOSIS — Z09 Encounter for follow-up examination after completed treatment for conditions other than malignant neoplasm: Secondary | ICD-10-CM

## 2020-03-30 HISTORY — DX: Encounter for follow-up examination after completed treatment for conditions other than malignant neoplasm: Z09

## 2021-02-25 LAB — HM COLONOSCOPY

## 2021-08-13 DIAGNOSIS — R002 Palpitations: Secondary | ICD-10-CM

## 2021-08-13 DIAGNOSIS — G834 Cauda equina syndrome: Secondary | ICD-10-CM

## 2021-08-13 DIAGNOSIS — R0609 Other forms of dyspnea: Secondary | ICD-10-CM

## 2021-08-13 DIAGNOSIS — H04123 Dry eye syndrome of bilateral lacrimal glands: Secondary | ICD-10-CM | POA: Insufficient documentation

## 2021-08-13 HISTORY — DX: Cauda equina syndrome: G83.4

## 2021-08-13 HISTORY — DX: Palpitations: R00.2

## 2021-08-13 HISTORY — DX: Other forms of dyspnea: R06.09

## 2021-08-13 HISTORY — DX: Dry eye syndrome of bilateral lacrimal glands: H04.123

## 2021-08-15 DIAGNOSIS — F0632 Mood disorder due to known physiological condition with major depressive-like episode: Secondary | ICD-10-CM | POA: Insufficient documentation

## 2021-08-15 DIAGNOSIS — F411 Generalized anxiety disorder: Secondary | ICD-10-CM

## 2021-08-15 HISTORY — DX: Mood disorder due to known physiological condition with major depressive-like episode: F06.32

## 2021-08-15 HISTORY — DX: Generalized anxiety disorder: F41.1

## 2021-10-17 DIAGNOSIS — K625 Hemorrhage of anus and rectum: Secondary | ICD-10-CM | POA: Insufficient documentation

## 2021-10-17 HISTORY — DX: Hemorrhage of anus and rectum: K62.5

## 2021-11-15 DIAGNOSIS — Z818 Family history of other mental and behavioral disorders: Secondary | ICD-10-CM | POA: Insufficient documentation

## 2021-11-15 HISTORY — DX: Family history of other mental and behavioral disorders: Z81.8

## 2021-12-07 DIAGNOSIS — U099 Post covid-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, unspecified: Secondary | ICD-10-CM

## 2021-12-07 DIAGNOSIS — Z604 Social exclusion and rejection: Secondary | ICD-10-CM

## 2021-12-07 HISTORY — DX: Post covid-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, unspecified: U09.9

## 2021-12-07 HISTORY — DX: Social exclusion and rejection: Z60.4

## 2022-06-25 DIAGNOSIS — Z87898 Personal history of other specified conditions: Secondary | ICD-10-CM | POA: Insufficient documentation

## 2022-06-25 DIAGNOSIS — R350 Frequency of micturition: Secondary | ICD-10-CM

## 2022-06-25 DIAGNOSIS — R3589 Other polyuria: Secondary | ICD-10-CM | POA: Insufficient documentation

## 2022-06-25 HISTORY — DX: Personal history of other specified conditions: Z87.898

## 2022-06-25 HISTORY — DX: Frequency of micturition: R35.0

## 2023-05-07 ENCOUNTER — Ambulatory Visit (INDEPENDENT_AMBULATORY_CARE_PROVIDER_SITE_OTHER): Payer: 59 | Admitting: Family Medicine

## 2023-05-07 ENCOUNTER — Encounter (HOSPITAL_BASED_OUTPATIENT_CLINIC_OR_DEPARTMENT_OTHER): Payer: Self-pay | Admitting: Family Medicine

## 2023-05-07 VITALS — BP 120/86 | HR 88 | Ht 61.0 in | Wt 220.1 lb

## 2023-05-07 DIAGNOSIS — E039 Hypothyroidism, unspecified: Secondary | ICD-10-CM

## 2023-05-07 DIAGNOSIS — R739 Hyperglycemia, unspecified: Secondary | ICD-10-CM

## 2023-05-07 DIAGNOSIS — E538 Deficiency of other specified B group vitamins: Secondary | ICD-10-CM

## 2023-05-07 DIAGNOSIS — J4520 Mild intermittent asthma, uncomplicated: Secondary | ICD-10-CM | POA: Diagnosis not present

## 2023-05-07 DIAGNOSIS — D508 Other iron deficiency anemias: Secondary | ICD-10-CM | POA: Diagnosis not present

## 2023-05-07 DIAGNOSIS — R159 Full incontinence of feces: Secondary | ICD-10-CM

## 2023-05-07 DIAGNOSIS — E559 Vitamin D deficiency, unspecified: Secondary | ICD-10-CM

## 2023-05-07 DIAGNOSIS — J8410 Pulmonary fibrosis, unspecified: Secondary | ICD-10-CM

## 2023-05-07 DIAGNOSIS — M79674 Pain in right toe(s): Secondary | ICD-10-CM

## 2023-05-07 DIAGNOSIS — Z7689 Persons encountering health services in other specified circumstances: Secondary | ICD-10-CM

## 2023-05-07 DIAGNOSIS — E782 Mixed hyperlipidemia: Secondary | ICD-10-CM

## 2023-05-07 MED ORDER — LEVOTHYROXINE SODIUM 75 MCG PO TABS: 75.0000 ug | ORAL_TABLET | Freq: Every day | ORAL | 3 refills | Status: DC

## 2023-05-07 MED ORDER — DULOXETINE HCL 60 MG PO CPEP: 60.0000 mg | ORAL_CAPSULE | Freq: Every day | ORAL | 3 refills | Status: DC

## 2023-05-07 MED ORDER — NYSTATIN 100000 UNIT/GM EX POWD: 1.0000 | Freq: Three times a day (TID) | CUTANEOUS | 3 refills | Status: AC

## 2023-05-07 MED ORDER — SOLIFENACIN SUCCINATE 5 MG PO TABS: 5.0000 mg | ORAL_TABLET | Freq: Every day | ORAL | 2 refills | Status: DC

## 2023-05-07 NOTE — Progress Notes (Signed)
New Patient Office Visit  Subjective    Patient ID: Faith Padilla, female    DOB: 02/27/1951  Age: 72 y.o. MRN: [id number]  CC:  Chief Complaint  Patient presents with   New Patient (Initial Visit)    Pt is here today to get established with the practice. States she has some toes that are bothering her. Also states she needs refills on some meds.    HPI Faith Padilla is a 72 year-old presents to establish care. She presents to the visit with her daughter.  She moved here recently from [place], [place].   She has concerns about her R foot- 2nd and 3rd toe-- this has been going on for about a year. First noticed on her 3rd toe and now noticed 2nd.   Orthopedics- left hip replacement  Allergist-  Hematology- anemia, needs infusions Q3 months- has not needed any infusions for the past 6 months  Pulmonology- pulmonary fibrosis d/t COVID in 2021?, asthma  Has been having a lot of diarrhea- difficult to control BMs  Urinary incontinence- has urologist, botox was not helpful   CPAP- has supplies  Reports no sleep specialist is following her machine  Outpatient Encounter Medications as of 05/07/2023  Medication Sig   albuterol (VENTOLIN HFA) 108 (90 Base) MCG/ACT inhaler Inhale into the lungs.   allopurinol (ZYLOPRIM) 100 MG tablet Take 1 tablet by mouth daily.   atorvastatin (LIPITOR) 20 MG tablet TAKE 1 TABLET(20 MG) BY MOUTH AT BEDTIME   Biotin 1 MG CAPS Take 1 capsule by mouth daily.   CALCIUM PO Take by mouth.   cetirizine (ZYRTEC) 10 MG tablet Take 1 tablet (10 mg total) by mouth at bedtime.   Cyanocobalamin (VITAMIN B-12 PO) Take by mouth.   gabapentin (NEURONTIN) 300 MG capsule Take 300 mg by mouth 3 (three) times daily.   hydrOXYzine (ATARAX) 50 MG tablet Take by mouth.   latanoprost (XALATAN) 0.005 % ophthalmic solution 1 drop at bedtime.   MAGNESIUM PO Take by mouth.   MELATONIN PO Take by mouth as needed (sleep).   methocarbamol (ROBAXIN) 500 MG tablet Take 1 tablet by  mouth every 6 (six) hours as needed.   montelukast (SINGULAIR) 10 MG tablet TAKE 1 TABLET BY MOUTH EVERY EVENING TO PREVENT COUGH OR WHEEZING   nystatin (MYCOSTATIN/NYSTOP) powder Apply 1 Application topically 3 (three) times daily.   omeprazole (PRILOSEC) 20 MG capsule TAKE 1 CAPSULE(20 MG) BY MOUTH TWICE DAILY BEFORE A MEAL   Oxycodone HCl 10 MG TABS Take 1 tablet (10 mg total) by mouth every 8 (eight) hours as needed.   Probiotic Product (PROBIOTIC PO) Take by mouth.   solifenacin (VESICARE) 5 MG tablet Take 1 tablet (5 mg total) by mouth daily.   topiramate (TOPAMAX) 50 MG tablet Take by mouth.   Vitamin D, Ergocalciferol, (DRISDOL) 1.25 MG (50000 UNIT) CAPS capsule Take by mouth.   [DISCONTINUED] DULoxetine (CYMBALTA) 60 MG capsule Take 60 mg by mouth daily. Reported on 12/26/2015   [DISCONTINUED] levothyroxine (SYNTHROID) 75 MCG tablet Take 75 mcg by mouth daily before breakfast.   DULoxetine (CYMBALTA) 60 MG capsule Take 1 capsule (60 mg total) by mouth daily. Reported on 12/26/2015   levothyroxine (SYNTHROID) 75 MCG tablet Take 1 tablet (75 mcg total) by mouth daily before breakfast.   olopatadine (PATANOL) 0.1 % ophthalmic solution INSTILL 1 DROP IN BOTH EYES EVERY DAY AS NEEDED FOR ITCHY EYES   [DISCONTINUED] Azelastine-Fluticasone (DYMISTA NA) Place into the nose daily as needed.   [  DISCONTINUED] ciprofloxacin (CIPRO) 500 MG tablet Take 1 tablet (500 mg total) by mouth 2 (two) times daily.   [DISCONTINUED] fluticasone (FLONASE) 50 MCG/ACT nasal spray SHAKE LIQUID AND USE 1 TO 2 SPRAYS IN EACH NOSTRIL 1 TO 2 TIMES DAILY AS NEEDED   [DISCONTINUED] meloxicam (MOBIC) 15 MG tablet TAKE 1 TABLET(15 MG) BY MOUTH DAILY   [DISCONTINUED] metroNIDAZOLE (FLAGYL) 500 MG tablet Take 1 tablet (500 mg total) by mouth 3 (three) times daily. (Patient not taking: Reported on 04/09/2016)   [DISCONTINUED] MULTIPLE VITAMIN PO Take by mouth daily.   [DISCONTINUED] nystatin-triamcinolone ointment (MYCOLOG) Apply  1 application topically 2 (two) times daily. (Patient not taking: Reported on 04/09/2016)   [DISCONTINUED] omeprazole (PRILOSEC) 20 MG capsule TAKE 1 CAPSULE(20 MG) BY MOUTH TWICE DAILY BEFORE A MEAL   [DISCONTINUED] PATADAY 0.2 % SOLN Apply one drop in each eye once daily as needed for itchy eyes. (Patient not taking: Reported on 04/09/2016)   [DISCONTINUED] PATADAY 0.2 % SOLN INSTILL 1 DROP IN [facility] EYE EVERY DAY AS NEEDED FOR ITCHY EYES   [DISCONTINUED] PREMARIN vaginal cream Reported on 02/07/2016   [DISCONTINUED] PROVENTIL HFA 108 (90 Base) MCG/ACT inhaler INHALE2 PUFFS BY MOUTH EVERY 4 HOURS AS NEEDED TO PREVENT COUGH OR WHEEZE, MAY INHALE 2 PUFFS BY MOUTH 10 TO 20 MINUTES PRIOR EXERCISE   [DISCONTINUED] ranitidine (ZANTAC) 300 MG tablet TAKE 1 TABLET(300 MG) BY MOUTH AT BEDTIME   [DISCONTINUED] SYMBICORT 160-4.5 MCG/ACT inhaler INHALE 2 PUFFS BY MOUTH TWICE DAILY TO PREVENT COUGH OR WHEEZE; RINSE, GARGLE, THEN SPIT AFTER USE   [DISCONTINUED] Turmeric 500 MG CAPS Take by mouth. Reported on 03/17/2016   [DISCONTINUED] predniSONE (DELTASONE) tablet 10 mg    No facility-administered encounter medications on file as of 05/07/2023.    Past Medical History:  Diagnosis Date   Acid reflux    Anxiety and depression    Asthma    Complication of anesthesia    slow to awaken, wakes coughing and SOB   COPD (chronic obstructive pulmonary disease) (HCC)    Depression    Diverticulitis    Glaucoma    HLD (hyperlipidemia)    Kidney stones    Microscopic hematuria    Mixed incontinence    Morbid obesity (HCC)    Osteoarthritis    lumbar spine   Shortness of breath dyspnea    Sleep apnea    CPAP   Vaginal yeast infection    Wears dentures    partial upper    Past Surgical History:  Procedure Laterality Date   ABDOMINAL HYSTERECTOMY  1998   BACK SURGERY  1990/2014   CATARACT EXTRACTION, BILATERAL  2014   COLONOSCOPY WITH PROPOFOL N/A 03/25/2016   Procedure: COLONOSCOPY WITH PROPOFOL;  Surgeon:  Midge Minium, MD;  Location: [facility] ENDOSCOPY;  Service: Endoscopy;  Laterality: N/A;   deviated nose septum surgery  2000   ESOPHAGOGASTRODUODENOSCOPY (EGD) WITH PROPOFOL N/A 03/25/2016   Procedure: ESOPHAGOGASTRODUODENOSCOPY (EGD) WITH PROPOFOL;  Surgeon: Midge Minium, MD;  Location: [facility] ENDOSCOPY;  Service: Endoscopy;  Laterality: N/A;   EYE SURGERY     LITHOTRIPSY  2012    Family History  Problem Relation Age of Onset   Heart disease Mother    Diabetes Mother    Kidney disease Mother    Cancer Father        lung cancer   COPD Father    Liver cancer Father    Cancer Sister        Brain Cancer   Allergic  rhinitis Sister    Asthma Sister    Hematuria Daughter    Bladder Cancer Neg Hx      ROS      Objective    BP 120/86 (BP Location: Left Arm, Patient Position: Sitting, Cuff Size: Normal)   Pulse 88   Ht 5\' 1"  (1.549 m)   Wt 220 lb 1.6 oz (99.8 kg)   SpO2 100%   BMI 41.59 kg/m   Physical Exam      Assessment & Plan:   1. Encounter to establish care Patient is a pleasant 72 year-old female patient who presents to establish care with new primary care provider. Reviewed the past medical history, family history, social history, surgical history, medications and allergies today- updates made as indicated.   2. Pulmonary fibrosis (HCC) Patient has been diagnosed with pulmonary fibrosis as a result of having the COVID-19 infection. Denies difficulty breathing, increased work of breathing, shortness of breat with exertion, wheezing. She does have an albuterol inhaler but has not needed to use it recently. Referral placed to pulmonology.  - Ambulatory referral to Pulmonology  3. Pain of toe of right foot Patient reports that she first noticed that her 3rd toe on her R foot started to become reddened and irritated. She reports that now her 2nd toe is red and tender. She reports this has been going on for about a year. Denies trauma/injury, recent wound, difficulty walking.  Area appears reddened. Will place podiatry referral.   - Ambulatory referral to Podiatry  4. Iron deficiency anemia secondary to inadequate dietary iron intake Patient reports she has a history of IDA. Will assess CBC today and add iron studies if necessary.  - CBC with Differential/Platelet  5. Mild intermittent asthma without complication Patient has a history of well-controlled mild intermittent asthma. She would like referral to asthma & allergy specialist.  - Ambulatory referral to Allergy  6. Vitamin D deficiency Patient has history- will check vitamin D level today.  - VITAMIN D 25 Hydroxy (Vit-D Deficiency, Fractures)  7. Vitamin B 12 deficiency Patient has a history of vitamin B12 deficiency. Will check her level today.  - Vitamin B12  8. Mixed hyperlipidemia Patient has a history of elevated lipids in the past. She is currently taking atorvastatin 20mg  at bedtime. Will assess lipid panel today to determine if this is the right dose for her.  - Lipid panel  9. Hypothyroidism, unspecified type Patient has history of hypothyroidism. Currently taking levothyroxine 75 mcg daily as prescribed. Will assess thyroid function today.  - TSH Rfx on Abnormal to Free T4  10. Hyperglycemia Patient has had a history of hyperglycemia. Will check CMP and A1c.  - Comprehensive metabolic panel - Hemoglobin A1c  11. Incontinence of feces, unspecified fecal incontinence type Patient reports she has been having difficulty with bowel urgency/incontinence. She reports that she is not experiencing diarrhea all of the time. Denies melena, blood in stool, abdominal pain, N/V, coffee-ground emesis. She would like referral to GI specialist.  - Ambulatory referral to Gastroenterology   Return in about 2 months (around 07/07/2023) for chronic conditions .   Alyson Reedy, FNP

## 2023-05-08 LAB — CBC WITH DIFFERENTIAL/PLATELET
Basophils Absolute: 0.1 10*3/uL (ref 0.0–0.2)
Basos: 1 %
EOS (ABSOLUTE): 0.2 10*3/uL (ref 0.0–0.4)
Eos: 2 %
Hematocrit: 39 % (ref 34.0–46.6)
Hemoglobin: 12.6 g/dL (ref 11.1–15.9)
Immature Grans (Abs): 0 10*3/uL (ref 0.0–0.1)
Immature Granulocytes: 0 %
Lymphocytes Absolute: 2.8 10*3/uL (ref 0.7–3.1)
Lymphs: 22 %
MCH: 28.3 pg (ref 26.6–33.0)
MCHC: 32.3 g/dL (ref 31.5–35.7)
MCV: 87 fL (ref 79–97)
Monocytes Absolute: 1.4 10*3/uL — ABNORMAL HIGH (ref 0.1–0.9)
Monocytes: 11 %
Neutrophils Absolute: 8.1 10*3/uL — ABNORMAL HIGH (ref 1.4–7.0)
Neutrophils: 64 %
Platelets: 576 10*3/uL — ABNORMAL HIGH (ref 150–450)
RBC: 4.46 x10E6/uL (ref 3.77–5.28)
RDW: 14.2 % (ref 11.7–15.4)
WBC: 12.7 10*3/uL — ABNORMAL HIGH (ref 3.4–10.8)

## 2023-05-08 LAB — HEMOGLOBIN A1C
Est. average glucose Bld gHb Est-mCnc: 123 mg/dL
Hgb A1c MFr Bld: 5.9 % — ABNORMAL HIGH (ref 4.8–5.6)

## 2023-05-08 LAB — COMPREHENSIVE METABOLIC PANEL
ALT: 9 IU/L (ref 0–32)
AST: 15 IU/L (ref 0–40)
Albumin: 4.1 g/dL (ref 3.8–4.8)
Alkaline Phosphatase: 119 IU/L (ref 44–121)
BUN/Creatinine Ratio: 15 (ref 12–28)
BUN: 15 mg/dL (ref 8–27)
Bilirubin Total: 0.3 mg/dL (ref 0.0–1.2)
CO2: 24 mmol/L (ref 20–29)
Calcium: 10.4 mg/dL — ABNORMAL HIGH (ref 8.7–10.3)
Chloride: 106 mmol/L (ref 96–106)
Creatinine, Ser: 0.97 mg/dL (ref 0.57–1.00)
Globulin, Total: 2.8 g/dL (ref 1.5–4.5)
Glucose: 91 mg/dL (ref 70–99)
Potassium: 5.1 mmol/L (ref 3.5–5.2)
Sodium: 143 mmol/L (ref 134–144)
Total Protein: 6.9 g/dL (ref 6.0–8.5)
eGFR: 62 mL/min/{1.73_m2} (ref >59)

## 2023-05-08 LAB — VITAMIN D 25 HYDROXY (VIT D DEFICIENCY, FRACTURES): Vit D, 25-Hydroxy: 66.8 ng/mL (ref 30.0–100.0)

## 2023-05-08 LAB — LIPID PANEL
Chol/HDL Ratio: 3.5 ratio (ref 0.0–4.4)
Cholesterol, Total: 149 mg/dL (ref 100–199)
HDL: 43 mg/dL (ref >39)
LDL Chol Calc (NIH): 81 mg/dL (ref 0–99)
Triglycerides: 143 mg/dL (ref 0–149)
VLDL Cholesterol Cal: 25 mg/dL (ref 5–40)

## 2023-05-08 LAB — TSH RFX ON ABNORMAL TO FREE T4: TSH: 2.36 u[IU]/mL (ref 0.450–4.500)

## 2023-05-08 LAB — VITAMIN B12: Vitamin B-12: >2000 pg/mL — ABNORMAL HIGH (ref 232–1245)

## 2023-05-18 ENCOUNTER — Other Ambulatory Visit (HOSPITAL_BASED_OUTPATIENT_CLINIC_OR_DEPARTMENT_OTHER): Payer: Self-pay | Admitting: Family Medicine

## 2023-05-20 ENCOUNTER — Encounter (HOSPITAL_BASED_OUTPATIENT_CLINIC_OR_DEPARTMENT_OTHER): Payer: Self-pay

## 2023-05-28 ENCOUNTER — Ambulatory Visit (INDEPENDENT_AMBULATORY_CARE_PROVIDER_SITE_OTHER): Payer: 59 | Admitting: Podiatry

## 2023-05-28 DIAGNOSIS — M7751 Other enthesopathy of right foot: Secondary | ICD-10-CM

## 2023-05-28 NOTE — Progress Notes (Signed)
Subjective:  Patient ID: Faith Padilla, female    DOB: Jan 07, 1951,  MRN: [id number]  Chief Complaint  Patient presents with   Toe Pain    Right foot 3rd toe pain / swelling     72 y.o. female presents with the above complaint.  Patient presents with right third digit DIPJ capsulitis painful to touch is progressive gotten worse worse with ambulation worse with pressure she said it came out of nowhere.  She does mention that she does have a history of gout for which she was on medication currently she is not.  Pain scale 7 out of 10 dull aching nature she does not recall any trauma to it.   Review of Systems: Negative except as noted in the HPI. Denies N/V/F/Ch.  Past Medical History:  Diagnosis Date   Acid reflux    Anxiety and depression    Asthma    Complication of anesthesia    slow to awaken, wakes coughing and SOB   COPD (chronic obstructive pulmonary disease) (HCC)    Depression    Diverticulitis    Glaucoma    HLD (hyperlipidemia)    Kidney stones    Microscopic hematuria    Mixed incontinence    Morbid obesity (HCC)    Osteoarthritis    lumbar spine   Shortness of breath dyspnea    Sleep apnea    CPAP   Vaginal yeast infection    Wears dentures    partial upper    Current Outpatient Medications:    albuterol (VENTOLIN HFA) 108 (90 Base) MCG/ACT inhaler, Inhale into the lungs., Disp: , Rfl:    allopurinol (ZYLOPRIM) 100 MG tablet, Take 1 tablet by mouth daily., Disp: , Rfl:    atorvastatin (LIPITOR) 20 MG tablet, TAKE 1 TABLET(20 MG) BY MOUTH AT BEDTIME, Disp: 90 tablet, Rfl: 0   Biotin 1 MG CAPS, Take 1 capsule by mouth daily., Disp: , Rfl:    CALCIUM PO, Take by mouth., Disp: , Rfl:    cetirizine (ZYRTEC) 10 MG tablet, Take 1 tablet (10 mg total) by mouth at bedtime., Disp: 30 tablet, Rfl: 3   Cyanocobalamin (VITAMIN B-12 PO), Take by mouth., Disp: , Rfl:    DULoxetine (CYMBALTA) 60 MG capsule, Take 1 capsule (60 mg total) by mouth daily. Reported on  12/26/2015, Disp: 90 capsule, Rfl: 3   gabapentin (NEURONTIN) 300 MG capsule, Take 300 mg by mouth 3 (three) times daily., Disp: , Rfl:    hydrOXYzine (ATARAX) 50 MG tablet, Take by mouth., Disp: , Rfl:    latanoprost (XALATAN) 0.005 % ophthalmic solution, 1 drop at bedtime., Disp: , Rfl:    levothyroxine (SYNTHROID) 75 MCG tablet, Take 1 tablet (75 mcg total) by mouth daily before breakfast., Disp: 90 tablet, Rfl: 3   MAGNESIUM PO, Take by mouth., Disp: , Rfl:    MELATONIN PO, Take by mouth as needed (sleep)., Disp: , Rfl:    methocarbamol (ROBAXIN) 500 MG tablet, Take 1 tablet by mouth every 6 (six) hours as needed., Disp: , Rfl:    montelukast (SINGULAIR) 10 MG tablet, TAKE 1 TABLET BY MOUTH EVERY EVENING TO PREVENT COUGH OR WHEEZING, Disp: 30 tablet, Rfl: 0   nystatin (MYCOSTATIN/NYSTOP) powder, Apply 1 Application topically 3 (three) times daily., Disp: 60 g, Rfl: 3   olopatadine (PATANOL) 0.1 % ophthalmic solution, INSTILL 1 DROP IN BOTH EYES EVERY DAY AS NEEDED FOR ITCHY EYES, Disp: 5 mL, Rfl: 3   omeprazole (PRILOSEC) 20 MG capsule, TAKE 1 CAPSULE(20  MG) BY MOUTH TWICE DAILY BEFORE A MEAL, Disp: 180 capsule, Rfl: 0   Oxycodone HCl 10 MG TABS, Take 1 tablet (10 mg total) by mouth every 8 (eight) hours as needed., Disp: 21 tablet, Rfl: 0   Probiotic Product (PROBIOTIC PO), Take by mouth., Disp: , Rfl:    solifenacin (VESICARE) 5 MG tablet, Take 1 tablet (5 mg total) by mouth daily., Disp: 30 tablet, Rfl: 2   topiramate (TOPAMAX) 50 MG tablet, Take by mouth., Disp: , Rfl:    Vitamin D, Ergocalciferol, (DRISDOL) 1.25 MG (50000 UNIT) CAPS capsule, Take by mouth., Disp: , Rfl:   Social History   Tobacco Use  Smoking Status Never  Smokeless Tobacco Never    Allergies  Allergen Reactions   Morphine Hives   Objective:  There were no vitals filed for this visit. There is no height or weight on file to calculate BMI. Constitutional Well developed. Well nourished.  Vascular Dorsalis  pedis pulses palpable bilaterally. Posterior tibial pulses palpable bilaterally. Capillary refill normal to all digits.  No cyanosis or clubbing noted. Pedal hair growth normal.  Neurologic Normal speech. Oriented to person, place, and time. Epicritic sensation to light touch grossly present bilaterally.  Dermatologic Nails well groomed and normal in appearance. No open wounds. No skin lesions.  Orthopedic: Pain on palpation of right third digit at the DIPJ.  Pain on palpation to the digit.  No contusion noted to the nail.  Nail as well as radial to the underlying nailbed.   Radiographs: None Assessment:   1. Capsulitis of toe, right    Plan:  Patient was evaluated and treated and all questions answered.  Right third digit DIPJ capsulitis -All questions and concerns were discussed with the patient in extensive detail -Given the amount of pain that she is having she will benefit from a steroid injection of decrease inflammatory component surgical pain.  Patient agrees with plan like to proceed with steroid injection -A steroid injection was performed at right dorsal DIPJ joint using 1% plain Lidocaine and 10 mg of Kenalog. This was well tolerated.   No follow-ups on file.

## 2023-06-05 ENCOUNTER — Ambulatory Visit: Payer: Medicare Other | Admitting: Allergy & Immunology

## 2023-06-15 ENCOUNTER — Encounter: Payer: Self-pay | Admitting: Urology

## 2023-06-15 ENCOUNTER — Ambulatory Visit (INDEPENDENT_AMBULATORY_CARE_PROVIDER_SITE_OTHER): Payer: 59 | Admitting: Urology

## 2023-06-15 VITALS — Ht 61.0 in | Wt 225.0 lb

## 2023-06-15 DIAGNOSIS — N3281 Overactive bladder: Secondary | ICD-10-CM | POA: Diagnosis not present

## 2023-06-15 DIAGNOSIS — R32 Unspecified urinary incontinence: Secondary | ICD-10-CM

## 2023-06-15 DIAGNOSIS — N3941 Urge incontinence: Secondary | ICD-10-CM | POA: Diagnosis not present

## 2023-06-15 NOTE — Progress Notes (Signed)
06/15/2023 2:41 PM   Faith Padilla 10/29/1950 [id number]  Referring provider: Ellyn Hack, MD [address] [place],  [place] [address]  No chief complaint on file.   HPI: I was consulted to assess the patient is urinary incontinence. Patient last saw nurse practitioner 7 years ago was on Vesicare.  Had some prolapse symptoms.  Estrogen cream provided.  Today Patient has urge incontinence but does not leak with coughing sneezing.  No bedwetting.  3 heavier briefs a day that are soaked.  Voids every 1 hour and cannot hold it for 2 hours.  Voids hourly at night.  No ankle edema.  No diuretic  Has had 2 low back operations and hysterectomy  Recently went on Vesicare and had dry mouth and retention symptoms  On further questioning patient was on Myrbetriq for years and she said it did not work.  In the last 6 months she says she has some suprapubic discomfort when she is full and during urination.  It is relieved when she voids  Bowel movements normal.  No bladder surgery.  No history of bladder infections     PMH: Past Medical History:  Diagnosis Date   Acid reflux    Anxiety and depression    Asthma    Complication of anesthesia    slow to awaken, wakes coughing and SOB   COPD (chronic obstructive pulmonary disease) (HCC)    Depression    Diverticulitis    Glaucoma    HLD (hyperlipidemia)    Kidney stones    Microscopic hematuria    Mixed incontinence    Morbid obesity (HCC)    Osteoarthritis    lumbar spine   Shortness of breath dyspnea    Sleep apnea    CPAP   Vaginal yeast infection    Wears dentures    partial upper    Surgical History: Past Surgical History:  Procedure Laterality Date   ABDOMINAL HYSTERECTOMY  1998   BACK SURGERY  1990/2014   CATARACT EXTRACTION, BILATERAL  2014   COLONOSCOPY WITH PROPOFOL N/A 03/25/2016   Procedure: COLONOSCOPY WITH PROPOFOL;  Surgeon: Midge Minium, MD;  Location: [facility] END[facility]COPY;  Service:  Endoscopy;  Laterality: N/A;   deviated nose septum surgery  2000   ESOPHAGOGASTRODUODENOSCOPY (EGD) WITH PROPOFOL N/A 03/25/2016   Procedure: ESOPHAGOGASTRODUODENOSCOPY (EGD) WITH PROPOFOL;  Surgeon: Midge Minium, MD;  Location: [facility] ENDOSCOPY;  Service: Endoscopy;  Laterality: N/A;   EYE SURGERY     LITHOTRIPSY  2012    Home Medications:  Allergies as of 06/15/2023       Reactions   Morphine Hives        Medication List        Accurate as of June 15, 2023  2:41 PM. If you have any questions, ask your nurse or doctor.          albuterol 108 (90 Base) MCG/ACT inhaler Commonly known as: VENTOLIN HFA Inhale into the lungs.   allopurinol 100 MG tablet Commonly known as: ZYLOPRIM Take 1 tablet by mouth daily.   atorvastatin 20 MG tablet Commonly known as: LIPITOR TAKE 1 TABLET(20 MG) BY MOUTH AT BEDTIME   Biotin 1 MG Caps Take 1 capsule by mouth daily.   CALCIUM PO Take by mouth.   cetirizine 10 MG tablet Commonly known as: ZYRTEC Take 1 tablet (10 mg total) by mouth at bedtime.   DULoxetine 60 MG capsule Commonly known as: CYMBALTA Take 1 capsule (60 mg total) by mouth  daily. Reported on 12/26/2015   gabapentin 300 MG capsule Commonly known as: NEURONTIN Take 300 mg by mouth 3 (three) times daily.   hydrOXYzine 50 MG tablet Commonly known as: ATARAX Take by mouth.   latanoprost 0.005 % ophthalmic solution Commonly known as: XALATAN 1 drop at bedtime.   levothyroxine 75 MCG tablet Commonly known as: SYNTHROID Take 1 tablet (75 mcg total) by mouth daily before breakfast.   MAGNESIUM PO Take by mouth.   MELATONIN PO Take by mouth as needed (sleep).   methocarbamol 500 MG tablet Commonly known as: ROBAXIN Take 1 tablet by mouth every 6 (six) hours as needed.   montelukast 10 MG tablet Commonly known as: SINGULAIR TAKE 1 TABLET BY MOUTH EVERY EVENING TO PREVENT COUGH OR WHEEZING   nystatin powder Commonly known as: MYCOSTATIN/NYSTOP Apply 1  Application topically 3 (three) times daily.   olopatadine 0.1 % ophthalmic solution Commonly known as: PATANOL INSTILL 1 DROP IN BOTH EYES EVERY DAY AS NEEDED FOR ITCHY EYES   omeprazole 20 MG capsule Commonly known as: PRILOSEC TAKE 1 CAPSULE(20 MG) BY MOUTH TWICE DAILY BEFORE A MEAL   Oxycodone HCl 10 MG Tabs Take 1 tablet (10 mg total) by mouth every 8 (eight) hours as needed.   PROBIOTIC PO Take by mouth.   solifenacin 5 MG tablet Commonly known as: VESICARE Take 1 tablet (5 mg total) by mouth daily.   topiramate 50 MG tablet Commonly known as: TOPAMAX Take by mouth.   VITAMIN B-12 PO Take by mouth.   Vitamin D (Ergocalciferol) 1.25 MG (50000 UNIT) Caps capsule Commonly known as: DRISDOL Take by mouth.        Allergies:  Allergies  Allergen Reactions   Morphine Hives    Family History: Family History  Problem Relation Age of Onset   Heart disease Mother    Diabetes Mother    Kidney disease Mother    Cancer Father        lung cancer   COPD Father    Liver cancer Father    Cancer Sister        Brain Cancer   Allergic rhinitis Sister    Asthma Sister    Hematuria Daughter    Bladder Cancer Neg Hx     Social History:  reports that she has never smoked. She has never used smokeless tobacco. She reports that she does not drink alcohol and does not use drugs.  ROS:                                        Physical Exam: There were no vitals taken for this visit.  Constitutional:  Alert and oriented, No acute distress. HEENT: [place] AT, moist mucus membranes.  Trachea midline, no masses.   Laboratory Data: Lab Results  Component Value Date   WBC 12.7 (H) 05/07/2023   HGB 12.6 05/07/2023   HCT 39.0 05/07/2023   MCV 87 05/07/2023   PLT 576 (H) 05/07/2023    Lab Results  Component Value Date   CREATININE 0.97 05/07/2023    No results found for: "PSA"  No results found for: "TESTOSTERONE"  Lab Results  Component  Value Date   HGBA1C 5.9 (H) 05/07/2023    Urinalysis    Component Value Date/Time   COLORURINE YELLOW (A) 03/04/2016 1920   APPEARANCEUR HAZY (A) 03/04/2016 1920   APPEARANCEUR Clear 04/12/2015 1644  LABSPEC 1.014 03/04/2016 1920   LABSPEC 1.014 07/24/2014 1519   PHURINE 7.0 03/04/2016 1920   GLUCOSEU NEGATIVE 03/04/2016 1920   GLUCOSEU Negative 07/24/2014 1519   HGBUR NEGATIVE 03/04/2016 1920   BILIRUBINUR NEGATIVE 03/04/2016 1920   BILIRUBINUR neg 12/06/2015 1448   BILIRUBINUR Trace (A) 04/12/2015 1644   BILIRUBINUR Negative 07/24/2014 1519   KETONESUR NEGATIVE 03/04/2016 1920   PROTEINUR NEGATIVE 03/04/2016 1920   UROBILINOGEN negative 12/06/2015 1448   NITRITE NEGATIVE 03/04/2016 1920   LEUKOCYTESUR NEGATIVE 03/04/2016 1920   LEUKOCYTESUR 1+ (A) 04/12/2015 1644   LEUKOCYTESUR 2+ 07/24/2014 1519    Pertinent Imaging: Urine reviewed and sent for culture.  Chart reviewed  Assessment & Plan: Patient has an overactive bladder with urge incontinence.  She has frequency and significant nocturia.  Reassess in 6 weeks on Gemtesa samples and prescription for pelvic examination and cystoscopy.  Call if culture positive.  She understands that a bladder infection would explain the discomfort but otherwise I will be performing cystoscopy for incontinence and discomfort with urination  There are no diagnoses linked to this encounter.  No follow-ups on file.  Martina Sinner, MD  [facility] [address] [place], [place] [address] [phone]

## 2023-06-16 LAB — URINALYSIS, COMPLETE
Bilirubin, UA: NEGATIVE
Glucose, UA: NEGATIVE
Ketones, UA: NEGATIVE
Nitrite, UA: POSITIVE — AB
Specific Gravity, UA: 1.02 (ref 1.005–1.030)
Urobilinogen, Ur: 0.2 mg/dL (ref 0.2–1.0)
pH, UA: 6.5 (ref 5.0–7.5)

## 2023-06-16 LAB — MICROSCOPIC EXAMINATION: WBC, UA: >30 /[HPF] — AB (ref 0–5)

## 2023-06-18 LAB — CULTURE, URINE COMPREHENSIVE

## 2023-06-23 ENCOUNTER — Telehealth: Payer: Self-pay | Admitting: Physician Assistant

## 2023-06-23 NOTE — Telephone Encounter (Signed)
The patient called in wanting to know who called her. I did ask our CMA and the PA they stated no they didn't call. There was no notes in the system.

## 2023-06-25 ENCOUNTER — Other Ambulatory Visit: Payer: Self-pay

## 2023-06-25 ENCOUNTER — Telehealth: Payer: Self-pay | Admitting: Urology

## 2023-06-25 DIAGNOSIS — M7661 Achilles tendinitis, right leg: Secondary | ICD-10-CM | POA: Insufficient documentation

## 2023-06-25 DIAGNOSIS — G609 Hereditary and idiopathic neuropathy, unspecified: Secondary | ICD-10-CM

## 2023-06-25 DIAGNOSIS — M722 Plantar fascial fibromatosis: Secondary | ICD-10-CM

## 2023-06-25 HISTORY — DX: Hereditary and idiopathic neuropathy, unspecified: G60.9

## 2023-06-25 HISTORY — DX: Plantar fascial fibromatosis: M72.2

## 2023-06-25 HISTORY — DX: Achilles tendinitis, right leg: M76.61

## 2023-06-25 NOTE — Telephone Encounter (Signed)
Patient called the office today and left a voice mail message inquiring about urine culture results.  She is still experiencing pain and requesting medication.

## 2023-06-26 MED ORDER — CIPROFLOXACIN HCL 250 MG PO TABS: 250.0000 mg | ORAL_TABLET | Freq: Two times a day (BID) | ORAL | 0 refills | Status: AC

## 2023-06-26 NOTE — Telephone Encounter (Signed)
Per Dr. Sherron Monday:  Ciprofloxacin 250 mg twice a day for 7 days    Left pt message that rx was called in to [facility] and to call if any questions

## 2023-06-30 ENCOUNTER — Ambulatory Visit: Payer: 59 | Admitting: Physician Assistant

## 2023-06-30 ENCOUNTER — Encounter: Payer: Self-pay | Admitting: Physician Assistant

## 2023-06-30 ENCOUNTER — Ambulatory Visit: Payer: Medicare Other | Admitting: Internal Medicine

## 2023-06-30 VITALS — BP 115/72 | HR 94 | Temp 98.0°F | Ht 61.0 in | Wt 226.2 lb

## 2023-06-30 DIAGNOSIS — Z860100 Personal history of colon polyps, unspecified: Secondary | ICD-10-CM

## 2023-06-30 DIAGNOSIS — R159 Full incontinence of feces: Secondary | ICD-10-CM

## 2023-06-30 DIAGNOSIS — R152 Fecal urgency: Secondary | ICD-10-CM

## 2023-06-30 DIAGNOSIS — R194 Change in bowel habit: Secondary | ICD-10-CM | POA: Diagnosis not present

## 2023-06-30 NOTE — Progress Notes (Signed)
Faith Amy, PA-C [address]  [address]  [place], [place] [address]  Main: [phone]  Fax: [phone]   Gastroenterology Consultation  Referring Provider:     Alyson Reedy, FNP Primary Care Physician:  Alyson Reedy, FNP Primary Gastroenterologist:  Faith Amy, PA-C / Dr. Midge Minium   Reason for Consultation:     Fecal incontinence, irregular bowel movements        HPI:   Faith Padilla is a 72 y.o. y/o female referred for consultation & management  by Alyson Reedy, FNP.  She is here today with her daughter Faith Padilla.  Reports having previous colonoscopies in [place], [place].  Recently moved here and is transferring care to our office.  Last colonoscopy was approximately 3 years ago in [place].  She states she is due for a 3-year repeat colonoscopy due to a history of polyps.  We are requesting GI records.  She is having loose stools with fecal incontinence.  This started after she had hip replacement surgery 04/21/2023.  She has 2 or 3 bowel movements daily.  Hard stools alternating with loose stool.  Has urgent bowel movement which is difficult to control.  Occasional constipation.  She took MiraLAX immediately after hip replacement surgery and discontinued due to diarrhea.  Stools are improving.  She denies rectal pain, rectal bleeding, abdominal pain, or weight loss.  She has had episodes of diarrhea alternating with constipation for several years.  Not currently on a fiber supplement.  Medical history significant for pulmonary fibrosis.  She uses inhalers with benefit.  She does not take any blood thinners.  Colonoscopy 03/2016 by Dr. Servando Snare showed excellent prep, and 7 small (3 mm to 6 mm) tubular adenoma polyps removed.  Sigmoid diverticulosis, and internal hemorrhoids.  EGD 03/2016 by Dr. Servando Snare showed 6 cm hiatal hernia, minimal gastritis, normal duodenum.  Biopsies negative for H. pylori.  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   Diverticulitis    Dry eye syndrome of both eyes 08/13/2021   Dyspnea on exertion 08/13/2021   Family history of problems related to stress 11/15/2021   Frequency of urination and polyuria 06/25/2022   GAD (generalized anxiety disorder) 08/15/2021   Glaucoma    H/O urinary incontinence 06/25/2022   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   High risk medication use 08/02/2018   HLD (hyperlipidemia)    Iron deficiency 12/04/2019   Isolation (social) 12/07/2021   Kidney stones    Microscopic hematuria    Mixed incontinence    Morbid obesity (HCC)    Ocular hypertension, 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   Ocular migraine 08/02/2018   Osteoarthritis    lumbar spine   Palpitations 08/13/2021   Plantar fasciitis 06/25/2023   Post covid-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, 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   Postoperative follow-up 03/30/2020   Prediabetes 06/13/2016   Pseudophakia, both eyes 06/26/2016   Rectal bleeding 10/17/2021   Shortness of breath dyspnea    Sleep apnea    CPAP   Vaginal yeast infection  Wears dentures    partial upper    Past Surgical History:  Procedure Laterality Date   ABDOMINAL HYSTERECTOMY  1998   BACK SURGERY  1990/2014   CATARACT EXTRACTION, BILATERAL  2014   COLONOSCOPY WITH PROPOFOL N/A 03/25/2016   Procedure: COLONOSCOPY WITH PROPOFOL;  Surgeon: Midge Minium, MD;  Location: [facility];  Service: Endoscopy;  Laterality: N/A;   deviated nose septum surgery  2000   ESOPHAGOGASTRODUODENOSCOPY (EGD) WITH PROPOFOL N/A 03/25/2016    Procedure: ESOPHAGOGASTRODUODENOSCOPY (EGD) WITH PROPOFOL;  Surgeon: Midge Minium, MD;  Location: [facility];  Service: Endoscopy;  Laterality: N/A;   EYE SURGERY     LITHOTRIPSY  2012    Prior to Admission medications   Medication Sig Start Date End Date Taking? Authorizing Provider  albuterol (VENTOLIN HFA) 108 (90 Base) MCG/ACT inhaler Inhale into the lungs. 04/04/13   [provider]  atorvastatin (LIPITOR) 20 MG tablet TAKE 1 TABLET(20 MG) BY MOUTH AT BEDTIME 03/07/16   Ellyn Hack, MD  Biotin 1 MG CAPS Take 1 capsule by mouth daily.    [provider]  CALCIUM PO Take by mouth.    [provider]  cetirizine (ZYRTEC) 10 MG tablet Take 1 tablet (10 mg total) by mouth at bedtime. 09/05/16   Alfonse Spruce, MD  ciprofloxacin (CIPRO) 250 MG tablet Take 1 tablet (250 mg total) by mouth 2 (two) times daily for 7 days. 06/26/23 07/03/23  Alfredo Martinez, MD  Cyanocobalamin (VITAMIN B-12 PO) Take by mouth.    [provider]  DULoxetine (CYMBALTA) 60 MG capsule Take 1 capsule (60 mg total) by mouth daily. Reported on 12/26/2015 05/07/23   Alyson Reedy, FNP  gabapentin (NEURONTIN) 300 MG capsule Take 300 mg by mouth 3 (three) times daily.    [provider]  hydrOXYzine (ATARAX) 50 MG tablet Take by mouth. 11/24/19   [provider]  levothyroxine (SYNTHROID) 75 MCG tablet Take 1 tablet (75 mcg total) by mouth daily before breakfast. 05/07/23   Alyson Reedy, FNP  MAGNESIUM PO Take by mouth.    [provider]  MELATONIN PO Take by mouth as needed (sleep).    [provider]  montelukast (SINGULAIR) 10 MG tablet TAKE 1 TABLET BY MOUTH EVERY EVENING TO PREVENT COUGH OR WHEEZING 11/03/16   Kozlow, Alvira Philips, MD  nystatin (MYCOSTATIN/NYSTOP) powder Apply 1 Application topically 3 (three) times daily. 05/07/23   Alyson Reedy, FNP  omeprazole (PRILOSEC) 20 MG capsule TAKE 1 CAPSULE(20 MG) BY MOUTH TWICE DAILY  BEFORE A MEAL 04/14/16   Ellyn Hack, MD  Probiotic Product (PROBIOTIC PO) Take by mouth.    [provider]  solifenacin (VESICARE) 5 MG tablet Take 1 tablet (5 mg total) by mouth daily. 05/07/23   Alyson Reedy, FNP  topiramate (TOPAMAX) 50 MG tablet Take by mouth. 12/17/22   [provider]  Vibegron (GEMTESA) 75 MG TABS Take 1 tablet by mouth daily.    [provider]  Vitamin D, Ergocalciferol, (DRISDOL) 1.25 MG (50000 UNIT) CAPS capsule Take by mouth. 12/31/18   [provider]    Family History  Problem Relation Age of Onset   Heart disease Mother    Diabetes Mother    Kidney disease Mother    Cancer Father        lung cancer   COPD Father    Liver cancer Father    Cancer Sister        Brain Cancer  Allergic rhinitis Sister    Asthma Sister    Hematuria Daughter    Bladder Cancer Neg Hx      Social History   Tobacco Use   Smoking status: Never    Passive exposure: Never   Smokeless tobacco: Never  Vaping Use   Vaping status: Never Used  Substance Use Topics   Alcohol use: No   Drug use: No    Allergies as of 06/30/2023 - Review Complete 06/30/2023  Allergen Reaction Noted   Morphine Hives 12/11/2014    Review of Systems:    All systems reviewed and negative except where noted in HPI.   Physical Exam:  BP 115/72   Pulse 94   Temp 98 F (36.7 C)   Ht 5\' 1"  (1.549 m)   Wt 226 lb 3.2 oz (102.6 kg)   BMI 42.74 kg/m  No LMP recorded. Patient has had a hysterectomy.  General:   Alert,  Well-developed, well-nourished, pleasant and cooperative in NAD Lungs:  Respirations even and unlabored.  Clear throughout to auscultation.   No wheezes, crackles, or rhonchi. No acute distress. Heart:  Regular rate and rhythm; no murmurs, clicks, rubs, or gallops. Abdomen:  Normal bowel sounds.  No bruits.  Soft, and non-distended without masses, hepatosplenomegaly or hernias noted.  No Tenderness.  No guarding or rebound tenderness.     Neurologic:  Alert and oriented x3;  grossly normal neurologically. Psych:  Alert and cooperative. Normal mood and affect.  Imaging Studies: No results found.  Assessment and Plan:   Mirsa Fate is a 72 y.o. y/o female has been referred for:   1.  Change in bowel habits -started after she had hip replacement surgery.  History of irregular bowel habits for many years.  Has constipation alternating with diarrhea.  2.  Fecal incontinence  Start FiberCon tablet 2 tablets once daily.  Increase to twice daily as needed.  3.  History of colon polyps  Scheduling Colonoscopy I discussed risks of colonoscopy with patient to include risk of bleeding, colon perforation, and risk of sedation.  Patient expressed understanding and agrees to proceed with colonoscopy.   I am requesting previous colonoscopy reports and GI records from [place], [place] for review.  Follow up based on colonoscopy and GI symptoms.  Faith Amy, PA-C

## 2023-06-30 NOTE — Patient Instructions (Signed)
Start FiberCon 1 tablet, take 2 tablets once daily with 8 ounces of water. Increase FiberCon to 2 tablets twice daily as needed. Adjust fiber dose based on bowel habits. Hold fiber supplement 1 week prior to colonoscopy to ensure good prep.

## 2023-07-02 ENCOUNTER — Ambulatory Visit: Payer: Medicare Other | Admitting: Nurse Practitioner

## 2023-07-07 ENCOUNTER — Encounter: Payer: Self-pay | Admitting: Internal Medicine

## 2023-07-07 ENCOUNTER — Ambulatory Visit (HOSPITAL_BASED_OUTPATIENT_CLINIC_OR_DEPARTMENT_OTHER): Payer: Medicare Other | Admitting: Family Medicine

## 2023-07-07 ENCOUNTER — Ambulatory Visit (INDEPENDENT_AMBULATORY_CARE_PROVIDER_SITE_OTHER): Payer: 59 | Admitting: Internal Medicine

## 2023-07-07 VITALS — BP 112/68 | HR 78 | Temp 98.3°F | Resp 18 | Ht 61.0 in | Wt 227.5 lb

## 2023-07-07 DIAGNOSIS — J4520 Mild intermittent asthma, uncomplicated: Secondary | ICD-10-CM | POA: Diagnosis not present

## 2023-07-07 DIAGNOSIS — J3089 Other allergic rhinitis: Secondary | ICD-10-CM | POA: Diagnosis not present

## 2023-07-07 MED ORDER — ALBUTEROL SULFATE HFA 108 (90 BASE) MCG/ACT IN AERS: 1.0000 | INHALATION_SPRAY | Freq: Four times a day (QID) | RESPIRATORY_TRACT | 1 refills | Status: DC | PRN

## 2023-07-07 MED ORDER — MONTELUKAST SODIUM 10 MG PO TABS: 10.0000 mg | ORAL_TABLET | Freq: Every day | ORAL | 5 refills | Status: DC

## 2023-07-07 MED ORDER — FLUTICASONE PROPIONATE 50 MCG/ACT NA SUSP: 2.0000 | Freq: Every day | NASAL | 5 refills | Status: DC

## 2023-07-07 NOTE — Progress Notes (Signed)
NEW PATIENT  Date of Service/Encounter:  07/07/23  Consult requested by: Faith Reedy, FNP   Subjective:   Faith Padilla (DOB: [birth year]) is a 72 y.o. female who presents to the clinic on 07/07/2023 with a chief complaint of Sinusitis, Cough, and Establish Care .    History obtained from: chart review and patient.   Asthma:  Diagnosed around age 21 with asthma.  [place]. Also chart review shows prior hx of pulmonary fibrosis after COVID, referred to see Pulm.  Denies much issues with dyspnea, dry cough.  Also has hx of COPD listed in chart but no tobacco use in the past.  Not the best historian.  Reports her breathing has generally done okay overall.   About a week ago, she has noticed increased wet coughing with post nasal drip.  She does note the cough responds to allergy medications.  Has tried using her albuterol inhaler but does not seem to help; denies SOB/wheezing with the cough.   Not sure but has had daily cough the last week daytime symptoms in past month, not sure nighttime awakenings in past month Using rescue inhaler: few times this week due to cough but does not help with the cough, not sure about how often previously Limitations to daily activity: mild 0 ED visits/UC visits and 0 oral steroids in the past year 0 number of lifetime hospitalizations, 0 number of lifetime intubations.  Identified Triggers:  illness Prior PFTs or spirometry: spirometry in 2016-2017 without obstruction Previously used therapies: Symbicort  Current regimen:  Maintenance: none Rescue: Albuterol 2 puffs q4-6 hrs PRN  Rhinitis:  Started many years ago.  Symptoms include: nasal congestion, rhinorrhea, post nasal drainage, itchy eyes, and itchy nose  Occurs seasonally-Fall Potential triggers: not sure  Treatments tried:  Flonase Zyrtec Singulair  Previous allergy testing: yes over 10 years ago History of sinus surgery: no Nonallergic triggers: none  Past  Medical History: Past Medical History:  Diagnosis Date   Achilles tendinitis of right lower extremity 06/25/2023   Acid reflux    Allergic conjunctivitis of both eyes 06/29/2017   Anemia 12/04/2019   Anxiety and depression    Asthma    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) 08/13/2021   Complication of anesthesia    slow to awaken, wakes coughing and SOB   COPD (chronic obstructive pulmonary disease) (HCC)    Depression    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   Diverticulitis    Dry eye syndrome of both eyes 08/13/2021   Dyspnea on exertion 08/13/2021   Family history of problems related to stress 11/15/2021   Frequency of urination and polyuria 06/25/2022   GAD (generalized anxiety disorder) 08/15/2021   Glaucoma    H/O urinary incontinence 06/25/2022   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   High risk medication use 08/02/2018   HLD (hyperlipidemia)    Iron deficiency 12/04/2019   Isolation (social) 12/07/2021   Kidney stones    Microscopic hematuria    Mixed incontinence    Morbid obesity (HCC)    Ocular hypertension, 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   Ocular migraine 08/02/2018   Osteoarthritis    lumbar spine   Palpitations 08/13/2021   Plantar fasciitis 06/25/2023   Post covid-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, 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   Postoperative follow-up 03/30/2020   Prediabetes 06/13/2016   Pseudophakia, both eyes 06/26/2016   Rectal bleeding 10/17/2021   Shortness of breath dyspnea    Sleep apnea    CPAP  Vaginal yeast infection    Wears dentures    partial upper    Past Surgical History: Past Surgical History:  Procedure Laterality Date   ABDOMINAL HYSTERECTOMY  1998   BACK SURGERY  1990/2014   CATARACT EXTRACTION, BILATERAL  2014   COLONOSCOPY WITH PROPOFOL N/A 03/25/2016   Procedure: COLONOSCOPY WITH PROPOFOL;  Surgeon: Midge Minium, MD;  Location: [facility] ENDOSCOPY;  Service: Endoscopy;  Laterality:  N/A;   deviated nose septum surgery  2000   ESOPHAGOGASTRODUODENOSCOPY (EGD) WITH PROPOFOL N/A 03/25/2016   Procedure: ESOPHAGOGASTRODUODENOSCOPY (EGD) WITH PROPOFOL;  Surgeon: Midge Minium, MD;  Location: [facility] ENDOSCOPY;  Service: Endoscopy;  Laterality: N/A;   EYE SURGERY     LITHOTRIPSY  2012    Family History: Family History  Problem Relation Age of Onset   Asthma Mother    Heart disease Mother    Diabetes Mother    Kidney disease Mother    Cancer Father        lung cancer   COPD Father    Liver cancer Father    Cancer Sister        Brain Cancer   Allergic rhinitis Sister    Asthma Sister    Hematuria Daughter    Bladder Cancer Neg Hx     Social History:  Flooring in bedroom: carpet Pets: cat Tobacco use/exposure: none Job: retired   Medication List:  Allergies as of 07/07/2023       Reactions   Morphine Hives        Medication List        Accurate as of July 07, 2023 10:55 AM. If you have any questions, ask your nurse or doctor.          albuterol 108 (90 Base) MCG/ACT inhaler Commonly known as: VENTOLIN HFA Inhale into the lungs.   atorvastatin 20 MG tablet Commonly known as: LIPITOR TAKE 1 TABLET(20 MG) BY MOUTH AT BEDTIME   Biotin 1 MG Caps Take 1 capsule by mouth daily.   CALCIUM PO Take by mouth.   cetirizine 10 MG tablet Commonly known as: ZYRTEC Take 1 tablet (10 mg total) by mouth at bedtime.   DULoxetine 60 MG capsule Commonly known as: CYMBALTA Take 1 capsule (60 mg total) by mouth daily. Reported on 12/26/2015   gabapentin 300 MG capsule Commonly known as: NEURONTIN Take 300 mg by mouth 3 (three) times daily.   hydrOXYzine 50 MG tablet Commonly known as: ATARAX Take by mouth.   levothyroxine 75 MCG tablet Commonly known as: SYNTHROID Take 1 tablet (75 mcg total) by mouth daily before breakfast.   MAGNESIUM PO Take by mouth.   montelukast 10 MG tablet Commonly known as: SINGULAIR TAKE 1 TABLET BY MOUTH EVERY  EVENING TO PREVENT COUGH OR WHEEZING   nystatin powder Commonly known as: MYCOSTATIN/NYSTOP Apply 1 Application topically 3 (three) times daily.   omeprazole 20 MG capsule Commonly known as: PRILOSEC TAKE 1 CAPSULE(20 MG) BY MOUTH TWICE DAILY BEFORE A MEAL   PROBIOTIC PO Take by mouth.   solifenacin 5 MG tablet Commonly known as: VESICARE Take 1 tablet (5 mg total) by mouth daily.   topiramate 50 MG tablet Commonly known as: TOPAMAX Take by mouth.   VITAMIN B-12 PO Take by mouth.   Vitamin D (Ergocalciferol) 1.25 MG (50000 UNIT) Caps capsule Commonly known as: DRISDOL Take by mouth.         REVIEW OF SYSTEMS: Pertinent positives and negatives discussed in HPI.   Objective:  Physical Exam: BP 112/68   Pulse 78   Temp 98.3 F (36.8 C) (Temporal)   Resp 18   Ht 5\' 1"  (1.549 m)   Wt 227 lb 8 oz (103.2 kg)   SpO2 95%   BMI 42.99 kg/m  Body mass index is 42.99 kg/m. GEN: alert, well developed HEENT: clear conjunctiva, TM grey and translucent, nose with + mild inferior turbinate hypertrophy, pink nasal mucosa, slight clear rhinorrhea, + cobblestoning HEART: regular rate and rhythm, no murmur LUNGS: clear to auscultation bilaterally without crackles or wheezing, + wet coughing, unlabored respiration ABDOMEN: soft, non distended  SKIN: no rashes or lesions  Reviewed:  05/07/2023: seen by Charm Barges NP to establish care. Reported hx of asthma and pulmonary fibrosis after COVID in 2021. Referred to pulm and allergy. Also with OSA on CPAP.   01/08/2023: seen by [facility] for depression/anxiety.  She is on Cymbalta, hydroxyzine, Brexiprazole; for pain Gabapentin, Naproxen, Hydrocodone. Discussed neurological evaluation and psychotherapy.   07/02/2022: seen by Hematology for anemia, iron deficiency.   06/30/2023: seen by Sherre Scarlet PA for history of colon polyps, irregular bowel habits.  Planning for colonoscopy.   2016-2017: seen by Allergy for  asthma, sinusitis, allergic rhinitis.  Used to be on Flonase, Symbicort, Singulair, Albuterol.   Spirometry:  Tracings reviewed. Her effort: Good reproducible efforts. FVC: 2.44L, 98% FEV1: 1.89L, 98% predicted FEV1/FVC ratio: 77% Interpretation: Spirometry consistent with normal pattern.  Please see scanned spirometry results for details.     Assessment:   1. Other allergic rhinitis   2. Mild intermittent asthma without complication     Plan/Recommendations:  Other Allergic Rhinitis: - Due to turbinate hypertrophy, seasonal symptoms and unresponsive to over the counter meds, will perform skin testing to identify aeroallergen triggers.   - Use nasal saline rinses before nose sprays such as with Neilmed Sinus Rinse.  Use distilled water.   - Use Flonase 2 sprays each nostril daily. Aim upward and outward. - Use Zyrtec 10 mg daily.   Hold all anti histamines (Zyrtec, Claritin, Benadryl, Allegra, Xyzal) 3 days prior to next visit.  Stop Hydroxyzine 1-2 nights prior.  - Use Singulair (Montelukast) 10mg  daily.  Stop if there are any mood/behavioral changes.   Mild Intermittent Asthma: - MDI technique discussed.   - Discussed current cough is likely due to uncontrolled rhinitis, possibly allergic or infectious.  Low suspicion for asthma flare up based on lack of response to albuterol, normal spirometry, no wheezing/SOB.  - Rescue inhaler: Albuterol 2 puffs via spacer or 1 vial via nebulizer every 4-6 hours as needed for respiratory symptoms of cough, shortness of breath, or wheezing Asthma control goals:  Full participation in all desired activities (may need albuterol before activity) Albuterol use two times or less a week on average (not counting use with activity) Cough interfering with sleep two times or less a month Oral steroids no more than once a year No hospitalizations   Follow up: 11/8 at 1:30 PM for skin testing 1-55     No follow-ups on file.  Alesia Morin,  MD [facility]

## 2023-07-07 NOTE — Patient Instructions (Addendum)
Other Allergic Rhinitis: - Use nasal saline rinses before nose sprays such as with Neilmed Sinus Rinse.  Use distilled water.   - Use Flonase 2 sprays each nostril daily. Aim upward and outward. - Use Zyrtec 10 mg daily.   Hold all anti histamines (Zyrtec, Claritin, Benadryl, Allegra, Xyzal) 3 days prior to next visit.  Stop Hydroxyzine 1-2 nights prior.  - Use Singulair (Montelukast) 10mg  daily.  Stop if there are any mood/behavioral changes.   Mild Intermittent Asthma:  - Rescue inhaler: Albuterol 2 puffs via spacer or 1 vial via nebulizer every 4-6 hours as needed for respiratory symptoms of cough, shortness of breath, or wheezing Asthma control goals:  Full participation in all desired activities (may need albuterol before activity) Albuterol use two times or less a week on average (not counting use with activity) Cough interfering with sleep two times or less a month Oral steroids no more than once a year No hospitalizations   Follow up: 11/8 at 1:30 PM for skin testing

## 2023-07-17 ENCOUNTER — Ambulatory Visit (INDEPENDENT_AMBULATORY_CARE_PROVIDER_SITE_OTHER): Payer: 59 | Admitting: Internal Medicine

## 2023-07-17 DIAGNOSIS — J3089 Other allergic rhinitis: Secondary | ICD-10-CM

## 2023-07-17 NOTE — Progress Notes (Signed)
FOLLOW UP Date of Service/Encounter:  07/17/23   Subjective:  Faith Padilla (DOB: 08-Nov-1950) is a 72 y.o. female [facility] for follow up for skin testing.   History obtained from: chart review and patient.  Anti histamines held.  Not as much cough as last visit, getting better.   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   Diverticulitis    Dry eye syndrome of both eyes 08/13/2021   Dyspnea on exertion 08/13/2021   Family history of problems related to stress 11/15/2021   Frequency of urination and polyuria 06/25/2022   GAD (generalized anxiety disorder) 08/15/2021   Glaucoma    H/O urinary incontinence 06/25/2022   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   High risk medication use 08/02/2018   HLD (hyperlipidemia)    Iron deficiency 12/04/2019   Isolation (social) 12/07/2021   Kidney stones    Microscopic hematuria    Mixed incontinence    Morbid obesity (HCC)    Ocular hypertension, 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 There is no height or weight on file to calculate BMI. Physical Exam: GEN: alert, well developed HEENT: clear conjunctiva, MMM LUNGS: no coughing, unlabored respiration  Skin Testing:  Skin prick testing was placed, which includes aeroallergens/foods, histamine control, and saline control.  Verbal consent was obtained prior to placing test.  Patient tolerated procedure well.  Allergy testing results were read and interpreted by myself, documented by clinical staff. Adequate positive and negative control.  Positive results to:  Results discussed with patient/family.  Airborne Adult Perc - 07/17/23 1350     Time Antigen Placed 1350    Allergen Manufacturer Waynette Buttery    Location Back    Number of Test 55    1. Control-Buffer 50% Glycerol Negative    2. Control-Histamine Negative    3. Bahia Negative    4. French Southern Territories Negative    5. Johnson Negative    6. Kentucky Blue Negative    7. Meadow Fescue Negative    8. Perennial Rye Negative    9. Timothy Negative    10. Ragweed Mix Negative    11. Cocklebur Negative    12. Plantain,  English Negative    13. Baccharis Negative    14. Dog Fennel Negative    15. Russian Thistle Negative    16. Lamb's Quarters Negative    17. Sheep Sorrell Negative    18. Rough Pigweed Negative  19. Marsh Elder, Rough Negative    20. Mugwort, Common Negative    21. Box, Elder Negative    22. Cedar, red Negative    23. Sweet Gum Negative    24. Pecan Pollen Negative    25. Pine Mix Negative    26. Walnut, Black Pollen Negative    27. Red Mulberry Negative    28. Ash Mix Negative    29. Birch Mix Negative    30. Beech American Negative    31. Cottonwood, Guinea-Bissau Negative    32. Hickory, White Negative    33. Maple Mix Negative    34. Oak, [place] Mix Negative    35. Sycamore Eastern Negative    36. Alternaria Alternata Negative    37. Cladosporium Herbarum Negative    38. Aspergillus Mix  Negative    39. Penicillium Mix Negative    40. Bipolaris Sorokiniana (Helminthosporium) Negative    41. Drechslera Spicifera (Curvularia) Negative    42. Mucor Plumbeus Negative    43. Fusarium Moniliforme Negative    44. Aureobasidium Pullulans (pullulara) Negative    45. Rhizopus Oryzae Negative    46. Botrytis Cinera Negative    47. Epicoccum Nigrum Negative    48. Phoma Betae Negative    49. Dust Mite Mix Negative    50. Cat Hair 10,000 BAU/ml Negative    51.  Dog Epithelia Negative    52. Mixed Feathers Negative    53. Horse Epithelia Negative    54. Cockroach, German Negative    55. Tobacco Leaf Negative              Assessment:   1. Other allergic rhinitis     Plan/Recommendations:  Other Allergic Rhinitis: - Due to turbinate hypertrophy, seasonal symptoms and unresponsive to over the counter meds, will perform skin testing to identify aeroallergen triggers.  - SPT 07/2023: histamine did not react so will obtain blood testing.  - Use nasal saline rinses before nose sprays such as with Neilmed Sinus Rinse.  Use distilled water.   - Use Flonase 2 sprays each nostril daily or 1 spray each nostril twice daily. Aim upward and outward. - Use Zyrtec 10 mg daily as needed for runny nose, sneezing, itchy watery eyes.  - Use Singulair (Montelukast) 10mg  daily.  Stop if there are any mood/behavioral changes.   Mild Intermittent Asthma:  - Rescue inhaler: Albuterol 2 puffs via spacer or 1 vial via nebulizer every 4-6 hours as needed for respiratory symptoms of cough, shortness of breath, or wheezing Asthma control goals:  Full participation in all desired activities (may need albuterol before activity) Albuterol use two times or less a week on average (not counting use with activity) Cough interfering with sleep two times or less a month Oral steroids no more than once a year No hospitalizations      Return in about 3 months (around 10/17/2023).  Alesia Morin,  MD [facility]

## 2023-07-17 NOTE — Patient Instructions (Addendum)
Other Allergic Rhinitis: - SPT 07/2023: histamine did not react so will obtain blood testing.  - Use nasal saline rinses before nose sprays such as with Neilmed Sinus Rinse.  Use distilled water.   - Use Flonase 2 sprays each nostril daily or 1 spray each nostril twice daily. Aim upward and outward. - Use Zyrtec 10 mg daily as needed for runny nose, sneezing, itchy watery eyes.  - Use Singulair (Montelukast) 10mg  daily.  Stop if there are any mood/behavioral changes.  Mild Intermittent Asthma:  - Rescue inhaler: Albuterol 2 puffs via spacer or 1 vial via nebulizer every 4-6 hours as needed for respiratory symptoms of cough, shortness of breath, or wheezing Asthma control goals:  Full participation in all desired activities (may need albuterol before activity) Albuterol use two times or less a week on average (not counting use with activity) Cough interfering with sleep two times or less a month Oral steroids no more than once a year No hospitalizations

## 2023-07-20 ENCOUNTER — Encounter: Payer: Self-pay | Admitting: Family Medicine

## 2023-07-20 ENCOUNTER — Ambulatory Visit: Payer: 59 | Admitting: Family Medicine

## 2023-07-20 VITALS — HR 70 | Temp 98.6°F | Ht 61.0 in | Wt 236.0 lb

## 2023-07-20 DIAGNOSIS — K219 Gastro-esophageal reflux disease without esophagitis: Secondary | ICD-10-CM

## 2023-07-20 DIAGNOSIS — E785 Hyperlipidemia, unspecified: Secondary | ICD-10-CM | POA: Diagnosis not present

## 2023-07-20 DIAGNOSIS — N182 Chronic kidney disease, stage 2 (mild): Secondary | ICD-10-CM

## 2023-07-20 DIAGNOSIS — Z7689 Persons encountering health services in other specified circumstances: Secondary | ICD-10-CM

## 2023-07-20 DIAGNOSIS — R7303 Prediabetes: Secondary | ICD-10-CM

## 2023-07-20 DIAGNOSIS — M797 Fibromyalgia: Secondary | ICD-10-CM

## 2023-07-20 DIAGNOSIS — J4540 Moderate persistent asthma, uncomplicated: Secondary | ICD-10-CM

## 2023-07-20 DIAGNOSIS — Z1159 Encounter for screening for other viral diseases: Secondary | ICD-10-CM

## 2023-07-20 DIAGNOSIS — Z23 Encounter for immunization: Secondary | ICD-10-CM | POA: Diagnosis not present

## 2023-07-20 DIAGNOSIS — D75839 Thrombocytosis, unspecified: Secondary | ICD-10-CM | POA: Insufficient documentation

## 2023-07-20 DIAGNOSIS — E039 Hypothyroidism, unspecified: Secondary | ICD-10-CM | POA: Diagnosis not present

## 2023-07-20 DIAGNOSIS — D508 Other iron deficiency anemias: Secondary | ICD-10-CM | POA: Insufficient documentation

## 2023-07-20 DIAGNOSIS — R7989 Other specified abnormal findings of blood chemistry: Secondary | ICD-10-CM | POA: Insufficient documentation

## 2023-07-20 DIAGNOSIS — J8410 Pulmonary fibrosis, unspecified: Secondary | ICD-10-CM

## 2023-07-20 DIAGNOSIS — Z713 Dietary counseling and surveillance: Secondary | ICD-10-CM

## 2023-07-20 DIAGNOSIS — Z1231 Encounter for screening mammogram for malignant neoplasm of breast: Secondary | ICD-10-CM

## 2023-07-20 DIAGNOSIS — M810 Age-related osteoporosis without current pathological fracture: Secondary | ICD-10-CM

## 2023-07-20 DIAGNOSIS — F324 Major depressive disorder, single episode, in partial remission: Secondary | ICD-10-CM

## 2023-07-20 DIAGNOSIS — M1A9XX0 Chronic gout, unspecified, without tophus (tophi): Secondary | ICD-10-CM

## 2023-07-20 MED ORDER — GABAPENTIN (ONCE-DAILY) 300 MG PO TABS: 0.5000 | ORAL_TABLET | Freq: Every day | ORAL | 1 refills | Status: DC

## 2023-07-20 MED ORDER — TOPIRAMATE 50 MG PO TABS: 50.0000 mg | ORAL_TABLET | Freq: Every day | ORAL | 1 refills | Status: DC

## 2023-07-20 MED ORDER — OMEPRAZOLE 20 MG PO CPDR: 20.0000 mg | DELAYED_RELEASE_CAPSULE | Freq: Two times a day (BID) | ORAL | 0 refills | Status: DC

## 2023-07-20 NOTE — Progress Notes (Signed)
New patient visit   Patient: Faith Padilla   DOB: 01/31/1951   72 y.o. Female  MRN: [id number] Visit Date: 07/20/2023  Today's healthcare provider: Sherlyn Hay, DO   No chief complaint on file.  Subjective    Faith Padilla is a 72 y.o. female who presents today as a new patient to establish care.  HPI  The patient, recently relocated from [place] to [place], presents for establishing care after undergoing hip surgery on August 13th. She also has a history of anxiety and depression, for which she was previously receiving treatment in [place].  The patient reports a significant weight loss of 70 pounds prior to her hip surgery, facilitated by the use of topiramate. However, she has been experiencing a strong craving for sugar, particularly chocolate, since her surgery. She also reports frequent urination, which has improved since the completion of an antibiotic course for a urinary tract infection caused by E. Coli. Despite the improvement, the patient still experiences some incontinence, unable to reach the bathroom in time.  The patient has been experiencing neuropathic pain in her leg, described as a severe sunburn sensation. This pain is managed with gabapentin taken at bedtime, which also aids in sleep. She also takes melatonin to expedite the onset of sleep. The patient uses a CPAP machine for sleep apnea and reports consistent use every night.  The patient has a history of gout and was previously on allopurinol to manage uric acid levels. She also has a history of iron deficiency anemia and has received infusions in the past. The patient reports taking omeprazole twice daily for reflux, and she has been prescribed atorvastatin for cholesterol management.  The patient's hip surgery reportedly required additional support due to significant bone density loss. She was previously on Fosamax for osteoporosis but has not had a refill recently. The patient also reports a  history of pulmonary fibrosis following a bout of COVID-19 that resulted in a three-week hospital stay.   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   Diverticulitis    Dry eye syndrome of both eyes 08/13/2021   Dyspnea on exertion 08/13/2021   Family history of problems related to stress 11/15/2021   Frequency of urination and polyuria 06/25/2022   GAD (generalized anxiety disorder) 08/15/2021   Glaucoma    H/O urinary incontinence 06/25/2022   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   High risk medication use 08/02/2018   HLD (hyperlipidemia)    Iron deficiency 12/04/2019   Isolation (social) 12/07/2021   Kidney stones    Microscopic hematuria    Mixed incontinence    Morbid obesity (HCC)    Ocular hypertension, 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   Ocular migraine 08/02/2018   Osteoarthritis    lumbar spine   Palpitations 08/13/2021   Plantar fasciitis 06/25/2023   Post covid-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, 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   Postoperative follow-up 03/30/2020   Prediabetes 08/14/2015  Pseudophakia, both eyes 06/26/2016   Rectal bleeding 10/17/2021   Shortness of breath dyspnea    Sleep apnea    CPAP   Vaginal yeast infection    Wears dentures    partial upper   Past Surgical History:  Procedure Laterality Date   ABDOMINAL HYSTERECTOMY  09/08/1996   BACK SURGERY  1990/2014   CATARACT EXTRACTION, BILATERAL   09/08/2012   COLONOSCOPY WITH PROPOFOL N/A 03/25/2016   Procedure: COLONOSCOPY WITH PROPOFOL;  Surgeon: Midge Minium, MD;  Location: [facility] ENDOSCOPY;  Service: Endoscopy;  Laterality: N/A;   deviated nose septum surgery  09/08/1998   ESOPHAGOGASTRODUODENOSCOPY (EGD) WITH PROPOFOL N/A 03/25/2016   Procedure: ESOPHAGOGASTRODUODENOSCOPY (EGD) WITH PROPOFOL;  Surgeon: Midge Minium, MD;  Location: [facility] ENDOSCOPY;  Service: Endoscopy;  Laterality: N/A;   EYE SURGERY     LITHOTRIPSY  09/08/2010   TOTAL HIP ARTHROPLASTY Left    Family Status  Relation Name Status   Mother  Deceased   Father  Deceased   Sister  Alive   Daughter  (Not Specified)   Neg Hx  (Not Specified)  No partnership data on file   Family History  Problem Relation Age of Onset   Asthma Mother    Heart disease Mother    Diabetes Mother    Kidney disease Mother    Cancer Father        lung cancer   COPD Father    Liver cancer Father    Cancer Sister        Brain Cancer   Allergic rhinitis Sister    Asthma Sister    Hematuria Daughter    Bladder Cancer Neg Hx    Social History   Socioeconomic History   Marital status: Divorced    Spouse name: Not on file   Number of children: Not on file   Years of education: Not on file   Highest education level: Some college, no degree  Occupational History   Not on file  Tobacco Use   Smoking status: Never    Passive exposure: Never (Parent)   Smokeless tobacco: Never  Vaping Use   Vaping status: Never Used  Substance and Sexual Activity   Alcohol use: No   Drug use: No   Sexual activity: Never    Birth control/protection: Surgical  Other Topics Concern   Not on file  Social History Narrative   Not on file   Social Determinants of Health   Financial Resource Strain: Low Risk  (07/16/2023)   Overall Financial Resource Strain (CARDIA)    Difficulty of Paying Living Expenses: Not hard at all  Food Insecurity: No Food Insecurity (07/16/2023)   Hunger Vital Sign     Worried About Running Out of Food in the Last Year: Never true    Ran Out of Food in the Last Year: Never true  Transportation Needs: No Transportation Needs (07/16/2023)   PRAPARE - Administrator, Civil Service (Medical): No    Lack of Transportation (Non-Medical): No  Physical Activity: Unknown (07/16/2023)   Exercise Vital Sign    Days of Exercise per Week: 0 days    Minutes of Exercise per Session: Not on file  Stress: No Stress Concern Present (07/16/2023)   [employer] of Occupational Health - Occupational Stress Questionnaire    Feeling of Stress : Not at all  Social Connections: Unknown (07/16/2023)   Social Connection and Isolation Panel [NHANES]    Frequency of Communication with Friends and Family: More than  three times a week    Frequency of Social Gatherings with Friends and Family: Three times a week    Attends Religious Services: Patient declined    Active Member of Clubs or Organizations: No    Attends Engineer, structural: Not on file    Marital Status: Divorced   Outpatient Medications Prior to Visit  Medication Sig Note   albuterol (VENTOLIN HFA) 108 (90 Base) MCG/ACT inhaler Inhale 1-2 puffs into the lungs every 6 (six) hours as needed for wheezing or shortness of breath.    atorvastatin (LIPITOR) 20 MG tablet TAKE 1 TABLET(20 MG) BY MOUTH AT BEDTIME    Biotin 1 MG CAPS Take 1 capsule by mouth daily.    CALCIUM PO Take by mouth.    cetirizine (ZYRTEC) 10 MG tablet Take 1 tablet (10 mg total) by mouth at bedtime.    DULoxetine (CYMBALTA) 60 MG capsule Take 1 capsule (60 mg total) by mouth daily. Reported on 12/26/2015    fluticasone (FLONASE) 50 MCG/ACT nasal spray Place 2 sprays into both nostrils daily.    hydrOXYzine (ATARAX) 50 MG tablet Take by mouth.    levothyroxine (SYNTHROID) 75 MCG tablet Take 1 tablet (75 mcg total) by mouth daily before breakfast.    MAGNESIUM PO Take by mouth.    montelukast (SINGULAIR) 10 MG tablet Take 1 tablet  (10 mg total) by mouth at bedtime.    nystatin (MYCOSTATIN/NYSTOP) powder Apply 1 Application topically 3 (three) times daily.    Probiotic Product (PROBIOTIC PO) Take by mouth.    [DISCONTINUED] gabapentin (NEURONTIN) 300 MG capsule Take 300 mg by mouth 3 (three) times daily.    [DISCONTINUED] omeprazole (PRILOSEC) 20 MG capsule TAKE 1 CAPSULE(20 MG) BY MOUTH TWICE DAILY BEFORE A MEAL    [DISCONTINUED] topiramate (TOPAMAX) 50 MG tablet Take by mouth.    [DISCONTINUED] Cyanocobalamin (VITAMIN B-12 PO) Take by mouth.    [DISCONTINUED] solifenacin (VESICARE) 5 MG tablet Take 1 tablet (5 mg total) by mouth daily. 07/20/2023: dry mouth   [DISCONTINUED] Vitamin D, Ergocalciferol, (DRISDOL) 1.25 MG (50000 UNIT) CAPS capsule Take by mouth.    No facility-administered medications prior to visit.   Allergies  Allergen Reactions   Morphine Hives    Immunization History  Administered Date(s) Administered   Fluad Trivalent(High Dose 65+) 07/20/2023   Influenza, Mdck, Trivalent,PF 6+ MOS(egg free) 09/07/2013   Influenza, Seasonal, Injecte, Preservative Fre 08/22/2014, 05/14/2021   Influenza,inj,Quad PF,6+ Mos 10/03/2012, 08/14/2015   Influenza-Unspecified 10/03/2012, 09/07/2013, 08/22/2014   Moderna Sars-Covid-2 Vaccination 02/17/2020, 03/16/2020   Pneumococcal Polysaccharide-23 09/09/2011, 10/03/2012, 05/14/2021   Tdap 09/08/2004, 08/21/2011, 02/22/2021    Health Maintenance  Topic Date Due   Zoster Vaccines- Shingrix (1 of 2) Never done   DEXA SCAN  Never done   MAMMOGRAM  01/03/2017   Medicare Annual Wellness (AWV)  06/29/2018   COVID-19 Vaccine (3 - Moderna risk series) 04/13/2020   Pneumonia Vaccine 51+ Years old (2 of 2 - PCV) 05/14/2022   Colonoscopy  07/19/2026   DTaP/Tdap/Td (4 - Td or Tdap) 02/23/2031   INFLUENZA VACCINE  Completed   Hepatitis C Screening  Completed   HPV VACCINES  Aged Out    Patient Care Team: Berthold Glace, Monico Blitz, DO as PCP - General (Family  Medicine)      Objective    Pulse 70   Temp 98.6 F (37 C) (Oral)   Ht 5\' 1"  (1.549 m)   Wt 236 lb (107 kg)   SpO2 95%  BMI 44.59 kg/m     Physical Exam Vitals and nursing note reviewed.  Constitutional:      General: She is not in acute distress.    Appearance: Normal appearance.  HENT:     Head: Normocephalic and atraumatic.  Eyes:     General: No scleral icterus.    Conjunctiva/sclera: Conjunctivae normal.  Cardiovascular:     Rate and Rhythm: Normal rate.  Pulmonary:     Effort: Pulmonary effort is normal.  Neurological:     Mental Status: She is alert and oriented to person, place, and time. Mental status is at baseline.  Psychiatric:        Mood and Affect: Mood normal.        Behavior: Behavior normal.     Depression Screen    07/20/2023    9:52 AM 05/07/2023    3:25 PM 03/12/2016    8:59 AM 11/19/2015    3:15 PM  PHQ 2/9 Scores  PHQ - 2 Score 0 3 0 0  PHQ- 9 Score 0 13     Results for orders placed or performed in visit on 07/20/23  HM COLONOSCOPY  Result Value Ref Range   HM Colonoscopy See Report (in chart) See Report (in chart), Patient Reported  HM COLONOSCOPY  Result Value Ref Range   HM Colonoscopy See Report (in chart) See Report (in chart), Patient Reported  Results for orders placed or performed in visit on 07/20/23  CBC with Differential/Platelet  Result Value Ref Range   WBC 6.1 3.4 - 10.8 x10E3/uL   RBC 4.38 3.77 - 5.28 x10E6/uL   Hemoglobin 12.8 11.1 - 15.9 g/dL   Hematocrit 16.1 09.6 - 46.6 %   MCV 91 79 - 97 fL   MCH 29.2 26.6 - 33.0 pg   MCHC 32.0 31.5 - 35.7 g/dL   RDW 04.5 40.9 - 81.1 %   Platelets 364 150 - 450 x10E3/uL   Neutrophils 48 Not Estab. %   Lymphs 34 Not Estab. %   Monocytes 14 Not Estab. %   Eos 3 Not Estab. %   Basos 1 Not Estab. %   Neutrophils Absolute 2.9 1.4 - 7.0 x10E3/uL   Lymphocytes Absolute 2.1 0.7 - 3.1 x10E3/uL   Monocytes Absolute 0.8 0.1 - 0.9 x10E3/uL   EOS (ABSOLUTE) 0.2 0.0 - 0.4  x10E3/uL   Basophils Absolute 0.1 0.0 - 0.2 x10E3/uL   Immature Granulocytes 0 Not Estab. %   Immature Grans (Abs) 0.0 0.0 - 0.1 x10E3/uL  Vitamin B12  Result Value Ref Range   Vitamin B-12 1,439 (H) 232 - 1,245 pg/mL  Basic metabolic panel  Result Value Ref Range   Glucose 98 70 - 99 mg/dL   BUN 26 8 - 27 mg/dL   Creatinine, Ser 9.14 0.57 - 1.00 mg/dL   eGFR 60 >78 GN/FAO/1.30   BUN/Creatinine Ratio 26 12 - 28   Sodium 139 134 - 144 mmol/L   Potassium 5.5 (H) 3.5 - 5.2 mmol/L   Chloride 105 96 - 106 mmol/L   CO2 22 20 - 29 mmol/L   Calcium 9.8 8.7 - 10.3 mg/dL  Iron, TIBC and Ferritin Panel  Result Value Ref Range   Total Iron Binding Capacity 392 250 - 450 ug/dL   UIBC 865 784 - 696 ug/dL   Iron 39 27 - 295 ug/dL   Iron Saturation 10 (L) 15 - 55 %   Ferritin 25 15 - 150 ng/mL  HCV Ab w Reflex to [employer]  PCR  Result Value Ref Range   HCV Ab Non Reactive Non Reactive  Interpretation:  Result Value Ref Range   HCV Interp 1: Comment     Assessment & Plan     Hyperlipidemia, unspecified hyperlipidemia type  Establishing care with new doctor, encounter for  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 -Continue Flonase as needed.   Pulmonary fibrosis (HCC)  Encounter for weight loss counseling Assessment & Plan: -Continue topiramate for weight management, assess effectiveness at next visit.  Orders: -     Topiramate; Take 1 tablet (50 mg total) by mouth daily.  Dispense: 90 tablet; Refill: 1  Gastroesophageal reflux disease, unspecified whether esophagitis present Assessment & Plan: >>ASSESSMENT AND PLAN FOR ACID REFLUX WRITTEN ON 08/03/2023  9:25 PM BY Doneen Ollinger N, DO  -Refill omeprazole, advise taking 30 minutes before meals.  Orders: -     Omeprazole; Take 1 capsule (20 mg total) by mouth 2 (two) times daily before a meal.  Dispense: 180  capsule; Refill: 0  Fibromyalgia Assessment & Plan: Neuropathic pain in leg, managed with gabapentin 150mg  at bedtime. -Continue gabapentin as needed for pain.   Orders: -     Gabapentin (Once-Daily); Take 0.5 tablets (150 mg total) by mouth at bedtime.  Dispense: 45 tablet; Refill: 1  Hypothyroidism, unspecified type Assessment & Plan: -Continue levothyroxine for hypothyroidism, ensure taking in the morning.   Age-related osteoporosis without current pathological fracture Assessment & Plan: History of osteoporosis, recent hip surgery required additional support due to bone density loss. -Order bone density test to assess current status. -Consider restarting Fosamax pending results of bone density test.  Orders: -     DG Bone Density; Future -     Ambulatory referral to Psychiatry  Depression, major, single episode, in partial remission (HCC) -     Ambulatory referral to Psychiatry  Chronic kidney disease, stage 2, mildly decreased GFR  Encounter for screening mammogram for breast cancer -     3D Screening Mammogram, Left and Right; Future  Prediabetes Assessment & Plan: Recent craving for sugar, history of prediabetes with last A1C at 5.9. -Order A1C on 08/07/2023. -Encourage return to diet high in salads and protein, low in sugar.  Orders: -     Hemoglobin A1c  Thrombocytosis -     CBC with Differential/Platelet  Iron deficiency anemia secondary to inadequate dietary iron intake -     Iron, TIBC and Ferritin Panel -     Ferritin  Elevated vitamin B12 level -     Vitamin B12  Hypercalcemia -     Basic metabolic panel  Encounter for hepatitis C screening test for low risk patient -     HCV Ab w Reflex to Quant PCR -     Interpretation:  Need for influenza vaccination -     Flu Vaccine Trivalent High Dose (Fluad)  Chronic gout without tophus, unspecified cause, unspecified site Assessment & Plan: History of high uric acid, previously managed with  allopurinol. -Consider restarting allopurinol    Recent Urinary Tract Infection Recent E. Coli UTI treated with antibiotics. Currently asymptomatic. Discussed proper hygiene techniques to prevent future UTIs. -Continue current hygiene practices, including use of wet wipes and ensuring dryness after.  General Health Maintenance -Order mammogram, last one unknown. -Colonoscopy done last year; will request records. -Stop magnesium due to loose stools. -Continue Fibercon as directed by gastroenterologist.  Return in about  3 months (around 10/20/2023) for CPE.     I discussed the assessment and treatment plan with the patient  The patient was provided an opportunity to ask questions and all were answered. The patient agreed with the plan and demonstrated an understanding of the instructions.   The patient was advised to call back or seek an in-person evaluation if the symptoms worsen or if the condition fails to improve as anticipated.  Total time was 60 minutes. That includes chart review before the visit, the actual patient visit, and time spent on documentation after the visit.    Sherlyn Hay, DO  [facility] [phone] (phone) [phone] (fax)  [facility]

## 2023-07-20 NOTE — Patient Instructions (Signed)
Stop magnesium

## 2023-07-21 LAB — CBC WITH DIFFERENTIAL/PLATELET
Basophils Absolute: 0.1 10*3/uL (ref 0.0–0.2)
Basos: 1 %
EOS (ABSOLUTE): 0.2 10*3/uL (ref 0.0–0.4)
Eos: 3 %
Hematocrit: 40 % (ref 34.0–46.6)
Hemoglobin: 12.8 g/dL (ref 11.1–15.9)
Immature Grans (Abs): 0 10*3/uL (ref 0.0–0.1)
Immature Granulocytes: 0 %
Lymphocytes Absolute: 2.1 10*3/uL (ref 0.7–3.1)
Lymphs: 34 %
MCH: 29.2 pg (ref 26.6–33.0)
MCHC: 32 g/dL (ref 31.5–35.7)
MCV: 91 fL (ref 79–97)
Monocytes Absolute: 0.8 10*3/uL (ref 0.1–0.9)
Monocytes: 14 %
Neutrophils Absolute: 2.9 10*3/uL (ref 1.4–7.0)
Neutrophils: 48 %
Platelets: 364 10*3/uL (ref 150–450)
RBC: 4.38 x10E6/uL (ref 3.77–5.28)
RDW: 12.9 % (ref 11.7–15.4)
WBC: 6.1 10*3/uL (ref 3.4–10.8)

## 2023-07-21 LAB — ALLERGENS W/TOTAL IGE AREA 2
Alternaria Alternata IgE: <0.1 kU/L
Aspergillus Fumigatus IgE: <0.1 kU/L
Bermuda Grass IgE: <0.1 kU/L
Cat Dander IgE: <0.1 kU/L
Cedar, Mountain IgE: <0.1 kU/L
Cladosporium Herbarum IgE: <0.1 kU/L
Cockroach, German IgE: 0.13 kU/L — AB
Common Silver Birch IgE: <0.1 kU/L
Cottonwood IgE: <0.1 kU/L
D Farinae IgE: <0.1 kU/L
D Pteronyssinus IgE: <0.1 kU/L
Dog Dander IgE: <0.1 kU/L
Elm, American IgE: <0.1 kU/L
IgE (Immunoglobulin E), Serum: 61 [IU]/mL (ref 6–495)
Johnson Grass IgE: <0.1 kU/L
Maple/Box Elder IgE: <0.1 kU/L
Mouse Urine IgE: <0.1 kU/L
Oak, White IgE: <0.1 kU/L
Pecan, Hickory IgE: <0.1 kU/L
Penicillium Chrysogen IgE: <0.1 kU/L
Pigweed, Rough IgE: <0.1 kU/L
Ragweed, Short IgE: <0.1 kU/L
Sheep Sorrel IgE Qn: <0.1 kU/L
Timothy Grass IgE: <0.1 kU/L
White Mulberry IgE: <0.1 kU/L

## 2023-07-21 LAB — BASIC METABOLIC PANEL
BUN/Creatinine Ratio: 26 (ref 12–28)
BUN: 26 mg/dL (ref 8–27)
CO2: 22 mmol/L (ref 20–29)
Calcium: 9.8 mg/dL (ref 8.7–10.3)
Chloride: 105 mmol/L (ref 96–106)
Creatinine, Ser: 1 mg/dL (ref 0.57–1.00)
Glucose: 98 mg/dL (ref 70–99)
Potassium: 5.5 mmol/L — ABNORMAL HIGH (ref 3.5–5.2)
Sodium: 139 mmol/L (ref 134–144)
eGFR: 60 mL/min/{1.73_m2} (ref >59)

## 2023-07-21 LAB — HCV INTERPRETATION

## 2023-07-21 LAB — IRON,TIBC AND FERRITIN PANEL
Ferritin: 25 ng/mL (ref 15–150)
Iron Saturation: 10 % — ABNORMAL LOW (ref 15–55)
Iron: 39 ug/dL (ref 27–139)
Total Iron Binding Capacity: 392 ug/dL (ref 250–450)
UIBC: 353 ug/dL (ref 118–369)

## 2023-07-21 LAB — VITAMIN B12: Vitamin B-12: 1439 pg/mL — ABNORMAL HIGH (ref 232–1245)

## 2023-07-21 LAB — HCV AB W REFLEX TO QUANT PCR: HCV Ab: NONREACTIVE

## 2023-07-27 ENCOUNTER — Ambulatory Visit: Payer: 59 | Admitting: Urology

## 2023-07-27 ENCOUNTER — Encounter: Payer: Self-pay | Admitting: Urology

## 2023-07-27 VITALS — Ht 61.0 in | Wt 233.0 lb

## 2023-07-27 DIAGNOSIS — N3941 Urge incontinence: Secondary | ICD-10-CM

## 2023-07-27 DIAGNOSIS — Z87440 Personal history of urinary (tract) infections: Secondary | ICD-10-CM

## 2023-07-27 DIAGNOSIS — N3020 Other chronic cystitis without hematuria: Secondary | ICD-10-CM

## 2023-07-27 LAB — URINALYSIS, COMPLETE
Bilirubin, UA: NEGATIVE
Glucose, UA: NEGATIVE
Ketones, UA: NEGATIVE
Nitrite, UA: NEGATIVE
Protein,UA: NEGATIVE
Specific Gravity, UA: 1.025 (ref 1.005–1.030)
Urobilinogen, Ur: 0.2 mg/dL (ref 0.2–1.0)
pH, UA: 5.5 (ref 5.0–7.5)

## 2023-07-27 LAB — MICROSCOPIC EXAMINATION: WBC, UA: >30 /[HPF] — AB (ref 0–5)

## 2023-07-27 MED ORDER — CIPROFLOXACIN HCL 250 MG PO TABS: 250.0000 mg | ORAL_TABLET | Freq: Two times a day (BID) | ORAL | 0 refills | Status: DC

## 2023-07-27 MED ORDER — TRIMETHOPRIM 100 MG PO TABS: 100.0000 mg | ORAL_TABLET | Freq: Every day | ORAL | 11 refills | Status: DC

## 2023-07-27 NOTE — Progress Notes (Signed)
07/27/2023 2:48 PM   Faith Padilla Nov 19, 1950 [id number]  Referring provider: Alyson Reedy, FNP [address] [place],  [place] [address]  Chief Complaint  Patient presents with   Cysto    HPI: Patient last saw nurse practitioner 7 years ago was on Vesicare.  Had some prolapse symptoms.  Estrogen cream provided.   Today Patient has urge incontinence but does not leak with coughing sneezing.  No bedwetting.  3 heavier briefs a day that are soaked.  Voids every 1 hour and cannot hold it for 2 hours.  Voids hourly at night.  No ankle edema.  No diuretic   Has had 2 low back operations and hysterectomy   Recently went on Vesicare and had dry mouth and retention symptoms   On further questioning patient was on Myrbetriq for years and she said it did not work.  In the last 6 months she says she has some suprapubic discomfort when she is full and during urination.  It is relieved when she voids    Patient has an overactive bladder with urge incontinence.  She has frequency and significant nocturia.  Reassess in 6 weeks on Gemtesa samples and prescription for pelvic examination and cystoscopy.  Call if culture positive.  She understands that a bladder infection would explain the discomfort but otherwise I will be performing cystoscopy for incontinence and discomfort with urination   Today Frequency stable.  Last culture positive. The patient said when I treated the bladder infection her frequency got a lot at her but she was still leaking some.  She said the [place] did not help her.  She says sometimes urine can be cloudy or foul-smelling but does not give a strong history of recurrent UTIs  On pelvic examination she had a well supported bladder neck and no prolapse or stress incontinence with a moderate cough after cystoscopy Cystoscopy: Patient underwent flexible cystoscopy.  Urine was cloudy with a lot of white flecks.  Bladder urothelium within normal limits but she  may have had some mild findings of cystitis cystica.  No carcinoma.    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   Diverticulitis    Dry eye syndrome of both eyes 08/13/2021   Dyspnea on exertion 08/13/2021   Family history of problems related to stress 11/15/2021   Frequency of urination and polyuria 06/25/2022   GAD (generalized anxiety disorder) 08/15/2021   Glaucoma    H/O urinary incontinence 06/25/2022   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   High risk medication use 08/02/2018   HLD (hyperlipidemia)    Iron deficiency 12/04/2019   Isolation (social) 12/07/2021   Kidney stones    Microscopic hematuria    Mixed incontinence    Morbid obesity (HCC)    Ocular hypertension, 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   Ocular migraine 08/02/2018   Osteoarthritis    lumbar spine   Palpitations 08/13/2021  Plantar fasciitis 06/25/2023   Post covid-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, 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   Postoperative follow-up 03/30/2020   Prediabetes 06/13/2016   Pseudophakia, both eyes 06/26/2016   Rectal bleeding 10/17/2021   Shortness of breath dyspnea    Sleep apnea    CPAP   Vaginal yeast infection    Wears dentures    partial upper    Surgical History: Past Surgical History:  Procedure Laterality Date   ABDOMINAL HYSTERECTOMY  09/08/1996   BACK SURGERY  1990/2014   CATARACT EXTRACTION, BILATERAL  09/08/2012    COLONOSCOPY WITH PROPOFOL N/A 03/25/2016   Procedure: COLONOSCOPY WITH PROPOFOL;  Surgeon: Midge Minium, MD;  Location: [facility] ENDOSCOPY;  Service: Endoscopy;  Laterality: N/A;   deviated nose septum surgery  09/08/1998   ESOPHAGOGASTRODUODENOSCOPY (EGD) WITH PROPOFOL N/A 03/25/2016   Procedure: ESOPHAGOGASTRODUODENOSCOPY (EGD) WITH PROPOFOL;  Surgeon: Midge Minium, MD;  Location: [facility] ENDOSCOPY;  Service: Endoscopy;  Laterality: N/A;   EYE SURGERY     LITHOTRIPSY  09/08/2010   TOTAL HIP ARTHROPLASTY Left     Home Medications:  Allergies as of 07/27/2023       Reactions   Morphine Hives        Medication List        Accurate as of July 27, 2023  2:48 PM. If you have any questions, ask your nurse or doctor.          STOP taking these medications    VITAMIN B-12 PO Stopped by: Lorin Picket A Robyn Nohr       TAKE these medications    albuterol 108 (90 Base) MCG/ACT inhaler Commonly known as: VENTOLIN HFA Inhale 1-2 puffs into the lungs every 6 (six) hours as needed for wheezing or shortness of breath.   atorvastatin 20 MG tablet Commonly known as: LIPITOR TAKE 1 TABLET(20 MG) BY MOUTH AT BEDTIME   Biotin 1 MG Caps Take 1 capsule by mouth daily.   CALCIUM PO Take by mouth.   cetirizine 10 MG tablet Commonly known as: ZYRTEC Take 1 tablet (10 mg total) by mouth at bedtime.   DULoxetine 60 MG capsule Commonly known as: CYMBALTA Take 1 capsule (60 mg total) by mouth daily. Reported on 12/26/2015   fluticasone 50 MCG/ACT nasal spray Commonly known as: FLONASE Place 2 sprays into both nostrils daily.   Gabapentin (Once-Daily) 300 MG Tabs Take 0.5 tablets (150 mg total) by mouth at bedtime.   hydrOXYzine 50 MG tablet Commonly known as: ATARAX Take by mouth.   levothyroxine 75 MCG tablet Commonly known as: SYNTHROID Take 1 tablet (75 mcg total) by mouth daily before breakfast.   MAGNESIUM PO Take by mouth.   montelukast 10 MG tablet Commonly known as:  SINGULAIR Take 1 tablet (10 mg total) by mouth at bedtime.   nystatin powder Commonly known as: MYCOSTATIN/NYSTOP Apply 1 Application topically 3 (three) times daily.   omeprazole 20 MG capsule Commonly known as: PRILOSEC Take 1 capsule (20 mg total) by mouth 2 (two) times daily before a meal.   PROBIOTIC PO Take by mouth.   topiramate 50 MG tablet Commonly known as: TOPAMAX Take 1 tablet (50 mg total) by mouth daily.        Allergies:  Allergies  Allergen Reactions   Morphine Hives    Family History: Family History  Problem Relation Age of Onset   Asthma Mother    Heart disease Mother    Diabetes Mother    Kidney disease Mother    Cancer Father  lung cancer   COPD Father    Liver cancer Father    Cancer Sister        Brain Cancer   Allergic rhinitis Sister    Asthma Sister    Hematuria Daughter    Bladder Cancer Neg Hx     Social History:  reports that she has never smoked. She has never been exposed to tobacco smoke. She has never used smokeless tobacco. She reports that she does not drink alcohol and does not use drugs.  ROS:                                        Physical Exam: There were no vitals taken for this visit.  Constitutional:  Alert and oriented, No acute distress. HEENT: [place] AT, moist mucus membranes.  Trachea midline, no masses.   Laboratory Data: Lab Results  Component Value Date   WBC 6.1 07/20/2023   HGB 12.8 07/20/2023   HCT 40.0 07/20/2023   MCV 91 07/20/2023   PLT 364 07/20/2023    Lab Results  Component Value Date   CREATININE 1.00 07/20/2023    No results found for: "PSA"  No results found for: "TESTOSTERONE"  Lab Results  Component Value Date   HGBA1C 5.9 (H) 05/07/2023    Urinalysis    Component Value Date/Time   COLORURINE YELLOW (A) 03/04/2016 1920   APPEARANCEUR Cloudy (A) 06/15/2023 1442   LABSPEC 1.014 03/04/2016 1920   LABSPEC 1.014 07/24/2014 1519   PHURINE 7.0  03/04/2016 1920   GLUCOSEU Negative 06/15/2023 1442   GLUCOSEU Negative 07/24/2014 1519   HGBUR NEGATIVE 03/04/2016 1920   BILIRUBINUR Negative 06/15/2023 1442   BILIRUBINUR Negative 07/24/2014 1519   KETONESUR NEGATIVE 03/04/2016 1920   PROTEINUR 1+ (A) 06/15/2023 1442   PROTEINUR NEGATIVE 03/04/2016 1920   UROBILINOGEN negative 12/06/2015 1448   NITRITE Positive (A) 06/15/2023 1442   NITRITE NEGATIVE 03/04/2016 1920   LEUKOCYTESUR 3+ (A) 06/15/2023 1442   LEUKOCYTESUR 2+ 07/24/2014 1519    Pertinent Imaging: Urine positive and sent for culture  Assessment & Plan: I would like to send in ciprofloxacin 250 mg twice a day for 7 days and have her come back in 6 to 8 weeks on daily trimethoprim.  I am hoping this will down regulate some of her incontinence.  It may explain why some of the medications have not helped her.  Another trial of an antimuscarinic may be prudent.  Third line therapies are options.  She has not had urodynamics.  There are no diagnoses linked to this encounter.  No follow-ups on file.  Martina Sinner, MD  [facility] [address], [address] [place], [place] [address] [phone]

## 2023-07-30 ENCOUNTER — Encounter (HOSPITAL_BASED_OUTPATIENT_CLINIC_OR_DEPARTMENT_OTHER): Payer: Self-pay | Admitting: Family Medicine

## 2023-08-02 LAB — CULTURE, URINE COMPREHENSIVE

## 2023-08-03 ENCOUNTER — Encounter: Payer: Self-pay | Admitting: Family Medicine

## 2023-08-03 ENCOUNTER — Telehealth: Payer: Self-pay | Admitting: *Deleted

## 2023-08-03 DIAGNOSIS — Z713 Dietary counseling and surveillance: Secondary | ICD-10-CM | POA: Insufficient documentation

## 2023-08-03 DIAGNOSIS — M1A9XX0 Chronic gout, unspecified, without tophus (tophi): Secondary | ICD-10-CM | POA: Insufficient documentation

## 2023-08-03 MED ORDER — AMOXICILLIN 500 MG PO CAPS: 500.0000 mg | ORAL_CAPSULE | Freq: Three times a day (TID) | ORAL | 0 refills | Status: AC

## 2023-08-03 NOTE — Assessment & Plan Note (Signed)
>>  ASSESSMENT AND PLAN FOR ACID REFLUX WRITTEN ON 08/03/2023  9:25 PM BY Makaela Cando N, DO  -Refill omeprazole, advise taking 30 minutes before meals.

## 2023-08-03 NOTE — Assessment & Plan Note (Signed)
-  Refill omeprazole, advise taking 30 minutes before meals.

## 2023-08-03 NOTE — Telephone Encounter (Signed)
.  left message to have patient return my call.  

## 2023-08-03 NOTE — Assessment & Plan Note (Signed)
-  Continue topiramate for weight management, assess effectiveness at next visit.

## 2023-08-03 NOTE — Assessment & Plan Note (Signed)
-  Continue levothyroxine for hypothyroidism, ensure taking in the morning.

## 2023-08-03 NOTE — Telephone Encounter (Signed)
-----   Message from [place] A Macdiarmid sent at 08/03/2023  8:14 AM EST ----- The patient is urine culture was resistant to the ciprofloxacin.  She likely is just finishing the Cipro.  Take amoxicillin 500 mg 3 times a day for 7 days and then start daily trimethoprim as previously ordered ----- Message ----- From: Interface, [facility] Lab Results In Sent: 07/27/2023   4:36 PM EST To: Alfredo Martinez, MD

## 2023-08-03 NOTE — Addendum Note (Signed)
Addended by: Jacquenette Shone on: 08/03/2023 09:39 PM   Modules accepted: Level of Service

## 2023-08-03 NOTE — Assessment & Plan Note (Addendum)
-  Continue albuterol with spacer as needed -Continue Singulair daily. -Continue Flonase as needed.

## 2023-08-03 NOTE — Assessment & Plan Note (Signed)
History of high uric acid, previously managed with allopurinol. -Consider restarting allopurinol

## 2023-08-03 NOTE — Assessment & Plan Note (Signed)
History of osteoporosis, recent hip surgery required additional support due to bone density loss. -Order bone density test to assess current status. -Consider restarting Fosamax pending results of bone density test.

## 2023-08-03 NOTE — Telephone Encounter (Signed)
Spoke with patient and advised results rx sent to pharmacy by e-script

## 2023-08-03 NOTE — Assessment & Plan Note (Signed)
Neuropathic pain in leg, managed with gabapentin 150mg  at bedtime. -Continue gabapentin as needed for pain.

## 2023-08-03 NOTE — Assessment & Plan Note (Signed)
Recent craving for sugar, history of prediabetes with last A1C at 5.9. -Order A1C on 08/07/2023. -Encourage return to diet high in salads and protein, low in sugar.

## 2023-08-11 ENCOUNTER — Encounter (HOSPITAL_BASED_OUTPATIENT_CLINIC_OR_DEPARTMENT_OTHER): Payer: Self-pay | Admitting: Pulmonary Disease

## 2023-08-11 ENCOUNTER — Ambulatory Visit (HOSPITAL_BASED_OUTPATIENT_CLINIC_OR_DEPARTMENT_OTHER): Payer: 59 | Admitting: Pulmonary Disease

## 2023-08-11 VITALS — BP 118/70 | HR 80 | Resp 14 | Ht 61.0 in | Wt 230.8 lb

## 2023-08-11 DIAGNOSIS — Z8616 Personal history of COVID-19: Secondary | ICD-10-CM | POA: Diagnosis not present

## 2023-08-11 DIAGNOSIS — J8410 Pulmonary fibrosis, unspecified: Secondary | ICD-10-CM | POA: Diagnosis not present

## 2023-08-11 DIAGNOSIS — J9611 Chronic respiratory failure with hypoxia: Secondary | ICD-10-CM | POA: Diagnosis not present

## 2023-08-11 NOTE — Progress Notes (Addendum)
Subjective:   PATIENT ID: Faith Padilla GENDER: female DOB: 1951-06-20, MRN: [id number]  Chief Complaint  Patient presents with   Consult    Pulmonary fibrosis    Reason for Visit: New consult for pulmonary fibrosis  Ms. Faith Padilla is a 72 year old female never smoker with OSA on CPAP, bronchiectasis, HTN, HLD, DM2, hypothyroidism, GERD who presents for management of pulmonary fibrosis.  She has a documented history of pulmonary fibrosis with ?related to post-covid. However no recent dedicated lung imaging available. CT A/P from 04/05/22 comments on small areas of parenchymal scarring at the lung base on report. Her recent spirometry in October demonstrates no obstructive defect but no lung volumes or DLCO available.  She was hospitalized in December 2020 with covid for three weeks. Discharged on oxygen which was discontinued after 3 months. Previously on nebulizers (brovana, atrovent) but no longer needs it. She was followed by Dr. Carlynn Purl in Pulmonary clinic in [place]. Reports prior PFTs. Does not currently need maintenance inhalers, previously on Spiriva and Breztri at different times. Takes montelukast and cetrizine. Reports occasional coughing from allergies. 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   She has OSA and wear CPAP nightly. Reports history of failing extubation post-lithotripsy before covid  Addendum: Faxed records sent - [facility] 12/24/22 -Seen for Bronchiectasis, chronic respiratoyr failure, OSA (AHI 10.8/h), post-inflammatory pulmonary fibrosis -PFTs 12/22/22 FVC 2.27 (84%) FEV1 1.97 (94%) Ratio 87  TLC 4.32 (88%) DLCO 88% Interpretation: Normal PFTs -On ventolin PRN. No comment on active oxygen needs. Compliant with CPAP 8-16 cm H20 -Last bronchiectasis exacerbation in 02/2022. Tried Breztri sample, prednisone, doxycycline. Prior exacerbations in 09/2021, 08/2021, 02/2021 as well.  -Documented fungal infection  of lung (no organism noted on records) treated with itraconazole for from 03/2021-07/2021. Bronchoscopy at [facility] 03/06/21 for cough. Report commented on dynamic collapse throughout tracheobronchial tree. Results on the noted with neg cultures including neg fungal but yeast (not cryptococcus species. "Neg for malignancy. Reactive bronchial epithelial cells admixed with alveolar macrophages and squamous cells. Mucoinflammatory debris with acute inflammatory cells" Unclear if itraconazole helped based on note from 07/22/21.   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   Diverticulitis    Dry eye syndrome of both eyes 08/13/2021   Dyspnea on exertion 08/13/2021   Family history of problems related to stress 11/15/2021   Frequency of urination and polyuria 06/25/2022   GAD (generalized anxiety disorder) 08/15/2021   Glaucoma    H/O urinary incontinence 06/25/2022   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   High risk medication use 08/02/2018   HLD (hyperlipidemia)    Iron deficiency 12/04/2019   Isolation (social) 12/07/2021   Kidney stones    Microscopic hematuria    Mixed incontinence    Morbid obesity (HCC)    Ocular hypertension, bilateral  06/26/2016   Ocular migraine 08/02/2018   Osteoarthritis    lumbar spine   Palpitations 08/13/2021   Plantar fasciitis  06/25/2023   Post covid-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, 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   Postoperative follow-up 03/30/2020   Prediabetes 08/14/2015   Pseudophakia, both eyes 06/26/2016   Rectal bleeding 10/17/2021   Shortness of breath dyspnea    Sleep apnea    CPAP   Vaginal yeast infection    Wears dentures    partial upper     Family History  Problem Relation Age of Onset   Asthma Mother    Heart disease Mother    Diabetes Mother    Kidney disease Mother    Cancer Father        lung cancer   COPD Father    Liver cancer Father    Cancer Sister        Brain Cancer   Allergic rhinitis Sister    Asthma Sister    Hematuria Daughter    Bladder Cancer Neg Hx      Social History   Occupational History   Not on file  Tobacco Use   Smoking status: Never    Passive exposure: Never (Parent)   Smokeless tobacco: Never  Vaping Use   Vaping status: Never Used  Substance and Sexual Activity   Alcohol use: No   Drug use: No   Sexual activity: Never    Birth control/protection: Surgical    Allergies  Allergen Reactions   Morphine Hives     Outpatient Medications Prior to Visit  Medication Sig Dispense Refill   albuterol (VENTOLIN HFA) 108 (90 Base) MCG/ACT inhaler Inhale 1-2 puffs into the lungs every 6 (six) hours as needed for wheezing or shortness of breath. 18 g 1   atorvastatin (LIPITOR) 20 MG tablet TAKE 1 TABLET(20 MG) BY MOUTH AT BEDTIME 90 tablet 0   Biotin 1 MG CAPS Take 1 capsule by mouth daily.     CALCIUM PO Take by mouth.     cetirizine (ZYRTEC) 10 MG tablet Take 1 tablet (10 mg total) by mouth at bedtime. 30 tablet 3   DULoxetine (CYMBALTA) 60 MG capsule Take 1 capsule (60 mg total) by mouth daily. Reported on 12/26/2015 90 capsule 3   fluticasone (FLONASE) 50 MCG/ACT nasal spray Place 2 sprays into both nostrils daily. 16 g 5   Gabapentin, Once-Daily, 300 MG TABS Take 0.5 tablets (150 mg total) by mouth at bedtime. 45 tablet 1   hydrOXYzine (ATARAX) 50 MG tablet Take by mouth.      levothyroxine (SYNTHROID) 75 MCG tablet Take 1 tablet (75 mcg total) by mouth daily before breakfast. 90 tablet 3   montelukast (SINGULAIR) 10 MG tablet Take 1 tablet (10 mg total) by mouth at bedtime. 30 tablet 5   nystatin (MYCOSTATIN/NYSTOP) powder Apply 1 Application topically 3 (three) times daily. 60 g 3   omeprazole (PRILOSEC) 20 MG capsule Take 1 capsule (20 mg total) by mouth 2 (two) times daily before a meal. 180 capsule 0   Probiotic Product (PROBIOTIC PO) Take by mouth.     topiramate (TOPAMAX) 50 MG tablet Take 1 tablet (50 mg total) by mouth daily. 90 tablet 1   trimethoprim (TRIMPEX) 100 MG tablet Take 1 tablet (100 mg total) by mouth daily. 30 tablet 11   MAGNESIUM PO Take by mouth.     No facility-administered medications prior to visit.    ROS   Objective:   Vitals:   08/11/23 1446  BP: 118/70  Pulse: 80  Resp: 14  SpO2: 97%  Weight: 230 lb 12.8 oz (104.7 kg)  Height: 5\' 1"  (1.549 m)   SpO2: 97 % FiO2 (%): 98 %  Physical Exam: General: Well-appearing, no acute distress HENT: [place], AT Eyes: EOMI, no scleral icterus Respiratory: Clear to auscultation bilaterally.  No crackles, wheezing or rales Cardiovascular: RRR, -M/R/G, no JVD Extremities:-Edema,-tenderness Neuro: AAO x4, CNII-XII grossly intact Psych: Normal mood, normal affect  Data Reviewed:  Imaging: No recent dedicated lung imaging  CXR 12/12/20 (report only from [facility]) Reticulonodular opacities of the lungs, unchanged from comparison CXR compatible with hx of prior COVID. Hiatal hernia  CT Chest 02/19/21 (report only from [facility]) Pertinent findings: Patchy areas of ground glass, septal thickening, thickening of the peribronchovascular interstitium, regional  architectural distortion and volume loss are again noted scattered throughout the lungs bilaterally with no discernible craniocadual gradient. Findings are essentially unchanged compared to prior study from 07/31/20.  No honeycombing is identified. Inspiratory and expiratory imaging demonstrates some mild air trapping indicative of mild small airways disease. No acute consolidative airspace disease. No pleural effusions. No definite suspicious nodules or masses. 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 Rounded opacity at the lung base likely reflects hiatal hernia. Although less likely, diaphragmatic hernia is not completed excluded. Conclusion: Bilateral pneumonia  PFT:  12/22/22 [facility] FVC 2.39 (89%) FEV1 2.02 (94%) Ratio 87  TLC 88% DLCO 88% Interpretation: Normal  07/08/23 FVC 2.44 (98%) FEV1 1.89 (98%) Ratio 77   Interpretation: Normal spirometry  Labs:    Latest Ref Rng & Units 07/20/2023   10:51 AM 05/07/2023    4:13 PM 03/12/2016   10:01 AM  CBC  WBC 3.4 - 10.8 x10E3/uL 6.1  12.7  11.6   Hemoglobin 11.1 - 15.9 g/dL 91.4  78.2  95.6   Hematocrit 34.0 - 46.6 % 40.0  39.0  39.4   Platelets 150 - 450 x10E3/uL 364  576  409    Ambulatory O2 08/11/23 Jama Flavors, CMA Tue Aug 11, 2023  3:37 PM   O2 on room air: 94% O2 on ambulation room air: 79% O2 on ambulation with 2 L Oxygen: 98% Patient qualified for oxygen in office due to desat on exertion.    Assessment & Plan:   Discussion: 72 year old female never smoker with OSA on CPAP, bronchiectasis, HTN, HLD, DM2, hypothyroidism, GERD who presents for management of pulmonary fibrosis. Reviewed history via available records and patient history. Ambulatory O2 with desaturations.  Post-covid pulmonary fibrosis Chronic hypoxemic respiratory failure --ORDER CT Chest without contrast --ORDER pulmonary function test --Ambulatory O2 with desaturations  Will order 2L via [place] with continuous POC with activity --Refer to pulmonary rehab  Health  Maintenance Immunization History  Administered Date(s) Administered   Fluad Trivalent(High Dose 65+) 07/20/2023   Influenza, Mdck, Trivalent,PF 6+ MOS(egg free) 09/07/2013   Influenza, Seasonal, Injecte, Preservative Fre 08/22/2014, 05/14/2021   Influenza,inj,Quad PF,6+ Mos 10/03/2012, 08/14/2015   Influenza-Unspecified 10/03/2012, 09/07/2013, 08/22/2014   Moderna Sars-Covid-2 Vaccination 02/17/2020, 03/16/2020   Pneumococcal Polysaccharide-23 09/09/2011, 10/03/2012, 05/14/2021   Tdap 09/08/2004, 08/21/2011, 02/22/2021   CT Lung Screen - not qualified  Orders Placed This Encounter  Procedures   CT Chest Wo Contrast    Standing Status:   Future    Standing Expiration Date:   08/10/2024  Order Specific Question:   Preferred imaging location?    Answer:   [facility]   AMB referral to pulmonary rehabilitation    Referral Priority:   Routine    Referral Type:   Consultation    Number of Visits Requested:   1   Pulmonary function test    Standing Status:   Future    Standing Expiration Date:   08/10/2024    Order Specific Question:   Where should this test be performed?    Answer:   [facility]    Order Specific Question:   Full PFT: includes the following: basic spirometry, spirometry pre & post bronchodilator, diffusion capacity (DLCO), lung volumes    Answer:   Full PFT  No orders of the defined types were placed in this encounter.   Return in about 2 months (around 10/12/2023) for OK to schedule PFT on separate.  I have spent a total time of 45-minutes on the day of the appointment reviewing prior documentation, coordinating care and discussing medical diagnosis and plan with the patient/family. Imaging, labs and tests included in this note have been reviewed and interpreted independently by me.  Landy Dunnavant Mechele Collin, MD [facility] 08/11/2023 3:47 PM

## 2023-08-11 NOTE — Patient Instructions (Addendum)
Post-covid pulmonary fibrosis Chronic hypoxemic respiratory failure --ORDER CT Chest without contrast --ORDER pulmonary function test --Ambulatory O2 with desaturations  Will order 2L via [place] with continuous POC  --Refer to pulmonary rehab

## 2023-08-18 ENCOUNTER — Ambulatory Visit (HOSPITAL_BASED_OUTPATIENT_CLINIC_OR_DEPARTMENT_OTHER)
Admission: RE | Admit: 2023-08-18 | Discharge: 2023-08-18 | Disposition: A | Discharge status: Home / Self Care | Payer: 59 | Source: Ambulatory Visit | Attending: Pulmonary Disease | Admitting: Pulmonary Disease

## 2023-08-18 DIAGNOSIS — J8410 Pulmonary fibrosis, unspecified: Secondary | ICD-10-CM | POA: Diagnosis present

## 2023-08-24 ENCOUNTER — Telehealth: Payer: Self-pay | Admitting: Pulmonary Disease

## 2023-08-24 ENCOUNTER — Encounter: Payer: Self-pay | Admitting: *Deleted

## 2023-08-24 ENCOUNTER — Encounter: Payer: 59 | Attending: Pulmonary Disease | Admitting: *Deleted

## 2023-08-24 DIAGNOSIS — R0600 Dyspnea, unspecified: Secondary | ICD-10-CM | POA: Insufficient documentation

## 2023-08-24 DIAGNOSIS — J8410 Pulmonary fibrosis, unspecified: Secondary | ICD-10-CM | POA: Insufficient documentation

## 2023-08-24 DIAGNOSIS — Z5189 Encounter for other specified aftercare: Secondary | ICD-10-CM | POA: Insufficient documentation

## 2023-08-24 NOTE — Progress Notes (Signed)
Virtual orientation call completed today. shehas an appointment on Date: 08/25/2023  for EP eval and gym Orientation.  Documentation of diagnosis can be found in [facility] Date: 08/11/2023 .

## 2023-08-25 VITALS — Ht 62.1 in | Wt 231.8 lb

## 2023-08-25 DIAGNOSIS — R0600 Dyspnea, unspecified: Secondary | ICD-10-CM

## 2023-08-25 DIAGNOSIS — Z5189 Encounter for other specified aftercare: Secondary | ICD-10-CM | POA: Diagnosis not present

## 2023-08-25 DIAGNOSIS — J8410 Pulmonary fibrosis, unspecified: Secondary | ICD-10-CM

## 2023-08-25 NOTE — Addendum Note (Signed)
Addended by: Luciano Cutter on: 08/25/2023 10:37 AM   Modules accepted: Orders

## 2023-08-25 NOTE — Patient Instructions (Signed)
Patient Instructions  Patient Details  Name: Faith Padilla MRN: [id number] Date of Birth: [birth year] Referring Provider:  Luciano Cutter, MD  Below are your personal goals for exercise, nutrition, and risk factors. Our goal is to help you stay on track towards obtaining and maintaining these goals. We will be discussing your progress on these goals with you throughout the program.  Initial Exercise Prescription:  Initial Exercise Prescription - 08/25/23 1500       Date of Initial Exercise RX and Referring Provider   Date 08/25/23    Referring Provider Dr. Luciano Cutter      Oxygen   Oxygen Continuous    Liters 1L    Maintain Oxygen Saturation 88% or higher      NuStep   Level 1    SPM 80    Minutes 15    METs 1.3      Arm Ergometer   Level 1    Watts 25    RPM 50    Minutes 15    METs 1.3      REL-XR   Level 1    Speed 50    Minutes 15    METs 1.3      Track   Laps 10    Minutes 15    METs 1.54      Prescription Details   Duration Progress to 30 minutes of continuous aerobic without signs/symptoms of physical distress      Intensity   THRR 40-80% of Max Heartrate 110-135    Ratings of Perceived Exertion 11-13    Perceived Dyspnea 0-4      Progression   Progression Continue to progress workloads to maintain intensity without signs/symptoms of physical distress.      Resistance Training   Training Prescription Yes    Weight 2lb    Reps 10-15             Exercise Goals: Frequency: Be able to perform aerobic exercise two to three times per week in program working toward 2-5 days per week of home exercise.  Intensity: Work with a perceived exertion of 11 (fairly light) - 15 (hard) while following your exercise prescription.  We will make changes to your prescription with you as you progress through the program.   Duration: Be able to do 30 to 45 minutes of continuous aerobic exercise in addition to a 5 minute warm-up and a 5 minute cool-down  routine.   Nutrition Goals: Your personal nutrition goals will be established when you do your nutrition analysis with the dietician.  The following are general nutrition guidelines to follow: Cholesterol < 200mg /day Sodium < 1500mg /day Fiber: Women over 50 yrs - 21 grams per day  Personal Goals:  Personal Goals and Risk Factors at Admission - 08/24/23 1343       Core Components/Risk Factors/Patient Goals on Admission    Weight Management Yes;Weight Maintenance    Intervention Weight Management: Develop a combined nutrition and exercise program designed to reach desired caloric intake, while maintaining appropriate intake of nutrient and fiber, sodium and fats, and appropriate energy expenditure required for the weight goal.;Weight Management: Provide education and appropriate resources to help participant work on and attain dietary goals.    Admit Weight 230 lb (104.3 kg)   weighed 290 lb prior to knee surgery worked down to 223 lb   Goal Weight: Long Term 230 lb (104.3 kg)    Expected Outcomes Short Term: Continue to assess and modify interventions until  short term weight is achieved;Long Term: Adherence to nutrition and physical activity/exercise program aimed toward attainment of established weight goal;Weight Maintenance: Understanding of the daily nutrition guidelines, which includes 25-35% calories from fat, 7% or less cal from saturated fats, less than 200mg  cholesterol, less than 1.5gm of sodium, & 5 or more servings of fruits and vegetables daily    Improve shortness of breath with ADL's Yes    Intervention Provide education, individualized exercise plan and daily activity instruction to help decrease symptoms of SOB with activities of daily living.    Expected Outcomes Short Term: Improve cardiorespiratory fitness to achieve a reduction of symptoms when performing ADLs;Long Term: Be able to perform more ADLs without symptoms or delay the onset of symptoms    Increase knowledge of  respiratory medications and ability to use respiratory devices properly  Yes    Intervention Provide education and demonstration as needed of appropriate use of medications, inhalers, and oxygen therapy.    Expected Outcomes Short Term: Achieves understanding of medications use. Understands that oxygen is a medication prescribed by physician. Demonstrates appropriate use of inhaler and oxygen therapy.;Long Term: Maintain appropriate use of medications, inhalers, and oxygen therapy.    Lipids Yes    Intervention Provide education and support for participant on nutrition & aerobic/resistive exercise along with prescribed medications to achieve LDL 70mg , HDL >40mg .    Expected Outcomes Short Term: Participant states understanding of desired cholesterol values and is compliant with medications prescribed. Participant is following exercise prescription and nutrition guidelines.;Long Term: Cholesterol controlled with medications as prescribed, with individualized exercise RX and with personalized nutrition plan. Value goals: LDL < 70mg , HDL > 40 mg.             Tobacco Use Initial Evaluation: Social History   Tobacco Use  Smoking Status Never   Passive exposure: Never (Parent)  Smokeless Tobacco Never    Exercise Goals and Review:  Exercise Goals     Row Name 08/25/23 1520             Exercise Goals   Increase Physical Activity Yes       Intervention Develop an individualized exercise prescription for aerobic and resistive training based on initial evaluation findings, risk stratification, comorbidities and participant's personal goals.;Provide advice, education, support and counseling about physical activity/exercise needs.       Expected Outcomes Long Term: Exercising regularly at least 3-5 days a week.;Long Term: Add in home exercise to make exercise part of routine and to increase amount of physical activity.;Short Term: Attend rehab on a regular basis to increase amount of physical  activity.       Increase Strength and Stamina Yes       Intervention Develop an individualized exercise prescription for aerobic and resistive training based on initial evaluation findings, risk stratification, comorbidities and participant's personal goals.;Provide advice, education, support and counseling about physical activity/exercise needs.       Expected Outcomes Long Term: Improve cardiorespiratory fitness, muscular endurance and strength as measured by increased METs and functional capacity ( );Short Term: Perform resistance training exercises routinely during rehab and add in resistance training at home;Short Term: Increase workloads from initial exercise prescription for resistance, speed, and METs.       Able to understand and use rate of perceived exertion (RPE) scale Yes       Intervention Provide education and explanation on how to use RPE scale       Expected Outcomes Long Term:  Able to use RPE  to guide intensity level when exercising independently;Short Term: Able to use RPE daily in rehab to express subjective intensity level       Able to understand and use Dyspnea scale Yes       Intervention Provide education and explanation on how to use Dyspnea scale       Expected Outcomes Short Term: Able to use Dyspnea scale daily in rehab to express subjective sense of shortness of breath during exertion;Long Term: Able to use Dyspnea scale to guide intensity level when exercising independently       Knowledge and understanding of Target Heart Rate Range (THRR) Yes       Intervention Provide education and explanation of THRR including how the numbers were predicted and where they are located for reference       Expected Outcomes Long Term: Able to use THRR to govern intensity when exercising independently;Short Term: Able to use daily as guideline for intensity in rehab;Short Term: Able to state/look up THRR       Able to check pulse independently Yes       Intervention Review the  importance of being able to check your own pulse for safety during independent exercise;Provide education and demonstration on how to check pulse in carotid and radial arteries.       Expected Outcomes Short Term: Able to explain why pulse checking is important during independent exercise;Long Term: Able to check pulse independently and accurately       Understanding of Exercise Prescription Yes       Intervention Provide education, explanation, and written materials on patient's individual exercise prescription       Expected Outcomes Long Term: Able to explain home exercise prescription to exercise independently;Short Term: Able to explain program exercise prescription                Copy of goals given to participant.

## 2023-08-25 NOTE — Progress Notes (Signed)
Pulmonary Individual Treatment Plan  Patient Details  Name: Faith Padilla MRN: [id number] Date of Birth: 1951/05/22 Referring Provider:   Flowsheet Row Pulmonary Rehab from 08/25/2023 in [facility] Cardiac and Pulmonary Rehab  Referring Provider Dr. Luciano Cutter       Initial Encounter Date:  Flowsheet Row Pulmonary Rehab from 08/25/2023 in [facility] Cardiac and Pulmonary Rehab  Date 08/25/23       Visit Diagnosis: Pulmonary fibrosis (HCC)  Dyspnea, unspecified type  Patient's Home Medications on Admission:  Current Outpatient Medications:    albuterol (VENTOLIN HFA) 108 (90 Base) MCG/ACT inhaler, Inhale 1-2 puffs into the lungs every 6 (six) hours as needed for wheezing or shortness of breath., Disp: 18 g, Rfl: 1   atorvastatin (LIPITOR) 20 MG tablet, TAKE 1 TABLET(20 MG) BY MOUTH AT BEDTIME, Disp: 90 tablet, Rfl: 0   Biotin 1 MG CAPS, Take 1 capsule by mouth daily., Disp: , Rfl:    CALCIUM PO, Take by mouth., Disp: , Rfl:    cetirizine (ZYRTEC) 10 MG tablet, Take 1 tablet (10 mg total) by mouth at bedtime., Disp: 30 tablet, Rfl: 3   DULoxetine (CYMBALTA) 60 MG capsule, Take 1 capsule (60 mg total) by mouth daily. Reported on 12/26/2015, Disp: 90 capsule, Rfl: 3   fluticasone (FLONASE) 50 MCG/ACT nasal spray, Place 2 sprays into both nostrils daily., Disp: 16 g, Rfl: 5   Gabapentin, Once-Daily, 300 MG TABS, Take 0.5 tablets (150 mg total) by mouth at bedtime. (Patient not taking: Reported on 08/24/2023), Disp: 45 tablet, Rfl: 1   hydrOXYzine (ATARAX) 50 MG tablet, Take by mouth., Disp: , Rfl:    levothyroxine (SYNTHROID) 75 MCG tablet, Take 1 tablet (75 mcg total) by mouth daily before breakfast., Disp: 90 tablet, Rfl: 3   montelukast (SINGULAIR) 10 MG tablet, Take 1 tablet (10 mg total) by mouth at bedtime., Disp: 30 tablet, Rfl: 5   nystatin (MYCOSTATIN/NYSTOP) powder, Apply 1 Application topically 3 (three) times daily., Disp: 60 g, Rfl: 3   omeprazole (PRILOSEC) 20 MG capsule, Take  1 capsule (20 mg total) by mouth 2 (two) times daily before a meal., Disp: 180 capsule, Rfl: 0   Probiotic Product (PROBIOTIC PO), Take by mouth., Disp: , Rfl:    topiramate (TOPAMAX) 50 MG tablet, Take 1 tablet (50 mg total) by mouth daily., Disp: 90 tablet, Rfl: 1   trimethoprim (TRIMPEX) 100 MG tablet, Take 1 tablet (100 mg total) by mouth daily., Disp: 30 tablet, Rfl: 11  Past Medical History: 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   Diverticulitis    Dry eye syndrome of both eyes 08/13/2021   Dyspnea on exertion 08/13/2021   Family history of problems related to stress 11/15/2021   Frequency of urination and polyuria 06/25/2022   GAD (generalized anxiety disorder) 08/15/2021   Glaucoma    H/O urinary incontinence 06/25/2022   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  High risk medication use 08/02/2018   HLD (hyperlipidemia)    Iron deficiency 12/04/2019   Isolation (social) 12/07/2021   Kidney stones    Microscopic hematuria    Mixed incontinence    Morbid obesity (HCC)    Ocular hypertension, 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   Ocular migraine 08/02/2018   Osteoarthritis    lumbar spine   Palpitations 08/13/2021   Plantar fasciitis 06/25/2023   Post covid-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, 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   Postoperative follow-up 03/30/2020   Prediabetes 08/14/2015   Pseudophakia, both eyes 06/26/2016   Rectal  bleeding 10/17/2021   Shortness of breath dyspnea    Sleep apnea    CPAP   Vaginal yeast infection    Wears dentures    partial upper    Tobacco Use: Social History   Tobacco Use  Smoking Status Never   Passive exposure: Never (Parent)  Smokeless Tobacco Never    Labs: Review Flowsheet       Latest Ref Rng & Units 08/14/2015 08/15/2015 11/20/2015 05/07/2023  Labs for ITP Cardiac and Pulmonary Rehab  Cholestrol 100 - 199 mg/dL - 161  096  045   LDL (calc) 0 - 99 mg/dL - 80  87  81   HDL-C >40 mg/dL - 32  38  43   Trlycerides 0 - 149 mg/dL - 981  191  478   Hemoglobin A1c 4.8 - 5.6 % 5.6  - - 5.9      Pulmonary Assessment Scores:  Pulmonary Assessment Scores     Row Name 08/25/23 1523         ADL UCSD   ADL Phase Entry     SOB Score total 15     Rest 0     Walk 2     Stairs 5     Bath 2     Dress 2     Shop 4       CAT Score   CAT Score 18       mMRC Score   mMRC Score 1              UCSD: Self-administered rating of dyspnea associated with activities of daily living (ADLs) 6-point scale (0 = "not at all" to 5 = "maximal or unable to do because of breathlessness")  Scoring Scores range from 0 to 120.  Minimally important difference is 5 units  CAT: CAT can identify the health impairment of COPD patients and is better correlated with disease progression.  CAT has a scoring range of zero to 40. The CAT score is classified into four groups of low (less than 10), medium (10 - 20), high (21-30) and very high (31-40) based on the impact level of disease on health status. A CAT score over 10 suggests significant symptoms.  A worsening CAT score could be explained by an exacerbation, poor medication adherence, poor inhaler technique, or progression of COPD or comorbid conditions.  CAT MCID is 2 points  mMRC: mMRC (Modified Medical Research Council) Dyspnea Scale is used to assess the degree of baseline functional disability in patients of respiratory disease  due to dyspnea. No minimal important difference is established. A decrease in score of 1 point or greater is considered a positive change.   Pulmonary Function Assessment:   Exercise Target Goals: Exercise Program Goal: Individual exercise prescription set using results from initial 6 min walk test and THRR while considering  patient's activity barriers and safety.   Exercise Prescription Goal: Initial exercise prescription builds to 30-45  minutes a day of aerobic activity, 2-3 days per week.  Home exercise guidelines will be given to patient during program as part of exercise prescription that the participant will acknowledge.  Education: Aerobic Exercise: - Group verbal and visual presentation on the components of exercise prescription. Introduces F.I.T.T principle from ACSM for exercise prescriptions.  Reviews F.I.T.T. principles of aerobic exercise including progression. Written material given at graduation. Flowsheet Row Pulmonary Rehab from 08/25/2023 in [facility] Cardiac and Pulmonary Rehab  Education need identified 08/25/23       Education: Resistance Exercise: - Group verbal and visual presentation on the components of exercise prescription. Introduces F.I.T.T principle from ACSM for exercise prescriptions  Reviews F.I.T.T. principles of resistance exercise including progression. Written material given at graduation.    Education: Exercise & Equipment Safety: - Individual verbal instruction and demonstration of equipment use and safety with use of the equipment. Flowsheet Row Pulmonary Rehab from 08/25/2023 in [facility] Cardiac and Pulmonary Rehab  Date 08/25/23  Educator [facility]  Instruction Review Code 1- Verbalizes Understanding       Education: Exercise Physiology & General Exercise Guidelines: - Group verbal and written instruction with models to review the exercise physiology of the cardiovascular system and associated critical values. Provides general exercise guidelines with  specific guidelines to those with heart or lung disease.    Education: Flexibility, Balance, Mind/Body Relaxation: - Group verbal and visual presentation with interactive activity on the components of exercise prescription. Introduces F.I.T.T principle from ACSM for exercise prescriptions. Reviews F.I.T.T. principles of flexibility and balance exercise training including progression. Also discusses the mind body connection.  Reviews various relaxation techniques to help reduce and manage stress (i.e. Deep breathing, progressive muscle relaxation, and visualization). Balance handout provided to take home. Written material given at graduation.   Activity Barriers & Risk Stratification:  Activity Barriers & Cardiac Risk Stratification - 08/25/23 1516       Activity Barriers & Cardiac Risk Stratification   Activity Barriers Shortness of Breath;Left Hip Replacement;Back Problems             6 Minute Walk:  6 Minute Walk     Row Name 08/25/23 1514         6 Minute Walk   Phase Initial     Distance 930 feet     Walk Time 6 minutes     MPH 1.76     METS 1.3     RPE 13     Perceived Dyspnea  1     VO2 Peak 4.5     Symptoms No     Resting HR 86 bpm     Resting BP 106/68     Resting Oxygen Saturation  96 %     Exercise Oxygen Saturation  during 6 min walk 93 %     Max Ex. HR 102 bpm     Max Ex. BP 126/74     2 Minute Post BP 106/64       Interval HR   1 Minute HR 98     2 Minute HR 101     3 Minute HR 102     4 Minute HR 68     5 Minute HR 65     6 Minute HR 96     2 Minute Post HR 87     Interval Heart Rate? Yes       Interval Oxygen   Interval Oxygen? Yes     Baseline Oxygen Saturation % 96 %  1 Minute Oxygen Saturation % 98 %     1 Minute Liters of Oxygen 1 L     2 Minute Oxygen Saturation % 93 %     2 Minute Liters of Oxygen 1 L     3 Minute Oxygen Saturation % 94 %     3 Minute Liters of Oxygen 1 L     4 Minute Oxygen Saturation % 93 %     4 Minute Liters  of Oxygen 1 L     5 Minute Oxygen Saturation % 94 %     5 Minute Liters of Oxygen 1 L     6 Minute Oxygen Saturation % 97 %     6 Minute Liters of Oxygen 1 L     2 Minute Post Oxygen Saturation % 96 %     2 Minute Post Liters of Oxygen 1 L             Oxygen Initial Assessment:  Oxygen Initial Assessment - 08/25/23 1521       Home Oxygen   Home Oxygen Device None      Initial 6 min Walk   Oxygen Used Continuous    Liters per minute 1      Program Oxygen Prescription   Program Oxygen Prescription Continuous    Liters per minute 1    Comments use as needed      Intervention   Short Term Goals To learn and exhibit compliance with exercise, home and travel O2 prescription;To learn and understand importance of monitoring SPO2 with pulse oximeter and demonstrate accurate use of the pulse oximeter.;To learn and understand importance of maintaining oxygen saturations>88%;To learn and demonstrate proper pursed lip breathing techniques or other breathing techniques. ;To learn and demonstrate proper use of respiratory medications    Long  Term Goals Exhibits compliance with exercise, home  and travel O2 prescription;Verbalizes importance of monitoring SPO2 with pulse oximeter and return demonstration;Maintenance of O2 saturations>88%;Exhibits proper breathing techniques, such as pursed lip breathing or other method taught during program session;Compliance with respiratory medication;Demonstrates proper use of MDI's             Oxygen Re-Evaluation:   Oxygen Discharge (Final Oxygen Re-Evaluation):   Initial Exercise Prescription:  Initial Exercise Prescription - 08/25/23 1500       Date of Initial Exercise RX and Referring Provider   Date 08/25/23    Referring Provider Dr. Luciano Cutter      Oxygen   Oxygen Continuous    Liters 1L    Maintain Oxygen Saturation 88% or higher      NuStep   Level 1    SPM 80    Minutes 15    METs 1.3      Arm Ergometer   Level 1     Watts 25    RPM 50    Minutes 15    METs 1.3      REL-XR   Level 1    Speed 50    Minutes 15    METs 1.3      Track   Laps 10    Minutes 15    METs 1.54      Prescription Details   Duration Progress to 30 minutes of continuous aerobic without signs/symptoms of physical distress      Intensity   THRR 40-80% of Max Heartrate 110-135    Ratings of Perceived Exertion 11-13    Perceived Dyspnea 0-4  Progression   Progression Continue to progress workloads to maintain intensity without signs/symptoms of physical distress.      Resistance Training   Training Prescription Yes    Weight 2lb    Reps 10-15             Perform Capillary Blood Glucose checks as needed.  Exercise Prescription Changes:   Exercise Prescription Changes     Row Name 08/25/23 1500             Response to Exercise   Blood Pressure (Admit) 106/68       Blood Pressure (Exercise) 126/74       Blood Pressure (Exit) 106/64       Heart Rate (Admit) 86 bpm       Heart Rate (Exercise) 102 bpm       Heart Rate (Exit) 87 bpm       Oxygen Saturation (Admit) 96 %       Oxygen Saturation (Exercise) 93 %       Oxygen Saturation (Exit) 96 %       Rating of Perceived Exertion (Exercise) 13       Perceived Dyspnea (Exercise) 1       Symptoms none       Comments results                Exercise Comments:   Exercise Goals and Review:   Exercise Goals     Row Name 08/25/23 1520             Exercise Goals   Increase Physical Activity Yes       Intervention Develop an individualized exercise prescription for aerobic and resistive training based on initial evaluation findings, risk stratification, comorbidities and participant's personal goals.;Provide advice, education, support and counseling about physical activity/exercise needs.       Expected Outcomes Long Term: Exercising regularly at least 3-5 days a week.;Long Term: Add in home exercise to make exercise part of routine  and to increase amount of physical activity.;Short Term: Attend rehab on a regular basis to increase amount of physical activity.       Increase Strength and Stamina Yes       Intervention Develop an individualized exercise prescription for aerobic and resistive training based on initial evaluation findings, risk stratification, comorbidities and participant's personal goals.;Provide advice, education, support and counseling about physical activity/exercise needs.       Expected Outcomes Long Term: Improve cardiorespiratory fitness, muscular endurance and strength as measured by increased METs and functional capacity ( );Short Term: Perform resistance training exercises routinely during rehab and add in resistance training at home;Short Term: Increase workloads from initial exercise prescription for resistance, speed, and METs.       Able to understand and use rate of perceived exertion (RPE) scale Yes       Intervention Provide education and explanation on how to use RPE scale       Expected Outcomes Long Term:  Able to use RPE to guide intensity level when exercising independently;Short Term: Able to use RPE daily in rehab to express subjective intensity level       Able to understand and use Dyspnea scale Yes       Intervention Provide education and explanation on how to use Dyspnea scale       Expected Outcomes Short Term: Able to use Dyspnea scale daily in rehab to express subjective sense of shortness of breath during exertion;Long Term: Able to use Dyspnea scale  to guide intensity level when exercising independently       Knowledge and understanding of Target Heart Rate Range (THRR) Yes       Intervention Provide education and explanation of THRR including how the numbers were predicted and where they are located for reference       Expected Outcomes Long Term: Able to use THRR to govern intensity when exercising independently;Short Term: Able to use daily as guideline for intensity in  rehab;Short Term: Able to state/look up THRR       Able to check pulse independently Yes       Intervention Review the importance of being able to check your own pulse for safety during independent exercise;Provide education and demonstration on how to check pulse in carotid and radial arteries.       Expected Outcomes Short Term: Able to explain why pulse checking is important during independent exercise;Long Term: Able to check pulse independently and accurately       Understanding of Exercise Prescription Yes       Intervention Provide education, explanation, and written materials on patient's individual exercise prescription       Expected Outcomes Long Term: Able to explain home exercise prescription to exercise independently;Short Term: Able to explain program exercise prescription                Exercise Goals Re-Evaluation :   Discharge Exercise Prescription (Final Exercise Prescription Changes):  Exercise Prescription Changes - 08/25/23 1500       Response to Exercise   Blood Pressure (Admit) 106/68    Blood Pressure (Exercise) 126/74    Blood Pressure (Exit) 106/64    Heart Rate (Admit) 86 bpm    Heart Rate (Exercise) 102 bpm    Heart Rate (Exit) 87 bpm    Oxygen Saturation (Admit) 96 %    Oxygen Saturation (Exercise) 93 %    Oxygen Saturation (Exit) 96 %    Rating of Perceived Exertion (Exercise) 13    Perceived Dyspnea (Exercise) 1    Symptoms none    Comments results             Nutrition:  Target Goals: Understanding of nutrition guidelines, daily intake of sodium 1500mg , cholesterol 200mg , calories 30% from fat and 7% or less from saturated fats, daily to have 5 or more servings of fruits and vegetables.  Education: All About Nutrition: -Group instruction provided by verbal, written material, interactive activities, discussions, models, and posters to present general guidelines for heart healthy nutrition including fat, fiber, MyPlate, the role of  sodium in heart healthy nutrition, utilization of the nutrition label, and utilization of this knowledge for meal planning. Follow up email sent as well. Written material given at graduation. Flowsheet Row Pulmonary Rehab from 08/25/2023 in [facility] Cardiac and Pulmonary Rehab  Education need identified 08/25/23       Biometrics:  Pre Biometrics - 08/25/23 1520       Pre Biometrics   Height 5' 2.1" (1.577 m)    Weight 231 lb 12.8 oz (105.1 kg)    Waist Circumference 48 inches    Hip Circumference 51 inches    Waist to Hip Ratio 0.94 %    BMI (Calculated) 42.28    Single Leg Stand 3.5 seconds              Nutrition Therapy Plan and Nutrition Goals:  Nutrition Therapy & Goals - 08/24/23 1340       Intervention Plan   Intervention  Prescribe, educate and counsel regarding individualized specific dietary modifications aiming towards targeted core components such as weight, hypertension, lipid management, diabetes, heart failure and other comorbidities.    Expected Outcomes Short Term Goal: A plan has been developed with personal nutrition goals set during dietitian appointment.;Short Term Goal: Understand basic principles of dietary content, such as calories, fat, sodium, cholesterol and nutrients.;Long Term Goal: Adherence to prescribed nutrition plan.             Nutrition Assessments:  MEDIFICTS Score Key: >=70 Need to make dietary changes  40-70 Heart Healthy Diet <= 40 Therapeutic Level Cholesterol Diet  Flowsheet Row Pulmonary Rehab from 08/25/2023 in [facility] Cardiac and Pulmonary Rehab  Picture Your Plate Total Score on Admission 53      Picture Your Plate Scores: <40 Unhealthy dietary pattern with much room for improvement. 41-50 Dietary pattern unlikely to meet recommendations for good health and room for improvement. 51-60 More healthful dietary pattern, with some room for improvement.  >60 Healthy dietary pattern, although there may be some specific behaviors  that could be improved.   Nutrition Goals Re-Evaluation:   Nutrition Goals Discharge (Final Nutrition Goals Re-Evaluation):   Psychosocial: Target Goals: Acknowledge presence or absence of significant depression and/or stress, maximize coping skills, provide positive support system. Participant is able to verbalize types and ability to use techniques and skills needed for reducing stress and depression.   Education: Stress, Anxiety, and Depression - Group verbal and visual presentation to define topics covered.  Reviews how body is impacted by stress, anxiety, and depression.  Also discusses healthy ways to reduce stress and to treat/manage anxiety and depression.  Written material given at graduation.   Education: Sleep Hygiene -Provides group verbal and written instruction about how sleep can affect your health.  Define sleep hygiene, discuss sleep cycles and impact of sleep habits. Review good sleep hygiene tips.    Initial Review & Psychosocial Screening:  Initial Psych Review & Screening - 08/24/23 1337       Initial Review   Current issues with Current Psychotropic Meds;Current Depression   had to lose 100 lbs off to get knee surgery, very stressful     Family Dynamics   Good Support System? Yes   daughter  in [place]     Barriers   Psychosocial barriers to participate in program There are no identifiable barriers or psychosocial needs.      Screening Interventions   Interventions Encouraged to exercise;Provide feedback about the scores to participant;To provide support and resources with identified psychosocial needs    Expected Outcomes Short Term goal: Utilizing psychosocial counselor, staff and physician to assist with identification of specific Stressors or current issues interfering with healing process. Setting desired goal for each stressor or current issue identified.;Long Term Goal: Stressors or current issues are controlled or eliminated.;Short Term goal:  Identification and review with participant of any Quality of Life or Depression concerns found by scoring the questionnaire.;Long Term goal: The participant improves quality of Life and PHQ9 Scores as seen by post scores and/or verbalization of changes             Quality of Life Scores:  Scores of 19 and below usually indicate a poorer quality of life in these areas.  A difference of  2-3 points is a clinically meaningful difference.  A difference of 2-3 points in the total score of the Quality of Life Index has been associated with significant improvement in overall quality of life, self-image, physical symptoms,  and general health in studies assessing change in quality of life.  PHQ-9: Review Flowsheet  More data exists      08/25/2023 07/20/2023 05/07/2023 03/12/2016 11/19/2015  Depression screen PHQ 2/9  Decreased Interest 0 0 3 0 0  Down, Depressed, Hopeless 0 0 0 0 0  PHQ - 2 Score 0 0 3 0 0  Altered sleeping 0 0 3 - -  Tired, decreased energy 0 0 3 - -  Change in appetite 0 0 3 - -  Feeling bad or failure about yourself  0 0 0 - -  Trouble concentrating 0 0 1 - -  Moving slowly or fidgety/restless 0 0 0 - -  Suicidal thoughts 0 0 0 - -  PHQ-9 Score 0 0 13 - -  Difficult doing work/chores Not difficult at all Not difficult at all Not difficult at all - -   Interpretation of Total Score  Total Score Depression Severity:  1-4 = Minimal depression, 5-9 = Mild depression, 10-14 = Moderate depression, 15-19 = Moderately severe depression, 20-27 = Severe depression   Psychosocial Evaluation and Intervention:  Psychosocial Evaluation - 08/24/23 1451       Psychosocial Evaluation & Interventions   Comments Kierney has no barriers to starting the program.  She is ready to start an exercise program . She lives alone and has a 63 year old cat. Her daughter (in [place])is her support. She recently moved to [facility] and a month later had a hip replacement. She is healing well.     Expected Outcomes STG attend all scheduled sesions, continue to heal from her surgery and is able to progress her exercise. LTG continues with exercise progression after discharge    Continue Psychosocial Services  Follow up required by staff             Psychosocial Re-Evaluation:   Psychosocial Discharge (Final Psychosocial Re-Evaluation):   Education: Education Goals: Education classes will be provided on a weekly basis, covering required topics. Participant will state understanding/return demonstration of topics presented.  Learning Barriers/Preferences:  Learning Barriers/Preferences - 08/24/23 1341       Learning Barriers/Preferences   Learning Barriers None    Learning Preferences None             General Pulmonary Education Topics:  Infection Prevention: - Provides verbal and written material to individual with discussion of infection control including proper hand washing and proper equipment cleaning during exercise session. Flowsheet Row Pulmonary Rehab from 08/25/2023 in [facility] Cardiac and Pulmonary Rehab  Date 08/25/23  Educator [facility]  Instruction Review Code 1- Verbalizes Understanding       Falls Prevention: - Provides verbal and written material to individual with discussion of falls prevention and safety. Flowsheet Row Pulmonary Rehab from 08/25/2023 in [facility] Cardiac and Pulmonary Rehab  Date 08/25/23  Educator [facility]  Instruction Review Code 1- Verbalizes Understanding       Chronic Lung Disease Review: - Group verbal instruction with posters, models, PowerPoint presentations and videos,  to review new updates, new respiratory medications, new advancements in procedures and treatments. Providing information on websites and "800" numbers for continued self-education. Includes information about supplement oxygen, available portable oxygen systems, continuous and intermittent flow rates, oxygen safety, concentrators, and Medicare reimbursement for oxygen.  Explanation of Pulmonary Drugs, including class, frequency, complications, importance of spacers, rinsing mouth after steroid MDI's, and proper cleaning methods for nebulizers. Review of basic lung anatomy and physiology related to function, structure, and complications of lung disease.  Review of risk factors. Discussion about methods for diagnosing sleep apnea and types of masks and machines for OSA. Includes a review of the use of types of environmental controls: home humidity, furnaces, filters, dust mite/pet prevention, HEPA vacuums. Discussion about weather changes, air quality and the benefits of nasal washing. Instruction on Warning signs, infection symptoms, calling MD promptly, preventive modes, and value of vaccinations. Review of effective airway clearance, coughing and/or vibration techniques. Emphasizing that all should Create an Action Plan. Written material given at graduation. Flowsheet Row Pulmonary Rehab from 08/25/2023 in [facility] Cardiac and Pulmonary Rehab  Education need identified 08/25/23       AED/CPR: - Group verbal and written instruction with the use of models to demonstrate the basic use of the AED with the basic ABC's of resuscitation.    Anatomy and Cardiac Procedures: - Group verbal and visual presentation and models provide information about basic cardiac anatomy and function. Reviews the testing methods done to diagnose heart disease and the outcomes of the test results. Describes the treatment choices: Medical Management, Angioplasty, or Coronary Bypass Surgery for treating various heart conditions including Myocardial Infarction, Angina, Valve Disease, and Cardiac Arrhythmias.  Written material given at graduation.   Medication Safety: - Group verbal and visual instruction to review commonly prescribed medications for heart and lung disease. Reviews the medication, class of the drug, and side effects. Includes the steps to properly store meds and maintain the  prescription regimen.  Written material given at graduation.   Other: -Provides group and verbal instruction on various topics (see comments)   Knowledge Questionnaire Score:  Knowledge Questionnaire Score - 08/25/23 1525       Knowledge Questionnaire Score   Pre Score 14/18              Core Components/Risk Factors/Patient Goals at Admission:  Personal Goals and Risk Factors at Admission - 08/24/23 1343       Core Components/Risk Factors/Patient Goals on Admission    Weight Management Yes;Weight Maintenance    Intervention Weight Management: Develop a combined nutrition and exercise program designed to reach desired caloric intake, while maintaining appropriate intake of nutrient and fiber, sodium and fats, and appropriate energy expenditure required for the weight goal.;Weight Management: Provide education and appropriate resources to help participant work on and attain dietary goals.    Admit Weight 230 lb (104.3 kg)   weighed 290 lb prior to knee surgery worked down to 223 lb   Goal Weight: Long Term 230 lb (104.3 kg)    Expected Outcomes Short Term: Continue to assess and modify interventions until short term weight is achieved;Long Term: Adherence to nutrition and physical activity/exercise program aimed toward attainment of established weight goal;Weight Maintenance: Understanding of the daily nutrition guidelines, which includes 25-35% calories from fat, 7% or less cal from saturated fats, less than 200mg  cholesterol, less than 1.5gm of sodium, & 5 or more servings of fruits and vegetables daily    Improve shortness of breath with ADL's Yes    Intervention Provide education, individualized exercise plan and daily activity instruction to help decrease symptoms of SOB with activities of daily living.    Expected Outcomes Short Term: Improve cardiorespiratory fitness to achieve a reduction of symptoms when performing ADLs;Long Term: Be able to perform more ADLs without symptoms  or delay the onset of symptoms    Increase knowledge of respiratory medications and ability to use respiratory devices properly  Yes    Intervention Provide education and demonstration as needed  of appropriate use of medications, inhalers, and oxygen therapy.    Expected Outcomes Short Term: Achieves understanding of medications use. Understands that oxygen is a medication prescribed by physician. Demonstrates appropriate use of inhaler and oxygen therapy.;Long Term: Maintain appropriate use of medications, inhalers, and oxygen therapy.    Lipids Yes    Intervention Provide education and support for participant on nutrition & aerobic/resistive exercise along with prescribed medications to achieve LDL 70mg , HDL >40mg .    Expected Outcomes Short Term: Participant states understanding of desired cholesterol values and is compliant with medications prescribed. Participant is following exercise prescription and nutrition guidelines.;Long Term: Cholesterol controlled with medications as prescribed, with individualized exercise RX and with personalized nutrition plan. Value goals: LDL < 70mg , HDL > 40 mg.             Education:Diabetes - Individual verbal and written instruction to review signs/symptoms of diabetes, desired ranges of glucose level fasting, after meals and with exercise. Acknowledge that pre and post exercise glucose checks will be done for 3 sessions at entry of program.   Know Your Numbers and Heart Failure: - Group verbal and visual instruction to discuss disease risk factors for cardiac and pulmonary disease and treatment options.  Reviews associated critical values for Overweight/Obesity, Hypertension, Cholesterol, and Diabetes.  Discusses basics of heart failure: signs/symptoms and treatments.  Introduces Heart Failure Zone chart for action plan for heart failure.  Written material given at graduation.   Core Components/Risk Factors/Patient Goals Review:    Core  Components/Risk Factors/Patient Goals at Discharge (Final Review):    ITP Comments:  ITP Comments     Row Name 08/24/23 1354 08/25/23 1513         ITP Comments Virtual orientation call completed today. shehas an appointment on Date: 08/25/2023  for EP eval and gym Orientation.  Documentation of diagnosis can be found in [facility] Date: 08/11/2023 . Completed and gym orientation. Initial ITP created and sent for review to Dr. Jinny Sanders, Medical Director.               Comments: Initial ITP

## 2023-08-26 ENCOUNTER — Encounter: Payer: Self-pay | Admitting: *Deleted

## 2023-08-26 ENCOUNTER — Ambulatory Visit: Payer: 59

## 2023-08-26 VITALS — Ht 61.0 in | Wt 230.0 lb

## 2023-08-26 DIAGNOSIS — Z0000 Encounter for general adult medical examination without abnormal findings: Secondary | ICD-10-CM

## 2023-08-26 DIAGNOSIS — R0600 Dyspnea, unspecified: Secondary | ICD-10-CM

## 2023-08-26 DIAGNOSIS — J8410 Pulmonary fibrosis, unspecified: Secondary | ICD-10-CM

## 2023-08-26 NOTE — Progress Notes (Signed)
Subjective:   [name] is a 72 y.o. female who presents for Medicare Annual (Subsequent) preventive examination.  Visit Complete: Virtual I connected with  [name] on 08/26/23 by a audio enabled telemedicine application and verified that I am speaking with the correct person using two identifiers.  Patient Location: Home  Provider Location: Home Office  I discussed the limitations of evaluation and management by telemedicine. The patient expressed understanding and agreed to proceed.  Vital Signs: Because this visit was a virtual/telehealth visit, some criteria may be missing or patient reported. Any vitals not documented were not able to be obtained and vitals that have been documented are patient reported.  Patient Medicare AWV questionnaire was completed by the patient on 08/25/23; I have confirmed that all information answered by patient is correct and no changes since this date.  Cardiac Risk Factors include: advanced age (>29men, >13 women);dyslipidemia;obesity (BMI >30kg/m2);Other (see comment), Risk factor comments: OSA (cpap), prediabetic     Objective:    Today's Vitals   08/26/23 0854  Weight: 230 lb (104.3 kg)  Height: 5\' 1"  (1.549 m)   Body mass index is 43.46 kg/m.     08/26/2023    9:05 AM 03/25/2016    7:15 AM 03/12/2016    8:59 AM 01/22/2016    6:18 PM 11/19/2015    3:15 PM 10/10/2015   11:00 AM 10/01/2015   10:48 AM  Advanced Directives  Does Patient Have a Medical Advance Directive? No No No No Yes Yes Yes  Type of Agricultural consultant;Living will Healthcare Power of [place];Living will Healthcare Power of [place];Living will  Does patient want to make changes to medical advance directive?     No - Patient declined No - Patient declined   Copy of Healthcare Power of Attorney in Chart?     No - copy requested No - copy requested   Would patient like information on creating a medical advance directive? Yes  (MAU/Ambulatory/Procedural Areas - Information given)  No - patient declined information No - patient declined information       Current Medications (verified) Outpatient Encounter Medications as of 08/26/2023  Medication Sig   albuterol (VENTOLIN HFA) 108 (90 Base) MCG/ACT inhaler Inhale 1-2 puffs into the lungs every 6 (six) hours as needed for wheezing or shortness of breath.   atorvastatin (LIPITOR) 20 MG tablet TAKE 1 TABLET(20 MG) BY MOUTH AT BEDTIME   Biotin 1 MG CAPS Take 1 capsule by mouth daily.   CALCIUM PO Take by mouth.   cetirizine (ZYRTEC) 10 MG tablet Take 1 tablet (10 mg total) by mouth at bedtime.   DULoxetine (CYMBALTA) 60 MG capsule Take 1 capsule (60 mg total) by mouth daily. Reported on 12/26/2015   fluticasone (FLONASE) 50 MCG/ACT nasal spray Place 2 sprays into both nostrils daily.   hydrOXYzine (ATARAX) 50 MG tablet Take by mouth.   levothyroxine (SYNTHROID) 75 MCG tablet Take 1 tablet (75 mcg total) by mouth daily before breakfast.   montelukast (SINGULAIR) 10 MG tablet Take 1 tablet (10 mg total) by mouth at bedtime.   nystatin (MYCOSTATIN/NYSTOP) powder Apply 1 Application topically 3 (three) times daily.   omeprazole (PRILOSEC) 20 MG capsule Take 1 capsule (20 mg total) by mouth 2 (two) times daily before a meal.   Probiotic Product (PROBIOTIC PO) Take by mouth.   topiramate (TOPAMAX) 50 MG tablet Take 1 tablet (50 mg total) by mouth daily.   trimethoprim (TRIMPEX) 100  MG tablet Take 1 tablet (100 mg total) by mouth daily.   Gabapentin, Once-Daily, 300 MG TABS Take 0.5 tablets (150 mg total) by mouth at bedtime. (Patient not taking: Reported on 08/26/2023)   No facility-administered encounter medications on file as of 08/26/2023.    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   Diverticulitis    Dry eye syndrome of both eyes 08/13/2021   Dyspnea on exertion 08/13/2021   Family history of problems related to stress 11/15/2021   Frequency of urination and polyuria 06/25/2022   GAD (generalized anxiety disorder) 08/15/2021   Glaucoma    H/O urinary incontinence 06/25/2022   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   High risk medication use 08/02/2018   HLD (hyperlipidemia)    Iron deficiency 12/04/2019   Isolation (social) 12/07/2021   Kidney stones    Microscopic hematuria    Mixed incontinence    Morbid obesity (HCC)    Ocular hypertension, 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   Ocular migraine 08/02/2018   Osteoarthritis    lumbar spine   Palpitations 08/13/2021   Plantar fasciitis 06/25/2023   Post covid-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, 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   Postoperative follow-up 03/30/2020   Prediabetes 08/14/2015   Pseudophakia, both eyes 06/26/2016   Rectal bleeding 10/17/2021   Shortness of breath dyspnea    Sleep apnea    CPAP   Vaginal yeast infection    Wears dentures    partial upper   Past Surgical History:  Procedure Laterality Date   ABDOMINAL HYSTERECTOMY  09/08/1996   BACK SURGERY  1990/2014   CATARACT EXTRACTION, BILATERAL  09/08/2012   COLONOSCOPY WITH PROPOFOL N/A 03/25/2016   Procedure: COLONOSCOPY WITH PROPOFOL;  Surgeon: Midge Minium, MD;  Location: [facility] ENDOSCOPY;  Service: Endoscopy;  Laterality: N/A;   deviated nose septum surgery  09/08/1998   ESOPHAGOGASTRODUODENOSCOPY (EGD) WITH PROPOFOL N/A 03/25/2016    Procedure: ESOPHAGOGASTRODUODENOSCOPY (EGD) WITH PROPOFOL;  Surgeon: Midge Minium, MD;  Location: [facility] ENDOSCOPY;  Service: Endoscopy;  Laterality: N/A;   EYE SURGERY     LITHOTRIPSY  09/08/2010   TOTAL HIP ARTHROPLASTY Left    Family History  Problem Relation Age of Onset   Asthma Mother    Heart disease Mother    Diabetes Mother    Kidney disease Mother    Cancer Father        lung cancer   COPD Father    Liver cancer Father    Cancer Sister        Brain Cancer   Allergic rhinitis Sister    Asthma Sister    Hematuria Daughter    Bladder Cancer Neg Hx    Social History   Socioeconomic History   Marital status: Divorced    Spouse name: Not on file   Number of children: 3   Years of education: Not on file   Highest education level: Some  college, no degree  Occupational History   Occupation: retired  Tobacco Use   Smoking status: Never    Passive exposure: Never (Parent)   Smokeless tobacco: Never  Vaping Use   Vaping status: Never Used  Substance and Sexual Activity   Alcohol use: No   Drug use: No   Sexual activity: Never    Birth control/protection: Surgical  Other Topics Concern   Not on file  Social History Narrative   Not on file   Social Drivers of Health   Financial Resource Strain: Low Risk  (08/25/2023)   Overall Financial Resource Strain (CARDIA)    Difficulty of Paying Living Expenses: Not hard at all  Food Insecurity: No Food Insecurity (08/25/2023)   Hunger Vital Sign    Worried About Running Out of Food in the Last Year: Never true    Ran Out of Food in the Last Year: Never true  Transportation Needs: No Transportation Needs (08/25/2023)   PRAPARE - Administrator, Civil Service (Medical): No    Lack of Transportation (Non-Medical): No  Physical Activity: Insufficiently Active (08/25/2023)   Exercise Vital Sign    Days of Exercise per Week: 1 day    Minutes of Exercise per Session: 10 min  Stress: No Stress Concern Present  (08/25/2023)   [employer] of Occupational Health - Occupational Stress Questionnaire    Feeling of Stress : Only a little  Social Connections: Socially Isolated (08/25/2023)   Social Connection and Isolation Panel [NHANES]    Frequency of Communication with Friends and Family: More than three times a week    Frequency of Social Gatherings with Friends and Family: Three times a week    Attends Religious Services: Never    Active Member of Clubs or Organizations: No    Attends Engineer, structural: Never    Marital Status: Divorced    Tobacco Counseling Counseling given: Not Answered   Clinical Intake:  Pre-visit preparation completed: Yes  Pain : No/denies pain     BMI - recorded: 43.46 Nutritional Status: BMI > 30  Obese Nutritional Risks: None Diabetes: No  How often do you need to have someone help you when you read instructions, pamphlets, or other written materials from your doctor or pharmacy?: 3 - Sometimes (needs clarification on some things) What is the last grade level you completed in school?: some college  Interpreter Needed?: No  Information entered by :: Tora Kindred, CMA   Activities of Daily Living    08/25/2023    4:10 PM  In your present state of health, do you have any difficulty performing the following activities:  Hearing? 0  Vision? 0  Difficulty concentrating or making decisions? 0  Walking or climbing stairs? 1  Comment uses a cane  Dressing or bathing? 0  Doing errands, shopping? 0  Preparing Food and eating ? N  Using the Toilet? N  In the past six months, have you accidently leaked urine? Y  Comment wears a pad  Do you have problems with loss of bowel control? N  Managing your Medications? N  Managing your Finances? N  Housekeeping or managing your Housekeeping? N    Patient Care Team: Sherlyn Hay, DO as PCP - General (Family Medicine)  Indicate any recent Medical Services you may have received from other  than [facility] providers in the past year (date may be approximate).     Assessment:   This is a routine wellness examination for Faith Padilla.  Hearing/Vision screen Hearing Screening - Comments:: Denies hearing loss Vision Screening - Comments:: Gets eye exams   Goals Addressed               This Visit's Progress     Patient Stated (pt-stated)        Complete pulmonary rehab      Depression Screen    08/26/2023    9:03 AM 08/25/2023    3:25 PM 07/20/2023    9:52 AM 05/07/2023    3:25 PM 03/12/2016    8:59 AM 11/19/2015    3:15 PM 10/10/2015   11:02 AM  PHQ 2/9 Scores  PHQ - 2 Score 0 0 0 3 0 0 0  PHQ- 9 Score 0 0 0 13       Fall Risk    08/25/2023    4:10 PM 08/24/2023    1:28 PM 07/20/2023    9:51 AM 05/07/2023    3:25 PM 03/12/2016    8:59 AM  Fall Risk   Falls in the past year? 0 0 0 0 No  Number falls in past yr: 0 0 0 0   Injury with Fall? 0 0 0 0   Risk for fall due to : No Fall Risks   No Fall Risks   Follow up Falls prevention discussed Falls evaluation completed;Education provided;Falls prevention discussed  Falls evaluation completed     MEDICARE RISK AT HOME: Medicare Risk at Home Any stairs in or around the home?: No If so, are there any without handrails?: No Home free of loose throw rugs in walkways, pet beds, electrical cords, etc?: No Adequate lighting in your home to reduce risk of falls?: Yes Life alert?: No Use of a cane, walker or w/c?: Yes (cane) Grab bars in the bathroom?: Yes Shower chair or bench in shower?: Yes Elevated toilet seat or a handicapped toilet?: Yes  TIMED UP AND GO:  Was the test performed?  No    Cognitive Function:        08/26/2023    9:06 AM  6CIT Screen  What Year? 0 points  What month? 0 points  What time? 0 points  Count back from 20 0 points  Months in reverse 0 points  Repeat phrase 0 points  Total Score 0 points    Immunizations Immunization History  Administered Date(s) Administered   Fluad  Trivalent(High Dose 65+) 07/20/2023   Influenza, Mdck, Trivalent,PF 6+ MOS(egg free) 09/07/2013   Influenza, Seasonal, Injecte, Preservative Fre 08/22/2014, 05/14/2021   Influenza,inj,Quad PF,6+ Mos 10/03/2012, 08/14/2015   Influenza-Unspecified 10/03/2012, 09/07/2013, 08/22/2014, 08/14/2015   Moderna Sars-Covid-2 Vaccination 02/17/2020, 03/16/2020   Pneumococcal Polysaccharide-23 09/09/2011, 10/03/2012, 05/14/2021   Tdap 09/08/2004, 08/21/2011, 02/22/2021    TDAP status: Up to date  Flu Vaccine status: Up to date  Pneumococcal vaccine status: Due, Education has been provided regarding the importance of this vaccine. Advised may receive this vaccine at local pharmacy or Health Dept. Aware to provide a copy of the vaccination record if obtained from local pharmacy or Health Dept. Verbalized acceptance and understanding.  Covid-19 vaccine status: Information provided on how to obtain vaccines.   Qualifies for Shingles Vaccine? Yes   Zostavax completed No   Shingrix Completed?: No.    Education has been provided regarding the importance of this vaccine. Patient has been advised to call insurance company to determine out of pocket expense if they have not yet received this vaccine. Advised may also receive vaccine at local pharmacy or Health Dept.  Verbalized acceptance and understanding.  Screening Tests Health Maintenance  Topic Date Due   Zoster Vaccines- Shingrix (1 of 2) Never done   DEXA SCAN  Never done   MAMMOGRAM  01/03/2017   COVID-19 Vaccine (3 - Moderna risk series) 04/13/2020   Pneumonia Vaccine 20+ Years old (2 of 2 - PCV) 05/14/2022   Medicare Annual Wellness (AWV)  08/25/2024   Colonoscopy  07/19/2026   DTaP/Tdap/Td (4 - Td or Tdap) 02/23/2031   INFLUENZA VACCINE  Completed   Hepatitis C Screening  Completed   HPV VACCINES  Aged Out    Health Maintenance  Health Maintenance Due  Topic Date Due   Zoster Vaccines- Shingrix (1 of 2) Never done   DEXA SCAN  Never  done   MAMMOGRAM  01/03/2017   COVID-19 Vaccine (3 - Moderna risk series) 04/13/2020   Pneumonia Vaccine 34+ Years old (2 of 2 - PCV) 05/14/2022    Colorectal cancer screening: Type of screening: Colonoscopy. Completed 11/23/19. Repeat every 3 years. Patient declined referral.  Mammogram status: Ordered 07/20/23. Pt provided with contact info and advised to call to schedule appt.   Bone Density status: Ordered 07/20/23. Pt provided with contact info and advised to call to schedule appt.  Lung Cancer Screening: (Low Dose CT Chest recommended if Age 31-80 years, 20 pack-year currently smoking OR have quit w/in 15years.) does qualify.   Lung Cancer Screening Referral: n/a  Additional Screening:  Hepatitis C Screening: does not qualify; Completed 07/20/23  Vision Screening: Recommended annual ophthalmology exams for early detection of glaucoma and other disorders of the eye.  Dental Screening: Recommended annual dental exams for proper oral hygiene   Community Resource Referral / Chronic Care Management: CRR required this visit?  No   CCM required this visit?  No     Plan:     I have personally reviewed and noted the following in the patient's chart:   Medical and social history Use of alcohol, tobacco or illicit drugs  Current medications and supplements including opioid prescriptions. Patient is not currently taking opioid prescriptions. Functional ability and status Nutritional status Physical activity Advanced directives List of other physicians Hospitalizations, surgeries, and ER visits in previous 12 months Vitals Screenings to include cognitive, depression, and falls Referrals and appointments  In addition, I have reviewed and discussed with patient certain preventive protocols, quality metrics, and best practice recommendations. A written personalized care plan for preventive services as well as general preventive health recommendations were provided to patient.      Tora Kindred, CMA   08/26/2023   After Visit Summary: (MyChart) Due to this being a telephonic visit, the after visit summary with patients personalized plan was offered to patient via MyChart   Nurse Notes:  Needs MMG and DEXA orders were placed 07/20/23. Gave patient ph# to call and schedule. Needs Prevnar 20 and shingles vaccines. Last colon 11/23/19. Due for colon patient states it has been postponed due to hip surgery and unable to lay on her side. Patient states she has received the covid booster for this year. Patient starts pulmonary rehab on 08/27/23.

## 2023-08-26 NOTE — Patient Instructions (Signed)
Ms. Fabrizio , Thank you for taking time to come for your Medicare Wellness Visit. I appreciate your ongoing commitment to your health goals. Please review the following plan we discussed and let me know if I can assist you in the future.   Referrals/Orders/Follow-Ups/Clinician Recommendations: Get the shingles vaccines and Prevnar 20 (pneumonia) vaccine. Call [facility] @ [phone] to schedule your mammogram and bone density test. Schedule your colonoscopy as soon as you are able.  This is a list of the screening recommended for you and due dates:  Health Maintenance  Topic Date Due   Zoster (Shingles) Vaccine (1 of 2) Never done   DEXA scan (bone density measurement)  Never done   Mammogram  01/03/2017   COVID-19 Vaccine (3 - Moderna risk series) 04/13/2020   Pneumonia Vaccine (2 of 2 - PCV) 05/14/2022   Medicare Annual Wellness Visit  08/25/2024   Colon Cancer Screening  07/19/2026   DTaP/Tdap/Td vaccine (4 - Td or Tdap) 02/23/2031   Flu Shot  Completed   Hepatitis C Screening  Completed   HPV Vaccine  Aged Out    Advanced directives: (ACP Link)Information on Advanced Care Planning can be found at [facility] Advance Health Care Directives Advance Health Care Directives ([email or web address]   Once you have completed the forms, please bring a copy of your health care power of attorney and living will to the office to be added to your chart at your convenience.   Next Medicare Annual Wellness Visit scheduled for next year: Yes, 09/07/24 @ 8:50am

## 2023-08-26 NOTE — Progress Notes (Signed)
Pulmonary Individual Treatment Plan  Patient Details  Name: Faith Padilla MRN: [id number] Date of Birth: Oct 24, 1950 Referring Provider:   Flowsheet Row Pulmonary Rehab from 08/25/2023 in [facility] Cardiac and Pulmonary Rehab  Referring Provider Dr. Luciano Cutter       Initial Encounter Date:  Flowsheet Row Pulmonary Rehab from 08/25/2023 in [facility] Cardiac and Pulmonary Rehab  Date 08/25/23       Visit Diagnosis: Pulmonary fibrosis (HCC)  Dyspnea, unspecified type  Patient's Home Medications on Admission:  Current Outpatient Medications:    albuterol (VENTOLIN HFA) 108 (90 Base) MCG/ACT inhaler, Inhale 1-2 puffs into the lungs every 6 (six) hours as needed for wheezing or shortness of breath., Disp: 18 g, Rfl: 1   atorvastatin (LIPITOR) 20 MG tablet, TAKE 1 TABLET(20 MG) BY MOUTH AT BEDTIME, Disp: 90 tablet, Rfl: 0   Biotin 1 MG CAPS, Take 1 capsule by mouth daily., Disp: , Rfl:    CALCIUM PO, Take by mouth., Disp: , Rfl:    cetirizine (ZYRTEC) 10 MG tablet, Take 1 tablet (10 mg total) by mouth at bedtime., Disp: 30 tablet, Rfl: 3   DULoxetine (CYMBALTA) 60 MG capsule, Take 1 capsule (60 mg total) by mouth daily. Reported on 12/26/2015, Disp: 90 capsule, Rfl: 3   fluticasone (FLONASE) 50 MCG/ACT nasal spray, Place 2 sprays into both nostrils daily., Disp: 16 g, Rfl: 5   Gabapentin, Once-Daily, 300 MG TABS, Take 0.5 tablets (150 mg total) by mouth at bedtime. (Patient not taking: Reported on 08/26/2023), Disp: 45 tablet, Rfl: 1   hydrOXYzine (ATARAX) 50 MG tablet, Take by mouth., Disp: , Rfl:    levothyroxine (SYNTHROID) 75 MCG tablet, Take 1 tablet (75 mcg total) by mouth daily before breakfast., Disp: 90 tablet, Rfl: 3   montelukast (SINGULAIR) 10 MG tablet, Take 1 tablet (10 mg total) by mouth at bedtime., Disp: 30 tablet, Rfl: 5   nystatin (MYCOSTATIN/NYSTOP) powder, Apply 1 Application topically 3 (three) times daily., Disp: 60 g, Rfl: 3   omeprazole (PRILOSEC) 20 MG capsule, Take  1 capsule (20 mg total) by mouth 2 (two) times daily before a meal., Disp: 180 capsule, Rfl: 0   Probiotic Product (PROBIOTIC PO), Take by mouth., Disp: , Rfl:    topiramate (TOPAMAX) 50 MG tablet, Take 1 tablet (50 mg total) by mouth daily., Disp: 90 tablet, Rfl: 1   trimethoprim (TRIMPEX) 100 MG tablet, Take 1 tablet (100 mg total) by mouth daily., Disp: 30 tablet, Rfl: 11  Past Medical History: 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   Diverticulitis    Dry eye syndrome of both eyes 08/13/2021   Dyspnea on exertion 08/13/2021   Family history of problems related to stress 11/15/2021   Frequency of urination and polyuria 06/25/2022   GAD (generalized anxiety disorder) 08/15/2021   Glaucoma    H/O urinary incontinence 06/25/2022   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  High risk medication use 08/02/2018   HLD (hyperlipidemia)    Iron deficiency 12/04/2019   Isolation (social) 12/07/2021   Kidney stones    Microscopic hematuria    Mixed incontinence    Morbid obesity (HCC)    Ocular hypertension, 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   Ocular migraine 08/02/2018   Osteoarthritis    lumbar spine   Palpitations 08/13/2021   Plantar fasciitis 06/25/2023   Post covid-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, 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   Postoperative follow-up 03/30/2020   Prediabetes 08/14/2015   Pseudophakia, both eyes 06/26/2016   Rectal  bleeding 10/17/2021   Shortness of breath dyspnea    Sleep apnea    CPAP   Vaginal yeast infection    Wears dentures    partial upper    Tobacco Use: Social History   Tobacco Use  Smoking Status Never   Passive exposure: Never (Parent)  Smokeless Tobacco Never    Labs: Review Flowsheet       Latest Ref Rng & Units 08/14/2015 08/15/2015 11/20/2015 05/07/2023  Labs for ITP Cardiac and Pulmonary Rehab  Cholestrol 100 - 199 mg/dL - 098  119  147   LDL (calc) 0 - 99 mg/dL - 80  87  81   HDL-C >82 mg/dL - 32  38  43   Trlycerides 0 - 149 mg/dL - 956  213  086   Hemoglobin A1c 4.8 - 5.6 % 5.6  - - 5.9      Pulmonary Assessment Scores:  Pulmonary Assessment Scores     Row Name 08/25/23 1523         ADL UCSD   ADL Phase Entry     SOB Score total 15     Rest 0     Walk 2     Stairs 5     Bath 2     Dress 2     Shop 4       CAT Score   CAT Score 18       mMRC Score   mMRC Score 1              UCSD: Self-administered rating of dyspnea associated with activities of daily living (ADLs) 6-point scale (0 = "not at all" to 5 = "maximal or unable to do because of breathlessness")  Scoring Scores range from 0 to 120.  Minimally important difference is 5 units  CAT: CAT can identify the health impairment of COPD patients and is better correlated with disease progression.  CAT has a scoring range of zero to 40. The CAT score is classified into four groups of low (less than 10), medium (10 - 20), high (21-30) and very high (31-40) based on the impact level of disease on health status. A CAT score over 10 suggests significant symptoms.  A worsening CAT score could be explained by an exacerbation, poor medication adherence, poor inhaler technique, or progression of COPD or comorbid conditions.  CAT MCID is 2 points  mMRC: mMRC (Modified Medical Research Council) Dyspnea Scale is used to assess the degree of baseline functional disability in patients of respiratory disease  due to dyspnea. No minimal important difference is established. A decrease in score of 1 point or greater is considered a positive change.   Pulmonary Function Assessment:   Exercise Target Goals: Exercise Program Goal: Individual exercise prescription set using results from initial 6 min walk test and THRR while considering  patient's activity barriers and safety.   Exercise Prescription Goal: Initial exercise prescription builds to 30-45  minutes a day of aerobic activity, 2-3 days per week.  Home exercise guidelines will be given to patient during program as part of exercise prescription that the participant will acknowledge.  Education: Aerobic Exercise: - Group verbal and visual presentation on the components of exercise prescription. Introduces F.I.T.T principle from ACSM for exercise prescriptions.  Reviews F.I.T.T. principles of aerobic exercise including progression. Written material given at graduation. Flowsheet Row Pulmonary Rehab from 08/25/2023 in [facility] Cardiac and Pulmonary Rehab  Education need identified 08/25/23       Education: Resistance Exercise: - Group verbal and visual presentation on the components of exercise prescription. Introduces F.I.T.T principle from ACSM for exercise prescriptions  Reviews F.I.T.T. principles of resistance exercise including progression. Written material given at graduation.    Education: Exercise & Equipment Safety: - Individual verbal instruction and demonstration of equipment use and safety with use of the equipment. Flowsheet Row Pulmonary Rehab from 08/25/2023 in [facility] Cardiac and Pulmonary Rehab  Date 08/25/23  Educator [facility]  Instruction Review Code 1- Verbalizes Understanding       Education: Exercise Physiology & General Exercise Guidelines: - Group verbal and written instruction with models to review the exercise physiology of the cardiovascular system and associated critical values. Provides general exercise guidelines with  specific guidelines to those with heart or lung disease.    Education: Flexibility, Balance, Mind/Body Relaxation: - Group verbal and visual presentation with interactive activity on the components of exercise prescription. Introduces F.I.T.T principle from ACSM for exercise prescriptions. Reviews F.I.T.T. principles of flexibility and balance exercise training including progression. Also discusses the mind body connection.  Reviews various relaxation techniques to help reduce and manage stress (i.e. Deep breathing, progressive muscle relaxation, and visualization). Balance handout provided to take home. Written material given at graduation.   Activity Barriers & Risk Stratification:  Activity Barriers & Cardiac Risk Stratification - 08/25/23 1516       Activity Barriers & Cardiac Risk Stratification   Activity Barriers Shortness of Breath;Left Hip Replacement;Back Problems             6 Minute Walk:  6 Minute Walk     Row Name 08/25/23 1514         6 Minute Walk   Phase Initial     Distance 930 feet     Walk Time 6 minutes     MPH 1.76     METS 1.3     RPE 13     Perceived Dyspnea  1     VO2 Peak 4.5     Symptoms No     Resting HR 86 bpm     Resting BP 106/68     Resting Oxygen Saturation  96 %     Exercise Oxygen Saturation  during 6 min walk 93 %     Max Ex. HR 102 bpm     Max Ex. BP 126/74     2 Minute Post BP 106/64       Interval HR   1 Minute HR 98     2 Minute HR 101     3 Minute HR 102     4 Minute HR 68     5 Minute HR 65     6 Minute HR 96     2 Minute Post HR 87     Interval Heart Rate? Yes       Interval Oxygen   Interval Oxygen? Yes     Baseline Oxygen Saturation % 96 %  1 Minute Oxygen Saturation % 98 %     1 Minute Liters of Oxygen 1 L     2 Minute Oxygen Saturation % 93 %     2 Minute Liters of Oxygen 1 L     3 Minute Oxygen Saturation % 94 %     3 Minute Liters of Oxygen 1 L     4 Minute Oxygen Saturation % 93 %     4 Minute Liters  of Oxygen 1 L     5 Minute Oxygen Saturation % 94 %     5 Minute Liters of Oxygen 1 L     6 Minute Oxygen Saturation % 97 %     6 Minute Liters of Oxygen 1 L     2 Minute Post Oxygen Saturation % 96 %     2 Minute Post Liters of Oxygen 1 L             Oxygen Initial Assessment:  Oxygen Initial Assessment - 08/25/23 1521       Home Oxygen   Home Oxygen Device None      Initial 6 min Walk   Oxygen Used Continuous    Liters per minute 1      Program Oxygen Prescription   Program Oxygen Prescription Continuous    Liters per minute 1    Comments use as needed      Intervention   Short Term Goals To learn and exhibit compliance with exercise, home and travel O2 prescription;To learn and understand importance of monitoring SPO2 with pulse oximeter and demonstrate accurate use of the pulse oximeter.;To learn and understand importance of maintaining oxygen saturations>88%;To learn and demonstrate proper pursed lip breathing techniques or other breathing techniques. ;To learn and demonstrate proper use of respiratory medications    Long  Term Goals Exhibits compliance with exercise, home  and travel O2 prescription;Verbalizes importance of monitoring SPO2 with pulse oximeter and return demonstration;Maintenance of O2 saturations>88%;Exhibits proper breathing techniques, such as pursed lip breathing or other method taught during program session;Compliance with respiratory medication;Demonstrates proper use of MDI's             Oxygen Re-Evaluation:   Oxygen Discharge (Final Oxygen Re-Evaluation):   Initial Exercise Prescription:  Initial Exercise Prescription - 08/25/23 1500       Date of Initial Exercise RX and Referring Provider   Date 08/25/23    Referring Provider Dr. Luciano Cutter      Oxygen   Oxygen Continuous    Liters 1L    Maintain Oxygen Saturation 88% or higher      NuStep   Level 1    SPM 80    Minutes 15    METs 1.3      Arm Ergometer   Level 1     Watts 25    RPM 50    Minutes 15    METs 1.3      REL-XR   Level 1    Speed 50    Minutes 15    METs 1.3      Track   Laps 10    Minutes 15    METs 1.54      Prescription Details   Duration Progress to 30 minutes of continuous aerobic without signs/symptoms of physical distress      Intensity   THRR 40-80% of Max Heartrate 110-135    Ratings of Perceived Exertion 11-13    Perceived Dyspnea 0-4  Progression   Progression Continue to progress workloads to maintain intensity without signs/symptoms of physical distress.      Resistance Training   Training Prescription Yes    Weight 2lb    Reps 10-15             Perform Capillary Blood Glucose checks as needed.  Exercise Prescription Changes:   Exercise Prescription Changes     Row Name 08/25/23 1500             Response to Exercise   Blood Pressure (Admit) 106/68       Blood Pressure (Exercise) 126/74       Blood Pressure (Exit) 106/64       Heart Rate (Admit) 86 bpm       Heart Rate (Exercise) 102 bpm       Heart Rate (Exit) 87 bpm       Oxygen Saturation (Admit) 96 %       Oxygen Saturation (Exercise) 93 %       Oxygen Saturation (Exit) 96 %       Rating of Perceived Exertion (Exercise) 13       Perceived Dyspnea (Exercise) 1       Symptoms none       Comments results                Exercise Comments:   Exercise Goals and Review:   Exercise Goals     Row Name 08/25/23 1520             Exercise Goals   Increase Physical Activity Yes       Intervention Develop an individualized exercise prescription for aerobic and resistive training based on initial evaluation findings, risk stratification, comorbidities and participant's personal goals.;Provide advice, education, support and counseling about physical activity/exercise needs.       Expected Outcomes Long Term: Exercising regularly at least 3-5 days a week.;Long Term: Add in home exercise to make exercise part of routine  and to increase amount of physical activity.;Short Term: Attend rehab on a regular basis to increase amount of physical activity.       Increase Strength and Stamina Yes       Intervention Develop an individualized exercise prescription for aerobic and resistive training based on initial evaluation findings, risk stratification, comorbidities and participant's personal goals.;Provide advice, education, support and counseling about physical activity/exercise needs.       Expected Outcomes Long Term: Improve cardiorespiratory fitness, muscular endurance and strength as measured by increased METs and functional capacity ( );Short Term: Perform resistance training exercises routinely during rehab and add in resistance training at home;Short Term: Increase workloads from initial exercise prescription for resistance, speed, and METs.       Able to understand and use rate of perceived exertion (RPE) scale Yes       Intervention Provide education and explanation on how to use RPE scale       Expected Outcomes Long Term:  Able to use RPE to guide intensity level when exercising independently;Short Term: Able to use RPE daily in rehab to express subjective intensity level       Able to understand and use Dyspnea scale Yes       Intervention Provide education and explanation on how to use Dyspnea scale       Expected Outcomes Short Term: Able to use Dyspnea scale daily in rehab to express subjective sense of shortness of breath during exertion;Long Term: Able to use Dyspnea scale  to guide intensity level when exercising independently       Knowledge and understanding of Target Heart Rate Range (THRR) Yes       Intervention Provide education and explanation of THRR including how the numbers were predicted and where they are located for reference       Expected Outcomes Long Term: Able to use THRR to govern intensity when exercising independently;Short Term: Able to use daily as guideline for intensity in  rehab;Short Term: Able to state/look up THRR       Able to check pulse independently Yes       Intervention Review the importance of being able to check your own pulse for safety during independent exercise;Provide education and demonstration on how to check pulse in carotid and radial arteries.       Expected Outcomes Short Term: Able to explain why pulse checking is important during independent exercise;Long Term: Able to check pulse independently and accurately       Understanding of Exercise Prescription Yes       Intervention Provide education, explanation, and written materials on patient's individual exercise prescription       Expected Outcomes Long Term: Able to explain home exercise prescription to exercise independently;Short Term: Able to explain program exercise prescription                Exercise Goals Re-Evaluation :   Discharge Exercise Prescription (Final Exercise Prescription Changes):  Exercise Prescription Changes - 08/25/23 1500       Response to Exercise   Blood Pressure (Admit) 106/68    Blood Pressure (Exercise) 126/74    Blood Pressure (Exit) 106/64    Heart Rate (Admit) 86 bpm    Heart Rate (Exercise) 102 bpm    Heart Rate (Exit) 87 bpm    Oxygen Saturation (Admit) 96 %    Oxygen Saturation (Exercise) 93 %    Oxygen Saturation (Exit) 96 %    Rating of Perceived Exertion (Exercise) 13    Perceived Dyspnea (Exercise) 1    Symptoms none    Comments results             Nutrition:  Target Goals: Understanding of nutrition guidelines, daily intake of sodium 1500mg , cholesterol 200mg , calories 30% from fat and 7% or less from saturated fats, daily to have 5 or more servings of fruits and vegetables.  Education: All About Nutrition: -Group instruction provided by verbal, written material, interactive activities, discussions, models, and posters to present general guidelines for heart healthy nutrition including fat, fiber, MyPlate, the role of  sodium in heart healthy nutrition, utilization of the nutrition label, and utilization of this knowledge for meal planning. Follow up email sent as well. Written material given at graduation. Flowsheet Row Pulmonary Rehab from 08/25/2023 in [facility] Cardiac and Pulmonary Rehab  Education need identified 08/25/23       Biometrics:  Pre Biometrics - 08/25/23 1520       Pre Biometrics   Height 5' 2.1" (1.577 m)    Weight 231 lb 12.8 oz (105.1 kg)    Waist Circumference 48 inches    Hip Circumference 51 inches    Waist to Hip Ratio 0.94 %    BMI (Calculated) 42.28    Single Leg Stand 3.5 seconds              Nutrition Therapy Plan and Nutrition Goals:  Nutrition Therapy & Goals - 08/24/23 1340       Intervention Plan   Intervention  Prescribe, educate and counsel regarding individualized specific dietary modifications aiming towards targeted core components such as weight, hypertension, lipid management, diabetes, heart failure and other comorbidities.    Expected Outcomes Short Term Goal: A plan has been developed with personal nutrition goals set during dietitian appointment.;Short Term Goal: Understand basic principles of dietary content, such as calories, fat, sodium, cholesterol and nutrients.;Long Term Goal: Adherence to prescribed nutrition plan.             Nutrition Assessments:  MEDIFICTS Score Key: >=70 Need to make dietary changes  40-70 Heart Healthy Diet <= 40 Therapeutic Level Cholesterol Diet  Flowsheet Row Pulmonary Rehab from 08/25/2023 in [facility] Cardiac and Pulmonary Rehab  Picture Your Plate Total Score on Admission 53      Picture Your Plate Scores: <13 Unhealthy dietary pattern with much room for improvement. 41-50 Dietary pattern unlikely to meet recommendations for good health and room for improvement. 51-60 More healthful dietary pattern, with some room for improvement.  >60 Healthy dietary pattern, although there may be some specific behaviors  that could be improved.   Nutrition Goals Re-Evaluation:   Nutrition Goals Discharge (Final Nutrition Goals Re-Evaluation):   Psychosocial: Target Goals: Acknowledge presence or absence of significant depression and/or stress, maximize coping skills, provide positive support system. Participant is able to verbalize types and ability to use techniques and skills needed for reducing stress and depression.   Education: Stress, Anxiety, and Depression - Group verbal and visual presentation to define topics covered.  Reviews how body is impacted by stress, anxiety, and depression.  Also discusses healthy ways to reduce stress and to treat/manage anxiety and depression.  Written material given at graduation.   Education: Sleep Hygiene -Provides group verbal and written instruction about how sleep can affect your health.  Define sleep hygiene, discuss sleep cycles and impact of sleep habits. Review good sleep hygiene tips.    Initial Review & Psychosocial Screening:  Initial Psych Review & Screening - 08/24/23 1337       Initial Review   Current issues with Current Psychotropic Meds;Current Depression   had to lose 100 lbs off to get knee surgery, very stressful     Family Dynamics   Good Support System? Yes   daughter  in [place]     Barriers   Psychosocial barriers to participate in program There are no identifiable barriers or psychosocial needs.      Screening Interventions   Interventions Encouraged to exercise;Provide feedback about the scores to participant;To provide support and resources with identified psychosocial needs    Expected Outcomes Short Term goal: Utilizing psychosocial counselor, staff and physician to assist with identification of specific Stressors or current issues interfering with healing process. Setting desired goal for each stressor or current issue identified.;Long Term Goal: Stressors or current issues are controlled or eliminated.;Short Term goal:  Identification and review with participant of any Quality of Life or Depression concerns found by scoring the questionnaire.;Long Term goal: The participant improves quality of Life and PHQ9 Scores as seen by post scores and/or verbalization of changes             Quality of Life Scores:  Scores of 19 and below usually indicate a poorer quality of life in these areas.  A difference of  2-3 points is a clinically meaningful difference.  A difference of 2-3 points in the total score of the Quality of Life Index has been associated with significant improvement in overall quality of life, self-image, physical symptoms,  and general health in studies assessing change in quality of life.  PHQ-9: Review Flowsheet  More data exists      08/26/2023 08/25/2023 07/20/2023 05/07/2023 03/12/2016  Depression screen PHQ 2/9  Decreased Interest 0 0 0 3 0  Down, Depressed, Hopeless 0 0 0 0 0  PHQ - 2 Score 0 0 0 3 0  Altered sleeping 0 0 0 3 -  Tired, decreased energy 0 0 0 3 -  Change in appetite 0 0 0 3 -  Feeling bad or failure about yourself  0 0 0 0 -  Trouble concentrating 0 0 0 1 -  Moving slowly or fidgety/restless 0 0 0 0 -  Suicidal thoughts 0 0 0 0 -  PHQ-9 Score 0 0 0 13 -  Difficult doing work/chores Not difficult at all Not difficult at all Not difficult at all Not difficult at all -   Interpretation of Total Score  Total Score Depression Severity:  1-4 = Minimal depression, 5-9 = Mild depression, 10-14 = Moderate depression, 15-19 = Moderately severe depression, 20-27 = Severe depression   Psychosocial Evaluation and Intervention:  Psychosocial Evaluation - 08/24/23 1451       Psychosocial Evaluation & Interventions   Comments Faith Padilla has no barriers to starting the program.  She is ready to start an exercise program . She lives alone and has a 35 year old cat. Her daughter (in [place])is her support. She recently moved to [facility] and a month later had a hip replacement. She  is healing well.    Expected Outcomes STG attend all scheduled sesions, continue to heal from her surgery and is able to progress her exercise. LTG continues with exercise progression after discharge    Continue Psychosocial Services  Follow up required by staff             Psychosocial Re-Evaluation:   Psychosocial Discharge (Final Psychosocial Re-Evaluation):   Education: Education Goals: Education classes will be provided on a weekly basis, covering required topics. Participant will state understanding/return demonstration of topics presented.  Learning Barriers/Preferences:  Learning Barriers/Preferences - 08/24/23 1341       Learning Barriers/Preferences   Learning Barriers None    Learning Preferences None             General Pulmonary Education Topics:  Infection Prevention: - Provides verbal and written material to individual with discussion of infection control including proper hand washing and proper equipment cleaning during exercise session. Flowsheet Row Pulmonary Rehab from 08/25/2023 in [facility] Cardiac and Pulmonary Rehab  Date 08/25/23  Educator [facility]  Instruction Review Code 1- Verbalizes Understanding       Falls Prevention: - Provides verbal and written material to individual with discussion of falls prevention and safety. Flowsheet Row Pulmonary Rehab from 08/25/2023 in [facility] Cardiac and Pulmonary Rehab  Date 08/25/23  Educator [facility]  Instruction Review Code 1- Verbalizes Understanding       Chronic Lung Disease Review: - Group verbal instruction with posters, models, PowerPoint presentations and videos,  to review new updates, new respiratory medications, new advancements in procedures and treatments. Providing information on websites and "800" numbers for continued self-education. Includes information about supplement oxygen, available portable oxygen systems, continuous and intermittent flow rates, oxygen safety, concentrators, and Medicare  reimbursement for oxygen. Explanation of Pulmonary Drugs, including class, frequency, complications, importance of spacers, rinsing mouth after steroid MDI's, and proper cleaning methods for nebulizers. Review of basic lung anatomy and physiology related to function, structure, and complications  of lung disease. Review of risk factors. Discussion about methods for diagnosing sleep apnea and types of masks and machines for OSA. Includes a review of the use of types of environmental controls: home humidity, furnaces, filters, dust mite/pet prevention, HEPA vacuums. Discussion about weather changes, air quality and the benefits of nasal washing. Instruction on Warning signs, infection symptoms, calling MD promptly, preventive modes, and value of vaccinations. Review of effective airway clearance, coughing and/or vibration techniques. Emphasizing that all should Create an Action Plan. Written material given at graduation. Flowsheet Row Pulmonary Rehab from 08/25/2023 in [facility] Cardiac and Pulmonary Rehab  Education need identified 08/25/23       AED/CPR: - Group verbal and written instruction with the use of models to demonstrate the basic use of the AED with the basic ABC's of resuscitation.    Anatomy and Cardiac Procedures: - Group verbal and visual presentation and models provide information about basic cardiac anatomy and function. Reviews the testing methods done to diagnose heart disease and the outcomes of the test results. Describes the treatment choices: Medical Management, Angioplasty, or Coronary Bypass Surgery for treating various heart conditions including Myocardial Infarction, Angina, Valve Disease, and Cardiac Arrhythmias.  Written material given at graduation.   Medication Safety: - Group verbal and visual instruction to review commonly prescribed medications for heart and lung disease. Reviews the medication, class of the drug, and side effects. Includes the steps to properly store meds  and maintain the prescription regimen.  Written material given at graduation.   Other: -Provides group and verbal instruction on various topics (see comments)   Knowledge Questionnaire Score:  Knowledge Questionnaire Score - 08/25/23 1525       Knowledge Questionnaire Score   Pre Score 14/18              Core Components/Risk Factors/Patient Goals at Admission:  Personal Goals and Risk Factors at Admission - 08/24/23 1343       Core Components/Risk Factors/Patient Goals on Admission    Weight Management Yes;Weight Maintenance    Intervention Weight Management: Develop a combined nutrition and exercise program designed to reach desired caloric intake, while maintaining appropriate intake of nutrient and fiber, sodium and fats, and appropriate energy expenditure required for the weight goal.;Weight Management: Provide education and appropriate resources to help participant work on and attain dietary goals.    Admit Weight 230 lb (104.3 kg)   weighed 290 lb prior to knee surgery worked down to 223 lb   Goal Weight: Long Term 230 lb (104.3 kg)    Expected Outcomes Short Term: Continue to assess and modify interventions until short term weight is achieved;Long Term: Adherence to nutrition and physical activity/exercise program aimed toward attainment of established weight goal;Weight Maintenance: Understanding of the daily nutrition guidelines, which includes 25-35% calories from fat, 7% or less cal from saturated fats, less than 200mg  cholesterol, less than 1.5gm of sodium, & 5 or more servings of fruits and vegetables daily    Improve shortness of breath with ADL's Yes    Intervention Provide education, individualized exercise plan and daily activity instruction to help decrease symptoms of SOB with activities of daily living.    Expected Outcomes Short Term: Improve cardiorespiratory fitness to achieve a reduction of symptoms when performing ADLs;Long Term: Be able to perform more ADLs  without symptoms or delay the onset of symptoms    Increase knowledge of respiratory medications and ability to use respiratory devices properly  Yes    Intervention Provide education and  demonstration as needed of appropriate use of medications, inhalers, and oxygen therapy.    Expected Outcomes Short Term: Achieves understanding of medications use. Understands that oxygen is a medication prescribed by physician. Demonstrates appropriate use of inhaler and oxygen therapy.;Long Term: Maintain appropriate use of medications, inhalers, and oxygen therapy.    Lipids Yes    Intervention Provide education and support for participant on nutrition & aerobic/resistive exercise along with prescribed medications to achieve LDL 70mg , HDL >40mg .    Expected Outcomes Short Term: Participant states understanding of desired cholesterol values and is compliant with medications prescribed. Participant is following exercise prescription and nutrition guidelines.;Long Term: Cholesterol controlled with medications as prescribed, with individualized exercise RX and with personalized nutrition plan. Value goals: LDL < 70mg , HDL > 40 mg.             Education:Diabetes - Individual verbal and written instruction to review signs/symptoms of diabetes, desired ranges of glucose level fasting, after meals and with exercise. Acknowledge that pre and post exercise glucose checks will be done for 3 sessions at entry of program.   Know Your Numbers and Heart Failure: - Group verbal and visual instruction to discuss disease risk factors for cardiac and pulmonary disease and treatment options.  Reviews associated critical values for Overweight/Obesity, Hypertension, Cholesterol, and Diabetes.  Discusses basics of heart failure: signs/symptoms and treatments.  Introduces Heart Failure Zone chart for action plan for heart failure.  Written material given at graduation.   Core Components/Risk Factors/Patient Goals Review:     Core Components/Risk Factors/Patient Goals at Discharge (Final Review):    ITP Comments:  ITP Comments     Row Name 08/24/23 1354 08/25/23 1513 08/26/23 1211       ITP Comments Virtual orientation call completed today. shehas an appointment on Date: 08/25/2023  for EP eval and gym Orientation.  Documentation of diagnosis can be found in [facility] Date: 08/11/2023 . Completed and gym orientation. Initial ITP created and sent for review to Dr. Jinny Sanders, Medical Director. 30 Day review completed. Medical Director ITP review done, changes made as directed, and signed approval by Medical Director.    new to program orientation only completed              Comments:

## 2023-08-27 ENCOUNTER — Encounter: Payer: 59 | Admitting: *Deleted

## 2023-08-27 DIAGNOSIS — J8410 Pulmonary fibrosis, unspecified: Secondary | ICD-10-CM | POA: Diagnosis not present

## 2023-08-27 DIAGNOSIS — R0600 Dyspnea, unspecified: Secondary | ICD-10-CM

## 2023-08-27 NOTE — Progress Notes (Signed)
Daily Session Note  Patient Details  Name: Faith Padilla MRN: [id number] Date of Birth: [birth year] Referring Provider:   Flowsheet Row Pulmonary Rehab from 08/25/2023 in [facility] Cardiac and Pulmonary Rehab  Referring Provider Dr. Luciano Cutter       Encounter Date: 08/27/2023  Check In:  Session Check In - 08/27/23 1441       Check-In   Supervising physician immediately available to respond to emergencies See telemetry face sheet for immediately available ER MD    Location [facility]-Cardiac & Pulmonary Rehab    Staff Present Cora Collum, RN, BSN, CCRP;Joseph Hood, RCP,RRT,BSRT;Maxon Ca[place] BS, , Exercise Physiologist;Asante Ritacco Katrinka Blazing, RN, [place]    Virtual Visit No    Medication changes reported     No    Fall or balance concerns reported    No    Warm-up and Cool-down Performed on first and last piece of equipment    Resistance Training Performed Yes    VAD Patient? No    PAD/SET Patient? No      Pain Assessment   Currently in Pain? No/denies                Social History   Tobacco Use  Smoking Status Never   Passive exposure: Never (Parent)  Smokeless Tobacco Never    Goals Met:  Independence with exercise equipment Exercise tolerated well No report of concerns or symptoms today Strength training completed today  Goals Unmet:  Not Applicable  Comments: First full day of exercise!  Patient was oriented to gym and equipment including functions, settings, policies, and procedures.  Patient's individual exercise prescription and treatment plan were reviewed.  All starting workloads were established based on the results of the 6 minute walk test done at initial orientation visit.  The plan for exercise progression was also introduced and progression will be customized based on patient's performance and goals.    Dr. Bethann Punches is Medical Director for [facility] Cardiac Rehabilitation.  Dr. Vida Rigger is Medical Director for [facility] Pulmonary Rehabilitation.

## 2023-08-28 ENCOUNTER — Encounter (HOSPITAL_BASED_OUTPATIENT_CLINIC_OR_DEPARTMENT_OTHER): Payer: Self-pay | Admitting: Pulmonary Disease

## 2023-08-28 ENCOUNTER — Telehealth (HOSPITAL_BASED_OUTPATIENT_CLINIC_OR_DEPARTMENT_OTHER): Payer: Self-pay

## 2023-08-28 DIAGNOSIS — M1711 Unilateral primary osteoarthritis, right knee: Secondary | ICD-10-CM

## 2023-08-28 HISTORY — DX: Unilateral primary osteoarthritis, right knee: M17.11

## 2023-08-28 NOTE — Telephone Encounter (Signed)
Called patient to see if she received oxygen yet. LMOM.

## 2023-08-28 NOTE — Telephone Encounter (Signed)
Please contact patient next week to ensure she has oxygen.

## 2023-08-28 NOTE — Telephone Encounter (Signed)
Patient sent message that said she had received oxygen.

## 2023-08-31 ENCOUNTER — Encounter: Payer: 59 | Admitting: *Deleted

## 2023-08-31 DIAGNOSIS — R0600 Dyspnea, unspecified: Secondary | ICD-10-CM

## 2023-08-31 DIAGNOSIS — J8410 Pulmonary fibrosis, unspecified: Secondary | ICD-10-CM

## 2023-08-31 NOTE — Progress Notes (Signed)
Daily Session Note  Patient Details  Name: Faith Padilla MRN: [id number] Date of Birth: Apr 11, 1951 Referring Provider:   Flowsheet Row Pulmonary Rehab from 08/25/2023 in [facility] Cardiac and Pulmonary Rehab  Referring Provider Dr. Luciano Cutter       Encounter Date: 08/31/2023  Check In:  Session Check In - 08/31/23 1405       Check-In   Supervising physician immediately available to respond to emergencies See telemetry face sheet for immediately available ER MD    Location [facility]-Cardiac & Pulmonary Rehab    Staff Present Susann Givens, RN BSN;Joseph Reino Kent, RCP,RRT,BSRT;Kristen Coble, RN,BC,MSN;Noah [place], [place], Exercise Physiologist    Virtual Visit No    Medication changes reported     No    Fall or balance concerns reported    No    Warm-up and Cool-down Performed on first and last piece of equipment    Resistance Training Performed Yes    VAD Patient? No    PAD/SET Patient? No      Pain Assessment   Currently in Pain? No/denies                Social History   Tobacco Use  Smoking Status Never   Passive exposure: Never (Parent)  Smokeless Tobacco Never    Goals Met:  Independence with exercise equipment Exercise tolerated well No report of concerns or symptoms today Strength training completed today  Goals Unmet:  Not Applicable  Comments: Pt able to follow exercise prescription today without complaint.  Will continue to monitor for progression.    Dr. Bethann Punches is Medical Director for [facility] Cardiac Rehabilitation.  Dr. Vida Rigger is Medical Director for [facility] Pulmonary Rehabilitation.

## 2023-09-01 ENCOUNTER — Other Ambulatory Visit: Payer: Self-pay

## 2023-09-03 ENCOUNTER — Encounter: Payer: 59 | Admitting: *Deleted

## 2023-09-03 DIAGNOSIS — R0600 Dyspnea, unspecified: Secondary | ICD-10-CM

## 2023-09-03 DIAGNOSIS — J8410 Pulmonary fibrosis, unspecified: Secondary | ICD-10-CM

## 2023-09-03 NOTE — Progress Notes (Signed)
Daily Session Note  Patient Details  Name: Faith Padilla MRN: [id number] Date of Birth: 1951-01-20 Referring Provider:   Flowsheet Row Pulmonary Rehab from 08/25/2023 in [facility] Cardiac and Pulmonary Rehab  Referring Provider Dr. Luciano Cutter       Encounter Date: 09/03/2023  Check In:  Session Check In - 09/03/23 1417       Check-In   Supervising physician immediately available to respond to emergencies See telemetry face sheet for immediately available ER MD    Location [facility]-Cardiac & Pulmonary Rehab    Staff Present Ronette Deter, BS, Exercise Physiologist;Joseph [place], RCP,RRT,BSRT;Maxon [place] BS, , Exercise Physiologist;Leroi Haque Katrinka Blazing, RN, ADN    Virtual Visit No    Medication changes reported     No    Fall or balance concerns reported    No    Warm-up and Cool-down Performed on first and last piece of equipment    Resistance Training Performed Yes    VAD Patient? No    PAD/SET Patient? No      Pain Assessment   Currently in Pain? No/denies                Social History   Tobacco Use  Smoking Status Never   Passive exposure: Never (Parent)  Smokeless Tobacco Never    Goals Met:  Independence with exercise equipment Exercise tolerated well No report of concerns or symptoms today Strength training completed today  Goals Unmet:  Not Applicable  Comments: Pt able to follow exercise prescription today without complaint.  Will continue to monitor for progression.    Dr. Bethann Punches is Medical Director for [facility] Cardiac Rehabilitation.  Dr. Vida Rigger is Medical Director for [facility] Pulmonary Rehabilitation.

## 2023-09-07 ENCOUNTER — Ambulatory Visit: Payer: 59 | Admitting: Physician Assistant

## 2023-09-07 ENCOUNTER — Ambulatory Visit: Payer: 59

## 2023-09-10 ENCOUNTER — Encounter: Payer: 59 | Attending: Pulmonary Disease | Admitting: *Deleted

## 2023-09-10 DIAGNOSIS — R0600 Dyspnea, unspecified: Secondary | ICD-10-CM | POA: Insufficient documentation

## 2023-09-10 DIAGNOSIS — J8410 Pulmonary fibrosis, unspecified: Secondary | ICD-10-CM | POA: Diagnosis present

## 2023-09-10 NOTE — Progress Notes (Signed)
 Daily Session Note  Patient Details  Name: Faith Padilla MRN: [id number] Date of Birth: February 28, 1951 Referring Provider:   Flowsheet Row Pulmonary Rehab from 08/25/2023 in [facility] Cardiac and Pulmonary Rehab  Referring Provider Dr. Acquanetta Slater Staff       Encounter Date: 09/10/2023  Check In:  Session Check In - 09/10/23 1445       Check-In   Supervising physician immediately available to respond to emergencies See telemetry face sheet for immediately available ER MD    Location [facility]-Cardiac & Pulmonary Rehab    Staff Present Hoy Rodney, RN BSN;Noah Tickle, BS, Exercise Physiologist;Maxon Conetta BS, , Exercise Physiologist;Tynesha Free Claudene, RN, ADN    Virtual Visit No    Medication changes reported     No    Fall or balance concerns reported    No    Warm-up and Cool-down Performed on first and last piece of equipment    Resistance Training Performed Yes    VAD Patient? No    PAD/SET Patient? No      Pain Assessment   Currently in Pain? No/denies                Social History   Tobacco Use  Smoking Status Never   Passive exposure: Never (Parent)  Smokeless Tobacco Never    Goals Met:  Independence with exercise equipment Exercise tolerated well No report of concerns or symptoms today Strength training completed today  Goals Unmet:  Not Applicable  Comments: Pt able to follow exercise prescription today without complaint.  Will continue to monitor for progression.    Dr. Oneil Pinal is Medical Director for [facility] Cardiac Rehabilitation.  Dr. Fuad Aleskerov is Medical Director for [facility] Pulmonary Rehabilitation.

## 2023-09-14 ENCOUNTER — Ambulatory Visit: Payer: 59

## 2023-09-16 ENCOUNTER — Encounter: Payer: 59 | Admitting: *Deleted

## 2023-09-16 DIAGNOSIS — J8410 Pulmonary fibrosis, unspecified: Secondary | ICD-10-CM | POA: Diagnosis not present

## 2023-09-16 NOTE — Progress Notes (Signed)
 Daily Session Note  Patient Details  Name: Faith Padilla MRN: [id number] Date of Birth: [birth year] Referring Provider:   Flowsheet Row Pulmonary Rehab from 08/25/2023 in [facility] Cardiac and Pulmonary Rehab  Referring Provider Dr. Acquanetta Slater Staff       Encounter Date: 09/16/2023  Check In:  Session Check In - 09/16/23 1430       Check-In   Supervising physician immediately available to respond to emergencies See telemetry face sheet for immediately available ER MD    Location [facility]-Cardiac & Pulmonary Rehab    Staff Present Hoy Rodney, RN Monico Hint, BS, ACSM CEP, Exercise Physiologist;Susanne Bice, RN, BSN, CCRP;Megan Claudene, RN, ADN    Virtual Visit No    Medication changes reported     No    Fall or balance concerns reported    No    Warm-up and Cool-down Performed on first and last piece of equipment    Resistance Training Performed Yes    VAD Patient? No    PAD/SET Patient? No      Pain Assessment   Currently in Pain? No/denies                Social History   Tobacco Use  Smoking Status Never   Passive exposure: Never (Parent)  Smokeless Tobacco Never    Goals Met:  Independence with exercise equipment Exercise tolerated well No report of concerns or symptoms today Strength training completed today  Goals Unmet:  Not Applicable  Comments: Pt able to follow exercise prescription today without complaint.  Will continue to monitor for progression.    Dr. Oneil Pinal is Medical Director for [facility] Cardiac Rehabilitation.  Dr. Fuad Aleskerov is Medical Director for [facility] Pulmonary Rehabilitation.

## 2023-09-17 ENCOUNTER — Encounter: Payer: 59 | Admitting: *Deleted

## 2023-09-17 DIAGNOSIS — J8410 Pulmonary fibrosis, unspecified: Secondary | ICD-10-CM | POA: Diagnosis not present

## 2023-09-17 DIAGNOSIS — R0600 Dyspnea, unspecified: Secondary | ICD-10-CM

## 2023-09-17 NOTE — Progress Notes (Signed)
 Daily Session Note  Patient Details  Name: Faith Padilla MRN: [id number] Date of Birth: 1951-02-09 Referring Provider:   Flowsheet Row Pulmonary Rehab from 08/25/2023 in [facility] Cardiac and Pulmonary Rehab  Referring Provider Dr. Acquanetta Slater Staff       Encounter Date: 09/17/2023  Check In:  Session Check In - 09/17/23 1451       Check-In   Supervising physician immediately available to respond to emergencies See telemetry face sheet for immediately available ER MD    Location [facility]-Cardiac & Pulmonary Rehab    Staff Present Devaughn Jaeger, BS, Exercise Physiologist;Meredith Tressa, RN BSN;Joseph Rolinda [place] Arzella Claudene, RN, [place]    Virtual Visit No    Medication changes reported     No    Fall or balance concerns reported    No    Warm-up and Cool-down Performed on first and last piece of equipment    Resistance Training Performed Yes    VAD Patient? No    PAD/SET Patient? No      Pain Assessment   Currently in Pain? No/denies                Social History   Tobacco Use  Smoking Status Never   Passive exposure: Never (Parent)  Smokeless Tobacco Never    Goals Met:  Independence with exercise equipment Exercise tolerated well No report of concerns or symptoms today Strength training completed today  Goals Unmet:  Not Applicable  Comments: Pt able to follow exercise prescription today without complaint.  Will continue to monitor for progression.    Dr. Oneil Pinal is Medical Director for [facility] Cardiac Rehabilitation.  Dr. Fuad Aleskerov is Medical Director for [facility] Pulmonary Rehabilitation.

## 2023-09-21 ENCOUNTER — Encounter: Payer: 59 | Admitting: *Deleted

## 2023-09-21 DIAGNOSIS — J8410 Pulmonary fibrosis, unspecified: Secondary | ICD-10-CM

## 2023-09-21 DIAGNOSIS — R0600 Dyspnea, unspecified: Secondary | ICD-10-CM

## 2023-09-21 NOTE — Progress Notes (Signed)
 Daily Session Note  Patient Details  Name: Faith Padilla MRN: [id number] Date of Birth: 13-Sep-1950 Referring Provider:   Flowsheet Row Pulmonary Rehab from 08/25/2023 in [facility] Cardiac and Pulmonary Rehab  Referring Provider Dr. Acquanetta Slater Staff       Encounter Date: 09/21/2023  Check In:  Session Check In - 09/21/23 1405       Check-In   Supervising physician immediately available to respond to emergencies See telemetry face sheet for immediately available ER MD    Location [facility]-Cardiac & Pulmonary Rehab    Staff Present Hoy Rodney, RN BSN;Joseph Rolinda, RCP,RRT,BSRT;Maxon [place], , Exercise Physiologist;Laureen Delores, BS, RRT, CPFT    Virtual Visit No    Medication changes reported     No    Fall or balance concerns reported    No    Warm-up and Cool-down Performed on first and last piece of equipment    Resistance Training Performed Yes    VAD Patient? No    PAD/SET Patient? No      Pain Assessment   Currently in Pain? No/denies                Social History   Tobacco Use  Smoking Status Never   Passive exposure: Never (Parent)  Smokeless Tobacco Never    Goals Met:  Independence with exercise equipment Exercise tolerated well No report of concerns or symptoms today Strength training completed today  Goals Unmet:  Not Applicable  Comments: Pt able to follow exercise prescription today without complaint.  Will continue to monitor for progression.    Dr. Oneil Pinal is Medical Director for [facility] Cardiac Rehabilitation.  Dr. Fuad Aleskerov is Medical Director for [facility] Pulmonary Rehabilitation.
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
## 2023-09-23 ENCOUNTER — Encounter: Payer: Self-pay | Admitting: *Deleted

## 2023-09-23 ENCOUNTER — Encounter: Payer: 59 | Admitting: *Deleted

## 2023-09-23 DIAGNOSIS — J8410 Pulmonary fibrosis, unspecified: Secondary | ICD-10-CM

## 2023-09-23 NOTE — Progress Notes (Signed)
 Pulmonary Individual Treatment Plan  Patient Details  Name: Faith Padilla MRN: [id number] Date of Birth: 24-Aug-1951 Referring Provider:   Flowsheet Row Pulmonary Rehab from 08/25/2023 in [facility] Cardiac and Pulmonary Rehab  Referring Provider Dr. Quillian Brunt       Initial Encounter Date:  Flowsheet Row Pulmonary Rehab from 08/25/2023 in [facility] Cardiac and Pulmonary Rehab  Date 08/25/23       Visit Diagnosis: Pulmonary fibrosis (HCC)  Patient's Home Medications on Admission:  Current Outpatient Medications:    albuterol  (VENTOLIN  HFA) 108 (90 Base) MCG/ACT inhaler, Inhale 1-2 puffs into the lungs every 6 (six) hours as needed for wheezing or shortness of breath., Disp: 18 g, Rfl: 1   atorvastatin  (LIPITOR) 20 MG tablet, TAKE 1 TABLET(20 MG) BY MOUTH AT BEDTIME, Disp: 90 tablet, Rfl: 0   Biotin 1 MG CAPS, Take 1 capsule by mouth daily., Disp: , Rfl:    CALCIUM  PO, Take by mouth., Disp: , Rfl:    cetirizine  (ZYRTEC ) 10 MG tablet, Take 1 tablet (10 mg total) by mouth at bedtime., Disp: 30 tablet, Rfl: 3   DULoxetine  (CYMBALTA ) 60 MG capsule, Take 1 capsule (60 mg total) by mouth daily. Reported on 12/26/2015, Disp: 90 capsule, Rfl: 3   fluticasone  (FLONASE ) 50 MCG/ACT nasal spray, Place 2 sprays into both nostrils daily., Disp: 16 g, Rfl: 5   Gabapentin , Once-Daily, 300 MG TABS, Take 0.5 tablets (150 mg total) by mouth at bedtime. (Patient not taking: Reported on 08/26/2023), Disp: 45 tablet, Rfl: 1   hydrOXYzine  (ATARAX ) 50 MG tablet, Take by mouth., Disp: , Rfl:    levothyroxine  (SYNTHROID ) 75 MCG tablet, Take 1 tablet (75 mcg total) by mouth daily before breakfast., Disp: 90 tablet, Rfl: 3   montelukast  (SINGULAIR ) 10 MG tablet, Take 1 tablet (10 mg total) by mouth at bedtime., Disp: 30 tablet, Rfl: 5   nystatin  (MYCOSTATIN /NYSTOP ) powder, Apply 1 Application topically 3 (three) times daily., Disp: 60 g, Rfl: 3   omeprazole  (PRILOSEC) 20 MG capsule, Take 1 capsule (20 mg total) by  mouth 2 (two) times daily before a meal., Disp: 180 capsule, Rfl: 0   Probiotic Product (PROBIOTIC PO), Take by mouth., Disp: , Rfl:    topiramate  (TOPAMAX ) 50 MG tablet, Take 1 tablet (50 mg total) by mouth daily., Disp: 90 tablet, Rfl: 1   trimethoprim  (TRIMPEX ) 100 MG tablet, Take 1 tablet (100 mg total) by mouth daily., Disp: 30 tablet, Rfl: 11  Past Medical History: 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   Diverticulitis    Dry eye syndrome of both eyes 08/13/2021   Dyspnea on exertion 08/13/2021   Family history of problems related to stress 11/15/2021   Frequency of urination and polyuria 06/25/2022   GAD (generalized anxiety disorder) 08/15/2021   Glaucoma    H/O urinary incontinence 06/25/2022   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   High risk  medication use 08/02/2018   HLD (hyperlipidemia)    Iron deficiency 12/04/2019   Isolation (social) 12/07/2021   Kidney stones    Microscopic hematuria    Mixed incontinence    Morbid obesity (HCC)    Ocular hypertension, 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   Ocular migraine 08/02/2018   Osteoarthritis    lumbar spine   Osteoarthritis of right knee 08/28/2023   Palpitations 08/13/2021   Plantar fasciitis 06/25/2023   Post covid-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, 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   Postoperative follow-up 03/30/2020   Prediabetes 08/14/2015   Pseudophakia, both eyes  06/26/2016   Rectal bleeding 10/17/2021   Shortness of breath dyspnea    Sleep apnea    CPAP   Vaginal yeast infection    Wears dentures    partial upper    Tobacco Use: Social History   Tobacco Use  Smoking Status Never   Passive exposure: Never (Parent)  Smokeless Tobacco Never    Labs: Review Flowsheet       Latest Ref Rng & Units 08/14/2015 08/15/2015 11/20/2015 05/07/2023  Labs for ITP Cardiac and Pulmonary Rehab  Cholestrol 100 - 199 mg/dL - 161  096  045   LDL (calc) 0 - 99 mg/dL - 80  87  81   HDL-C >40 mg/dL - 32  38  43   Trlycerides 0 - 149 mg/dL - 981  191  478   Hemoglobin A1c 4.8 - 5.6 % 5.6  - - 5.9      Pulmonary Assessment Scores:  Pulmonary Assessment Scores     Row Name 08/25/23 1523         ADL UCSD   ADL Phase Entry     SOB Score total 15     Rest 0     Walk 2     Stairs 5     Bath 2     Dress 2     Shop 4       CAT Score   CAT Score 18       mMRC Score   mMRC Score 1              UCSD: Self-administered rating of dyspnea associated with activities of daily living (ADLs) 6-point scale (0 = "not at all" to 5 = "maximal or unable to do because of breathlessness")  Scoring Scores range from 0 to 120.  Minimally important difference is 5 units  CAT: CAT can identify the health impairment of COPD patients and is better correlated with disease progression.  CAT has a scoring range of zero to 40. The CAT score is classified into four groups of low (less than 10), medium (10 - 20), high (21-30) and very high (31-40) based on the impact level of disease on health status. A CAT score over 10 suggests significant symptoms.  A worsening CAT score could be explained by an exacerbation, poor medication adherence, poor inhaler technique, or progression of COPD or comorbid conditions.  CAT MCID is 2 points  mMRC: mMRC (Modified Medical Research Council) Dyspnea Scale is used to assess the degree of baseline functional disability in patients of  respiratory disease due to dyspnea. No minimal important difference is established. A decrease in score of 1 point or greater is considered a positive change.   Pulmonary Function Assessment:   Exercise Target Goals: Exercise Program Goal: Individual exercise prescription set using results from initial 6 min walk test and THRR while considering  patient's activity barriers and safety.   Exercise Prescription Goal: Initial  exercise prescription builds to 30-45 minutes a day of aerobic activity, 2-3 days per week.  Home exercise guidelines will be given to patient during program as part of exercise prescription that the participant will acknowledge.  Education: Aerobic Exercise: - Group verbal and visual presentation on the components of exercise prescription. Introduces F.I.T.T principle from ACSM for exercise prescriptions.  Reviews F.I.T.T. principles of aerobic exercise including progression. Written material given at graduation. Flowsheet Row Pulmonary Rehab from 08/25/2023 in [facility] Cardiac and Pulmonary Rehab  Education need identified 08/25/23       Education: Resistance Exercise: - Group verbal and visual presentation on the components of exercise prescription. Introduces F.I.T.T principle from ACSM for exercise prescriptions  Reviews F.I.T.T. principles of resistance exercise including progression. Written material given at graduation.    Education: Exercise & Equipment Safety: - Individual verbal instruction and demonstration of equipment use and safety with use of the equipment. Flowsheet Row Pulmonary Rehab from 08/25/2023 in [facility] Cardiac and Pulmonary Rehab  Date 08/25/23  Educator [facility]  Instruction Review Code 1- Verbalizes Understanding       Education: Exercise Physiology & General Exercise Guidelines: - Group verbal and written instruction with models to review the exercise physiology of the cardiovascular system and associated critical values. Provides general  exercise guidelines with specific guidelines to those with heart or lung disease.    Education: Flexibility, Balance, Mind/Body Relaxation: - Group verbal and visual presentation with interactive activity on the components of exercise prescription. Introduces F.I.T.T principle from ACSM for exercise prescriptions. Reviews F.I.T.T. principles of flexibility and balance exercise training including progression. Also discusses the mind body connection.  Reviews various relaxation techniques to help reduce and manage stress (i.e. Deep breathing, progressive muscle relaxation, and visualization). Balance handout provided to take home. Written material given at graduation.   Activity Barriers & Risk Stratification:  Activity Barriers & Cardiac Risk Stratification - 08/25/23 1516       Activity Barriers & Cardiac Risk Stratification   Activity Barriers Shortness of Breath;Left Hip Replacement;Back Problems             6 Minute Walk:  6 Minute Walk     Row Name 08/25/23 1514         6 Minute Walk   Phase Initial     Distance 930 feet     Walk Time 6 minutes     MPH 1.76     METS 1.3     RPE 13     Perceived Dyspnea  1     VO2 Peak 4.5     Symptoms No     Resting HR 86 bpm     Resting BP 106/68     Resting Oxygen Saturation  96 %     Exercise Oxygen Saturation  during 6 min walk 93 %     Max Ex. HR 102 bpm     Max Ex. BP 126/74     2 Minute Post BP 106/64       Interval HR   1 Minute HR 98     2 Minute HR 101     3 Minute HR 102     4 Minute HR 68     5 Minute HR 65     6 Minute HR 96     2 Minute Post HR 87     Interval Heart Rate? Yes       Interval Oxygen   Interval Oxygen? Yes     Baseline Oxygen  Saturation % 96 %     1 Minute Oxygen Saturation % 98 %     1 Minute Liters of Oxygen 1 L     2 Minute Oxygen Saturation % 93 %     2 Minute Liters of Oxygen 1 L     3 Minute Oxygen Saturation % 94 %     3 Minute Liters of Oxygen 1 L     4 Minute Oxygen Saturation %  93 %     4 Minute Liters of Oxygen 1 L     5 Minute Oxygen Saturation % 94 %     5 Minute Liters of Oxygen 1 L     6 Minute Oxygen Saturation % 97 %     6 Minute Liters of Oxygen 1 L     2 Minute Post Oxygen Saturation % 96 %     2 Minute Post Liters of Oxygen 1 L             Oxygen Initial Assessment:  Oxygen Initial Assessment - 08/25/23 1521       Home Oxygen   Home Oxygen Device None      Initial 6 min Walk   Oxygen Used Continuous    Liters per minute 1      Program Oxygen Prescription   Program Oxygen Prescription Continuous    Liters per minute 1    Comments use as needed      Intervention   Short Term Goals To learn and exhibit compliance with exercise, home and travel O2 prescription;To learn and understand importance of monitoring SPO2 with pulse oximeter and demonstrate accurate use of the pulse oximeter.;To learn and understand importance of maintaining oxygen saturations>88%;To learn and demonstrate proper pursed lip breathing techniques or other breathing techniques. ;To learn and demonstrate proper use of respiratory medications    Long  Term Goals Exhibits compliance with exercise, home  and travel O2 prescription;Verbalizes importance of monitoring SPO2 with pulse oximeter and return demonstration;Maintenance of O2 saturations>88%;Exhibits proper breathing techniques, such as pursed lip breathing or other method taught during program session;Compliance with respiratory medication;Demonstrates proper use of MDI's             Oxygen Re-Evaluation:  Oxygen Re-Evaluation     Row Name 08/27/23 1456 08/31/23 1411           Program Oxygen Prescription   Program Oxygen Prescription -- Continuous      Liters per minute -- 1      Comments -- use as needed        Home Oxygen   Home Oxygen Device -- Portable Concentrator;Home Concentrator      Sleep Oxygen Prescription -- CPAP      Liters per minute -- 0      Home Exercise Oxygen Prescription --  Continuous      Liters per minute -- 1  PRN      Home Resting Oxygen Prescription -- None      Compliance with Home Oxygen Use -- Yes        Goals/Expected Outcomes   Short Term Goals -- To learn and demonstrate proper pursed lip breathing techniques or other breathing techniques.       Long  Term Goals -- Exhibits proper breathing techniques, such as pursed lip breathing or other method taught during program session      Comments Reviewed PLB technique with pt.  Talked about how it works and it's importance in maintaining their exercise saturations.  Informed patient how to perform the Pursed Lipped breathing technique. Told patient to Inhale through the nose and out the mouth with pursed lips to keep their airways open, help oxygenate them better, practice when at rest or doing strenuous activity. Patient Verbalizes understanding of technique and will work on and be reiterated during [facility].      Goals/Expected Outcomes Short: Become more profiecient at using PLB. Long: Become independent at using PLB. Short: use PLB with exertion. Long: use PLB on exertion proficiently and independently.               Oxygen Discharge (Final Oxygen Re-Evaluation):  Oxygen Re-Evaluation - 08/31/23 1411       Program Oxygen Prescription   Program Oxygen Prescription Continuous    Liters per minute 1    Comments use as needed      Home Oxygen   Home Oxygen Device Portable Concentrator;Home Concentrator    Sleep Oxygen Prescription CPAP    Liters per minute 0    Home Exercise Oxygen Prescription Continuous    Liters per minute 1   PRN   Home Resting Oxygen Prescription None    Compliance with Home Oxygen Use Yes      Goals/Expected Outcomes   Short Term Goals To learn and demonstrate proper pursed lip breathing techniques or other breathing techniques.     Long  Term Goals Exhibits proper breathing techniques, such as pursed lip breathing or other method taught during program session     Comments Informed patient how to perform the Pursed Lipped breathing technique. Told patient to Inhale through the nose and out the mouth with pursed lips to keep their airways open, help oxygenate them better, practice when at rest or doing strenuous activity. Patient Verbalizes understanding of technique and will work on and be reiterated during [facility].    Goals/Expected Outcomes Short: use PLB with exertion. Long: use PLB on exertion proficiently and independently.             Initial Exercise Prescription:  Initial Exercise Prescription - 08/25/23 1500       Date of Initial Exercise RX and Referring Provider   Date 08/25/23    Referring Provider Dr. Quillian Brunt      Oxygen   Oxygen Continuous    Liters 1L    Maintain Oxygen Saturation 88% or higher      NuStep   Level 1    SPM 80    Minutes 15    METs 1.3      Arm Ergometer   Level 1    Watts 25    RPM 50    Minutes 15    METs 1.3      REL-XR   Level 1    Speed 50    Minutes 15    METs 1.3      Track   Laps 10    Minutes 15    METs 1.54      Prescription Details   Duration Progress to 30 minutes of continuous aerobic without signs/symptoms of physical distress      Intensity   THRR 40-80% of Max Heartrate 110-135    Ratings of Perceived Exertion 11-13    Perceived Dyspnea 0-4      Progression   Progression Continue to progress workloads to maintain intensity without signs/symptoms of physical distress.      Resistance Training   Training Prescription Yes    Weight 2lb    Reps 10-15  Perform Capillary Blood Glucose checks as needed.  Exercise Prescription Changes:   Exercise Prescription Changes     Row Name 08/25/23 1500 09/10/23 1200           Response to Exercise   Blood Pressure (Admit) 106/68 104/62      Blood Pressure (Exercise) 126/74 128/72      Blood Pressure (Exit) 106/64 108/62      Heart Rate (Admit) 86 bpm 86 bpm      Heart Rate (Exercise) 102 bpm  101 bpm      Heart Rate (Exit) 87 bpm 92 bpm      Oxygen Saturation (Admit) 96 % 94 %      Oxygen Saturation (Exercise) 93 % 92 %      Oxygen Saturation (Exit) 96 % 92 %      Rating of Perceived Exertion (Exercise) 13 13      Perceived Dyspnea (Exercise) 1 0      Symptoms none none      Comments results First two weeks of exercise      Duration -- Progress to 30 minutes of  aerobic without signs/symptoms of physical distress      Intensity -- THRR unchanged        Progression   Progression -- Continue to progress workloads to maintain intensity without signs/symptoms of physical distress.      Average METs -- 2.12        Resistance Training   Training Prescription -- Yes      Weight -- 2lb      Reps -- 10-15        Interval Training   Interval Training -- No        Oxygen   Oxygen -- Continuous      Liters -- 0-1L        NuStep   Level -- 2      Minutes -- 15      METs -- 2.9        Oxygen   Maintain Oxygen Saturation -- 88% or higher               Exercise Comments:   Exercise Comments     Row Name 08/27/23 1455           Exercise Comments First full day of exercise!  Patient was oriented to gym and equipment including functions, settings, policies, and procedures.  Patient's individual exercise prescription and treatment plan were reviewed.  All starting workloads were established based on the results of the 6 minute walk test done at initial orientation visit.  The plan for exercise progression was also introduced and progression will be customized based on patient's performance and goals.                Exercise Goals and Review:   Exercise Goals     Row Name 08/25/23 1520             Exercise Goals   Increase Physical Activity Yes       Intervention Develop an individualized exercise prescription for aerobic and resistive training based on initial evaluation findings, risk stratification, comorbidities and participant's personal  goals.;Provide advice, education, support and counseling about physical activity/exercise needs.       Expected Outcomes Long Term: Exercising regularly at least 3-5 days a week.;Long Term: Add in home exercise to make exercise part of routine and to increase amount of physical activity.;Short Term: Attend rehab on a regular basis to  increase amount of physical activity.       Increase Strength and Stamina Yes       Intervention Develop an individualized exercise prescription for aerobic and resistive training based on initial evaluation findings, risk stratification, comorbidities and participant's personal goals.;Provide advice, education, support and counseling about physical activity/exercise needs.       Expected Outcomes Long Term: Improve cardiorespiratory fitness, muscular endurance and strength as measured by increased METs and functional capacity ( );Short Term: Perform resistance training exercises routinely during rehab and add in resistance training at home;Short Term: Increase workloads from initial exercise prescription for resistance, speed, and METs.       Able to understand and use rate of perceived exertion (RPE) scale Yes       Intervention Provide education and explanation on how to use RPE scale       Expected Outcomes Long Term:  Able to use RPE to guide intensity level when exercising independently;Short Term: Able to use RPE daily in rehab to express subjective intensity level       Able to understand and use Dyspnea scale Yes       Intervention Provide education and explanation on how to use Dyspnea scale       Expected Outcomes Short Term: Able to use Dyspnea scale daily in rehab to express subjective sense of shortness of breath during exertion;Long Term: Able to use Dyspnea scale to guide intensity level when exercising independently       Knowledge and understanding of Target Heart Rate Range (THRR) Yes       Intervention Provide education and explanation of THRR  including how the numbers were predicted and where they are located for reference       Expected Outcomes Long Term: Able to use THRR to govern intensity when exercising independently;Short Term: Able to use daily as guideline for intensity in rehab;Short Term: Able to state/look up THRR       Able to check pulse independently Yes       Intervention Review the importance of being able to check your own pulse for safety during independent exercise;Provide education and demonstration on how to check pulse in carotid and radial arteries.       Expected Outcomes Short Term: Able to explain why pulse checking is important during independent exercise;Long Term: Able to check pulse independently and accurately       Understanding of Exercise Prescription Yes       Intervention Provide education, explanation, and written materials on patient's individual exercise prescription       Expected Outcomes Long Term: Able to explain home exercise prescription to exercise independently;Short Term: Able to explain program exercise prescription                Exercise Goals Re-Evaluation :  Exercise Goals Re-Evaluation     Row Name 08/27/23 1455 09/10/23 1219           Exercise Goal Re-Evaluation   Exercise Goals Review Able to understand and use rate of perceived exertion (RPE) scale;Able to understand and use Dyspnea scale;Knowledge and understanding of Target Heart Rate Range (THRR);Understanding of Exercise Prescription Increase Physical Activity;Increase Strength and Stamina;Understanding of Exercise Prescription      Comments Reviewed RPE and dyspnea scale, THR and program prescription with pt today.  Pt voiced understanding and was given a copy of goals to take home. Annique is off to a great start in the program. She has been getting comfortable with her prescribed exercise  machines, and the program itself. She was able to increase her level on the T4 nustep to level 2. We will continue to monitor her  progress in the program.      Expected Outcomes Short: Use RPE daily to regulate intensity. Long: Follow program prescription in THR. Short: Continue to follow current exercise prescription. Long: Continue exercise to improve strength and stamina.               Discharge Exercise Prescription (Final Exercise Prescription Changes):  Exercise Prescription Changes - 09/10/23 1200       Response to Exercise   Blood Pressure (Admit) 104/62    Blood Pressure (Exercise) 128/72    Blood Pressure (Exit) 108/62    Heart Rate (Admit) 86 bpm    Heart Rate (Exercise) 101 bpm    Heart Rate (Exit) 92 bpm    Oxygen Saturation (Admit) 94 %    Oxygen Saturation (Exercise) 92 %    Oxygen Saturation (Exit) 92 %    Rating of Perceived Exertion (Exercise) 13    Perceived Dyspnea (Exercise) 0    Symptoms none    Comments First two weeks of exercise    Duration Progress to 30 minutes of  aerobic without signs/symptoms of physical distress    Intensity THRR unchanged      Progression   Progression Continue to progress workloads to maintain intensity without signs/symptoms of physical distress.    Average METs 2.12      Resistance Training   Training Prescription Yes    Weight 2lb    Reps 10-15      Interval Training   Interval Training No      Oxygen   Oxygen Continuous    Liters 0-1L      NuStep   Level 2    Minutes 15    METs 2.9      Oxygen   Maintain Oxygen Saturation 88% or higher             Nutrition:  Target Goals: Understanding of nutrition guidelines, daily intake of sodium 1500mg , cholesterol 200mg , calories 30% from fat and 7% or less from saturated fats, daily to have 5 or more servings of fruits and vegetables.  Education: All About Nutrition: -Group instruction provided by verbal, written material, interactive activities, discussions, models, and posters to present general guidelines for heart healthy nutrition including fat, fiber, MyPlate, the role of sodium  in heart healthy nutrition, utilization of the nutrition label, and utilization of this knowledge for meal planning. Follow up email sent as well. Written material given at graduation. Flowsheet Row Pulmonary Rehab from 08/25/2023 in [facility] Cardiac and Pulmonary Rehab  Education need identified 08/25/23       Biometrics:  Pre Biometrics - 08/25/23 1520       Pre Biometrics   Height 5' 2.1" (1.577 m)    Weight 231 lb 12.8 oz (105.1 kg)    Waist Circumference 48 inches    Hip Circumference 51 inches    Waist to Hip Ratio 0.94 %    BMI (Calculated) 42.28    Single Leg Stand 3.5 seconds              Nutrition Therapy Plan and Nutrition Goals:  Nutrition Therapy & Goals - 08/24/23 1340       Intervention Plan   Intervention Prescribe, educate and counsel regarding individualized specific dietary modifications aiming towards targeted core components such as weight, hypertension, lipid management, diabetes, heart failure and other comorbidities.  Expected Outcomes Short Term Goal: A plan has been developed with personal nutrition goals set during dietitian appointment.;Short Term Goal: Understand basic principles of dietary content, such as calories, fat, sodium, cholesterol and nutrients.;Long Term Goal: Adherence to prescribed nutrition plan.             Nutrition Assessments:  MEDIFICTS Score Key: >=70 Need to make dietary changes  40-70 Heart Healthy Diet <= 40 Therapeutic Level Cholesterol Diet  Flowsheet Row Pulmonary Rehab from 08/25/2023 in [facility] Cardiac and Pulmonary Rehab  Picture Your Plate Total Score on Admission 53      Picture Your Plate Scores: <96 Unhealthy dietary pattern with much room for improvement. 41-50 Dietary pattern unlikely to meet recommendations for good health and room for improvement. 51-60 More healthful dietary pattern, with some room for improvement.  >60 Healthy dietary pattern, although there may be some specific behaviors that  could be improved.   Nutrition Goals Re-Evaluation:  Nutrition Goals Re-Evaluation     Row Name 08/31/23 1414             Goals   Current Weight 234 lb (106.1 kg)       Comment Patient was informed on why it is important to maintain a balanced diet when dealing with Respiratory issues. Explained that it takes a lot of energy to breath and when they are short of breath often they will need to have a good diet to help keep up with the calories they are expending for breathing.       Expected Outcome Short: Choose and plan snacks accordingly to patients caloric intake to improve breathing. Long: Maintain a diet independently that meets their caloric intake to aid in daily shortness of breath.                Nutrition Goals Discharge (Final Nutrition Goals Re-Evaluation):  Nutrition Goals Re-Evaluation - 08/31/23 1414       Goals   Current Weight 234 lb (106.1 kg)    Comment Patient was informed on why it is important to maintain a balanced diet when dealing with Respiratory issues. Explained that it takes a lot of energy to breath and when they are short of breath often they will need to have a good diet to help keep up with the calories they are expending for breathing.    Expected Outcome Short: Choose and plan snacks accordingly to patients caloric intake to improve breathing. Long: Maintain a diet independently that meets their caloric intake to aid in daily shortness of breath.             Psychosocial: Target Goals: Acknowledge presence or absence of significant depression and/or stress, maximize coping skills, provide positive support system. Participant is able to verbalize types and ability to use techniques and skills needed for reducing stress and depression.   Education: Stress, Anxiety, and Depression - Group verbal and visual presentation to define topics covered.  Reviews how body is impacted by stress, anxiety, and depression.  Also discusses healthy ways to  reduce stress and to treat/manage anxiety and depression.  Written material given at graduation.   Education: Sleep Hygiene -Provides group verbal and written instruction about how sleep can affect your health.  Define sleep hygiene, discuss sleep cycles and impact of sleep habits. Review good sleep hygiene tips.    Initial Review & Psychosocial Screening:  Initial Psych Review & Screening - 08/24/23 1337       Initial Review   Current issues with Current  Psychotropic Meds;Current Depression   had to lose 100 lbs off to get knee surgery, very stressful     Family Dynamics   Good Support System? Yes   daughter  in [place]     Barriers   Psychosocial barriers to participate in program There are no identifiable barriers or psychosocial needs.      Screening Interventions   Interventions Encouraged to exercise;Provide feedback about the scores to participant;To provide support and resources with identified psychosocial needs    Expected Outcomes Short Term goal: Utilizing psychosocial counselor, staff and physician to assist with identification of specific Stressors or current issues interfering with healing process. Setting desired goal for each stressor or current issue identified.;Long Term Goal: Stressors or current issues are controlled or eliminated.;Short Term goal: Identification and review with participant of any Quality of Life or Depression concerns found by scoring the questionnaire.;Long Term goal: The participant improves quality of Life and PHQ9 Scores as seen by post scores and/or verbalization of changes             Quality of Life Scores:  Scores of 19 and below usually indicate a poorer quality of life in these areas.  A difference of  2-3 points is a clinically meaningful difference.  A difference of 2-3 points in the total score of the Quality of Life Index has been associated with significant improvement in overall quality of life, self-image, physical symptoms,  and general health in studies assessing change in quality of life.  PHQ-9: Review Flowsheet  More data exists      08/26/2023 08/25/2023 07/20/2023 05/07/2023 03/12/2016  Depression screen PHQ 2/9  Decreased Interest 0 0 0 3 0  Down, Depressed, Hopeless 0 0 0 0 0  PHQ - 2 Score 0 0 0 3 0  Altered sleeping 0 0 0 3 -  Tired, decreased energy 0 0 0 3 -  Change in appetite 0 0 0 3 -  Feeling bad or failure about yourself  0 0 0 0 -  Trouble concentrating 0 0 0 1 -  Moving slowly or fidgety/restless 0 0 0 0 -  Suicidal thoughts 0 0 0 0 -  PHQ-9 Score 0 0 0 13 -  Difficult doing work/chores Not difficult at all Not difficult at all Not difficult at all Not difficult at all -   Interpretation of Total Score  Total Score Depression Severity:  1-4 = Minimal depression, 5-9 = Mild depression, 10-14 = Moderate depression, 15-19 = Moderately severe depression, 20-27 = Severe depression   Psychosocial Evaluation and Intervention:  Psychosocial Evaluation - 08/24/23 1451       Psychosocial Evaluation & Interventions   Comments Jaylenne has no barriers to starting the program.  She is ready to start an exercise program . She lives alone and has a 31 year old cat. Her daughter (in [place])is her support. She recently moved to [facility] and a month later had a hip replacement. She is healing well.    Expected Outcomes STG attend all scheduled sesions, continue to heal from her surgery and is able to progress her exercise. LTG continues with exercise progression after discharge    Continue Psychosocial Services  Follow up required by staff             Psychosocial Re-Evaluation:  Psychosocial Re-Evaluation     Row Name 08/31/23 1415             Psychosocial Re-Evaluation   Current issues with Current Psychotropic Meds  Comments Her kids did not tell her that her ex husband had died on Thanksgiving this year. She takes some medications to help her mood and has been upset since  hearing that news. Her daughter lives in [place] and is a good family support system.       Expected Outcomes Short: Attend [facility] stress management education to decrease stress. Long: Maintain exercise Post [facility] to keep stress at a minimum.       Interventions Encouraged to attend Pulmonary Rehabilitation for the exercise       Continue Psychosocial Services  Follow up required by staff                Psychosocial Discharge (Final Psychosocial Re-Evaluation):  Psychosocial Re-Evaluation - 08/31/23 1415       Psychosocial Re-Evaluation   Current issues with Current Psychotropic Meds    Comments Her kids did not tell her that her ex husband had died on Thanksgiving this year. She takes some medications to help her mood and has been upset since hearing that news. Her daughter lives in [place] and is a good family support system.    Expected Outcomes Short: Attend [facility] stress management education to decrease stress. Long: Maintain exercise Post [facility] to keep stress at a minimum.    Interventions Encouraged to attend Pulmonary Rehabilitation for the exercise    Continue Psychosocial Services  Follow up required by staff             Education: Education Goals: Education classes will be provided on a weekly basis, covering required topics. Participant will state understanding/return demonstration of topics presented.  Learning Barriers/Preferences:  Learning Barriers/Preferences - 08/24/23 1341       Learning Barriers/Preferences   Learning Barriers None    Learning Preferences None             General Pulmonary Education Topics:  Infection Prevention: - Provides verbal and written material to individual with discussion of infection control including proper hand washing and proper equipment cleaning during exercise session. Flowsheet Row Pulmonary Rehab from 08/25/2023 in [facility] Cardiac and Pulmonary Rehab  Date 08/25/23  Educator [facility]  Instruction  Review Code 1- Verbalizes Understanding       Falls Prevention: - Provides verbal and written material to individual with discussion of falls prevention and safety. Flowsheet Row Pulmonary Rehab from 08/25/2023 in [facility] Cardiac and Pulmonary Rehab  Date 08/25/23  Educator [facility]  Instruction Review Code 1- Verbalizes Understanding       Chronic Lung Disease Review: - Group verbal instruction with posters, models, PowerPoint presentations and videos,  to review new updates, new respiratory medications, new advancements in procedures and treatments. Providing information on websites and "800" numbers for continued self-education. Includes information about supplement oxygen, available portable oxygen systems, continuous and intermittent flow rates, oxygen safety, concentrators, and Medicare reimbursement for oxygen. Explanation of Pulmonary Drugs, including class, frequency, complications, importance of spacers, rinsing mouth after steroid MDI's, and proper cleaning methods for nebulizers. Review of basic lung anatomy and physiology related to function, structure, and complications of lung disease. Review of risk factors. Discussion about methods for diagnosing sleep apnea and types of masks and machines for OSA. Includes a review of the use of types of environmental controls: home humidity, furnaces, filters, dust mite/pet prevention, HEPA vacuums. Discussion about weather changes, air quality and the benefits of nasal washing. Instruction on Warning signs, infection symptoms, calling MD promptly, preventive modes, and value of vaccinations. Review of effective airway clearance, coughing  and/or vibration techniques. Emphasizing that all should Create an Action Plan. Written material given at graduation. Flowsheet Row Pulmonary Rehab from 08/25/2023 in [facility] Cardiac and Pulmonary Rehab  Education need identified 08/25/23       AED/CPR: - Group verbal and written instruction with the use of models to  demonstrate the basic use of the AED with the basic ABC's of resuscitation.    Anatomy and Cardiac Procedures: - Group verbal and visual presentation and models provide information about basic cardiac anatomy and function. Reviews the testing methods done to diagnose heart disease and the outcomes of the test results. Describes the treatment choices: Medical Management, Angioplasty, or Coronary Bypass Surgery for treating various heart conditions including Myocardial Infarction, Angina, Valve Disease, and Cardiac Arrhythmias.  Written material given at graduation.   Medication Safety: - Group verbal and visual instruction to review commonly prescribed medications for heart and lung disease. Reviews the medication, class of the drug, and side effects. Includes the steps to properly store meds and maintain the prescription regimen.  Written material given at graduation.   Other: -Provides group and verbal instruction on various topics (see comments)   Knowledge Questionnaire Score:  Knowledge Questionnaire Score - 08/25/23 1525       Knowledge Questionnaire Score   Pre Score 14/18              Core Components/Risk Factors/Patient Goals at Admission:  Personal Goals and Risk Factors at Admission - 08/24/23 1343       Core Components/Risk Factors/Patient Goals on Admission    Weight Management Yes;Weight Maintenance    Intervention Weight Management: Develop a combined nutrition and exercise program designed to reach desired caloric intake, while maintaining appropriate intake of nutrient and fiber, sodium and fats, and appropriate energy expenditure required for the weight goal.;Weight Management: Provide education and appropriate resources to help participant work on and attain dietary goals.    Admit Weight 230 lb (104.3 kg)   weighed 290 lb prior to knee surgery worked down to 223 lb   Goal Weight: Long Term 230 lb (104.3 kg)    Expected Outcomes Short Term: Continue to assess  and modify interventions until short term weight is achieved;Long Term: Adherence to nutrition and physical activity/exercise program aimed toward attainment of established weight goal;Weight Maintenance: Understanding of the daily nutrition guidelines, which includes 25-35% calories from fat, 7% or less cal from saturated fats, less than 200mg  cholesterol, less than 1.5gm of sodium, & 5 or more servings of fruits and vegetables daily    Improve shortness of breath with ADL's Yes    Intervention Provide education, individualized exercise plan and daily activity instruction to help decrease symptoms of SOB with activities of daily living.    Expected Outcomes Short Term: Improve cardiorespiratory fitness to achieve a reduction of symptoms when performing ADLs;Long Term: Be able to perform more ADLs without symptoms or delay the onset of symptoms    Increase knowledge of respiratory medications and ability to use respiratory devices properly  Yes    Intervention Provide education and demonstration as needed of appropriate use of medications, inhalers, and oxygen therapy.    Expected Outcomes Short Term: Achieves understanding of medications use. Understands that oxygen is a medication prescribed by physician. Demonstrates appropriate use of inhaler and oxygen therapy.;Long Term: Maintain appropriate use of medications, inhalers, and oxygen therapy.    Lipids Yes    Intervention Provide education and support for participant on nutrition & aerobic/resistive exercise along with prescribed medications  to achieve LDL 70mg , HDL >40mg .    Expected Outcomes Short Term: Participant states understanding of desired cholesterol values and is compliant with medications prescribed. Participant is following exercise prescription and nutrition guidelines.;Long Term: Cholesterol controlled with medications as prescribed, with individualized exercise RX and with personalized nutrition plan. Value goals: LDL < 70mg , HDL > 40  mg.             Education:Diabetes - Individual verbal and written instruction to review signs/symptoms of diabetes, desired ranges of glucose level fasting, after meals and with exercise. Acknowledge that pre and post exercise glucose checks will be done for 3 sessions at entry of program.   Know Your Numbers and Heart Failure: - Group verbal and visual instruction to discuss disease risk factors for cardiac and pulmonary disease and treatment options.  Reviews associated critical values for Overweight/Obesity, Hypertension, Cholesterol, and Diabetes.  Discusses basics of heart failure: signs/symptoms and treatments.  Introduces Heart Failure Zone chart for action plan for heart failure.  Written material given at graduation.   Core Components/Risk Factors/Patient Goals Review:   Goals and Risk Factor Review     Row Name 08/31/23 1413             Core Components/Risk Factors/Patient Goals Review   Personal Goals Review Improve shortness of breath with ADL's       Review Spoke to patient about their shortness of breath and what they can do to improve. Patient has been informed of breathing techniques when starting the program. Patient is informed to tell staff if they have had any med changes and that certain meds they are taking or not taking can be causing shortness of breath.       Expected Outcomes Short: Attend [facility] regularly to improve shortness of breath with ADL's. Long: maintain independence with ADL's                Core Components/Risk Factors/Patient Goals at Discharge (Final Review):   Goals and Risk Factor Review - 08/31/23 1413       Core Components/Risk Factors/Patient Goals Review   Personal Goals Review Improve shortness of breath with ADL's    Review Spoke to patient about their shortness of breath and what they can do to improve. Patient has been informed of breathing techniques when starting the program. Patient is informed to tell staff if they  have had any med changes and that certain meds they are taking or not taking can be causing shortness of breath.    Expected Outcomes Short: Attend [facility] regularly to improve shortness of breath with ADL's. Long: maintain independence with ADL's             ITP Comments:  ITP Comments     Row Name 08/24/23 1354 08/25/23 1513 08/26/23 1211 08/27/23 1455 09/23/23 1313   ITP Comments Virtual orientation call completed today. shehas an appointment on Date: 08/25/2023  for EP eval and gym Orientation.  Documentation of diagnosis [facility] in [facility] Date: 08/11/2023 . Completed and gym orientation. Initial ITP created and sent for review to Dr. Faud Aleskerov, Medical Director. 30 Day review completed. Medical Director ITP review done, changes made as directed, and signed approval by Medical Director.    new to program orientation only completed First full day of exercise!  Patient was oriented to gym and equipment including functions, settings, policies, and procedures.  Patient's individual exercise prescription and treatment plan were reviewed.  All starting workloads were established based on  the results of the 6 minute walk test done at initial orientation visit.  The plan for exercise progression was also introduced and progression will be customized based on patient's performance and goals. 30 Day review completed. Medical Director ITP review done, changes made as directed, and signed approval by Medical Director.    new to program            Comments:

## 2023-09-23 NOTE — Progress Notes (Signed)
 Daily Session Note  Patient Details  Name: Faith Padilla MRN: [id number] Date of Birth: [birth year] Referring Provider:   Flowsheet Row Pulmonary Rehab from 08/25/2023 in [facility] Cardiac and Pulmonary Rehab  Referring Provider Dr. Quillian Brunt       Encounter Date: 09/23/2023  Check In:  Session Check In - 09/23/23 1429       Check-In   Supervising physician immediately available to respond to emergencies See telemetry face sheet for immediately available ER MD    Location [facility]-Cardiac & Pulmonary Rehab    Staff Present Maxon Conetta BS, Exercise Physiologist;Laureen Bevin Bucks, BS, RRT, CPFT;Joseph Hood RCP,RRT,BSRT;Elizjah Noblet Manson Seitz RN,BSN    Virtual Visit No    Medication changes reported     No    Fall or balance concerns reported    No    Warm-up and Cool-down Performed on first and last piece of equipment    Resistance Training Performed Yes    VAD Patient? No    PAD/SET Patient? No      Pain Assessment   Currently in Pain? No/denies                Social History   Tobacco Use  Smoking Status Never   Passive exposure: Never (Parent)  Smokeless Tobacco Never    Goals Met:  Independence with exercise equipment Exercise tolerated well No report of concerns or symptoms today Strength training completed today  Goals Unmet:  Not Applicable  Comments: Pt able to follow exercise prescription today without complaint.  Will continue to monitor for progression.    Dr. Firman Hughes is Medical Director for [facility] Cardiac Rehabilitation.  Dr. Fuad Aleskerov is Medical Director for [facility] Pulmonary Rehabilitation.

## 2023-09-24 ENCOUNTER — Encounter: Payer: 59 | Admitting: *Deleted

## 2023-09-24 ENCOUNTER — Other Ambulatory Visit: Payer: Self-pay | Admitting: Family Medicine

## 2023-09-24 DIAGNOSIS — R0600 Dyspnea, unspecified: Secondary | ICD-10-CM

## 2023-09-24 DIAGNOSIS — J8410 Pulmonary fibrosis, unspecified: Secondary | ICD-10-CM

## 2023-09-24 DIAGNOSIS — K219 Gastro-esophageal reflux disease without esophagitis: Secondary | ICD-10-CM

## 2023-09-24 NOTE — Progress Notes (Signed)
Daily Session Note  Patient Details  Name: Faith Padilla MRN: [id number] Date of Birth: [birth year] Referring Provider:   Flowsheet Row Pulmonary Rehab from 08/25/2023 in [facility] Cardiac and Pulmonary Rehab  Referring Provider Dr. Luciano Cutter       Encounter Date: 09/24/2023  Check In:  Session Check In - 09/24/23 1436       Check-In   Supervising physician immediately available to respond to emergencies See telemetry face sheet for immediately available ER MD    Location [facility]-Cardiac & Pulmonary Rehab    Staff Present Cora Collum, RN, BSN, CCRP;Joseph Hood RCP,RRT,BSRT;Noah Tickle, [place], Exercise Physiologist;Maxon Conetta BS, Exercise Physiologist    Virtual Visit No    Medication changes reported     No    Fall or balance concerns reported    No    Warm-up and Cool-down Performed on first and last piece of equipment    Resistance Training Performed Yes    VAD Patient? No    PAD/SET Patient? No      Pain Assessment   Currently in Pain? No/denies                Social History   Tobacco Use  Smoking Status Never   Passive exposure: Never (Parent)  Smokeless Tobacco Never    Goals Met:  Proper associated with RPD/PD & O2 Sat Independence with exercise equipment Exercise tolerated well No report of concerns or symptoms today  Goals Unmet:  Not Applicable  Comments: Pt able to follow exercise prescription today without complaint.  Will continue to monitor for progression.    Dr. Bethann Punches is Medical Director for [facility] Cardiac Rehabilitation.  Dr. Vida Rigger is Medical Director for [facility] Pulmonary Rehabilitation.

## 2023-09-28 ENCOUNTER — Encounter: Payer: Self-pay | Admitting: Psychiatry

## 2023-09-28 ENCOUNTER — Ambulatory Visit: Payer: 59

## 2023-09-28 ENCOUNTER — Ambulatory Visit (INDEPENDENT_AMBULATORY_CARE_PROVIDER_SITE_OTHER): Payer: 59 | Admitting: Psychiatry

## 2023-09-28 VITALS — BP 127/84 | HR 87 | Ht 61.0 in | Wt 237.0 lb

## 2023-09-28 DIAGNOSIS — F4310 Post-traumatic stress disorder, unspecified: Secondary | ICD-10-CM

## 2023-09-28 DIAGNOSIS — F3341 Major depressive disorder, recurrent, in partial remission: Secondary | ICD-10-CM | POA: Diagnosis not present

## 2023-09-28 NOTE — Patient Instructions (Signed)
Continue duloxetine 60 mg daily  Hold hydroxyzine  Referral to therapy  Next appointment: 3/18 at 11 AM

## 2023-10-05 ENCOUNTER — Encounter: Payer: 59 | Admitting: *Deleted

## 2023-10-05 DIAGNOSIS — R0600 Dyspnea, unspecified: Secondary | ICD-10-CM

## 2023-10-05 DIAGNOSIS — J8410 Pulmonary fibrosis, unspecified: Secondary | ICD-10-CM

## 2023-10-05 NOTE — Progress Notes (Signed)
Daily Session Note  Patient Details  Name: Faith Padilla MRN: [id number] Date of Birth: 12/25/1950 Referring Provider:   Flowsheet Row Pulmonary Rehab from 08/25/2023 in [facility] Cardiac and Pulmonary Rehab  Referring Provider Dr. Luciano Cutter       Encounter Date: 10/05/2023  Check In:  Session Check In - 10/05/23 1401       Check-In   Supervising physician immediately available to respond to emergencies See telemetry face sheet for immediately available ER MD    Location [facility]-Cardiac & Pulmonary Rehab    Staff Present Maxon Conetta BS, Exercise Physiologist;Noah Tickle, BS, Exercise Physiologist;Enas Winchel Jewel Baize RN,BSN;Joseph Hood RCP,RRT,BSRT    Virtual Visit No    Medication changes reported     No    Fall or balance concerns reported    No    Warm-up and Cool-down Performed on first and last piece of equipment    Resistance Training Performed Yes    VAD Patient? No    PAD/SET Patient? No      Pain Assessment   Currently in Pain? No/denies                Social History   Tobacco Use  Smoking Status Never   Passive exposure: Never (Parent)  Smokeless Tobacco Never    Goals Met:  Independence with exercise equipment Exercise tolerated well No report of concerns or symptoms today Strength training completed today  Goals Unmet:  Not Applicable  Comments: Pt able to follow exercise prescription today without complaint.  Will continue to monitor for progression.    Dr. Bethann Punches is Medical Director for [facility] Cardiac Rehabilitation.  Dr. Vida Rigger is Medical Director for [facility] Pulmonary Rehabilitation.

## 2023-10-07 ENCOUNTER — Encounter: Payer: 59 | Admitting: *Deleted

## 2023-10-07 DIAGNOSIS — R0600 Dyspnea, unspecified: Secondary | ICD-10-CM

## 2023-10-07 DIAGNOSIS — J8410 Pulmonary fibrosis, unspecified: Secondary | ICD-10-CM | POA: Diagnosis not present

## 2023-10-07 NOTE — Progress Notes (Signed)
Daily Session Note  Patient Details  Name: Faith Padilla MRN: [id number] Date of Birth: 09/13/1950 Referring Provider:   Flowsheet Row Pulmonary Rehab from 08/25/2023 in [facility] Cardiac and Pulmonary Rehab  Referring Provider Dr. Luciano Cutter       Encounter Date: 10/07/2023  Check In:  Session Check In - 10/07/23 1355       Check-In   Supervising physician immediately available to respond to emergencies See telemetry face sheet for immediately available ER MD    Location [facility]-Cardiac & Pulmonary Rehab    Staff Present Maxon Conetta BS, Exercise Physiologist;Kelly Cloretta Ned, ACSM CEP, Exercise Physiologist;Noah Tickle, BS, Exercise Physiologist;Diani Jillson Jewel Baize RN,BSN    Virtual Visit No    Medication changes reported     No    Fall or balance concerns reported    No    Warm-up and Cool-down Performed on first and last piece of equipment    Resistance Training Performed Yes    VAD Patient? No    PAD/SET Patient? No      Pain Assessment   Currently in Pain? No/denies                Social History   Tobacco Use  Smoking Status Never   Passive exposure: Never (Parent)  Smokeless Tobacco Never    Goals Met:  Independence with exercise equipment Exercise tolerated well No report of concerns or symptoms today Strength training completed today  Goals Unmet:  Not Applicable  Comments: Pt able to follow exercise prescription today without complaint.  Will continue to monitor for progression.    Dr. Bethann Punches is Medical Director for [facility] Cardiac Rehabilitation.  Dr. Vida Rigger is Medical Director for [facility] Pulmonary Rehabilitation.

## 2023-10-08 ENCOUNTER — Encounter: Payer: 59 | Admitting: *Deleted

## 2023-10-08 DIAGNOSIS — J8410 Pulmonary fibrosis, unspecified: Secondary | ICD-10-CM | POA: Diagnosis not present

## 2023-10-08 NOTE — Progress Notes (Signed)
Daily Session Note  Patient Details  Name: Faith Padilla MRN: [id number] Date of Birth: [birth year] Referring Provider:   Flowsheet Row Pulmonary Rehab from 08/25/2023 in [facility] Cardiac and Pulmonary Rehab  Referring Provider Dr. Luciano Cutter       Encounter Date: 10/08/2023  Check In:  Session Check In - 10/08/23 1351       Check-In   Supervising physician immediately available to respond to emergencies See telemetry face sheet for immediately available ER MD    Location [facility]-Cardiac & Pulmonary Rehab    Staff Present Maxon Conetta BS, Exercise Physiologist;Noah Tickle, BS, Exercise Physiologist;Gyasi Hazzard Jewel Baize RN,BSN;Joseph Hood RCP,RRT,BSRT    Virtual Visit No    Medication changes reported     No    Fall or balance concerns reported    No    Warm-up and Cool-down Performed on first and last piece of equipment    Resistance Training Performed Yes    VAD Patient? No    PAD/SET Patient? No      Pain Assessment   Currently in Pain? No/denies                Social History   Tobacco Use  Smoking Status Never   Passive exposure: Never (Parent)  Smokeless Tobacco Never    Goals Met:  Independence with exercise equipment Exercise tolerated well No report of concerns or symptoms today Strength training completed today  Goals Unmet:  Not Applicable  Comments: Pt able to follow exercise prescription today without complaint.  Will continue to monitor for progression.    Dr. Bethann Punches is Medical Director for [facility] Cardiac Rehabilitation.  Dr. Vida Rigger is Medical Director for [facility] Pulmonary Rehabilitation.

## 2023-10-12 ENCOUNTER — Encounter: Payer: 59 | Attending: Pulmonary Disease | Admitting: *Deleted

## 2023-10-12 ENCOUNTER — Ambulatory Visit: Payer: 59 | Admitting: Urology

## 2023-10-12 ENCOUNTER — Encounter: Payer: Self-pay | Admitting: Urology

## 2023-10-12 VITALS — BP 134/81 | HR 99 | Ht 61.0 in

## 2023-10-12 DIAGNOSIS — Z87440 Personal history of urinary (tract) infections: Secondary | ICD-10-CM | POA: Diagnosis not present

## 2023-10-12 DIAGNOSIS — J8410 Pulmonary fibrosis, unspecified: Secondary | ICD-10-CM | POA: Diagnosis present

## 2023-10-12 DIAGNOSIS — R0600 Dyspnea, unspecified: Secondary | ICD-10-CM | POA: Diagnosis present

## 2023-10-12 DIAGNOSIS — N3941 Urge incontinence: Secondary | ICD-10-CM

## 2023-10-12 DIAGNOSIS — N3020 Other chronic cystitis without hematuria: Secondary | ICD-10-CM

## 2023-10-12 LAB — URINALYSIS, COMPLETE
Bilirubin, UA: NEGATIVE
Glucose, UA: NEGATIVE
Ketones, UA: NEGATIVE
Nitrite, UA: NEGATIVE
Protein,UA: NEGATIVE
RBC, UA: NEGATIVE
Specific Gravity, UA: 1.025 (ref 1.005–1.030)
Urobilinogen, Ur: 0.2 mg/dL (ref 0.2–1.0)
pH, UA: 5.5 (ref 5.0–7.5)

## 2023-10-12 LAB — MICROSCOPIC EXAMINATION

## 2023-10-12 NOTE — Progress Notes (Signed)
10/12/2023 3:11 PM   Faith Padilla 21-Feb-1951 [id number]  Referring provider: Alyson Reedy, FNP [address] [place],  [place] [address]  No chief complaint on file.   HPI: Patient last saw nurse practitioner 7 years ago was on Vesicare.  Had some prolapse symptoms.  Estrogen cream provided.   Today Patient has urge incontinence but does not leak with coughing sneezing.  No bedwetting.  3 heavier briefs a day that are soaked.  Voids every 1 hour and cannot hold it for 2 hours.  Voids hourly at night.  No ankle edema.  No diuretic   Has had 2 low back operations and hysterectomy   Recently went on Vesicare and had dry mouth and retention symptoms   On further questioning patient was on Myrbetriq for years and she said it did not work.  In the last 6 months she says she has some suprapubic discomfort when she is full and during urination.  It is relieved when she voids     Patient has an overactive bladder with urge incontinence.  She has frequency and significant nocturia.  Reassess in 6 weeks on Gemtesa samples and prescription for pelvic examination and cystoscopy.  Call if culture positive.  She understands that a bladder infection would explain the discomfort but otherwise I will be performing cystoscopy for incontinence and discomfort with urination    Today Frequency stable.  Last culture positive. The patient said when I treated the bladder infection her frequency got a lot at her but she was still leaking some.  She said the Ottertail did not help her.  She says sometimes urine can be cloudy or foul-smelling but does not give a strong history of recurrent UTIs   On pelvic examination she had a well supported bladder neck and no prolapse or stress incontinence with a moderate cough after cystoscopy Cystoscopy: Patient underwent flexible cystoscopy.  Urine was cloudy with a lot of white flecks.  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  No carcinoma.    I would like to send in ciprofloxacin 250 mg twice a day for 7 days and have her come back in 6 to 8 weeks on daily trimethoprim. I am hoping this will down regulate some of her incontinence. It may explain why some of the medications have not helped her. Another trial of an antimuscarinic may be prudent. Third line therapies are options. She has not had urodynamics   Today Frequency stable last culture demonstrated gram-positive organisms. Patient says she is infection free.  She says she is 80% better in terms of her urge incontinence.    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   Diverticulitis    Dry eye syndrome of both eyes 08/13/2021   Dyspnea on exertion 08/13/2021   Family history of problems related to stress 11/15/2021  Frequency of urination and polyuria 06/25/2022   GAD (generalized anxiety disorder) 08/15/2021   Glaucoma    H/O urinary incontinence 06/25/2022   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   High risk medication use 08/02/2018   HLD (hyperlipidemia)    Iron deficiency 12/04/2019   Isolation (social) 12/07/2021   Kidney stones    Microscopic hematuria    Mixed incontinence    Morbid obesity (HCC)    Ocular hypertension, 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   Ocular migraine 08/02/2018   Osteoarthritis    lumbar spine   Osteoarthritis of right knee  08/28/2023   Palpitations 08/13/2021   Plantar fasciitis 06/25/2023   Post covid-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, 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   Postoperative follow-up 03/30/2020   Prediabetes 08/14/2015   Pseudophakia, both eyes 06/26/2016   Rectal bleeding 10/17/2021   Shortness of breath dyspnea    Sleep apnea    CPAP   Vaginal yeast infection    Wears dentures    partial upper    Surgical History: Past Surgical History:  Procedure Laterality Date   ABDOMINAL HYSTERECTOMY  09/08/1996   BACK SURGERY  1990/2014   CATARACT EXTRACTION, BILATERAL  09/08/2012   COLONOSCOPY WITH PROPOFOL N/A 03/25/2016   Procedure: COLONOSCOPY WITH PROPOFOL;  Surgeon: Midge Minium, MD;  Location: [facility] ENDOSCOPY;  Service: Endoscopy;  Laterality: N/A;   deviated nose septum surgery  09/08/1998   ESOPHAGOGASTRODUODENOSCOPY (EGD) WITH PROPOFOL N/A 03/25/2016   Procedure: ESOPHAGOGASTRODUODENOSCOPY (EGD) WITH PROPOFOL;  Surgeon: Midge Minium, MD;  Location: [facility] ENDOSCOPY;  Service: Endoscopy;  Laterality: N/A;   EYE SURGERY     LITHOTRIPSY  09/08/2010   TOTAL HIP ARTHROPLASTY Left     Home Medications:  Allergies as of 10/12/2023       Reactions   Morphine Hives        Medication List        Accurate as of October 12, 2023  3:11 PM. If you have any questions, ask your nurse or doctor.          albuterol 108 (90 Base) MCG/ACT inhaler Commonly known as: VENTOLIN HFA Inhale 1-2 puffs into the lungs every 6 (six) hours as needed for wheezing or shortness of breath.   atorvastatin 20 MG tablet Commonly known as: LIPITOR TAKE 1 TABLET(20 MG) BY MOUTH AT BEDTIME   Biotin 1 MG Caps Take 1 capsule by mouth daily.   CALCIUM PO Take by mouth.   cetirizine 10 MG tablet Commonly known as: ZYRTEC Take 1 tablet (10 mg total) by mouth at bedtime.   DULoxetine 60 MG capsule Commonly known as: CYMBALTA Take 1 capsule (60 mg total) by mouth daily. Reported on 12/26/2015   fluticasone 50 MCG/ACT nasal  spray Commonly known as: FLONASE Place 2 sprays into both nostrils daily.   Gabapentin (Once-Daily) 300 MG Tabs Take 0.5 tablets (150 mg total) by mouth at bedtime.   hydrOXYzine 50 MG tablet Commonly known as: ATARAX Take by mouth.   levothyroxine 75 MCG tablet Commonly known as: SYNTHROID Take 1 tablet (75 mcg total) by mouth daily before breakfast.   montelukast 10 MG tablet Commonly known as: SINGULAIR Take 1 tablet (10 mg total) by mouth at bedtime.   nystatin powder Commonly known as: MYCOSTATIN/NYSTOP Apply 1 Application topically 3 (three) times daily.   omeprazole 20 MG capsule Commonly known as: PRILOSEC TAKE 1 CAPSULE(20 MG) BY MOUTH TWICE DAILY BEFORE A MEAL   PROBIOTIC PO Take by mouth.   topiramate 50 MG tablet Commonly known as: TOPAMAX Take 1 tablet (  50 mg total) by mouth daily.   trimethoprim 100 MG tablet Commonly known as: TRIMPEX Take 1 tablet (100 mg total) by mouth daily.        Allergies:  Allergies  Allergen Reactions   Morphine Hives    Family History: Family History  Problem Relation Age of Onset   Depression Mother    Asthma Mother    Heart disease Mother    Diabetes Mother    Kidney disease Mother    Cancer Father        lung cancer   COPD Father    Liver cancer Father    Cancer Sister        Brain Cancer   Allergic rhinitis Sister    Asthma Sister    Depression Daughter    Hematuria Daughter    Bladder Cancer Neg Hx     Social History:  reports that she has never smoked. She has never been exposed to tobacco smoke. She has never used smokeless tobacco. She reports that she does not drink alcohol and does not use drugs.  ROS:                                        Physical Exam: There were no vitals taken for this visit.  Constitutional:  Alert and oriented, No acute distress. HEENT: [place] AT, moist mucus membranes.  Trachea midline, no masses.   Laboratory Data: Lab Results  Component  Value Date   WBC 6.1 07/20/2023   HGB 12.8 07/20/2023   HCT 40.0 07/20/2023   MCV 91 07/20/2023   PLT 364 07/20/2023    Lab Results  Component Value Date   CREATININE 1.00 07/20/2023    No results found for: "PSA"  No results found for: "TESTOSTERONE"  Lab Results  Component Value Date   HGBA1C 5.9 (H) 05/07/2023    Urinalysis    Component Value Date/Time   COLORURINE YELLOW (A) 03/04/2016 1920   APPEARANCEUR Hazy (A) 07/27/2023 1457   LABSPEC 1.014 03/04/2016 1920   LABSPEC 1.014 07/24/2014 1519   PHURINE 7.0 03/04/2016 1920   GLUCOSEU Negative 07/27/2023 1457   GLUCOSEU Negative 07/24/2014 1519   HGBUR NEGATIVE 03/04/2016 1920   BILIRUBINUR Negative 07/27/2023 1457   BILIRUBINUR Negative 07/24/2014 1519   KETONESUR NEGATIVE 03/04/2016 1920   PROTEINUR Negative 07/27/2023 1457   PROTEINUR NEGATIVE 03/04/2016 1920   UROBILINOGEN negative 12/06/2015 1448   NITRITE Negative 07/27/2023 1457   NITRITE NEGATIVE 03/04/2016 1920   LEUKOCYTESUR 1+ (A) 07/27/2023 1457   LEUKOCYTESUR 2+ 07/24/2014 1519    Pertinent Imaging: Urine positive and sent for culture  Assessment & Plan: Reassess durability in 4 months.  Call if culture positive.  She has dramatically down regulated.  She understands that medication in the past may not of help because of her infections.  There are no diagnoses linked to this encounter.  No follow-ups on file.  Martina Sinner, MD  [facility] [address] [place], [place] [address] [phone]

## 2023-10-12 NOTE — Progress Notes (Signed)
Daily Session Note  Patient Details  Name: Faith Padilla MRN: [id number] Date of Birth: 10-Mar-1951 Referring Provider:   Flowsheet Row Pulmonary Rehab from 08/25/2023 in [facility] Cardiac and Pulmonary Rehab  Referring Provider Dr. Luciano Cutter       Encounter Date: 10/12/2023  Check In:  Session Check In - 10/12/23 1352       Check-In   Supervising physician immediately available to respond to emergencies See telemetry face sheet for immediately available ER MD    Location [facility]-Cardiac & Pulmonary Rehab    Staff Present Maxon Conetta BS, Exercise Physiologist;Noah Tickle, BS, Exercise Physiologist;Aaron Bostwick Jewel Baize RN,BSN;Joseph Hood RCP,RRT,BSRT    Virtual Visit No    Medication changes reported     No    Fall or balance concerns reported    No    Warm-up and Cool-down Performed on first and last piece of equipment    Resistance Training Performed Yes    VAD Patient? No    PAD/SET Patient? No      Pain Assessment   Currently in Pain? No/denies                Social History   Tobacco Use  Smoking Status Never   Passive exposure: Never (Parent)  Smokeless Tobacco Never    Goals Met:  Independence with exercise equipment Exercise tolerated well No report of concerns or symptoms today Strength training completed today  Goals Unmet:  Not Applicable  Comments: Pt able to follow exercise prescription today without complaint.  Will continue to monitor for progression.    Dr. Bethann Punches is Medical Director for [facility] Cardiac Rehabilitation.  Dr. Vida Rigger is Medical Director for [facility] Pulmonary Rehabilitation.

## 2023-10-14 ENCOUNTER — Encounter: Payer: 59 | Admitting: *Deleted

## 2023-10-14 DIAGNOSIS — J8410 Pulmonary fibrosis, unspecified: Secondary | ICD-10-CM

## 2023-10-14 NOTE — Progress Notes (Signed)
Daily Session Note  Patient Details  Name: Faith Padilla MRN: [id number] Date of Birth: August 31, 1951 Referring Provider:   Flowsheet Row Pulmonary Rehab from 08/25/2023 in [facility] Cardiac and Pulmonary Rehab  Referring Provider Dr. Luciano Cutter       Encounter Date: 10/14/2023  Check In:  Session Check In - 10/14/23 1356       Check-In   Supervising physician immediately available to respond to emergencies See telemetry face sheet for immediately available ER MD    Location [facility]-Cardiac & Pulmonary Rehab    Staff Present Maxon Conetta BS, Exercise Physiologist;Kelly Cloretta Ned, ACSM CEP, Exercise Physiologist;Noah Tickle, BS, Exercise Physiologist;Joseph Reino Kent RCP,RRT,BSRT    Virtual Visit No    Medication changes reported     No    Fall or balance concerns reported    No    Warm-up and Cool-down Performed on first and last piece of equipment    Resistance Training Performed Yes    VAD Patient? No    PAD/SET Patient? No      Pain Assessment   Currently in Pain? No/denies                Social History   Tobacco Use  Smoking Status Never   Passive exposure: Never (Parent)  Smokeless Tobacco Never    Goals Met:  Independence with exercise equipment Exercise tolerated well No report of concerns or symptoms today Strength training completed today  Goals Unmet:  Not Applicable  Comments: Pt able to follow exercise prescription today without complaint.  Will continue to monitor for progression.    Reviewed home exercise with pt today from 1:55 pm-2:06 pm.  Pt plans to walk and use hand weights for exercise.  Reviewed THR, pulse, RPE, sign and symptoms, pulse oximetery and when to call 911 or MD.  Also discussed weather considerations and indoor options.  Pt voiced understanding.     Dr. Bethann Punches is Medical Director for [facility] Card[facility] Rehabilitation.  Dr. Vida Rigger is Medical Director for [facility] Pulmonary Rehabilitation.

## 2023-10-15 ENCOUNTER — Ambulatory Visit (HOSPITAL_BASED_OUTPATIENT_CLINIC_OR_DEPARTMENT_OTHER): Payer: 59 | Admitting: Pulmonary Disease

## 2023-10-15 ENCOUNTER — Ambulatory Visit: Payer: 59

## 2023-10-15 LAB — CULTURE, URINE COMPREHENSIVE

## 2023-10-16 ENCOUNTER — Ambulatory Visit
Admission: RE | Admit: 2023-10-16 | Discharge: 2023-10-16 | Disposition: A | Discharge status: Home / Self Care | Payer: 59 | Attending: Family Medicine | Admitting: Family Medicine

## 2023-10-16 ENCOUNTER — Ambulatory Visit
Admission: RE | Admit: 2023-10-16 | Discharge: 2023-10-16 | Disposition: A | Discharge status: Home / Self Care | Payer: 59 | Source: Ambulatory Visit | Attending: Family Medicine | Admitting: Family Medicine

## 2023-10-16 ENCOUNTER — Encounter: Payer: Self-pay | Admitting: Family Medicine

## 2023-10-16 ENCOUNTER — Ambulatory Visit: Payer: 59 | Admitting: Family Medicine

## 2023-10-16 VITALS — BP 130/79 | HR 85 | Ht 61.0 in | Wt 238.0 lb

## 2023-10-16 DIAGNOSIS — R051 Acute cough: Secondary | ICD-10-CM

## 2023-10-16 DIAGNOSIS — J209 Acute bronchitis, unspecified: Secondary | ICD-10-CM | POA: Diagnosis not present

## 2023-10-16 DIAGNOSIS — D508 Other iron deficiency anemias: Secondary | ICD-10-CM

## 2023-10-16 DIAGNOSIS — E039 Hypothyroidism, unspecified: Secondary | ICD-10-CM

## 2023-10-16 DIAGNOSIS — J8410 Pulmonary fibrosis, unspecified: Secondary | ICD-10-CM

## 2023-10-16 DIAGNOSIS — R7989 Other specified abnormal findings of blood chemistry: Secondary | ICD-10-CM

## 2023-10-16 MED ORDER — IPRATROPIUM-ALBUTEROL 0.5-2.5 (3) MG/3ML IN SOLN: 3.0000 mL | RESPIRATORY_TRACT | 3 refills | Status: AC | PRN

## 2023-10-16 MED ORDER — AZITHROMYCIN 250 MG PO TABS: ORAL_TABLET | ORAL | 0 refills | Status: AC

## 2023-10-16 MED ORDER — BUDESONIDE-FORMOTEROL FUMARATE 160-4.5 MCG/ACT IN AERO: 2.0000 | INHALATION_SPRAY | Freq: Two times a day (BID) | RESPIRATORY_TRACT | 3 refills | Status: AC

## 2023-10-16 MED ORDER — PREDNISONE 20 MG PO TABS: ORAL_TABLET | ORAL | 0 refills | Status: DC

## 2023-10-16 NOTE — Assessment & Plan Note (Signed)
 Hypothyroidism with recent symptoms of dry hands and thinning hair. Previous levothyroxine  increase by another provider. Only one thyroid  level on record, which was normal. Discussed rechecking thyroid  levels to assess current status. - Order thyroid  level test

## 2023-10-16 NOTE — Assessment & Plan Note (Signed)
 Anemia with last infusion two years ago. Recent blood panel shows lower side of normal. Discussed referral to hematology for further evaluation and potential need for another infusion. - Order iron panel - Refer to hematology

## 2023-10-16 NOTE — Progress Notes (Signed)
 Established patient visit   Patient: Faith Padilla   DOB: [birth year]   73 y.o. Female  MRN: [id number] Visit Date: 10/16/2023  Today's healthcare provider: LAURAINE LOISE BUOY, DO   Chief Complaint  Patient presents with   Cough    Started this week got worse over the past couple days Nose has been running since allergy  test 3 months ago Started to cough up flem this morning. Pt stated it was yellow looking  Patients wants to talk about thyroid  medicine   Subjective    HPI Faith Padilla is a 73 year old female with pulmonary fibrosis who presents with a cough and respiratory symptoms.   She has been experiencing a cough for the past week, which is unusual for her. This morning, she produced yellow phlegm with her cough. There is no associated pain or other symptoms. She was exposed to her sick grandson three days before he was taken to urgent care, although he was not coughing at the time.  She has a history of pulmonary fibrosis and is concerned about her respiratory symptoms. She used her albuterol  inhaler yesterday and today, but it does not fit into her spacer. She only has one inhaler, which is the albuterol  rescue inhaler. She recalls using Symbicort  in the past, and it was effective for her. She has not noticed any tachycardia when using nebulizers.  She has had a runny nose for three months, which she attributes to an allergy  test done previously. No ear pain, throat pain, facial pain, or fever.  She discusses her thyroid  medication, noting that her levothyroxine  was increased by a previous provider a couple of years ago. She has been experiencing dry hands and thinning hair, which she finds concerning. She also reports memory issues and difficulty having conversations since this change.  She is prone to anemia and is interested in seeing a hematologist to determine if she needs another infusion, as it has been about two years since her last one. Her iron levels were checked  in November and were on the lower side of normal.  She has oxygen at home but does not use it regularly, even though she attends therapy three times a week where her oxygen levels are monitored. Her daughter suggests that not using oxygen at home might be contributing to her illness.     Medications: Outpatient Medications Prior to Visit  Medication Sig   albuterol  (VENTOLIN  HFA) 108 (90 Base) MCG/ACT inhaler Inhale 1-2 puffs into the lungs every 6 (six) hours as needed for wheezing or shortness of breath.   atorvastatin  (LIPITOR) 20 MG tablet TAKE 1 TABLET(20 MG) BY MOUTH AT BEDTIME   Biotin 1 MG CAPS Take 1 capsule by mouth daily.   CALCIUM  PO Take by mouth.   cetirizine  (ZYRTEC ) 10 MG tablet Take 1 tablet (10 mg total) by mouth at bedtime.   DULoxetine  (CYMBALTA ) 60 MG capsule Take 1 capsule (60 mg total) by mouth daily. Reported on 12/26/2015   fluticasone  (FLONASE ) 50 MCG/ACT nasal spray Place 2 sprays into both nostrils daily.   Gabapentin , Once-Daily, 300 MG TABS Take 0.5 tablets (150 mg total) by mouth at bedtime.   hydrOXYzine  (ATARAX ) 50 MG tablet Take by mouth.   levothyroxine  (SYNTHROID ) 75 MCG tablet Take 1 tablet (75 mcg total) by mouth daily before breakfast.   montelukast  (SINGULAIR ) 10 MG tablet Take 1 tablet (10 mg total) by mouth at bedtime.   nystatin  (MYCOSTATIN /NYSTOP ) powder Apply 1 Application topically 3 (three)  times daily.   omeprazole  (PRILOSEC) 20 MG capsule TAKE 1 CAPSULE(20 MG) BY MOUTH TWICE DAILY BEFORE A MEAL   Probiotic Product (PROBIOTIC PO) Take by mouth.   topiramate  (TOPAMAX ) 50 MG tablet Take 1 tablet (50 mg total) by mouth daily.   trimethoprim  (TRIMPEX ) 100 MG tablet Take 1 tablet (100 mg total) by mouth daily.   No facility-administered medications prior to visit.    Review of Systems  Constitutional:  Negative for appetite change, chills, fatigue and fever.  HENT:  Positive for congestion and rhinorrhea. Negative for ear pain, sinus pressure,  sinus pain and sore throat.   Respiratory:  Positive for cough (productive). Negative for chest tightness and shortness of breath.   Cardiovascular:  Negative for chest pain and palpitations.  Gastrointestinal:  Negative for abdominal pain, nausea and vomiting.  Neurological:  Negative for dizziness and weakness.        Objective    BP 130/79   Pulse 85   Ht 5' 1 (1.549 m)   Wt 238 lb (108 kg)   SpO2 97%   BMI 44.97 kg/m     Physical Exam Constitutional:      Appearance: Normal appearance.  HENT:     Head: Normocephalic and atraumatic.  Eyes:     General: No scleral icterus.    Extraocular Movements: Extraocular movements intact.     Conjunctiva/sclera: Conjunctivae normal.  Cardiovascular:     Rate and Rhythm: Normal rate and regular rhythm.     Pulses: Normal pulses.     Heart sounds: Normal heart sounds.  Pulmonary:     Effort: Pulmonary effort is normal. No respiratory distress.     Breath sounds: Rhonchi (mild at lung bases) present.  Abdominal:     General: Bowel sounds are normal. There is no distension.     Palpations: Abdomen is soft. There is no mass.     Tenderness: There is no abdominal tenderness. There is no guarding.  Musculoskeletal:     Right lower leg: No edema.     Left lower leg: No edema.  Skin:    General: Skin is warm and dry.  Neurological:     Mental Status: She is alert and oriented to person, place, and time. Mental status is at baseline.  Psychiatric:        Mood and Affect: Mood normal.        Behavior: Behavior normal.      No results found for any visits on 10/16/23.  Assessment & Plan    Acute cough -     Azithromycin ; Take 2 tablets on day 1, then 1 tablet daily on days 2 through 5  Dispense: 6 tablet; Refill: 0 -     DG Chest 2 View; Future  Acute bronchitis, unspecified organism Assessment & Plan: Acute bronchitis with productive cough and yellow sputum. No fever, ear pain, throat pain, or facial pain. Coarse breath  sounds at the lung bases. Differential includes pneumonia. Discussed deep breathing exercises to prevent pneumonia and aid recovery. - Order chest x-ray - Prescribe azithromycin  (Z-Pak) - Prescribe prednisone  - Follow-up in one week  Orders: -     Azithromycin ; Take 2 tablets on day 1, then 1 tablet daily on days 2 through 5  Dispense: 6 tablet; Refill: 0 -     predniSONE ; Take 60mg  PO daily x 2 days, then40mg  PO daily x 2 days, then 20mg  PO daily x 3 days  Dispense: 13 tablet; Refill: 0 -  DG Chest 2 View; Future  Elevated vitamin B12 level Assessment & Plan: High vitamin B12 levels led to discontinuation of supplementation. Symptoms of memory issues and difficulty in conversation. Discussed rechecking B12 levels and potential for restarting supplementation at a lower dose if levels are stable. - Order B12 level test  Orders: -     Vitamin B12  Iron deficiency anemia secondary to inadequate dietary iron intake Assessment & Plan: Anemia with last infusion two years ago. Recent blood panel shows lower side of normal. Discussed referral to hematology for further evaluation and potential need for another infusion. - Order iron panel - Refer to hematology  Orders: -     Ambulatory referral to Hematology / Oncology -     Iron, TIBC and Ferritin Panel -     CBC with Differential/Platelet  Hypothyroidism, unspecified type Assessment & Plan: Hypothyroidism with recent symptoms of dry hands and thinning hair. Previous levothyroxine  increase by another provider. Only one thyroid  level on record, which was normal. Discussed rechecking thyroid  levels to assess current status. - Order thyroid  level test  Orders: -     TSH + free T4  Pulmonary fibrosis (HCC) Assessment & Plan: Pulmonary fibrosis with recent exacerbation. Uses albuterol  inhaler but it does not fit the spacer. No recent use of Symbicort . Discussed benefits of Symbicort  for daily symptom control and reducing rescue inhaler  use. Encouraged consistent use of home oxygen to improve lifespan and reduce body strain. - Prescribe Symbicort  inhaler - Prescribe nebulizer solution (Duoneb) - Encourage use of oxygen at home - Encourage follow up with pulmonology - Continue pulmonary rehab  Orders: -     Ipratropium-Albuterol ; Take 3 mLs by nebulization every 4 (four) hours as needed.  Dispense: 360 mL; Refill: 3 -     Budesonide -Formoterol  Fumarate; Inhale 2 puffs into the lungs 2 (two) times daily.  Dispense: 1 each; Refill: 3   General Health Maintenance Routine health maintenance discussed. Follow-up appointment for physical in one week to review overall health and current treatment outcomes. - Keep follow-up appointment for physical in one week.   Return if symptoms worsen or fail to improve.      I discussed the assessment and treatment plan with the patient  The patient was provided an opportunity to ask questions and all were answered. The patient agreed with the plan and demonstrated an understanding of the instructions.   The patient was advised to call back or seek an in-person evaluation if the symptoms worsen or if the condition fails to improve as anticipated.    LAURAINE LOISE BUOY, DO  [facility] [phone] (phone) [phone] (fax)  [facility]

## 2023-10-16 NOTE — Assessment & Plan Note (Signed)
 Pulmonary fibrosis with recent exacerbation. Uses albuterol  inhaler but it does not fit the spacer. No recent use of Symbicort . Discussed benefits of Symbicort  for daily symptom control and reducing rescue inhaler use. Encouraged consistent use of home oxygen to improve lifespan and reduce body strain. - Prescribe Symbicort  inhaler - Prescribe nebulizer solution (Duoneb) - Encourage use of oxygen at home - Encourage follow up with pulmonology - Continue pulmonary rehab

## 2023-10-16 NOTE — Assessment & Plan Note (Signed)
 Acute bronchitis with productive cough and yellow sputum. No fever, ear pain, throat pain, or facial pain. Coarse breath sounds at the lung bases. Differential includes pneumonia. Discussed deep breathing exercises to prevent pneumonia and aid recovery. - Order chest x-ray - Prescribe azithromycin  (Z-Pak) - Prescribe prednisone  - Follow-up in one week

## 2023-10-16 NOTE — Assessment & Plan Note (Signed)
 High vitamin B12 levels led to discontinuation of supplementation. Symptoms of memory issues and difficulty in conversation. Discussed rechecking B12 levels and potential for restarting supplementation at a lower dose if levels are stable. - Order B12 level test

## 2023-10-17 LAB — CBC WITH DIFFERENTIAL/PLATELET
Basophils Absolute: 0.1 10*3/uL (ref 0.0–0.2)
Basos: 1 %
EOS (ABSOLUTE): 0.4 10*3/uL (ref 0.0–0.4)
Eos: 3 %
Hematocrit: 46.4 % (ref 34.0–46.6)
Hemoglobin: 15 g/dL (ref 11.1–15.9)
Immature Grans (Abs): 0 10*3/uL (ref 0.0–0.1)
Immature Granulocytes: 0 %
Lymphocytes Absolute: 2.8 10*3/uL (ref 0.7–3.1)
Lymphs: 25 %
MCH: 30.1 pg (ref 26.6–33.0)
MCHC: 32.3 g/dL (ref 31.5–35.7)
MCV: 93 fL (ref 79–97)
Monocytes Absolute: 1.1 10*3/uL — ABNORMAL HIGH (ref 0.1–0.9)
Monocytes: 10 %
Neutrophils Absolute: 6.9 10*3/uL (ref 1.4–7.0)
Neutrophils: 61 %
Platelets: 429 10*3/uL (ref 150–450)
RBC: 4.98 x10E6/uL (ref 3.77–5.28)
RDW: 14.5 % (ref 11.7–15.4)
WBC: 11.3 10*3/uL — ABNORMAL HIGH (ref 3.4–10.8)

## 2023-10-17 LAB — IRON,TIBC AND FERRITIN PANEL
Ferritin: 81 ng/mL (ref 15–150)
Iron Saturation: 20 % (ref 15–55)
Iron: 70 ug/dL (ref 27–139)
Total Iron Binding Capacity: 343 ug/dL (ref 250–450)
UIBC: 273 ug/dL (ref 118–369)

## 2023-10-17 LAB — TSH+FREE T4
Free T4: 1.11 ng/dL (ref 0.82–1.77)
TSH: 1.08 u[IU]/mL (ref 0.450–4.500)

## 2023-10-17 LAB — VITAMIN B12: Vitamin B-12: 1114 pg/mL (ref 232–1245)

## 2023-10-19 ENCOUNTER — Telehealth: Payer: Self-pay | Admitting: *Deleted

## 2023-10-19 NOTE — Telephone Encounter (Signed)
 Patient called and will be out all week.  She went to the Dr and she has bronchitis

## 2023-10-20 ENCOUNTER — Ambulatory Visit: Payer: 59 | Admitting: Internal Medicine

## 2023-10-20 ENCOUNTER — Telehealth: Payer: 59

## 2023-10-20 ENCOUNTER — Ambulatory Visit: Payer: Self-pay

## 2023-10-20 DIAGNOSIS — J3489 Other specified disorders of nose and nasal sinuses: Secondary | ICD-10-CM

## 2023-10-20 MED ORDER — IPRATROPIUM BROMIDE 0.03 % NA SOLN: 2.0000 | Freq: Two times a day (BID) | NASAL | 0 refills | Status: DC

## 2023-10-20 NOTE — Addendum Note (Signed)
Addended by: Jacquenette Shone on: 10/20/2023 04:34 PM   Modules accepted: Orders

## 2023-10-20 NOTE — Telephone Encounter (Signed)
    Chief Complaint: Seen 10/16/23 with URI. Having dizziness and sore throat. Has nasal drainage, using Flonase. Declines OV. "I have an appointment this Friday." Asking for "something to take for this nasal drainage." Symptoms: Above Frequency: Today Pertinent Negatives: Patient denies fever Disposition: [] ED /[] Urgent Care (no appt availability in office) / [] Appointment(In office/virtual)/ []  [facility]/ [] Home Care/ [x] Refused Recommended Disposition /[] [facility]/ [x]  Follow-up with PCP Additional Notes: Please advise pt.  Reason for Disposition  [1] MODERATE dizziness (e.g., interferes with normal activities) AND [2] has NOT been evaluated by doctor (or NP/PA) for this  (Exception: Dizziness caused by heat exposure, sudden standing, or poor fluid intake.)  Answer Assessment - Initial Assessment Questions 1. DESCRIPTION: "Describe your dizziness."     Lightheaded 2. LIGHTHEADED: "Do you feel lightheaded?" (e.g., somewhat faint, woozy, weak upon standing)     Yes 3. VERTIGO: "Do you feel like either you or the room is spinning or tilting?" (i.e. vertigo)     No 4. SEVERITY: "How bad is it?"  "Do you feel like you are going to faint?" "Can you stand and walk?"   - MILD: Feels slightly dizzy, but walking normally.   - MODERATE: Feels unsteady when walking, but not falling; interferes with normal activities (e.g., school, work).   - SEVERE: Unable to walk without falling, or requires assistance to walk without falling; feels like passing out now.      Mild  5. ONSET:  "When did the dizziness begin?"     Today 6. AGGRAVATING FACTORS: "Does anything make it worse?" (e.g., standing, change in head position)     Walking 7. HEART RATE: "Can you tell me your heart rate?" "How many beats in 15 seconds?"  (Note: not all patients can do this)       No 8. CAUSE: "What do you think is causing the dizziness?"     Unsure 9. RECURRENT SYMPTOM: "Have you had dizziness  before?" If Yes, ask: "When was the last time?" "What happened that time?"     Yes 10. OTHER SYMPTOMS: "Do you have any other symptoms?" (e.g., fever, chest pain, vomiting, diarrhea, bleeding)       Sore throat 11. PREGNANCY: "Is there any chance you are pregnant?" "When was your last menstrual period?"       No  Protocols used: Dizziness - Lightheadedness-A-AH

## 2023-10-21 ENCOUNTER — Inpatient Hospital Stay: Payer: 59 | Attending: Oncology | Admitting: Oncology

## 2023-10-21 ENCOUNTER — Encounter: Payer: 59 | Admitting: Family Medicine

## 2023-10-21 ENCOUNTER — Encounter: Payer: Self-pay | Admitting: Oncology

## 2023-10-21 ENCOUNTER — Encounter: Payer: Self-pay | Admitting: *Deleted

## 2023-10-21 ENCOUNTER — Inpatient Hospital Stay: Payer: 59

## 2023-10-21 VITALS — BP 123/63 | HR 82 | Temp 98.0°F | Resp 18 | Ht 61.0 in | Wt 243.6 lb

## 2023-10-21 DIAGNOSIS — Z801 Family history of malignant neoplasm of trachea, bronchus and lung: Secondary | ICD-10-CM | POA: Diagnosis not present

## 2023-10-21 DIAGNOSIS — R0600 Dyspnea, unspecified: Secondary | ICD-10-CM

## 2023-10-21 DIAGNOSIS — Z800 Family history of malignant neoplasm of digestive organs: Secondary | ICD-10-CM | POA: Diagnosis not present

## 2023-10-21 DIAGNOSIS — D509 Iron deficiency anemia, unspecified: Secondary | ICD-10-CM | POA: Insufficient documentation

## 2023-10-21 DIAGNOSIS — Z808 Family history of malignant neoplasm of other organs or systems: Secondary | ICD-10-CM | POA: Diagnosis not present

## 2023-10-21 DIAGNOSIS — J8410 Pulmonary fibrosis, unspecified: Secondary | ICD-10-CM

## 2023-10-21 NOTE — Progress Notes (Signed)
 Pulmonary Individual Treatment Plan  Patient Details  Name: Faith Padilla MRN: [id number] Date of Birth: Oct 26, 1950 Referring Provider:   Flowsheet Row Pulmonary Rehab from 08/25/2023 in [facility] Cardiac [facility] Pulmonary Rehab  Referring Provider Dr. Luciano Cutter       Initial Encounter Date:  Flowsheet Row Pulmonary Rehab from 08/25/2023 in [facility] Cardiac and Pulmonary Rehab  Date 08/25/23       Visit Diagnosis: Pulmonary fibrosis (HCC)  Dyspnea, unspecified type  Patient's Home Medications on Admission:  Current Outpatient Medications:    albuterol (VENTOLIN HFA) 108 (90 Base) MCG/ACT inhaler, Inhale 1-2 puffs into the lungs every 6 (six) hours as needed for wheezing or shortness of breath., Disp: 18 g, Rfl: 1   atorvastatin (LIPITOR) 20 MG tablet, TAKE 1 TABLET(20 MG) BY MOUTH AT BEDTIME, Disp: 90 tablet, Rfl: 0   azithromycin (ZITHROMAX) 250 MG tablet, Take 2 tablets on day 1, then 1 tablet daily on days 2 through 5, Disp: 6 tablet, Rfl: 0   Biotin 1 MG CAPS, Take 1 capsule by mouth daily., Disp: , Rfl:    budesonide-formoterol (SYMBICORT) 160-4.5 MCG/ACT inhaler, Inhale 2 puffs into the lungs 2 (two) times daily., Disp: 1 each, Rfl: 3   CALCIUM PO, Take by mouth., Disp: , Rfl:    cetirizine (ZYRTEC) 10 MG tablet, Take 1 tablet (10 mg total) by mouth at bedtime., Disp: 30 tablet, Rfl: 3   DULoxetine (CYMBALTA) 60 MG capsule, Take 1 capsule (60 mg total) by mouth daily. Reported on 12/26/2015, Disp: 90 capsule, Rfl: 3   fluticasone (FLONASE) 50 MCG/ACT nasal spray, Place 2 sprays into both nostrils daily., Disp: 16 g, Rfl: 5   Gabapentin, Once-Daily, 300 MG TABS, Take 0.5 tablets (150 mg total) by mouth at bedtime., Disp: 45 tablet, Rfl: 1   hydrOXYzine (ATARAX) 50 MG tablet, Take by mouth., Disp: , Rfl:    ipratropium (ATROVENT) 0.03 % nasal spray, Place 2 sprays into both nostrils every 12 (twelve) hours. Use in place of flonase (not both together), Disp: 30 mL, Rfl: 0    ipratropium-albuterol (DUONEB) 0.5-2.5 (3) MG/3ML SOLN, Take 3 mLs by nebulization every 4 (four) hours as needed., Disp: 360 mL, Rfl: 3   levothyroxine (SYNTHROID) 75 MCG tablet, Take 1 tablet (75 mcg total) by mouth daily before breakfast., Disp: 90 tablet, Rfl: 3   montelukast (SINGULAIR) 10 MG tablet, Take 1 tablet (10 mg total) by mouth at bedtime., Disp: 30 tablet, Rfl: 5   nystatin (MYCOSTATIN/NYSTOP) powder, Apply 1 Application topically 3 (three) times daily., Disp: 60 g, Rfl: 3   omeprazole (PRILOSEC) 20 MG capsule, TAKE 1 CAPSULE(20 MG) BY MOUTH TWICE DAILY BEFORE A MEAL, Disp: 180 capsule, Rfl: 0   predniSONE (DELTASONE) 20 MG tablet, Take 60mg  PO daily x 2 days, then40mg  PO daily x 2 days, then 20mg  PO daily x 3 days, Disp: 13 tablet, Rfl: 0   Probiotic Product (PROBIOTIC PO), Take by mouth., Disp: , Rfl:    topiramate (TOPAMAX) 50 MG tablet, Take 1 tablet (50 mg total) by mouth daily., Disp: 90 tablet, Rfl: 1   trimethoprim (TRIMPEX) 100 MG tablet, Take 1 tablet (100 mg total) by mouth daily., Disp: 30 tablet, Rfl: 11  Past Medical History: Past Medical History:  Diagnosis Date   Abdominal pain, right upper quadrant    Abdominal pain, RUQ (right upper quadrant) 03/12/2016   Achilles tendinitis of right lower extremity 06/25/2023   Acid reflux    Allergic conjunctivitis of both eyes 06/29/2017  Anemia 12/04/2019   Anxiety and depression    Asthma    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) 08/13/2021   Complication of anesthesia    slow to awaken, wakes coughing and SOB   COPD (chronic obstructive pulmonary disease) (HCC)    Depression    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   Diverticulitis    Dry eye syndrome of both eyes 08/13/2021   Dyspnea on exertion 08/13/2021   Family history of problems related to stress 11/15/2021   Frequency of urination and polyuria 06/25/2022   GAD (generalized anxiety  disorder) 08/15/2021   Glaucoma    H/O urinary incontinence 06/25/2022   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   High risk medication use 08/02/2018   HLD (hyperlipidemia)    Iron deficiency 12/04/2019   Isolation (social) 12/07/2021   Kidney stones    Microscopic hematuria    Mixed incontinence    Morbid obesity (HCC)    Ocular hypertension, 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   Ocular migraine 08/02/2018   Osteoarthritis    lumbar spine   Osteoarthritis of right knee 08/28/2023   Palpitations 08/13/2021   Plantar fasciitis 06/25/2023   Post covid-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, 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   Postoperative follow-up 03/30/2020   Prediabetes 08/14/2015   Pseudophakia, both eyes 06/26/2016   Rectal bleeding 10/17/2021   Shortness of breath dyspnea    Sleep apnea    CPAP   Vaginal yeast infection    Wears dentures    partial upper    Tobacco Use: Social History   Tobacco Use  Smoking Status Never   Passive exposure: Never (Parent)  Smokeless Tobacco Never    Labs: Review Flowsheet       Latest Ref Rng & Units 08/14/2015 08/15/2015 11/20/2015 05/07/2023  Labs for ITP Cardiac and Pulmonary Rehab  Cholestrol 100 - 199 mg/dL - 161  096  045   LDL (calc) 0 - 99 mg/dL - 80  87  81   HDL-C >40 mg/dL - 32  38  43   Trlycerides 0 - 149 mg/dL - 981  191  478   Hemoglobin A1c 4.8 - 5.6 % 5.6  - - 5.9      Pulmonary Assessment Scores:  Pulmonary Assessment Scores     Row Name 08/25/23 1523         ADL UCSD   ADL Phase Entry     SOB Score total 15     Rest 0     Walk 2     Stairs 5     Bath 2     Dress 2     Shop 4       CAT Score   CAT Score 18       mMRC Score   mMRC Score 1              UCSD: Self-administered rating of dyspnea associated with activities of daily living (ADLs) 6-point scale (0 = "not at all" to 5 = "maximal or unable to do because of breathlessness")  Scoring Scores range from 0 to 120.  Minimally important difference is 5 units  CAT: CAT can  identify the health impairment of COPD patients and is better correlated with disease progression.  CAT has a scoring range of zero to 40. The CAT score is classified into four groups of low (less than 10), medium (10 - 20), high (21-30) and very high (31-40) based on the impact level of disease on  health status. A CAT score over 10 suggests significant symptoms.  A worsening CAT score could be explained by an exacerbation, poor medication adherence, poor inhaler technique, or progression of COPD or comorbid conditions.  CAT MCID is 2 points  mMRC: mMRC (Modified Medical Research Council) Dyspnea Scale is used to assess the degree of baseline functional disability in patients of respiratory disease due to dyspnea. No minimal important difference is established. A decrease in score of 1 point or greater is considered a positive change.   Pulmonary Function Assessment:   Exercise Target Goals: Exercise Program Goal: Individual exercise prescription set using results from initial 6 min walk test and THRR while considering  patient's activity barriers and safety.   Exercise Prescription Goal: Initial exercise prescription builds to 30-45 minutes a day of aerobic activity, 2-3 days per week.  Home exercise guidelines will be given to patient during program as part of exercise prescription that the participant will acknowledge.  Education: Aerobic Exercise: - Group verbal and visual presentation on the components of exercise prescription. Introduces F.I.T.T principle from ACSM for exercise prescriptions.  Reviews F.I.T.T. principles of aerobic exercise including progression. Written material given at graduation. Flowsheet Row Pulmonary Rehab from 09/23/2023 in [facility] Cardiac and Pulmonary Rehab  Education need identified 08/25/23       Education: Resistance Exercise: - Group verbal and visual presentation on the components of exercise prescription. Introduces F.I.T.T principle from ACSM for  exercise prescriptions  Reviews F.I.T.T. principles of resistance exercise including progression. Written material given at graduation.    Education: Exercise & Equipment Safety: - Individual verbal instruction and demonstration of equipment use and safety with use of the equipment. Flowsheet Row Pulmonary Rehab from 09/23/2023 in [facility] Cardiac and Pulmonary Rehab  Date 08/25/23  Educator [facility]  Instruction Review Code 1- Verbalizes Understanding       Education: Exercise Physiology & General Exercise Guidelines: - Group verbal and written instruction with models to review the exercise physiology of the cardiovascular system and associated critical values. Provides general exercise guidelines with specific guidelines to those with heart or lung disease.    Education: Flexibility, Balance, Mind/Body Relaxation: - Group verbal and visual presentation with interactive activity on the components of exercise prescription. Introduces F.I.T.T principle from ACSM for exercise prescriptions. Reviews F.I.T.T. principles of flexibility and balance exercise training including progression. Also discusses the mind body connection.  Reviews various relaxation techniques to help reduce and manage stress (i.e. Deep breathing, progressive muscle relaxation, and visualization). Balance handout provided to take home. Written material given at graduation.   Activity Barriers & Risk Stratification:  Activity Barriers & Cardiac Risk Stratification - 08/25/23 1516       Activity Barriers & Cardiac Risk Stratification   Activity Barriers Shortness of Breath;Left Hip Replacement;Back Problems             6 Minute Walk:  6 Minute Walk     Row Name 08/25/23 1514         6 Minute Walk   Phase Initial     Distance 930 feet     Walk Time 6 minutes     MPH 1.76     METS 1.3     RPE 13     Perceived Dyspnea  1     VO2 Peak 4.5     Symptoms No     Resting HR 86 bpm     Resting BP 106/68     Resting  Oxygen Saturation  96 %  Exercise Oxygen Saturation  during 6 min walk 93 %     Max Ex. HR 102 bpm     Max Ex. BP 126/74     2 Minute Post BP 106/64       Interval HR   1 Minute HR 98     2 Minute HR 101     3 Minute HR 102     4 Minute HR 68     5 Minute HR 65     6 Minute HR 96     2 Minute Post HR 87     Interval Heart Rate? Yes       Interval Oxygen   Interval Oxygen? Yes     Baseline Oxygen Saturation % 96 %     1 Minute Oxygen Saturation % 98 %     1 Minute Liters of Oxygen 1 L     2 Minute Oxygen Saturation % 93 %     2 Minute Liters of Oxygen 1 L     3 Minute Oxygen Saturation % 94 %     3 Minute Liters of Oxygen 1 L     4 Minute Oxygen Saturation % 93 %     4 Minute Liters of Oxygen 1 L     5 Minute Oxygen Saturation % 94 %     5 Minute Liters of Oxygen 1 L     6 Minute Oxygen Saturation % 97 %     6 Minute Liters of Oxygen 1 L     2 Minute Post Oxygen Saturation % 96 %     2 Minute Post Liters of Oxygen 1 L             Oxygen Initial Assessment:  Oxygen Initial Assessment - 08/25/23 1521       Home Oxygen   Home Oxygen Device None      Initial 6 min Walk   Oxygen Used Continuous    Liters per minute 1      Program Oxygen Prescription   Program Oxygen Prescription Continuous    Liters per minute 1    Comments use as needed      Intervention   Short Term Goals To learn and exhibit compliance with exercise, home and travel O2 prescription;To learn and understand importance of monitoring SPO2 with pulse oximeter and demonstrate accurate use of the pulse oximeter.;To learn and understand importance of maintaining oxygen saturations>88%;To learn and demonstrate proper pursed lip breathing techniques or other breathing techniques. ;To learn and demonstrate proper use of respiratory medications    Long  Term Goals Exhibits compliance with exercise, home  and travel O2 prescription;Verbalizes importance of monitoring SPO2 with pulse oximeter and return  demonstration;Maintenance of O2 saturations>88%;Exhibits proper breathing techniques, such as pursed lip breathing or other method taught during program session;Compliance with respiratory medication;Demonstrates proper use of MDI's             Oxygen Re-Evaluation:  Oxygen Re-Evaluation     Row Name 08/27/23 1456 08/31/23 1411 10/08/23 1353         Program Oxygen Prescription   Program Oxygen Prescription -- Continuous Continuous;None     Liters per minute -- 1 --     Comments -- use as needed use as needed       Home Oxygen   Home Oxygen Device -- Portable Concentrator;Home Concentrator Portable Concentrator;Home Concentrator     Sleep Oxygen Prescription -- CPAP CPAP     Liters per  minute -- 0 0     Home Exercise Oxygen Prescription -- Continuous None     Liters per minute -- 1  PRN --     Home Resting Oxygen Prescription -- None None     Compliance with Home Oxygen Use -- Yes Yes       Goals/Expected Outcomes   Short Term Goals -- To learn and demonstrate proper pursed lip breathing techniques or other breathing techniques.  To learn and understand importance of maintaining oxygen saturations>88%;To learn and understand importance of monitoring SPO2 with pulse oximeter and demonstrate accurate use of the pulse oximeter.     Long  Term Goals -- Exhibits proper breathing techniques, such as pursed lip breathing or other method taught during program session Verbalizes importance of monitoring SPO2 with pulse oximeter and return demonstration;Maintenance of O2 saturations>88%     Comments Reviewed PLB technique with pt.  Talked about how it works and it's importance in maintaining their exercise saturations. Informed patient how to perform the Pursed Lipped breathing technique. Told patient to Inhale through the nose and out the mouth with pursed lips to keep their airways open, help oxygenate them better, practice when at rest or doing strenuous activity. Patient Verbalizes  understanding of technique and will work on and be reiterated during [facility]. Navi has a pulse oximeter to check her oxygen saturation at home. Informed and explained why it is important to have one. Reviewed that oxygen saturations should be 88 percent and above. Patient verbalizes understanding.     Goals/Expected Outcomes Short: Become more profiecient at using PLB. Long: Become independent at using PLB. Short: use PLB with exertion. Long: use PLB on exertion proficiently and independently. Short: monitor oxygen at home with exertion. Long: maintain oxygen saturations above 88 percent independently.              Oxygen Discharge (Final Oxygen Re-Evaluation):  Oxygen Re-Evaluation - 10/08/23 1353       Program Oxygen Prescription   Program Oxygen Prescription Continuous;None    Comments use as needed      Home Oxygen   Home Oxygen Device Portable Concentrator;Home Concentrator    Sleep Oxygen Prescription CPAP    Liters per minute 0    Home Exercise Oxygen Prescription None    Home Resting Oxygen Prescription None    Compliance with Home Oxygen Use Yes      Goals/Expected Outcomes   Short Term Goals To learn and understand importance of maintaining oxygen saturations>88%;To learn and understand importance of monitoring SPO2 with pulse oximeter and demonstrate accurate use of the pulse oximeter.    Long  Term Goals Verbalizes importance of monitoring SPO2 with pulse oximeter and return demonstration;Maintenance of O2 saturations>88%    Comments Ulani has a pulse oximeter to check her oxygen saturation at home. Informed and explained why it is important to have one. Reviewed that oxygen saturations should be 88 percent and above. Patient verbalizes understanding.    Goals/Expected Outcomes Short: monitor oxygen at home with exertion. Long: maintain oxygen saturations above 88 percent independently.             Initial Exercise Prescription:  Initial Exercise Prescription -  08/25/23 1500       Date of Initial Exercise RX and Referring Provider   Date 08/25/23    Referring Provider Dr. Luciano Cutter      Oxygen   Oxygen Continuous    Liters 1L    Maintain Oxygen Saturation 88% or higher  NuStep   Level 1    SPM 80    Minutes 15    METs 1.3      Arm Ergometer   Level 1    Watts 25    RPM 50    Minutes 15    METs 1.3      REL-XR   Level 1    Speed 50    Minutes 15    METs 1.3      Track   Laps 10    Minutes 15    METs 1.54      Prescription Details   Duration Progress to 30 minutes of continuous aerobic without signs/symptoms of physical distress      Intensity   THRR 40-80% of Max Heartrate 110-135    Ratings of Perceived Exertion 11-13    Perceived Dyspnea 0-4      Progression   Progression Continue to progress workloads to maintain intensity without signs/symptoms of physical distress.      Resistance Training   Training Prescription Yes    Weight 2lb    Reps 10-15             Perform Capillary Blood Glucose checks as needed.  Exercise Prescription Changes:   Exercise Prescription Changes     Row Name 08/25/23 1500 09/10/23 1200 10/07/23 1100 10/14/23 1400 10/20/23 1500     Response to Exercise   Blood Pressure (Admit) 106/68 104/62 118/64 -- 114/78   Blood Pressure (Exercise) 126/74 128/72 -- -- 156/70   Blood Pressure (Exit) 106/64 108/62 122/66 -- 126/72   Heart Rate (Admit) 86 bpm 86 bpm 110 bpm -- 92 bpm   Heart Rate (Exercise) 102 bpm 101 bpm 113 bpm -- 113 bpm   Heart Rate (Exit) 87 bpm 92 bpm 109 bpm -- 90 bpm   Oxygen Saturation (Admit) 96 % 94 % 91 % -- 98 %   Oxygen Saturation (Exercise) 93 % 92 % 91 % -- 89 %   Oxygen Saturation (Exit) 96 % 92 % 93 % -- 96 %   Rating of Perceived Exertion (Exercise) 13 13 16  -- 15   Perceived Dyspnea (Exercise) 1 0 0 -- 1   Symptoms none none none -- none   Comments results First two weeks of exercise -- -- --   Duration -- Progress to 30 minutes of   aerobic without signs/symptoms of physical distress Progress to 30 minutes of  aerobic without signs/symptoms of physical distress Progress to 30 minutes of  aerobic without signs/symptoms of physical distress Progress to 30 minutes of  aerobic without signs/symptoms of physical distress   Intensity -- THRR unchanged THRR unchanged THRR unchanged THRR unchanged     Progression   Progression -- Continue to progress workloads to maintain intensity without signs/symptoms of physical distress. Continue to progress workloads to maintain intensity without signs/symptoms of physical distress. Continue to progress workloads to maintain intensity without signs/symptoms of physical distress. Continue to progress workloads to maintain intensity without signs/symptoms of physical distress.   Average METs -- 2.12 2.28 2.28 2.5     Resistance Training   Training Prescription -- Yes Yes Yes Yes   Weight -- 2lb 2lb 2lb 2lb   Reps -- 10-15 10-15 10-15 10-15     Interval Training   Interval Training -- No No No No     Oxygen   Oxygen -- Continuous Continuous Continuous Continuous   Liters -- 0-1L 0-1L 0-1L 0-1L     Recumbant  Bike   Level -- -- -- -- 3   Watts -- -- -- -- 20   Minutes -- -- -- -- 15   METs -- -- -- -- 2.73     NuStep   Level -- 2 3 3 4    Minutes -- 15 15 15 15    METs -- 2.9 3 3  2.8     Arm Ergometer   Level -- -- 1 1 1    Minutes -- -- 15 15 15    METs -- -- 1 1 2.4     REL-XR   Level -- -- 1 1 4    Minutes -- -- 15 15 15    METs -- -- 2.1 2.1 3.6     T5 Nustep   Level -- -- -- -- 3   Minutes -- -- -- -- 15   METs -- -- -- -- 2.1     Track   Laps -- -- 14 14 20    Minutes -- -- 15 15 15    METs -- -- 1.76 1.76 2.09     Home Exercise Plan   Plans to continue exercise at -- -- -- Home (comment)  walking and hand weights Home (comment)  walking and hand weights   Frequency -- -- -- Add 2 additional days to program exercise sessions. Add 2 additional days to program exercise  sessions.   Initial Home Exercises Provided -- -- -- 10/14/23 10/14/23     Oxygen   Maintain Oxygen Saturation -- 88% or higher 88% or higher 88% or higher 88% or higher            Exercise Comments:   Exercise Comments     Row Name 08/27/23 1455           Exercise Comments First full day of exercise!  Patient was oriented to gym and equipment including functions, settings, policies, and procedures.  Patient's individual exercise prescription and treatment plan were reviewed.  All starting workloads were established based on the results of the 6 minute walk test done at initial orientation visit.  The plan for exercise progression was also introduced and progression will be customized based on patient's performance and goals.                Exercise Goals and Review:   Exercise Goals     Row Name 08/25/23 1520             Exercise Goals   Increase Physical Activity Yes       Intervention Develop an individualized exercise prescription for aerobic and resistive training based on initial evaluation findings, risk stratification, comorbidities and participant's personal goals.;Provide advice, education, support and counseling about physical activity/exercise needs.       Expected Outcomes Long Term: Exercising regularly at least 3-5 days a week.;Long Term: Add in home exercise to make exercise part of routine and to increase amount of physical activity.;Short Term: Attend rehab on a regular basis to increase amount of physical activity.       Increase Strength and Stamina Yes       Intervention Develop an individualized exercise prescription for aerobic and resistive training based on initial evaluation findings, risk stratification, comorbidities and participant's personal goals.;Provide advice, education, support and counseling about physical activity/exercise needs.       Expected Outcomes Long Term: Improve cardiorespiratory fitness, muscular endurance and strength as  measured by increased METs and functional capacity ( );Short Term: Perform resistance training exercises routinely during rehab and add in  resistance training at home;Short Term: Increase workloads from initial exercise prescription for resistance, speed, and METs.       Able to understand and use rate of perceived exertion (RPE) scale Yes       Intervention Provide education and explanation on how to use RPE scale       Expected Outcomes Long Term:  Able to use RPE to guide intensity level when exercising independently;Short Term: Able to use RPE daily in rehab to express subjective intensity level       Able to understand and use Dyspnea scale Yes       Intervention Provide education and explanation on how to use Dyspnea scale       Expected Outcomes Short Term: Able to use Dyspnea scale daily in rehab to express subjective sense of shortness of breath during exertion;Long Term: Able to use Dyspnea scale to guide intensity level when exercising independently       Knowledge and understanding of Target Heart Rate Range (THRR) Yes       Intervention Provide education and explanation of THRR including how the numbers were predicted and where they are located for reference       Expected Outcomes Long Term: Able to use THRR to govern intensity when exercising independently;Short Term: Able to use daily as guideline for intensity in rehab;Short Term: Able to state/look up THRR       Able to check pulse independently Yes       Intervention Review the importance of being able to check your own pulse for safety during independent exercise;Provide education and demonstration on how to check pulse in carotid and radial arteries.       Expected Outcomes Short Term: Able to explain why pulse checking is important during independent exercise;Long Term: Able to check pulse independently and accurately       Understanding of Exercise Prescription Yes       Intervention Provide education, explanation, and written  materials on patient's individual exercise prescription       Expected Outcomes Long Term: Able to explain home exercise prescription to exercise independently;Short Term: Able to explain program exercise prescription                Exercise Goals Re-Evaluation :  Exercise Goals Re-Evaluation     Row Name 08/27/23 1455 09/10/23 1219 10/07/23 1115 10/14/23 1401 10/20/23 1543     Exercise Goal Re-Evaluation   Exercise Goals Review Able to understand and use rate of perceived exertion (RPE) scale;Able to understand and use Dyspnea scale;Knowledge and understanding of Target Heart Rate Range (THRR);Understanding of Exercise Prescription Increase Physical Activity;Increase Strength and Stamina;Understanding of Exercise Prescription Increase Physical Activity;Increase Strength and Stamina;Understanding of Exercise Prescription Increase Physical Activity;Able to understand and use rate of perceived exertion (RPE) scale;Knowledge and understanding of Target Heart Rate Range (THRR);Understanding of Exercise Prescription;Increase Strength and Stamina;Able to understand and use Dyspnea scale;Able to check pulse independently Increase Physical Activity;Increase Strength and Stamina;Understanding of Exercise Prescription   Comments Reviewed RPE and dyspnea scale, THR and program prescription with pt today.  Pt voiced understanding and was given a copy of goals to take home. Palma is off to a great start in the program. She has been getting comfortable with her prescribed exercise machines, and the program itself. She was able to increase her level on the T4 nustep to level 2. We will continue to monitor her progress in the program. Carnisha continues to do well. She was able to increase  her level on the T4 nustep from 2 to 3. She was also able to maintain her intensity on the XR and track. We will continue to monitor her progress in the program. Reviewed home exercise with pt today from 1:55 pm-2:06 pm.  Pt plans  to walk and use hand weights for exercise.  Reviewed THR, pulse, RPE, sign and symptoms, pulse oximetery and when to call 911 or MD.  Also discussed weather considerations and indoor options.  Pt voiced understanding. Laken continues to do well in rehab. She was able to increase her level on the Xr from 1 to 4. She was also able to increase her laps on the track from 14 to 20 laps in 15 minutes. We will continue to monitor her progress in the program.   Expected Outcomes Short: Use RPE daily to regulate intensity. Long: Follow program prescription in THR. Short: Continue to follow current exercise prescription. Long: Continue exercise to improve strength and stamina. Short: Continue to follow current exercise prescription. Long: Continue exercise to improve strength and stamina. Short: add 1-2 days a week of exercies at home on off days of rehab. Long: become independent with exercise routine. Short: Continue to increase laps on the track. Long: Continue exercise to improve strength and stamina.            Discharge Exercise Prescription (Final Exercise Prescription Changes):  Exercise Prescription Changes - 10/20/23 1500       Response to Exercise   Blood Pressure (Admit) 114/78    Blood Pressure (Exercise) 156/70    Blood Pressure (Exit) 126/72    Heart Rate (Admit) 92 bpm    Heart Rate (Exercise) 113 bpm    Heart Rate (Exit) 90 bpm    Oxygen Saturation (Admit) 98 %    Oxygen Saturation (Exercise) 89 %    Oxygen Saturation (Exit) 96 %    Rating of Perceived Exertion (Exercise) 15    Perceived Dyspnea (Exercise) 1    Symptoms none    Duration Progress to 30 minutes of  aerobic without signs/symptoms of physical distress    Intensity THRR unchanged      Progression   Progression Continue to progress workloads to maintain intensity without signs/symptoms of physical distress.    Average METs 2.5      Resistance Training   Training Prescription Yes    Weight 2lb    Reps 10-15       Interval Training   Interval Training No      Oxygen   Oxygen Continuous    Liters 0-1L      Recumbant Bike   Level 3    Watts 20    Minutes 15    METs 2.73      NuStep   Level 4    Minutes 15    METs 2.8      Arm Ergometer   Level 1    Minutes 15    METs 2.4      REL-XR   Level 4    Minutes 15    METs 3.6      T5 Nustep   Level 3    Minutes 15    METs 2.1      Track   Laps 20    Minutes 15    METs 2.09      Home Exercise Plan   Plans to continue exercise at Home (comment)   walking and hand weights   Frequency Add 2 additional days to program  exercise sessions.    Initial Home Exercises Provided 10/14/23      Oxygen   Maintain Oxygen Saturation 88% or higher             Nutrition:  Target Goals: Understanding of nutrition guidelines, daily intake of sodium 1500mg , cholesterol 200mg , calories 30% from fat and 7% or less from saturated fats, daily to have 5 or more servings of fruits and vegetables.  Education: All About Nutrition: -Group instruction provided by verbal, written material, interactive activities, discussions, models, and posters to present general guidelines for heart healthy nutrition including fat, fiber, MyPlate, the role of sodium in heart healthy nutrition, utilization of the nutrition label, and utilization of this knowledge for meal planning. Follow up email sent as well. Written material given at graduation. Flowsheet Row [facility]  Education need identified 08/25/23  Date 09/23/23  Educator JG  Instruction Review Code 1- Verbalizes Understanding       Biometrics:  Pre Biometrics - 08/25/23 1520       Pre Biometrics   Height 5' 2.1" (1.577 m)    Weight 231 lb 12.8 oz (105.1 kg)    Waist Circumference 48 inches    Hip Circumference 51 inches    Waist to Hip Ratio 0.94 %    BMI (Calculated) 42.28    Single Leg Stand 3.5 seconds              Nutrition  Therapy Plan and Nutrition Goals:  Nutrition Therapy & Goals - 08/24/23 1340       Intervention Plan   Intervention Prescribe, educate and counsel regarding individualized specific dietary modifications aiming towards targeted core components such as weight, hypertension, lipid management, diabetes, heart failure and other comorbidities.    Expected Outcomes Short Term Goal: A plan has been developed with personal nutrition goals set during dietitian appointment.;Short Term Goal: Understand basic principles of dietary content, such as calories, fat, sodium, cholesterol and nutrients.;Long Term Goal: Adherence to prescribed nutrition plan.             Nutrition Assessments:  MEDIFICTS Score Key: >=70 Need to make dietary changes  40-70 Heart Healthy Diet <= 40 Therapeutic Level Cholesterol Diet  Flowsheet Row Pulmonary Rehab from 08/25/2023 in [facility] Pulmonary [facility]  Picture Your Plate Total Score on Admission 53      Picture Your Plate Scores: <40 Unhealthy dietary pattern with much room for improvement. 41-50 Dietary pattern unlikely to meet recommendations for good health and room for improvement. 51-60 More healthful dietary pattern, with some room for improvement.  >60 Healthy dietary pattern, although there may be some specific behaviors that could be improved.   Nutrition Goals Re-Evaluation:  Nutrition Goals Re-Evaluation     Row Name 08/31/23 1414 10/08/23 1356           Goals   Current Weight 234 lb (106.1 kg) 238 lb (108 kg)      Comment Patient was informed on why it is important to maintain a balanced diet when dealing with Respiratory issues. Explained that it takes a lot of energy to breath and when they are short of breath often they will need to have a good diet to help keep up with the calories they are expending for breathing. Deferred RD appointment      Expected Outcome Short: Choose and plan snacks accordingly to patients caloric intake to  improve breathing. Long: Maintain a diet independently that meets their  caloric intake to aid in daily shortness of breath. --               Nutrition Goals Discharge (Final Nutrition Goals Re-Evaluation):  Nutrition Goals Re-Evaluation - 10/08/23 1356       Goals   Current Weight 238 lb (108 kg)    Comment Deferred RD appointment             Psychosocial: Target Goals: Acknowledge presence or absence of significant depression and/or stress, maximize coping skills, provide positive support system. Participant is able to verbalize types and ability to use techniques and skills needed for reducing stress and depression.   Education: Stress, Anxiety, and Depression - Group verbal and visual presentation to define topics covered.  Reviews how body is impacted by stress, anxiety, and depression.  Also discusses healthy ways to reduce stress and to treat/manage anxiety and depression.  Written material given at graduation.   Education: Sleep Hygiene -Provides group verbal and written instruction about how sleep can affect your health.  Define sleep hygiene, discuss sleep cycles and impact of sleep habits. Review good sleep hygiene tips.    Initial Review & Psychosocial Screening:  Initial Psych Review & Screening - 08/24/23 1337       Initial Review   Current issues with Current Psychotropic Meds;Current Depression   had to lose 100 lbs off to get knee surgery, very stressful     Family Dynamics   Good Support System? Yes   daughter  in [place]     Barriers   Psychosocial barriers to participate in program There are no identifiable barriers or psychosocial needs.      Screening Interventions   Interventions Encouraged to exercise;Provide feedback about the scores to participant;To provide support and resources with identified psychosocial needs    Expected Outcomes Short Term goal: Utilizing psychosocial counselor, staff and physician to assist with identification of  specific Stressors or current issues interfering with healing process. Setting desired goal for each stressor or current issue identified.;Long Term Goal: Stressors or current issues are controlled or eliminated.;Short Term goal: Identification and review with participant of any Quality of Life or Depression concerns found by scoring the questionnaire.;Long Term goal: The participant improves quality of Life and PHQ9 Scores as seen by post scores and/or verbalization of changes             Quality of Life Scores:  Scores of 19 and below usually indicate a poorer quality of life in these areas.  A difference of  2-3 points is a clinically meaningful difference.  A difference of 2-3 points in the total score of the Quality of Life Index has been associated with significant improvement in overall quality of life, self-image, physical symptoms, and general health in studies assessing change in quality of life.  PHQ-9: Review Flowsheet  More data exists      10/16/2023 09/28/2023 08/26/2023 08/25/2023 07/20/2023  Depression screen PHQ 2/9  Decreased Interest 0  0 0 0  Down, Depressed, Hopeless 0  0 0 0  PHQ - 2 Score 0  0 0 0  Altered sleeping 0 - 0 0 0  Tired, decreased energy 0 - 0 0 0  Change in appetite 0 - 0 0 0  Feeling bad or failure about yourself  0 - 0 0 0  Trouble concentrating 0 - 0 0 0  Moving slowly or fidgety/restless 0 - 0 0 0  Suicidal thoughts 0 - 0 0 0  PHQ-9 Score 0 -  0 0 0  Difficult doing work/chores - - Not difficult at all Not difficult at all Not difficult at all    Details       Information is confidential and restricted. Go to Review Flowsheets to unlock data.        Interpretation of Total Score  Total Score Depression Severity:  1-4 = Minimal depression, 5-9 = Mild depression, 10-14 = Moderate depression, 15-19 = Moderately severe depression, 20-27 = Severe depression   Psychosocial Evaluation and Intervention:  Psychosocial Evaluation - 08/24/23 1451        Psychosocial Evaluation & Interventions   Comments Tashanda has no barriers to starting the program.  She is ready to start an exercise program . She lives alone and has a 58 year old cat. Her daughter (in [place])is her support. She recently moved to [facility] and a month later had a hip replacement. She is healing well.    Expected Outcomes STG attend all scheduled sesions, continue to heal from her surgery and is able to progress her exercise. LTG continues with exercise progression after discharge    Continue Psychosocial Services  Follow up required by staff             Psychosocial Re-Evaluation:  Psychosocial Re-Evaluation     Row Name 08/31/23 1415 10/08/23 1357           Psychosocial Re-Evaluation   Current issues with Current Psychotropic Meds Current Psychotropic Meds;Current Sleep Concerns      Comments Her kids did not tell her that her ex husband had died on Thanksgiving this year. She takes some medications to help her mood and has been upset since hearing that news. Her daughter lives in [place] and is a good family support system. Lashea states that her cat was turning off her CPAP machine and has since locked her cats out of her room when she sleeps. Her sleeping has got alot better since her CPAP is not getting turned off at night.      Expected Outcomes Short: Attend [facility] stress management education to decrease stress. Long: Maintain exercise Post [facility] to keep stress at a minimum. Short: continue to use CPAP for sleep and stress. Long: maintain a healthy mental state.      Interventions Encouraged to attend Pulmonary Rehabilitation for the exercise Encouraged to attend Pulmonary Rehabilitation for the exercise      Continue Psychosocial Services  Follow up required by staff Follow up required by staff               Psychosocial Discharge (Final Psychosocial Re-Evaluation):  Psychosocial Re-Evaluation - 10/08/23 1357       Psychosocial  Re-Evaluation   Current issues with Current Psychotropic Meds;Current Sleep Concerns    Comments Hindy states that her cat was turning off her CPAP machine and has since locked her cats out of her room when she sleeps. Her sleeping has got alot better since her CPAP is not getting turned off at night.    Expected Outcomes Short: continue to use CPAP for sleep and stress. Long: maintain a healthy mental state.    Interventions Encouraged to attend Pulmonary Rehabilitation for the exercise    Continue Psychosocial Services  Follow up required by staff             Education: Education Goals: Education classes will be provided on a weekly basis, covering required topics. Participant will state understanding/return demonstration of topics presented.  Learning Barriers/Preferences:  Learning Barriers/Preferences - 08/24/23  1341       Learning Barriers/Preferences   Learning Barriers None    Learning Preferences None             General Pulmonary Education Topics:  Infection Prevention: - Provides verbal and written material to individual with discussion of infection control including proper hand washing and proper equipment cleaning during exercise session. Flowsheet Row Pulmonary Rehab from 09/23/2023 in [facility] Card[facility] and Pulmonary Rehab  Date 08/25/23  Educator [facility]  Instruction Review Code 1- Verbalizes Understanding       Falls Prevention: - Provides verbal and written material to individual with discussion of falls prevention and safety. Flowsheet Row Pulmonary Rehab from 09/23/2023 in [facility] Card[facility] and Pulmonary Rehab  Date 08/25/23  Educator [facility]  Instruction Review Code 1- Verbalizes Understanding       Chronic Lung Disease Review: - Group verbal instruction with posters, models, PowerPoint presentations and videos,  to review new updates, new respiratory medications, new advancements in procedures and treatments. Providing information on websites and "800" numbers for  continued self-education. Includes information about supplement oxygen, available portable oxygen systems, continuous and intermittent flow rates, oxygen safety, concentrators, and Medicare reimbursement for oxygen. Explanation of Pulmonary Drugs, including class, frequency, complications, importance of spacers, rinsing mouth after steroid MDI's, and proper cleaning methods for nebulizers. Review of basic lung anatomy and physiology related to function, structure, and complications of lung disease. Review of risk factors. Discussion about methods for diagnosing sleep apnea and types of masks and machines for OSA. Includes a review of the use of types of environmental controls: home humidity, furnaces, filters, dust mite/pet prevention, HEPA vacuums. Discussion about weather changes, air quality and the benefits of nasal washing. Instruction on Warning signs, infection symptoms, calling MD promptly, preventive modes, and value of vaccinations. Review of effective airway clearance, coughing and/or vibration techniques. Emphasizing that all should Create an Action Plan. Written material given at graduation. Flowsheet Row Pulmonary Rehab from 09/23/2023 in [facility] Card[facility] Pulmonary Rehab  Education need identified 08/25/23       AED/CPR: - Group verbal and written instruction with the use of models to demonstrate the basic use of the AED with the basic ABC's of resuscitation.    Anatomy and Cardiac Procedures: - Group verbal and visual presentation and models provide information about basic cardiac anatomy and function. Reviews the testing methods done to diagnose heart disease and the outcomes of the test results. Describes the treatment choices: Medical Management, Angioplasty, or Coronary Bypass Surgery for treating various heart conditions including Myocardial Infarction, Angina, Valve Disease, and Cardiac Arrhythmias.  Written material given at graduation.   Medication Safety: - Group verbal and  visual instruction to review commonly prescribed medications for heart and lung disease. Reviews the medication, class of the drug, and side effects. Includes the steps to properly store meds and maintain the prescription regimen.  Written material given at graduation.   Other: -Provides group and verbal instruction on various topics (see comments)   Knowledge Questionnaire Score:  Knowledge Questionnaire Score - 08/25/23 1525       Knowledge Questionnaire Score   Pre Score 14/18              Core Components/Risk Factors/Patient Goals at Admission:  Personal Goals and Risk Factors at Admission - 08/24/23 1343       Core Components/Risk Factors/Patient Goals on Admission    Weight Management Yes;Weight Maintenance    Intervention Weight Management: Develop a combined nutrition and exercise program designed  to reach desired caloric intake, while maintaining appropriate intake of nutrient and fiber, sodium and fats, and appropriate energy expenditure required for the weight goal.;Weight Management: Provide education and appropriate resources to help participant work on and attain dietary goals.    Admit Weight 230 lb (104.3 kg)   weighed 290 lb prior to knee surgery worked down to 223 lb   Goal Weight: Long Term 230 lb (104.3 kg)    Expected Outcomes Short Term: Continue to assess and modify interventions until short term weight is achieved;Long Term: Adherence to nutrition and physical activity/exercise program aimed toward attainment of established weight goal;Weight Maintenance: Understanding of the daily nutrition guidelines, which includes 25-35% calories from fat, 7% or less cal from saturated fats, less than 200mg  cholesterol, less than 1.5gm of sodium, & 5 or more servings of fruits and vegetables daily    Improve shortness of breath with ADL's Yes    Intervention Provide education, individualized exercise plan and daily activity instruction to help decrease symptoms of SOB with  activities of daily living.    Expected Outcomes Short Term: Improve cardiorespiratory fitness to achieve a reduction of symptoms when performing ADLs;Long Term: Be able to perform more ADLs without symptoms or delay the onset of symptoms    Increase knowledge of respiratory medications and ability to use respiratory devices properly  Yes    Intervention Provide education and demonstration as needed of appropriate use of medications, inhalers, and oxygen therapy.    Expected Outcomes Short Term: Achieves understanding of medications use. Understands that oxygen is a medication prescribed by physician. Demonstrates appropriate use of inhaler and oxygen therapy.;Long Term: Maintain appropriate use of medications, inhalers, and oxygen therapy.    Lipids Yes    Intervention Provide education and support for participant on nutrition & aerobic/resistive exercise along with prescribed medications to achieve LDL 70mg , HDL >40mg .    Expected Outcomes Short Term: Participant states understanding of desired cholesterol values and is compliant with medications prescribed. Participant is following exercise prescription and nutrition guidelines.;Long Term: Cholesterol controlled with medications as prescribed, with individualized exercise RX and with personalized nutrition plan. Value goals: LDL < 70mg , HDL > 40 mg.             Education:Diabetes - Individual verbal and written instruction to review signs/symptoms of diabetes, desired ranges of glucose level fasting, after meals and with exercise. Acknowledge that pre and post exercise glucose checks will be done for 3 sessions at entry of program.   Know Your Numbers and Heart Failure: - Group verbal and visual instruction to discuss disease risk factors for cardiac and pulmonary disease and treatment options.  Reviews associated critical values for Overweight/Obesity, Hypertension, Cholesterol, and Diabetes.  Discusses basics of heart failure:  signs/symptoms and treatments.  Introduces Heart Failure Zone chart for action plan for heart failure.  Written material given at graduation.   Core Components/Risk Factors/Patient Goals Review:   Goals and Risk Factor Review     Row Name 08/31/23 1413 10/08/23 1359           Core Components/Risk Factors/Patient Goals Review   Personal Goals Review Improve shortness of breath with ADL's Improve shortness of breath with ADL's      Review Spoke to patient about their shortness of breath and what they can do to improve. Patient has been informed of breathing techniques when starting the program. Patient is informed to tell staff if they have had any med changes and that certain meds they are taking  or not taking can be causing shortness of breath. Ellorie states she is not checking her blood pressure at home and does not want a blood pressure cuff at home. She states she gets her reading at the doctors and at rehab and her blood pressure is good. Her blood pressure has been stable in the program with no concerning readings. Her weight has gone up slightly and wants to lose about 50 pounds.      Expected Outcomes Short: Attend [facility] regularly to improve shortness of breath with ADL's. Long: maintain independence with ADL's Short: lose a few pounds in a few weeks. Long: reach your weight goal.               Core Components/Risk Factors/Patient Goals at Discharge (Final Review):   Goals and Risk Factor Review - 10/08/23 1359       Core Components/Risk Factors/Patient Goals Review   Personal Goals Review Improve shortness of breath with ADL's    Review Kailly states she is not checking her blood pressure at home and does not want a blood pressure cuff at home. She states she gets her reading at the doctors and at rehab and her blood pressure is good. Her blood pressure has been stable in the program with no concerning readings. Her weight has gone up slightly and wants to lose about 50  pounds.    Expected Outcomes Short: lose a few pounds in a few weeks. Long: reach your weight goal.             ITP Comments:  ITP Comments     Row Name 08/24/23 1354 08/25/23 1513 08/26/23 1211 08/27/23 1455 09/23/23 1313   ITP Comments Virtual orientation call completed today. shehas an appointment on Date: 08/25/2023  for EP eval and gym Orientation.  Documentation of diagnosis [facility] in [facility] Date: 08/11/2023 . Completed and gym orientation. Initial ITP created and sent for review to Dr. Jinny Sanders, Medical Director. 30 Day review completed. Medical Director ITP review done, changes made as directed, and signed approval by Medical Director.    new to program orientation only completed First full day of exercise!  Patient was oriented to gym and equipment including functions, settings, policies, and procedures.  Patient's individual exercise prescription and treatment plan were reviewed.  All starting workloads were established based on the results of the 6 minute walk test done at initial orientation visit.  The plan for exercise progression was also introduced and progression will be customized based on patient's performance and goals. 30 Day review completed. Medical Director ITP review done, changes made as directed, and signed approval by Medical Director.    new to program    Row Name 10/21/23 1130           ITP Comments 30 Day review completed. Medical Director ITP review done, changes made as directed, and signed approval by Medical Director.                Comments:

## 2023-10-22 ENCOUNTER — Encounter: Payer: Self-pay | Admitting: Oncology

## 2023-10-22 NOTE — Progress Notes (Signed)
Hematology/Oncology Consult note [facility] Telephone:([phone] [phone]  Patient Care Team: Sherlyn Hay, DO as PCP - General (Family Medicine) Luciano Cutter, MD as Consulting Physician (Pulmonary Disease) Alfredo Martinez, MD as Consulting Physician (Urology) [facility], [facility] (Orthopedic Surgery) Shilt, Arnetha Massy, PA-C (Orthopedic Surgery) Birder Robson, MD as Consulting Physician (Allergy and Immunology) Celso Amy, PA-C as Physician Assistant (Gastroenterology) [facility], [facility])   Name of the patient: Faith Padilla  [id number]  [birth year]    Reason for referral-referred for concern of iron deficiency anemia   Referring physician-Dr. Jacquenette Shone  Date of visit: 10/22/23   History of presenting illness-patient is a 73 year old female with prior history of iron deficiency anemia requiring IV iron few years ago.  She wanted to see a hematologist as she was concerned that her anemia may be coming back.  She reports dizziness when she stands up and also feels that her nails are more brittle than before and she is losing more hair.  She had CBC checked on 10/16/2023 which showed a normal H&H of 15/46.4.  Platelet counts were normal and white count was mildly elevated 11.3.  Iron studies were normal with a ferritin level of 81 and iron saturation of 20%.  Normal TIBC.  B12 levels normal at 1114 and TSH normal.  ECOG PS- 2  Pain scale- 0   Review of systems- Review of Systems  Constitutional:  Positive for malaise/fatigue. Negative for chills, fever and weight loss.  HENT:  Negative for congestion, ear discharge and nosebleeds.   Eyes:  Negative for blurred vision.  Respiratory:  Negative for cough, hemoptysis, sputum production, shortness of breath and wheezing.   Cardiovascular:  Negative for chest pain, palpitations, orthopnea and claudication.  Gastrointestinal:  Negative for abdominal pain, blood in stool,  constipation, diarrhea, heartburn, melena, nausea and vomiting.  Genitourinary:  Negative for dysuria, flank pain, frequency, hematuria and urgency.  Musculoskeletal:  Negative for back pain, joint pain and myalgias.  Skin:  Negative for rash.  Neurological:  Positive for dizziness. Negative for tingling, focal weakness, seizures, weakness and headaches.  Endo/Heme/Allergies:  Does not bruise/bleed easily.  Psychiatric/Behavioral:  Negative for depression and suicidal ideas. The patient does not have insomnia.     Allergies  Allergen Reactions   Morphine Hives    Patient Active Problem List   Diagnosis Date Noted   Acute bronchitis 10/16/2023   Chronic hypoxemic respiratory failure (HCC) 08/11/2023   Encounter for weight loss counseling 08/03/2023   Chronic gout without tophus 08/03/2023   Thrombocytosis 07/20/2023   Hypercalcemia 07/20/2023   Elevated vitamin B12 level 07/20/2023   Fibromyalgia 07/20/2023   Pulmonary fibrosis (HCC) 07/20/2023   Iron deficiency anemia secondary to inadequate dietary iron intake 07/20/2023   Hypothyroidism 07/20/2023   Age-related osteoporosis without current pathological fracture 07/20/2023   Depression, major, single episode, in partial remission (HCC) 07/20/2023   Chronic kidney disease, stage 2, mildly decreased GFR 07/20/2023   Benign neoplasm of transverse colon    Benign neoplasm of respiratory organ    Problems with swallowing and mastication    Hiatal hernia    Seasonal allergic conjunctivitis 12/17/2015   Cystocele, grade 2 11/21/2015   Microscopic hematuria 11/21/2015   Esophageal motility disorder 11/19/2015   Vertigo 10/15/2015   Oral candidiasis 10/15/2015   Subacute frontal sinusitis 10/10/2015   Chest pain at rest 10/10/2015   Prediabetes 08/14/2015   Asthma with acute exacerbation 07/18/2015   Dermatitis 05/31/2015  Rosacea 05/31/2015   Incontinence 04/12/2015   Atrophic vaginitis 04/12/2015   Osteoarthritis of right  knee 03/29/2015   Airway hyperreactivity 12/11/2014   Clinical depression 12/11/2014   Diverticulitis 12/11/2014   Auditory acuity evaluation 12/11/2014   Glaucoma 12/11/2014   Hot flash, menopausal 12/11/2014   Calculus of kidney 12/11/2014   Degenerative arthritis of lumbar spine 12/11/2014   Obstructive apnea 12/11/2014   Urge incontinence 12/11/2014   Moderate persistent asthma with allergic rhinitis 12/11/2014   Hyperlipidemia 12/11/2014   Gastroesophageal reflux disease 12/11/2014     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   Diverticulitis    Dry eye syndrome of both eyes 08/13/2021   Dyspnea on exertion 08/13/2021   Family history of problems related to stress 11/15/2021   Frequency of urination and polyuria 06/25/2022   GAD (generalized anxiety disorder) 08/15/2021   Glaucoma    H/O urinary incontinence 06/25/2022   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   High risk medication use 08/02/2018   HLD (hyperlipidemia)    Iron deficiency 12/04/2019   Isolation (social) 12/07/2021   Kidney stones    Microscopic hematuria    Mixed incontinence    Morbid obesity (HCC)    Ocular hypertension, 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   Ocular migraine 08/02/2018   Osteoarthritis    lumbar spine   Osteoarthritis of right knee 08/28/2023   Palpitations 08/13/2021   Plantar fasciitis 06/25/2023   Post covid-19  condition, 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   Postoperative follow-up 03/30/2020   Prediabetes 08/14/2015   Pseudophakia, both eyes 06/26/2016   Rectal bleeding 10/17/2021   Shortness of breath dyspnea    Sleep apnea    CPAP   Vaginal yeast infection    Wears dentures    partial upper     Past Surgical History:  Procedure Laterality Date   ABDOMINAL HYSTERECTOMY  09/08/1996   BACK SURGERY  1990/2014   CATARACT EXTRACTION, BILATERAL  09/08/2012   COLONOSCOPY WITH PROPOFOL N/A 03/25/2016   Procedure: COLONOSCOPY WITH PROPOFOL;  Surgeon: Midge Minium, MD;  Location: [facility] ENDOSCOPY;  Service: Endoscopy;  Laterality: N/A;   deviated nose septum surgery  09/08/1998   ESOPHAGOGASTRODUODENOSCOPY (EGD) WITH PROPOFOL N/A 03/25/2016   Procedure: ESOPHAGOGASTRODUODENOSCOPY (EGD) WITH PROPOFOL;  Surgeon: Midge Minium, MD;  Location: [facility] ENDOSCOPY;  Service: Endoscopy;  Laterality: N/A;   EYE SURGERY     LITHOTRIPSY  09/08/2010   TOTAL HIP ARTHROPLASTY Left     Social History   Socioeconomic History   Marital status: Divorced    Spouse name: Not on file   Number of children: 3   Years of education: Not on file   Highest education level: Some college, no degree  Occupational History   Occupation: retired  Tobacco Use   Smoking status: Never    Passive exposure: Never (Parent)   Smokeless tobacco: Never  Vaping Use   Vaping status: Never Used  Substance  and Sexual Activity   Alcohol use: No   Drug use: No   Sexual activity: Never    Birth control/protection: Surgical  Other Topics Concern   Not on file  Social History Narrative   Not on file   Social Drivers of Health   Financial Resource Strain: Low Risk  (08/25/2023)   Overall Financial Resource Strain (CARDIA)    Difficulty of Paying Living Expenses: Not hard at all  Food Insecurity: No Food Insecurity (10/21/2023)   Hunger Vital Sign    Worried About Running Out of Food in the Last Year: Never true    Ran Out of Food in the  Last Year: Never true  Transportation Needs: No Transportation Needs (10/21/2023)   PRAPARE - Administrator, Civil Service (Medical): No    Lack of Transportation (Non-Medical): No  Physical Activity: Insufficiently Active (08/25/2023)   Exercise Vital Sign    Days of Exercise per Week: 1 day    Minutes of Exercise per Session: 10 min  Stress: No Stress Concern Present (08/25/2023)   [employer] of Occupational Health - Occupational Stress Questionnaire    Feeling of Stress : Only a little  Social Connections: Socially Isolated (08/25/2023)   Social Connection and Isolation Panel [NHANES]    Frequency of Communication with Friends and Family: More than three times a week    Frequency of Social Gatherings with Friends and Family: Three times a week    Attends Religious Services: Never    Active Member of Clubs or Organizations: No    Attends Banker Meetings: Never    Marital Status: Divorced  Catering manager Violence: Not At Risk (10/21/2023)   Humiliation, Afraid, Rape, and Kick questionnaire    Fear of Current or Ex-Partner: No    Emotionally Abused: No    Physically Abused: No    Sexually Abused: No     Family History  Problem Relation Age of Onset   Depression Mother    Asthma Mother    Heart disease Mother    Diabetes Mother    Kidney disease Mother    Cancer Father        lung cancer   COPD Father    Liver cancer Father    Cancer Sister        Brain Cancer   Allergic rhinitis Sister    Asthma Sister    Depression Daughter    Hematuria Daughter    Bladder Cancer Neg Hx      Current Outpatient Medications:    albuterol (VENTOLIN HFA) 108 (90 Base) MCG/ACT inhaler, Inhale 1-2 puffs into the lungs every 6 (six) hours as needed for wheezing or shortness of breath., Disp: 18 g, Rfl: 1   atorvastatin (LIPITOR) 20 MG tablet, TAKE 1 TABLET(20 MG) BY MOUTH AT BEDTIME, Disp: 90 tablet, Rfl: 0   Biotin 1 MG CAPS, Take 1 capsule by mouth  daily., Disp: , Rfl:    budesonide-formoterol (SYMBICORT) 160-4.5 MCG/ACT inhaler, Inhale 2 puffs into the lungs 2 (two) times daily., Disp: 1 each, Rfl: 3   CALCIUM PO, Take by mouth., Disp: , Rfl:    cetirizine (ZYRTEC) 10 MG tablet, Take 1 tablet (10 mg total) by mouth at bedtime., Disp: 30 tablet, Rfl: 3   DULoxetine (CYMBALTA) 60 MG capsule, Take 1 capsule (60 mg total) by mouth daily. Reported on 12/26/2015, Disp: 90 capsule, Rfl: 3   fluticasone (FLONASE) 50 MCG/ACT nasal spray, Place 2 sprays into both nostrils daily.,  Disp: 16 g, Rfl: 5   Gabapentin, Once-Daily, 300 MG TABS, Take 0.5 tablets (150 mg total) by mouth at bedtime., Disp: 45 tablet, Rfl: 1   hydrOXYzine (ATARAX) 50 MG tablet, Take by mouth., Disp: , Rfl:    ipratropium (ATROVENT) 0.03 % nasal spray, Place 2 sprays into both nostrils every 12 (twelve) hours. Use in place of flonase (not both together), Disp: 30 mL, Rfl: 0   ipratropium-albuterol (DUONEB) 0.5-2.5 (3) MG/3ML SOLN, Take 3 mLs by nebulization every 4 (four) hours as needed., Disp: 360 mL, Rfl: 3   levothyroxine (SYNTHROID) 75 MCG tablet, Take 1 tablet (75 mcg total) by mouth daily before breakfast., Disp: 90 tablet, Rfl: 3   meloxicam (MOBIC) 15 MG tablet, Take 1 tablet by mouth daily., Disp: , Rfl:    montelukast (SINGULAIR) 10 MG tablet, Take 1 tablet (10 mg total) by mouth at bedtime., Disp: 30 tablet, Rfl: 5   nystatin (MYCOSTATIN/NYSTOP) powder, Apply 1 Application topically 3 (three) times daily., Disp: 60 g, Rfl: 3   omeprazole (PRILOSEC) 20 MG capsule, TAKE 1 CAPSULE(20 MG) BY MOUTH TWICE DAILY BEFORE A MEAL, Disp: 180 capsule, Rfl: 0   predniSONE (DELTASONE) 20 MG tablet, Take 60mg  PO daily x 2 days, then40mg  PO daily x 2 days, then 20mg  PO daily x 3 days (Patient not taking: Reported on 10/21/2023), Disp: 13 tablet, Rfl: 0   Probiotic Product (PROBIOTIC PO), Take by mouth., Disp: , Rfl:    topiramate (TOPAMAX) 50 MG tablet, Take 1 tablet (50 mg total) by  mouth daily., Disp: 90 tablet, Rfl: 1   trimethoprim (TRIMPEX) 100 MG tablet, Take 1 tablet (100 mg total) by mouth daily., Disp: 30 tablet, Rfl: 11   Physical exam:  Vitals:   10/21/23 1501  BP: 123/63  Pulse: 82  Resp: 18  Temp: 98 F (36.7 C)  TempSrc: Tympanic  SpO2: 96%  Weight: 243 lb 9.6 oz (110.5 kg)  Height: 5\' 1"  (1.549 m)   Physical Exam Cardiovascular:     Rate and Rhythm: Normal rate and regular rhythm.     Heart sounds: Normal heart sounds.  Pulmonary:     Effort: Pulmonary effort is normal.     Breath sounds: Normal breath sounds.  Abdominal:     General: Bowel sounds are normal.     Palpations: Abdomen is soft.  Skin:    General: Skin is warm and dry.  Neurological:     Mental Status: She is alert and oriented to person, place, and time.           Latest Ref Rng & Units 07/20/2023   10:51 AM  CMP  Glucose 70 - 99 mg/dL 98   BUN 8 - 27 mg/dL 26   Creatinine 1.61 - 1.00 mg/dL 0.96   Sodium 045 - 409 mmol/L 139   Potassium 3.5 - 5.2 mmol/L 5.5   Chloride 96 - 106 mmol/L 105   CO2 20 - 29 mmol/L 22   Calcium 8.7 - 10.3 mg/dL 9.8       Latest Ref Rng & Units 10/16/2023    9:16 AM  CBC  WBC 3.4 - 10.8 x10E3/uL 11.3   Hemoglobin 11.1 - 15.9 g/dL 81.1   Hematocrit 91.4 - 46.6 % 46.4   Platelets 150 - 450 x10E3/uL 429     Assessment and plan- Patient is a 72 y.o. female with concerns of iron deficiency anemia  Patient had recent labs done 2 weeks ago which shows a normal hemoglobin  of 15 and normal iron studies B12 and TSH.  She does not have evidence of iron deficiency anemia.  She does not require follow-up with me at this time.  She will need to follow-up with her primary care doctor for her concerns of dizzinessAnd hair loss.  She can be referred to me in the future if other questions or concerns arise   Thank you for this kind referral and the opportunity to participate in the care of this patient   Visit Diagnosis 1. Iron deficiency  anemia, unspecified iron deficiency anemia type     Dr. Owens Shark, MD, MPH [facility] at [facility] [phone] 10/22/2023

## 2023-10-23 ENCOUNTER — Ambulatory Visit: Payer: 59 | Admitting: Family Medicine

## 2023-10-23 ENCOUNTER — Encounter: Payer: Self-pay | Admitting: Family Medicine

## 2023-10-23 VITALS — BP 121/75 | HR 102 | Resp 22 | Ht 61.0 in | Wt 243.0 lb

## 2023-10-23 DIAGNOSIS — R42 Dizziness and giddiness: Secondary | ICD-10-CM | POA: Diagnosis not present

## 2023-10-23 DIAGNOSIS — Z13820 Encounter for screening for osteoporosis: Secondary | ICD-10-CM

## 2023-10-23 DIAGNOSIS — R195 Other fecal abnormalities: Secondary | ICD-10-CM | POA: Insufficient documentation

## 2023-10-23 DIAGNOSIS — Z780 Asymptomatic menopausal state: Secondary | ICD-10-CM

## 2023-10-23 DIAGNOSIS — B372 Candidiasis of skin and nail: Secondary | ICD-10-CM | POA: Diagnosis not present

## 2023-10-23 DIAGNOSIS — K921 Melena: Secondary | ICD-10-CM | POA: Insufficient documentation

## 2023-10-23 DIAGNOSIS — E860 Dehydration: Secondary | ICD-10-CM

## 2023-10-23 DIAGNOSIS — Z5320 Procedure and treatment not carried out because of patient's decision for unspecified reasons: Secondary | ICD-10-CM

## 2023-10-23 NOTE — Patient Instructions (Signed)
Recommended vaccines when you're feeling better: - prevnar-20 or  -21 for pneumonia - shingrix for shingles

## 2023-10-23 NOTE — Progress Notes (Signed)
 Established Patient Visit   Patient: Faith Padilla   DOB: Nov 15, 1950   73 y.o. Female  MRN: [id number] Visit Date: 10/23/2023  Today's healthcare provider: Sherlyn Hay, DO   Chief Complaint  Patient presents with   Annual Exam   Bronchitis    Cough has improved a little, no sore throat, still becoming lightheaded, possibly worsening, continued shortness of breath, feeling fatigued   Rash    Lower abdomen x 3-4 days, not itching, but painful   Subjective     HPI HPI     Bronchitis    Additional comments: Cough has improved a little, no sore throat, still becoming lightheaded, possibly worsening, continued shortness of breath, feeling fatigued        Rash    Additional comments: Lower abdomen x 3-4 days, not itching, but painful      Last edited by Ashok Cordia, CMA on 10/23/2023  1:06 PM.     Faith Padilla is a 73 year old female who presents with persistent lightheadedness and black stools.  She has been experiencing persistent lightheadedness for the past three days, which has progressively worsened. The lightheadedness occurs even while sitting and makes it difficult to stand or walk. No chest pain, but she feels short of breath when moving around. She has a history of occasional lightheadedness, but not to this extent.  She reports having black stools for the past three weeks. She denies taking Pepto Bismol or iron supplements, which could cause black stools. She has a history of hemorrhoids, which have been improving, but she is concerned about the black stools being a sign of blood.  She has a rash under her stomach, which was previously blistered and has somewhat cleared up. She attributes the rash to being overweight and has been using nystatin cream regularly.  She mentions a history of diarrhea, which has improved after completing a course of antibiotics and prednisone. She also reports a history of cough, which has improved, but she still experiences  coughing when she gets hot or laughs.  She experiences neck and head pain, and her eyes burn despite using lubricating drops. She has a history of pulmonary issues and is currently not participating in pulmonary rehab exercises due to her current condition. She also mentions a history of stool incontinence, which is improving with treatment.    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   Diverticulitis    Dry eye syndrome of both eyes 08/13/2021   Dyspnea on exertion 08/13/2021   Family history of problems related to stress 11/15/2021   Frequency of urination and polyuria 06/25/2022   GAD (generalized anxiety disorder) 08/15/2021   Glaucoma    H/O urinary incontinence 06/25/2022   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   High risk medication use 08/02/2018   HLD (hyperlipidemia)    Iron deficiency 12/04/2019   Isolation (social) 12/07/2021  Kidney stones    Microscopic hematuria    Mixed incontinence    Morbid obesity (HCC)    Ocular hypertension, 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   Ocular migraine 08/02/2018   Osteoarthritis    lumbar spine   Osteoarthritis of right knee 08/28/2023   Palpitations 08/13/2021   Plantar fasciitis 06/25/2023   Post covid-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, 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   Postoperative follow-up 03/30/2020   Prediabetes 08/14/2015   Pseudophakia, both eyes 06/26/2016   Rectal bleeding  10/17/2021   Shortness of breath dyspnea    Sleep apnea    CPAP   Vaginal yeast infection    Wears dentures    partial upper   Past Surgical History:  Procedure Laterality Date   ABDOMINAL HYSTERECTOMY  09/08/1996   BACK SURGERY  1990/2014   CATARACT EXTRACTION, BILATERAL  09/08/2012   COLONOSCOPY WITH PROPOFOL N/A 03/25/2016   Procedure: COLONOSCOPY WITH PROPOFOL;  Surgeon: Midge Minium, MD;  Location: [facility] ENDOSCOPY;  Service: Endoscopy;  Laterality: N/A;   deviated nose septum surgery  09/08/1998   ESOPHAGOGASTRODUODENOSCOPY (EGD) WITH PROPOFOL N/A 03/25/2016   Procedure: ESOPHAGOGASTRODUODENOSCOPY (EGD) WITH PROPOFOL;  Surgeon: Midge Minium, MD;  Location: [facility] ENDOSCOPY;  Service: Endoscopy;  Laterality: N/A;   EYE SURGERY     LITHOTRIPSY  09/08/2010   TOTAL HIP ARTHROPLASTY Left    Social History   Socioeconomic History   Marital status: Divorced    Spouse name: Not on file   Number of children: 3   Years of education: Not on file   Highest education level: Some college, no degree  Occupational History   Occupation: retired  Tobacco Use   Smoking status: Never    Passive exposure: Never (Parent)   Smokeless tobacco: Never  Vaping Use   Vaping status: Never Used  Substance and Sexual Activity   Alcohol use: No   Drug use: No   Sexual activity: Never    Birth control/protection: Surgical  Other Topics Concern   Not on file  Social History Narrative   Not on file   Social Drivers of Health   Financial Resource Strain: Low Risk  (08/25/2023)   Overall Financial Resource Strain (CARDIA)    Difficulty of Paying Living Expenses: Not hard at all  Food Insecurity: No Food Insecurity (10/21/2023)   Hunger Vital Sign    Worried About Running Out of Food in the Last Year: Never true    Ran Out of Food in the Last Year: Never true  Transportation Needs: No Transportation Needs (10/21/2023)   PRAPARE - Administrator, Civil Service (Medical): No    Lack of  Transportation (Non-Medical): No  Physical Activity: Insufficiently Active (08/25/2023)   Exercise Vital Sign    Days of Exercise per Week: 1 day    Minutes of Exercise per Session: 10 min  Stress: No Stress Concern Present (08/25/2023)   [employer] of Occupational Health - Occupational Stress Questionnaire    Feeling of Stress : Only a little  Social Connections: Socially Isolated (08/25/2023)   Social Connection and Isolation Panel [NHANES]    Frequency of Communication with Friends and Family: More than three times a week    Frequency of Social Gatherings with Friends and Family: Three times a week    Attends Religious Services: Never    Active Member of Clubs or Organizations: No    Attends Banker Meetings: Never    Marital Status: Divorced  Catering manager Violence: Not At Risk (10/21/2023)   Humiliation, Afraid, Rape,  and Kick questionnaire    Fear of Current or Ex-Partner: No    Emotionally Abused: No    Physically Abused: No    Sexually Abused: No   Family Status  Relation Name Status   Mother  Deceased   Father  Deceased   Sister  Alive   Daughter  (Not Specified)   Neg Hx  (Not Specified)  No partnership data on file   Family History  Problem Relation Age of Onset   Depression Mother    Asthma Mother    Heart disease Mother    Diabetes Mother    Cancer Father        lung cancer   COPD Father    Liver cancer Father    Cancer Sister        Brain Cancer   Allergic rhinitis Sister    Asthma Sister    Depression Daughter    Hematuria Daughter    Bladder Cancer Neg Hx    Allergies  Allergen Reactions   Morphine Hives    Patient Care Team: Kelsye Loomer, Monico Blitz, DO as PCP - General (Family Medicine) Luciano Cutter, MD as Consulting Physician (Pulmonary Disease) Alfredo Martinez, MD as Consulting Physician (Urology) [facility], [facility] (Orthopedic Surgery) Shilt, Arnetha Massy, PA-C (Orthopedic Surgery) Birder Robson, MD as Consulting  Physician (Allergy and Immunology) Celso Amy, PA-C as Physician Assistant (Gastroenterology) Pa, [facility] (Optometry)   Medications: Outpatient Medications Prior to Visit  Medication Sig   albuterol (VENTOLIN HFA) 108 (90 Base) MCG/ACT inhaler Inhale 1-2 puffs into the lungs every 6 (six) hours as needed for wheezing or shortness of breath.   atorvastatin (LIPITOR) 20 MG tablet TAKE 1 TABLET(20 MG) BY MOUTH AT BEDTIME   Biotin 1 MG CAPS Take 1 capsule by mouth daily.   budesonide-formoterol (SYMBICORT) 160-4.5 MCG/ACT inhaler Inhale 2 puffs into the lungs 2 (two) times daily.   CALCIUM PO Take by mouth.   cetirizine (ZYRTEC) 10 MG tablet Take 1 tablet (10 mg total) by mouth at bedtime.   DULoxetine (CYMBALTA) 60 MG capsule Take 1 capsule (60 mg total) by mouth daily. Reported on 12/26/2015   fluticasone (FLONASE) 50 MCG/ACT nasal spray Place 2 sprays into both nostrils daily.   Gabapentin, Once-Daily, 300 MG TABS Take 0.5 tablets (150 mg total) by mouth at bedtime.   hydrOXYzine (ATARAX) 50 MG tablet Take by mouth.   ipratropium (ATROVENT) 0.03 % nasal spray Place 2 sprays into both nostrils every 12 (twelve) hours. Use in place of flonase (not both together)   ipratropium-albuterol (DUONEB) 0.5-2.5 (3) MG/3ML SOLN Take 3 mLs by nebulization every 4 (four) hours as needed.   levothyroxine (SYNTHROID) 75 MCG tablet Take 1 tablet (75 mcg total) by mouth daily before breakfast.   meloxicam (MOBIC) 15 MG tablet Take 1 tablet by mouth daily.   montelukast (SINGULAIR) 10 MG tablet Take 1 tablet (10 mg total) by mouth at bedtime.   nystatin (MYCOSTATIN/NYSTOP) powder Apply 1 Application topically 3 (three) times daily.   omeprazole (PRILOSEC) 20 MG capsule TAKE 1 CAPSULE(20 MG) BY MOUTH TWICE DAILY BEFORE A MEAL   predniSONE (DELTASONE) 20 MG tablet Take 60mg  PO daily x 2 days, then40mg  PO daily x 2 days, then 20mg  PO daily x 3 days (Patient not taking: Reported on 10/21/2023)    Probiotic Product (PROBIOTIC PO) Take by mouth.   topiramate (TOPAMAX) 50 MG tablet Take 1 tablet (50 mg total) by mouth daily.   trimethoprim (TRIMPEX) 100 MG  tablet Take 1 tablet (100 mg total) by mouth daily.   No facility-administered medications prior to visit.       Objective    BP 121/75   Pulse (!) 102   Resp (!) 22   Ht 5\' 1"  (1.549 m)   Wt 243 lb (110.2 kg)   SpO2 98%   BMI 45.91 kg/m    Physical Exam Constitutional:      Appearance: Normal appearance.  HENT:     Head: Normocephalic and atraumatic.  Eyes:     General: No scleral icterus.    Extraocular Movements: Extraocular movements intact.     Conjunctiva/sclera: Conjunctivae normal.  Cardiovascular:     Rate and Rhythm: Normal rate and regular rhythm.     Pulses: Normal pulses.     Heart sounds: Normal heart sounds.  Pulmonary:     Effort: Pulmonary effort is normal. No respiratory distress.     Breath sounds: Normal breath sounds.  Abdominal:     Comments: Stool visibly brown but heme-positive.  Musculoskeletal:     Right lower leg: No edema.     Left lower leg: No edema.  Skin:    General: Skin is warm and dry.     Comments: Tenting noted.  Neurological:     Mental Status: She is alert and oriented to person, place, and time. Mental status is at baseline.  Psychiatric:        Mood and Affect: Mood normal.        Behavior: Behavior normal.      Last depression screening scores    10/23/2023    1:14 PM 10/21/2023    3:23 PM 10/21/2023    3:11 PM  PHQ 2/9 Scores  PHQ - 2 Score 6 0 0  PHQ- 9 Score 16 0 0   Last fall risk screening    10/23/2023    1:15 PM  Fall Risk   Falls in the past year? 0  Number falls in past yr: 0  Injury with Fall? 0   Last Audit-C alcohol use screening    08/25/2023    4:10 PM  Alcohol Use Disorder Test (AUDIT)  1. How often do you have a drink containing alcohol? 0  2. How many drinks containing alcohol do you have on a typical day when you are drinking?  0  3. How often do you have six or more drinks on one occasion? 0  AUDIT-C Score 0   A score of 3 or more in women, and 4 or more in men indicates increased risk for alcohol abuse, EXCEPT if all of the points are from question 1   No results found for any visits on 10/23/23.  Assessment & Plan    Routine Health Maintenance and Physical Exam  Exercise Activities and Dietary recommendations  Goals       Patient Stated (pt-stated)      Complete pulmonary rehab        Immunization History  Administered Date(s) Administered   Fluad Trivalent(High Dose 65+) 07/20/2023   Fluzone Influenza virus vaccine,trivalent (IIV3), split virus 10/03/2012   Influenza, Mdck, Trivalent,PF 6+ MOS(egg free) 09/07/2013   Influenza, Seasonal, Injecte, Preservative Fre 08/22/2014, 05/14/2021   Influenza,inj,Quad PF,6+ Mos 10/03/2012, 08/14/2015   Influenza-Unspecified 10/03/2012, 09/07/2013, 08/22/2014, 08/14/2015   Moderna Sars-Covid-2 Vaccination 02/17/2020, 03/16/2020   Pneumococcal Polysaccharide-23 09/09/2011, 10/03/2012, 05/14/2021   Tdap 09/08/2004, 08/21/2011, 02/22/2021    Health Maintenance  Topic Date Due   DEXA SCAN  Never done  Pneumonia Vaccine 26+ Years old (2 of 2 - PCV) 11/02/2023 (Originally 05/14/2022)   Zoster Vaccines- Shingrix (1 of 2) 11/02/2023 (Originally 09/04/1969)   COVID-19 Vaccine (3 - Moderna risk series) 05/09/2024 (Originally 04/13/2020)   MAMMOGRAM  10/29/2024 (Originally 01/03/2017)   Medicare Annual Wellness (AWV)  08/25/2024   Colonoscopy  07/19/2026   DTaP/Tdap/Td (4 - Td or Tdap) 02/23/2031   INFLUENZA VACCINE  Completed   Hepatitis C Screening  Completed   HPV VACCINES  Aged Out    Discussed health benefits of physical activity, and encouraged her to engage in regular exercise appropriate for her age and condition.   Dehydration Assessment & Plan: Lightheadedness for the past week, worsening over the last three days. Blood pressure is normal (121/75),  oxygen levels are WNL, and hemoglobin is elevated (15). Suspected dehydration due to insufficient fluid intake. Differential diagnosis includes anemia, but hemoglobin levels are high. Discussed the importance of hydration and electrolytes in preventing dehydration. Rehydration should improve symptoms within a couple of days if fluid intake is adequate. - Increase fluid intake to at least 64 ounces per day - Use electrolyte solutions such as Pedialyte, IV hydration, Gatorade Zero, or Powerade Zero - Sampled of IV hydration given for patient to start with until she is able obtain additional electrolyte solutions. She plans to obtain these immediately after her visit. - Monitor for improvement in lightheadedness and report any worsening symptoms   Dizziness Assessment & Plan: Addressed as noted above.   Black stool Assessment & Plan: Black stool for the past three weeks. No use of Pepto Bismol or iron supplements. Hemoglobin levels are elevated, which is unusual if there is significant blood loss. Stool test positive for blood. Differential diagnosis includes gastrointestinal bleeding, possibly from hemorrhoids or another source. Discussed the need for further evaluation by gastroenterology and potential colonoscopy to identify the source of bleeding. - Refer to gastroenterology for further evaluation and possible colonoscopy - Monitor stool color and consistency  Orders: -     Ambulatory referral to Gastroenterology  Heme positive stool -     Ambulatory referral to Gastroenterology  Candidal intertrigo Assessment & Plan: Rash under the abdomen, previously blistered and severe, now improving. Likely due to intertrigo exacerbated by obesity. Patient has been using nystatin without sufficient improvement. - Prescribe an Lotrisone topical treatment  Orders: -     Clotrimazole-Betamethasone; Apply 1 Application topically 2 (two) times daily.  Dispense: 30 g; Refill: 0  Encounter for  osteoporosis screening in asymptomatic postmenopausal patient  Mammogram declined  General Health Maintenance Patient is due for shingles and pneumonia vaccines. Last mammogram was postponed due to hip surgery. Declined mammogram referral at this time but agreed to bone density screening. - Administer shingles and pneumonia vaccines when current illness has resolved - Refer for bone density screening (previously ordered; pt to call and schedule) - Discuss mammogram scheduling at a later date  Follow-up - Schedule follow-up appointment in 1-2 weeks - Advise patient to go to urgent care or ER for IV fluids if unable to maintain adequate hydration - Monitor for worsening symptoms such as increased cough or shortness of breath.   CPE not performed today due to patient's acute illness demanding more urgent attention.  Return in about 1 week (around 10/30/2023) for CPE, recheck.     I discussed the assessment and treatment plan with the patient  The patient was provided an opportunity to ask questions and all were answered. The patient agreed with the plan and  demonstrated an understanding of the instructions.   The patient was advised to call back or seek an in-person evaluation if the symptoms worsen or if the condition fails to improve as anticipated.    Sherlyn Hay, DO  [facility] [phone] (phone) [phone] (fax)  [facility]

## 2023-10-25 ENCOUNTER — Encounter: Payer: Self-pay | Admitting: Family Medicine

## 2023-10-30 ENCOUNTER — Ambulatory Visit: Payer: Self-pay | Admitting: Professional Counselor

## 2023-10-30 ENCOUNTER — Encounter: Payer: Self-pay | Admitting: Family Medicine

## 2023-10-30 DIAGNOSIS — B372 Candidiasis of skin and nail: Secondary | ICD-10-CM | POA: Insufficient documentation

## 2023-10-30 MED ORDER — CLOTRIMAZOLE-BETAMETHASONE 1-0.05 % EX CREA: 1.0000 | TOPICAL_CREAM | Freq: Two times a day (BID) | CUTANEOUS | 0 refills | Status: AC

## 2023-10-30 NOTE — Assessment & Plan Note (Signed)
 Black stool for the past three weeks. No use of Pepto Bismol or iron supplements. Hemoglobin levels are elevated, which is unusual if there is significant blood loss. Stool test positive for blood. Differential diagnosis includes gastrointestinal bleeding, possibly from hemorrhoids or another source. Discussed the need for further evaluation by gastroenterology and potential colonoscopy to identify the source of bleeding. - Refer to gastroenterology for further evaluation and possible colonoscopy - Monitor stool color and consistency

## 2023-10-30 NOTE — Assessment & Plan Note (Signed)
 Addressed as noted above.

## 2023-10-30 NOTE — Assessment & Plan Note (Signed)
 Rash under the abdomen, previously blistered and severe, now improving. Likely due to intertrigo exacerbated by obesity. Patient has been using nystatin without sufficient improvement. - Prescribe an Lotrisone topical treatment

## 2023-10-30 NOTE — Assessment & Plan Note (Addendum)
 Lightheadedness for the past week, worsening over the last three days. Blood pressure is normal (121/75), oxygen levels are WNL, and hemoglobin is elevated (15). Suspected dehydration due to insufficient fluid intake. Differential diagnosis includes anemia, but hemoglobin levels are high. Discussed the importance of hydration and electrolytes in preventing dehydration. Rehydration should improve symptoms within a couple of days if fluid intake is adequate. - Increase fluid intake to at least 64 ounces per day - Use electrolyte solutions such as Pedialyte, IV hydration, Gatorade Zero, or Powerade Zero - Sampled of IV hydration given for patient to start with until she is able obtain additional electrolyte solutions. She plans to obtain these immediately after her visit. - Monitor for improvement in lightheadedness and report any worsening symptoms

## 2023-11-02 ENCOUNTER — Encounter: Payer: 59 | Admitting: *Deleted

## 2023-11-02 DIAGNOSIS — J8410 Pulmonary fibrosis, unspecified: Secondary | ICD-10-CM

## 2023-11-02 DIAGNOSIS — R0600 Dyspnea, unspecified: Secondary | ICD-10-CM

## 2023-11-02 NOTE — Progress Notes (Signed)
 Daily Session Note  Patient Details  Name: Faith Padilla MRN: [id number] Date of Birth: Jun 16, 1951 Referring Provider:   Flowsheet Row Pulmonary Rehab from 08/25/2023 in [facility]  Referring Provider Dr. Luciano Cutter       Encounter Date: 11/02/2023  Check In:  Session Check In - 11/02/23 1400       Check-In   Supervising physician immediately available to respond to emergencies See telemetry face sheet for immediately available ER MD    Location [facility]-Cardiac & Pulmonary Rehab    Staff Present Susann Givens RN,BSN;[facility] BS, Exercise Physiologist;Noah Tickle, BS, Exercise Physiologist    Virtual Visit No    Medication changes reported     No    Fall or balance concerns reported    No    Warm-up and Cool-down Performed on first and last piece of equipment    Resistance Training Performed Yes    VAD Patient? No    PAD/SET Patient? No      Pain Assessment   Currently in Pain? No/denies                Social History   Tobacco Use  Smoking Status Never   Passive exposure: Never (Parent)  Smokeless Tobacco Never    Goals Met:  Independence with exercise equipment Exercise tolerated well No report of concerns or symptoms today Strength training completed today  Goals Unmet:  Not Applicable  Comments: Pt able to follow exercise prescription today without complaint.  Will continue to monitor for progression.    Dr. Bethann Punches is Medical Director for [facility].  Dr. Vida Rigger is Medical Director for [facility] Rehabilitation.

## 2023-11-03 ENCOUNTER — Ambulatory Visit (HOSPITAL_BASED_OUTPATIENT_CLINIC_OR_DEPARTMENT_OTHER): Payer: 59 | Admitting: Pulmonary Disease

## 2023-11-03 ENCOUNTER — Encounter (HOSPITAL_BASED_OUTPATIENT_CLINIC_OR_DEPARTMENT_OTHER): Payer: Self-pay | Admitting: Pulmonary Disease

## 2023-11-03 VITALS — BP 120/78 | HR 82 | Ht 62.0 in | Wt 241.2 lb

## 2023-11-03 DIAGNOSIS — J9611 Chronic respiratory failure with hypoxia: Secondary | ICD-10-CM | POA: Diagnosis not present

## 2023-11-03 DIAGNOSIS — J8410 Pulmonary fibrosis, unspecified: Secondary | ICD-10-CM

## 2023-11-03 DIAGNOSIS — R053 Chronic cough: Secondary | ICD-10-CM

## 2023-11-03 LAB — PULMONARY FUNCTION TEST
DL/VA % pred: 117 %
DL/VA: 4.93 ml/min/mmHg/L
DLCO cor % pred: 107 %
DLCO cor: 19.33 ml/min/mmHg
DLCO unc % pred: 112 %
DLCO unc: 20.22 ml/min/mmHg
FEF 25-75 Post: 2.84 L/s
FEF 25-75 Pre: 2.21 L/s
FEF2575-%Change-Post: 28 %
FEF2575-%Pred-Post: 172 %
FEF2575-%Pred-Pre: 134 %
FEV1-%Change-Post: 4 %
FEV1-%Pred-Post: 102 %
FEV1-%Pred-Pre: 98 %
FEV1-Post: 2.05 L
FEV1-Pre: 1.95 L
FEV1FVC-%Change-Post: 0 %
FEV1FVC-%Pred-Pre: 113 %
FEV6-%Change-Post: 4 %
FEV6-%Pred-Post: 94 %
FEV6-%Pred-Pre: 90 %
FEV6-Post: 2.37 L
FEV6-Pre: 2.27 L
FEV6FVC-%Pred-Post: 105 %
FEV6FVC-%Pred-Pre: 105 %
FVC-%Change-Post: 4 %
FVC-%Pred-Post: 89 %
FVC-%Pred-Pre: 85 %
FVC-Post: 2.37 L
FVC-Pre: 2.27 L
Post FEV1/FVC ratio: 86 %
Post FEV6/FVC ratio: 100 %
Pre FEV1/FVC ratio: 86 %
Pre FEV6/FVC Ratio: 100 %
RV % pred: 75 %
RV: 1.63 L
TLC % pred: 96 %
TLC: 4.58 L

## 2023-11-03 NOTE — Patient Instructions (Signed)
 Full PFT Performed Today

## 2023-11-03 NOTE — Progress Notes (Signed)
 Subjective:   PATIENT ID: Faith Padilla GENDER: female DOB: Apr 09, 1951, MRN: [id number]  Chief Complaint  Patient presents with   Follow-up    Lung Fibrosis    Reason for Visit: F/u pulmonary fibrosis  Faith Padilla is a 73 year old female never smoker with OSA on CPAP, bronchiectasis, HTN, HLD, DM2, hypothyroidism, GERD who presents for follow-up pulmonary fibrosis.  Initial consult She has a documented history of pulmonary fibrosis with ?related to post-covid. However no recent dedicated lung imaging available. CT A/P from 04/05/22 comments on small areas of parenchymal scarring at the lung base on report. Her recent spirometry in October demonstrates no obstructive defect but no lung volumes or DLCO available.  She was hospitalized in December 2020 with covid for three weeks. Discharged on oxygen which was discontinued after 3 months. Previously on nebulizers (brovana, atrovent) but no longer needs it. She was followed by Dr. Carlynn Purl in Pulmonary clinic in [place]. Reports prior PFTs. Does not currently need maintenance inhalers, previously on Spiriva and Breztri at different times. Takes montelukast and cetrizine. Reports occasional coughing from allergies. 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   She has OSA and wear CPAP nightly. Reports history of failing extubation post-lithotripsy before covid  Addendum: Faxed records sent - [facility] 12/24/22 -Seen for Bronchiectasis, chronic respiratoyr failure, OSA (AHI 10.8/h), post-inflammatory pulmonary fibrosis -PFTs 12/22/22 FVC 2.27 (84%) FEV1 1.97 (94%) Ratio 87  TLC 4.32 (88%) DLCO 88% Interpretation: Normal PFTs -On ventolin PRN. No comment on active oxygen needs. Compliant with CPAP 8-16 cm H20 -Last bronchiectasis exacerbation in 02/2022. Tried Breztri sample, prednisone, doxycycline. Prior exacerbations in 09/2021, 08/2021, 02/2021 as well.  -Documented fungal infection of  lung (no organism noted on records) treated with itraconazole for from 03/2021-07/2021. Bronchoscopy at [facility] 03/06/21 for cough. Report commented on dynamic collapse throughout tracheobronchial tree. Results on the noted with neg cultures including neg fungal but yeast (not cryptococcus species. "Neg for malignancy. Reactive bronchial epithelial cells admixed with alveolar macrophages and squamous cells. Mucoinflammatory debris with acute inflammatory cells" Unclear if itraconazole helped based on note from 07/22/21.  11/03/23 Since our last visit she has been participating in pulmonary rehab. She has also received her oxygen in December. Oxygen has improved and not needing it while exercising. Completed PFTs today. She had bronchitis two weeks ago and was treated with steroids and Symbicort. Denies fevers, chills.  Stopped taking Symbicort. Has been coughing with deep breaths and exercise.  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   Diverticulitis    Dry eye syndrome of both eyes 08/13/2021   Dyspnea on exertion 08/13/2021   Family history of problems related to stress 11/15/2021   Frequency of urination and polyuria 06/25/2022   GAD (generalized anxiety disorder) 08/15/2021   Glaucoma  H/O urinary incontinence 06/25/2022   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    High risk medication use 08/02/2018   HLD (hyperlipidemia)    Iron deficiency 12/04/2019   Isolation (social) 12/07/2021   Kidney stones    Microscopic hematuria    Mixed incontinence    Morbid obesity (HCC)    Ocular hypertension, 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   Ocular migraine 08/02/2018   Osteoarthritis    lumbar spine   Osteoarthritis of right knee 08/28/2023   Palpitations 08/13/2021   Plantar fasciitis 06/25/2023   Post covid-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, 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   Postoperative follow-up 03/30/2020   Prediabetes 08/14/2015   Pseudophakia, both eyes 06/26/2016   Rectal bleeding 10/17/2021   Shortness of breath dyspnea    Sleep apnea    CPAP   Vaginal yeast infection    Wears dentures    partial upper     Family History  Problem Relation Age of Onset   Depression Mother    Asthma Mother    Heart disease Mother    Diabetes Mother    Cancer Father        lung cancer   COPD Father    Liver cancer Father    Cancer Sister        Brain Cancer   Allergic rhinitis Sister    Asthma Sister    Depression Daughter    Hematuria Daughter    Bladder Cancer Neg Hx      Social History   Occupational History   Occupation: retired  Tobacco Use   Smoking status: Never    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 18 g 1   atorvastatin (LIPITOR) 20 MG tablet TAKE 1 TABLET(20 MG) BY MOUTH AT BEDTIME 90 tablet 0   Biotin 1 MG CAPS Take 1 capsule by mouth daily.     budesonide-formoterol (SYMBICORT) 160-4.5  MCG/ACT inhaler Inhale 2 puffs into the lungs 2 (two) times daily. 1 each 3   CALCIUM PO Take by mouth.     cetirizine (ZYRTEC) 10 MG tablet Take 1 tablet (10 mg total) by mouth at bedtime. 30 tablet 3   clotrimazole-betamethasone (LOTRISONE) cream Apply 1 Application topically 2 (two) times daily. 30 g 0   DULoxetine (CYMBALTA) 60 MG capsule Take 1 capsule (60 mg total) by mouth daily. Reported on 12/26/2015 90 capsule 3   fluticasone (FLONASE) 50 MCG/ACT nasal spray Place 2 sprays into both nostrils daily. 16 g 5   Gabapentin, Once-Daily, 300 MG TABS Take 0.5 tablets (150 mg total) by mouth at bedtime. 45 tablet 1   hydrOXYzine (ATARAX) 50 MG tablet Take by mouth.     ipratropium (ATROVENT) 0.03 % nasal spray Place 2 sprays into both nostrils every 12 (twelve) hours. Use in place of flonase (not both together) 30 mL 0   ipratropium-albuterol (DUONEB) 0.5-2.5 (3) MG/3ML SOLN Take 3 mLs by nebulization every 4 (four) hours as needed. 360 mL 3   levothyroxine (SYNTHROID) 75 MCG tablet Take 1 tablet (  75 mcg total) by mouth daily before breakfast. 90 tablet 3   meloxicam (MOBIC) 15 MG tablet Take 1 tablet by mouth daily.     montelukast (SINGULAIR) 10 MG tablet Take 1 tablet (10 mg total) by mouth at bedtime. 30 tablet 5   nystatin (MYCOSTATIN/NYSTOP) powder Apply 1 Application topically 3 (three) times daily. 60 g 3   omeprazole (PRILOSEC) 20 MG capsule TAKE 1 CAPSULE(20 MG) BY MOUTH TWICE DAILY BEFORE A MEAL 180 capsule 0   predniSONE (DELTASONE) 20 MG tablet Take 60mg  PO daily x 2 days, then40mg  PO daily x 2 days, then 20mg  PO daily x 3 days 13 tablet 0   Probiotic Product (PROBIOTIC PO) Take by mouth.     trimethoprim (TRIMPEX) 100 MG tablet Take 1 tablet (100 mg total) by mouth daily. 30 tablet 11   topiramate (TOPAMAX) 50 MG tablet Take 1 tablet (50 mg total) by mouth daily. (Patient not taking: Reported on 11/03/2023) 90 tablet 1   No facility-administered medications prior to visit.     Review of Systems  Constitutional:  Negative for chills, diaphoresis, fever, malaise/fatigue and weight loss.  HENT:  Negative for congestion.   Respiratory:  Positive for cough and shortness of breath. Negative for hemoptysis, sputum production and wheezing.   Cardiovascular:  Negative for chest pain, palpitations and leg swelling.     Objective:   Vitals:   11/03/23 1439  BP: 120/78  Pulse: 82  SpO2: 96%  Weight: 241 lb 3.2 oz (109.4 kg)  Height: 5\' 2"  (1.575 m)   SpO2: 96 %  Physical Exam: General: Well-appearing, no acute distress HENT: [place], AT Eyes: EOMI, no scleral icterus Respiratory: Clear to auscultation bilaterally.  No crackles, wheezing or rales Cardiovascular: RRR, -M/R/G, no JVD Extremities:-Edema,-tenderness Neuro: AAO x4, CNII-XII grossly intact Psych: Normal mood, normal affect  Data Reviewed:  Imaging: No recent dedicated lung imaging  CXR 12/12/20 (report only from [facility]) Reticulonodular opacities of the lungs, unchanged from comparison CXR compatible with hx of prior COVID. Hiatal hernia  CT Chest 02/19/21 (report only from [facility]) Pertinent findings: Patchy areas of ground glass, septal thickening, thickening of the peribronchovascular interstitium, regional  architectural distortion and volume loss are again noted scattered throughout the lungs bilaterally with no discernible craniocadual gradient. Findings are essentially unchanged compared to prior study from 07/31/20. No honeycombing is identified. Inspiratory and expiratory imaging demonstrates some mild air trapping indicative of mild small airways disease. No acute consolidative airspace disease. No pleural effusions. No definite suspicious nodules or masses. 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  Rounded opacity at the lung base likely reflects hiatal hernia. Although less likely, diaphragmatic hernia is not completed excluded. Conclusion: Bilateral pneumonia  CT Chest 08/18/23 - Stable post-inflammatory fibrosis  PFT:  12/22/22 [facility] FVC 2.39 (89%) FEV1 2.02 (94%) Ratio 87  TLC 88% DLCO 88% Interpretation: Normal  07/08/23 FVC 2.44 (98%) FEV1 1.89 (98%) Ratio 77   Interpretation: Normal spirometry  11/03/23 FVC 2.37 (89%) FEV1 2.05 (102%) Ratio 86  TLC 96% DLCO 112% Interpretation: Norma pulmonary function tests   Labs:    Latest Ref Rng & Units 10/16/2023    9:16 AM 07/20/2023   10:51 AM 05/07/2023    4:13 PM  CBC  WBC 3.4 - 10.8 x10E3/uL 11.3  6.1  12.7   Hemoglobin  11.1 - 15.9 g/dL 16.1  09.6  04.5   Hematocrit 34.0 - 46.6 % 46.4  40.0  39.0   Platelets 150 - 450 x10E3/uL 429  364  576    Ambulatory O2 08/11/23 Jama Flavors, CMA Tue Aug 11, 2023  3:37 PM   O2 on room air: 94% O2 on ambulation room air: 79% O2 on ambulation with 2 L Oxygen: 98% Patient qualified for oxygen in office due to desat on exertion.    Assessment & Plan:   Discussion: 73 year old female never smoker with OSA on CPAP, bronchiectasis, HTN, HLD, DM2, hypothyroidism, GERD who presents for follow-up pulmonary fibrosis. Prior ambulatory O2 with desaturations but reportedly improved with pulmonary rehab. Reviewed CT with stable fibrosis and PFTs demonstrating normal results which is suggestive of improving post-viral/covid inflammation. Still has some cough that could be multifactorial. Encouraged to restart bronchodilators to see if this improves symptoms.  Post-covid pulmonary fibrosis Chronic hypoxemic respiratory failure --Reviewed CT Chest without contrast. Stable fibrosis --Reviewed pulmonary function test. Normal --Continue oxygen for goal >88%  --Recommend 2L via [place] with continuous POC with activity --Continue pulmonary rehab. [facility]   Chronic cough Common causes of cough were discussed including upper airway cough syndrome, reflux and lung disease.  --RESTART Symbicort TWO puffs TWICE a day --Allergies: CONTINUE cetrizine 10 mg once a day. Buy over-the counter --Reflux: CONTINUE omeprazole 40 mg once a day. Buy over-the-counter  Health Maintenance Immunization History  Administered Date(s) Administered   Fluad Trivalent(High Dose 65+) 07/20/2023   Fluzone Influenza virus vaccine,trivalent (IIV3), split virus 10/03/2012   Influenza, Mdck, Trivalent,PF 6+ MOS(egg free) 09/07/2013   Influenza, Seasonal, Injecte, Preservative Fre 08/22/2014, 05/14/2021   Influenza,inj,Quad PF,6+ Mos 10/03/2012, 08/14/2015   Influenza-Unspecified 10/03/2012, 09/07/2013, 08/22/2014, 08/14/2015   Moderna Sars-Covid-2 Vaccination 02/17/2020, 03/16/2020   Pneumococcal Polysaccharide-23 09/09/2011, 10/03/2012, 05/14/2021   Tdap 09/08/2004, 08/21/2011, 02/22/2021   CT Lung Screen - not qualified  No orders of the defined types were placed in this encounter. No orders of the defined types were placed in this encounter.   Return in about 8 months (around 07/02/2024).  I have spent a total time of 36-minutes on the day of the appointment including chart review, data review, collecting history, coordinating care and discussing medical diagnosis and plan with the patient/family. Past medical history, allergies, medications were reviewed. Pertinent imaging, labs and tests included in this note have been reviewed and interpreted independently by me.  Ulani Degrasse Mechele Collin, MD [facility] 11/03/2023 2:52 PM

## 2023-11-03 NOTE — Progress Notes (Signed)
 Full PFT Performed Today

## 2023-11-03 NOTE — Patient Instructions (Addendum)
 Post-covid pulmonary fibrosis Chronic hypoxemic respiratory failure --Reviewed CT Chest without contrast. Stable fibrosis --Reviewed pulmonary function test. Normal --Continue oxygen for goal >88%  --Recommend 2L via  with continuous POC with activity --Continue pulmonary rehab. [facility]   Chronic cough Common causes of cough were discussed including upper airway cough syndrome, reflux and lung disease.  --RESTART Symbicort TWO puffs TWICE a day --Allergies: CONTINUE cetrizine 10 mg once a day. Buy over-the counter --Reflux: CONTINUE omeprazole 40 mg once a day. Buy over-the-counter

## 2023-11-04 ENCOUNTER — Encounter: Payer: 59 | Admitting: *Deleted

## 2023-11-04 DIAGNOSIS — R0600 Dyspnea, unspecified: Secondary | ICD-10-CM

## 2023-11-04 DIAGNOSIS — J8410 Pulmonary fibrosis, unspecified: Secondary | ICD-10-CM | POA: Diagnosis not present

## 2023-11-04 NOTE — Progress Notes (Signed)
 Daily Session Note  Patient Details  Name: Faith Padilla MRN: [id number] Date of Birth: 30-Mar-1951 Referring Provider:   Flowsheet Row Pulmonary Rehab from 08/25/2023 in [facility] Cardiac and Pulmonary Rehab  Referring Provider Dr. Luciano Cutter       Encounter Date: 11/04/2023  Check In:  Session Check In - 11/04/23 1336       Check-In   Supervising physician immediately available to respond to emergencies See telemetry face sheet for immediately available ER MD    Location [facility]-Cardiac & Pulmonary Rehab    Staff Present Susann Givens RN,BSN;Margaret Best, MS, Exercise Physiologist;Maxon Conetta BS, Exercise Physiologist;Noah Tickle, BS, Exercise Physiologist    Virtual Visit No    Medication changes reported     No    Fall or balance concerns reported    No    Warm-up and Cool-down Performed on first and last piece of equipment    Resistance Training Performed Yes    VAD Patient? No    PAD/SET Patient? No      Pain Assessment   Currently in Pain? No/denies                Social History   Tobacco Use  Smoking Status Never   Passive exposure: Never (Parent)  Smokeless Tobacco Never    Goals Met:  Independence with exercise equipment Exercise tolerated well No report of concerns or symptoms today Strength training completed today  Goals Unmet:  Not Applicable  Comments: Pt able to follow exercise prescription today without complaint.  Will continue to monitor for progression.    Dr. Bethann Punches is Medical Director for [facility] Cardiac Rehabilitation.  Dr. Vida Rigger is Medical Director for [facility] Pulmonary Rehabilitation.

## 2023-11-05 ENCOUNTER — Encounter: Payer: 59 | Admitting: *Deleted

## 2023-11-05 DIAGNOSIS — J8410 Pulmonary fibrosis, unspecified: Secondary | ICD-10-CM | POA: Diagnosis not present

## 2023-11-05 DIAGNOSIS — R0600 Dyspnea, unspecified: Secondary | ICD-10-CM

## 2023-11-05 NOTE — Progress Notes (Signed)
 Daily Session Note  Patient Details  Name: Faith Padilla MRN: [id number] Date of Birth: 10-24-1950 Referring Provider:   Flowsheet Row Pulmonary Rehab from 08/25/2023 in [facility] Cardiac and Pulmonary Rehab  Referring Provider Dr. Luciano Cutter       Encounter Date: 11/05/2023  Check In:  Session Check In - 11/05/23 1408       Check-In   Supervising physician immediately available to respond to emergencies See telemetry face sheet for immediately available ER MD    Location [facility]-Cardiac & Pulmonary Rehab    Staff Present Susann Givens RN,BSN;Joseph Weyman Pedro, [place], Exercise Physiologist    Virtual Visit No    Medication changes reported     No    Fall or balance concerns reported    No    Warm-up and Cool-down Performed on first and last piece of equipment    Resistance Training Performed Yes    VAD Patient? No    PAD/SET Patient? No      Pain Assessment   Currently in Pain? No/denies                Social History   Tobacco Use  Smoking Status Never   Passive exposure: Never (Parent)  Smokeless Tobacco Never    Goals Met:  Independence with exercise equipment Exercise tolerated well No report of concerns or symptoms today Strength training completed today  Goals Unmet:  Not Applicable  Comments: Pt able to follow exercise prescription today without complaint.  Will continue to monitor for progression.    Dr. Bethann Punches is Medical Director for [facility] Cardiac Rehabilitation.  Dr. Vida Rigger is Medical Director for [facility] Pulmonary Rehabilitation.

## 2023-11-09 ENCOUNTER — Encounter: Payer: 59 | Attending: Pulmonary Disease | Admitting: *Deleted

## 2023-11-09 DIAGNOSIS — R0600 Dyspnea, unspecified: Secondary | ICD-10-CM | POA: Diagnosis present

## 2023-11-09 DIAGNOSIS — J8410 Pulmonary fibrosis, unspecified: Secondary | ICD-10-CM | POA: Diagnosis present

## 2023-11-09 NOTE — Progress Notes (Signed)
 Daily Session Note  Patient Details  Name: Faith Padilla MRN: [id number] Date of Birth: 19-Apr-1951 Referring Provider:   Flowsheet Row Pulmonary Rehab from 08/25/2023 in [facility] Cardiac and Pulmonary Rehab  Referring Provider Dr. Luciano Cutter       Encounter Date: 11/09/2023  Check In:  Session Check In - 11/09/23 1415       Check-In   Supervising physician immediately available to respond to emergencies See telemetry face sheet for immediately available ER MD    Location [facility]-Cardiac & Pulmonary Rehab    Staff Present Susann Givens RN,BSN;Joseph Hollace Kinnier;Betsy Coder PhD, RN,CNS,CEN;Noah Tickle, BS, Exercise Physiologist    Virtual Visit No    Medication changes reported     No    Fall or balance concerns reported    No    Warm-up and Cool-down Performed on first and last piece of equipment    Resistance Training Performed Yes    VAD Patient? No      Pain Assessment   Currently in Pain? No/denies                Social History   Tobacco Use  Smoking Status Never   Passive exposure: Never (Parent)  Smokeless Tobacco Never    Goals Met:  Independence with exercise equipment Exercise tolerated well No report of concerns or symptoms today Strength training completed today  Goals Unmet:  Not Applicable  Comments: Pt able to follow exercise prescription today without complaint.  Will continue to monitor for progression.    Dr. Bethann Punches is Medical Director for [facility] Cardiac Rehabilitation.  Dr. Vida Rigger is Medical Director for [facility] Pulmonary Rehabilitation.

## 2023-11-11 ENCOUNTER — Encounter: Payer: 59 | Admitting: *Deleted

## 2023-11-11 DIAGNOSIS — J8410 Pulmonary fibrosis, unspecified: Secondary | ICD-10-CM | POA: Diagnosis not present

## 2023-11-11 DIAGNOSIS — R0600 Dyspnea, unspecified: Secondary | ICD-10-CM

## 2023-11-11 NOTE — Progress Notes (Signed)
 Daily Session Note  Patient Details  Name: Faith Padilla MRN: [id number] Date of Birth: 05/09/1951 Referring Provider:   Flowsheet Row Pulmonary Rehab from 08/25/2023 in [facility] Cardiac and Pulmonary Rehab  Referring Provider Dr. Luciano Cutter       Encounter Date: 11/11/2023  Check In:  Session Check In - 11/11/23 1356       Check-In   Supervising physician immediately available to respond to emergencies See telemetry face sheet for immediately available ER MD    Location [facility]-Cardiac & Pulmonary Rehab    Staff Present Susann Givens RN,BSN;Noah Tickle, BS, Exercise Physiologist;Kelly Cloretta Ned, ACSM CEP, Exercise Physiologist    Virtual Visit No    Medication changes reported     No    Fall or balance concerns reported    No    Warm-up and Cool-down Performed on first and last piece of equipment    Resistance Training Performed Yes    VAD Patient? No    PAD/SET Patient? No      Pain Assessment   Currently in Pain? No/denies                Social History   Tobacco Use  Smoking Status Never   Passive exposure: Never (Parent)  Smokeless Tobacco Never    Goals Met:  Independence with exercise equipment Exercise tolerated well No report of concerns or symptoms today Strength training completed today  Goals Unmet:  Not Applicable  Comments: Pt able to follow exercise prescription today without complaint.  Will continue to monitor for progression.    Dr. Bethann Punches is Medical Director for [facility] Cardiac Rehabilitation.  Dr. Vida Rigger is Medical Director for [facility] Pulmonary Rehabilitation.

## 2023-11-12 ENCOUNTER — Telehealth: Payer: Self-pay

## 2023-11-12 ENCOUNTER — Encounter: Payer: 59 | Admitting: *Deleted

## 2023-11-12 ENCOUNTER — Telehealth: Payer: Self-pay | Admitting: Physician Assistant

## 2023-11-12 DIAGNOSIS — J8410 Pulmonary fibrosis, unspecified: Secondary | ICD-10-CM | POA: Diagnosis not present

## 2023-11-12 NOTE — Progress Notes (Signed)
 Daily Session Note  Patient Details  Name: Faith Padilla MRN: [id number] Date of Birth: 1951/04/01 Referring Provider:   Flowsheet Row Pulmonary Rehab from 08/25/2023 in [facility] Cardiac and Pulmonary Rehab  Referring Provider Dr. Luciano Cutter       Encounter Date: 11/12/2023  Check In:  Session Check In - 11/12/23 1410       Check-In   Supervising physician immediately available to respond to emergencies See telemetry face sheet for immediately available ER MD    Location [facility]-Cardiac & Pulmonary Rehab    Staff Present Susann Givens RN,BSN;Joseph [facility] RCP,RRT,BSRT;Margaret Best, MS, Exercise Physiologist;Noah Tickle, BS, Exercise Physiologist    Virtual Visit No    Medication changes reported     No    Fall or balance concerns reported    No    Warm-up and Cool-down Performed on first and last piece of equipment    Resistance Training Performed Yes    VAD Patient? No    PAD/SET Patient? No      Pain Assessment   Currently in Pain? No/denies                Social History   Tobacco Use  Smoking Status Never   Passive exposure: Never (Parent)  Smokeless Tobacco Never    Goals Met:  Independence with exercise equipment Exercise tolerated well No report of concerns or symptoms today Strength training completed today  Goals Unmet:  Not Applicable  Comments: Pt able to follow exercise prescription today without complaint.  Will continue to monitor for progression.    Dr. Bethann Punches is Medical Director for [facility] Cardiac Rehabilitation.  Dr. Vida Rigger is Medical Director for [facility] Pulmonary Rehabilitation.

## 2023-11-12 NOTE — Telephone Encounter (Signed)
 Tried calling patient again to schedule colonoscopy -no answer.

## 2023-11-12 NOTE — Telephone Encounter (Signed)
  Left message for patient to return call to office so we can schedule Colonoscopy.

## 2023-11-12 NOTE — Telephone Encounter (Signed)
 The patient called back.

## 2023-11-12 NOTE — Telephone Encounter (Signed)
 The patient called in to schedule her colonoscopy.

## 2023-11-12 NOTE — Telephone Encounter (Signed)
 Patient was seen in office by Inetta Fermo 06/30/23. Call to schedule colonoscopy.

## 2023-11-13 NOTE — Telephone Encounter (Signed)
 Left message for patient to return call to office so we can schedule colonoscopy.

## 2023-11-16 ENCOUNTER — Telehealth: Payer: Self-pay

## 2023-11-16 ENCOUNTER — Encounter: Payer: 59 | Admitting: *Deleted

## 2023-11-16 ENCOUNTER — Other Ambulatory Visit: Payer: Self-pay

## 2023-11-16 DIAGNOSIS — Z860100 Personal history of colon polyps, unspecified: Secondary | ICD-10-CM

## 2023-11-16 DIAGNOSIS — J8410 Pulmonary fibrosis, unspecified: Secondary | ICD-10-CM | POA: Diagnosis not present

## 2023-11-16 MED ORDER — PEG 3350-KCL-NA BICARB-NACL 420 G PO SOLR: 4000.0000 mL | Freq: Once | ORAL | 0 refills | Status: AC

## 2023-11-16 NOTE — Progress Notes (Signed)
 Daily Session Note  Patient Details  Name: Faith Padilla MRN: [id number] Date of Birth: 01/15/1951 Referring Provider:   Flowsheet Row Pulmonary Rehab from 08/25/2023 in [facility]  Referring Provider Dr. Luciano Cutter       Encounter Date: 11/16/2023  Check In:  Session Check In - 11/16/23 1402       Check-In   Supervising physician immediately available to respond to emergencies See telemetry face sheet for immediately available ER MD    Location [facility]    Staff Present Susann Givens RN,BSN;Joseph Weyman [place], [place], Exercise Physiologist    Virtual Visit No    Medication changes reported     No    Fall or balance concerns reported    No    Warm-up and Cool-down Performed on first and last piece of equipment    Resistance Training Performed Yes    VAD Patient? No    PAD/SET Patient? No      Pain Assessment   Currently in Pain? No/denies                Social History   Tobacco Use  Smoking Status Never   Passive exposure: Never (Parent)  Smokeless Tobacco Never    Goals Met:  Independence with exercise equipment Exercise tolerated well No report of concerns or symptoms today Strength training completed today  Goals Unmet:  Not Applicable  Comments: Pt able to follow exercise prescription today without complaint.  Will continue to monitor for progression.    Dr. Bethann Punches is Medical Director for [facility].  Dr. Vida Rigger is Medical Director for [facility] Pul[facility].

## 2023-11-16 NOTE — Telephone Encounter (Signed)
 Tried reaching patient today so we can schedule colonoscopy message for her to return call to office.

## 2023-11-16 NOTE — Telephone Encounter (Signed)
 Spoke with patient -scheduled colonoscopy with Dr.Wohl 12-17-23. Mailed instructions- Golytely sent to pharmacy,

## 2023-11-17 ENCOUNTER — Encounter (HOSPITAL_BASED_OUTPATIENT_CLINIC_OR_DEPARTMENT_OTHER): Payer: Self-pay | Admitting: Pulmonary Disease

## 2023-11-18 ENCOUNTER — Encounter: Payer: 59 | Admitting: *Deleted

## 2023-11-18 DIAGNOSIS — R0600 Dyspnea, unspecified: Secondary | ICD-10-CM

## 2023-11-18 DIAGNOSIS — J8410 Pulmonary fibrosis, unspecified: Secondary | ICD-10-CM | POA: Diagnosis not present

## 2023-11-18 NOTE — Progress Notes (Signed)
 Pulmonary Individual Treatment Plan  Patient Details  Name: Yuvia Plant MRN: [id number] Date of Birth: 08/20/1951 Referring Provider:   Flowsheet Row Pulmonary Rehab from 08/25/2023 in [facility]  Referring Provider Dr. Luciano Cutter       Initial Encounter Date:  Flowsheet Row Pulmonary Rehab from 08/25/2023 in [facility]  Date 08/25/23       Visit Diagnosis: Pulmonary fibrosis (HCC)  Dyspnea, unspecified type  Patient's Home Medications on Admission:  Current Outpatient Medications:    albuterol (VENTOLIN HFA) 108 (90 Base) MCG/ACT inhaler, Inhale 1-2 puffs into the lungs every 6 (six) hours as needed for wheezing or shortness of breath., Disp: 18 g, Rfl: 1   atorvastatin (LIPITOR) 20 MG tablet, TAKE 1 TABLET(20 MG) BY MOUTH AT BEDTIME, Disp: 90 tablet, Rfl: 0   Biotin 1 MG CAPS, Take 1 capsule by mouth daily., Disp: , Rfl:    budesonide-formoterol (SYMBICORT) 160-4.5 MCG/ACT inhaler, Inhale 2 puffs into the lungs 2 (two) times daily., Disp: 1 each, Rfl: 3   CALCIUM PO, Take by mouth., Disp: , Rfl:    cetirizine (ZYRTEC) 10 MG tablet, Take 1 tablet (10 mg total) by mouth at bedtime., Disp: 30 tablet, Rfl: 3   clotrimazole-betamethasone (LOTRISONE) cream, Apply 1 Application topically 2 (two) times daily., Disp: 30 g, Rfl: 0   DULoxetine (CYMBALTA) 60 MG capsule, Take 1 capsule (60 mg total) by mouth daily. Reported on 12/26/2015, Disp: 90 capsule, Rfl: 3   fluticasone (FLONASE) 50 MCG/ACT nasal spray, Place 2 sprays into both nostrils daily., Disp: 16 g, Rfl: 5   Gabapentin, Once-Daily, 300 MG TABS, Take 0.5 tablets (150 mg total) by mouth at bedtime., Disp: 45 tablet, Rfl: 1   hydrOXYzine (ATARAX) 50 MG tablet, Take by mouth., Disp: , Rfl:    ipratropium (ATROVENT) 0.03 % nasal spray, Place 2 sprays into both nostrils every 12 (twelve) hours. Use in place of flonase (not both together), Disp: 30 mL, Rfl: 0    ipratropium-albuterol (DUONEB) 0.5-2.5 (3) MG/3ML SOLN, Take 3 mLs by nebulization every 4 (four) hours as needed., Disp: 360 mL, Rfl: 3   levothyroxine (SYNTHROID) 75 MCG tablet, Take 1 tablet (75 mcg total) by mouth daily before breakfast., Disp: 90 tablet, Rfl: 3   meloxicam (MOBIC) 15 MG tablet, Take 1 tablet by mouth daily., Disp: , Rfl:    montelukast (SINGULAIR) 10 MG tablet, Take 1 tablet (10 mg total) by mouth at bedtime., Disp: 30 tablet, Rfl: 5   nystatin (MYCOSTATIN/NYSTOP) powder, Apply 1 Application topically 3 (three) times daily., Disp: 60 g, Rfl: 3   omeprazole (PRILOSEC) 20 MG capsule, TAKE 1 CAPSULE(20 MG) BY MOUTH TWICE DAILY BEFORE A MEAL, Disp: 180 capsule, Rfl: 0   predniSONE (DELTASONE) 20 MG tablet, Take 60mg  PO daily x 2 days, then40mg  PO daily x 2 days, then 20mg  PO daily x 3 days, Disp: 13 tablet, Rfl: 0   Probiotic Product (PROBIOTIC PO), Take by mouth., Disp: , Rfl:    topiramate (TOPAMAX) 50 MG tablet, Take 1 tablet (50 mg total) by mouth daily. (Patient not taking: Reported on 11/03/2023), Disp: 90 tablet, Rfl: 1   trimethoprim (TRIMPEX) 100 MG tablet, Take 1 tablet (100 mg total) by mouth daily., Disp: 30 tablet, Rfl: 11  Past Medical History: Past Medical History:  Diagnosis Date   Abdominal pain, right upper quadrant    Abdominal pain, RUQ (right upper quadrant) 03/12/2016   Achilles tendinitis of right lower extremity  06/25/2023   Acid reflux    Allergic conjunctivitis of both eyes 06/29/2017   Anemia 12/04/2019   Anxiety and depression    Asthma    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) 08/13/2021   Complication of anesthesia    slow to awaken, wakes coughing and SOB   COPD (chronic obstructive pulmonary disease) (HCC)    Depression    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   Diverticulitis    Dry eye syndrome of both eyes 08/13/2021   Dyspnea on exertion 08/13/2021   Family  history of problems related to stress 11/15/2021   Frequency of urination and polyuria 06/25/2022   GAD (generalized anxiety disorder) 08/15/2021   Glaucoma    H/O urinary incontinence 06/25/2022   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   High risk medication use 08/02/2018   HLD (hyperlipidemia)    Iron deficiency 12/04/2019   Isolation (social) 12/07/2021   Kidney stones    Microscopic hematuria    Mixed incontinence    Morbid obesity (HCC)    Ocular hypertension, 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   Ocular migraine 08/02/2018   Osteoarthritis    lumbar spine   Osteoarthritis of right knee 08/28/2023   Palpitations 08/13/2021   Plantar fasciitis 06/25/2023   Post covid-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, 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   Postoperative follow-up 03/30/2020   Prediabetes 08/14/2015   Pseudophakia, both eyes 06/26/2016   Rectal bleeding 10/17/2021   Shortness of breath dyspnea    Sleep apnea    CPAP   Vaginal yeast infection    Wears dentures    partial upper    Tobacco Use: Social History   Tobacco Use  Smoking Status Never   Passive exposure: Never (Parent)  Smokeless Tobacco Never    Labs: Review Flowsheet       Latest Ref Rng & Units 08/14/2015 08/15/2015 11/20/2015 05/07/2023  Labs for ITP Cardiac and Pulmonary Rehab  Cholestrol 100 - 199 mg/dL - 161  096  045   LDL (calc) 0 - 99 mg/dL - 80  87  81   HDL-C >40 mg/dL - 32  38  43   Trlycerides 0 - 149 mg/dL - 981  191  478   Hemoglobin A1c 4.8 - 5.6 % 5.6  - - 5.9      Pulmonary Assessment Scores:  Pulmonary Assessment Scores     Row Name 08/25/23 1523         ADL UCSD   ADL Phase Entry     SOB Score total 15     Rest 0     Walk 2     Stairs 5     Bath 2     Dress 2     Shop 4       CAT Score   CAT Score 18       mMRC Score   mMRC Score 1              UCSD: Self-administered rating of dyspnea associated with activities of daily living (ADLs) 6-point scale (0 = "not at all" to 5 = "maximal or unable to do  because of breathlessness")  Scoring Scores range from 0 to 120.  Minimally important difference is 5 units  CAT: CAT can identify the health impairment of COPD patients and is better correlated with disease progression.  CAT has a scoring range of zero to 40. The CAT score is classified into four groups of low (less than 10), medium (10 -  20), high (21-30) and very high (31-40) based on the impact level of disease on health status. A CAT score over 10 suggests significant symptoms.  A worsening CAT score could be explained by an exacerbation, poor medication adherence, poor inhaler technique, or progression of COPD or comorbid conditions.  CAT MCID is 2 points  mMRC: mMRC (Modified Medical Research Council) Dyspnea Scale is used to assess the degree of baseline functional disability in patients of respiratory disease due to dyspnea. No minimal important difference is established. A decrease in score of 1 point or greater is considered a positive change.   Pulmonary Function Assessment:   Exercise Target Goals: Exercise Program Goal: Individual exercise prescription set using results from initial 6 min walk test and THRR while considering  patient's activity barriers and safety.   Exercise Prescription Goal: Initial exercise prescription builds to 30-45 minutes a day of aerobic activity, 2-3 days per week.  Home exercise guidelines will be given to patient during program as part of exercise prescription that the participant will acknowledge.  Education: Aerobic Exercise: - Group verbal and visual presentation on the components of exercise prescription. Introduces F.I.T.T principle from ACSM for exercise prescriptions.  Reviews F.I.T.T. principles of aerobic exercise including progression. Written material given at graduation. Flowsheet Row Pulmonary Rehab from 09/23/2023 in [facility] Pulmonary Rehab  Education need identified 08/25/23       Education: Resistance Exercise: - Group  verbal and visual presentation on the components of exercise prescription. Introduces F.I.T.T principle from ACSM for exercise prescriptions  Reviews F.I.T.T. principles of resistance exercise including progression. Written material given at graduation.    Education: Exercise & Equipment Safety: - Individual verbal instruction and demonstration of equipment use and safety with use of the equipment. Flowsheet Row Pulmonary Rehab from 09/23/2023 in [facility] Re[facility]  Date 08/25/23  Educator [facility]  Instruction Review Code 1- Verbalizes Understanding       Education: Exercise Physiology & General Exercise Guidelines: - Group verbal and written instruction with models to review the exercise physiology of the cardiovascular system and associated critical values. Provides general exercise guidelines with specific guidelines to those with heart or lung disease.    Education: Flexibility, Balance, Mind/Body Relaxation: - Group verbal and visual presentation with interactive activity on the components of exercise prescription. Introduces F.I.T.T principle from ACSM for exercise prescriptions. Reviews F.I.T.T. principles of flexibility and balance exercise training including progression. Also discusses the mind body connection.  Reviews various relaxation techniques to help reduce and manage stress (i.e. Deep breathing, progressive muscle relaxation, and visualization). Balance handout provided to take home. Written material given at graduation.   Activity Barriers & Risk Stratification:  Activity Barriers & Cardiac Risk Stratification - 08/25/23 1516       Activity Barriers & Cardiac Risk Stratification   Activity Barriers Shortness of Breath;Left Hip Replacement;Back Problems             6 Minute Walk:  6 Minute Walk     Row Name 08/25/23 1514         6 Minute Walk   Phase Initial     Distance 930 feet     Walk Time 6 minutes     MPH 1.76     METS 1.3     RPE 13      Perceived Dyspnea  1     VO2 Peak 4.5     Symptoms No     Resting HR 86 bpm  Resting BP 106/68     Resting Oxygen Saturation  96 %     Exercise Oxygen Saturation  during 6 min walk 93 %     Max Ex. HR 102 bpm     Max Ex. BP 126/74     2 Minute Post BP 106/64       Interval HR   1 Minute HR 98     2 Minute HR 101     3 Minute HR 102     4 Minute HR 68     5 Minute HR 65     6 Minute HR 96     2 Minute Post HR 87     Interval Heart Rate? Yes       Interval Oxygen   Interval Oxygen? Yes     Baseline Oxygen Saturation % 96 %     1 Minute Oxygen Saturation % 98 %     1 Minute Liters of Oxygen 1 L     2 Minute Oxygen Saturation % 93 %     2 Minute Liters of Oxygen 1 L     3 Minute Oxygen Saturation % 94 %     3 Minute Liters of Oxygen 1 L     4 Minute Oxygen Saturation % 93 %     4 Minute Liters of Oxygen 1 L     5 Minute Oxygen Saturation % 94 %     5 Minute Liters of Oxygen 1 L     6 Minute Oxygen Saturation % 97 %     6 Minute Liters of Oxygen 1 L     2 Minute Post Oxygen Saturation % 96 %     2 Minute Post Liters of Oxygen 1 L             Oxygen Initial Assessment:  Oxygen Initial Assessment - 08/25/23 1521       Home Oxygen   Home Oxygen Device None      Initial 6 min Walk   Oxygen Used Continuous    Liters per minute 1      Program Oxygen Prescription   Program Oxygen Prescription Continuous    Liters per minute 1    Comments use as needed      Intervention   Short Term Goals To learn and exhibit compliance with exercise, home and travel O2 prescription;To learn and understand importance of monitoring SPO2 with pulse oximeter and demonstrate accurate use of the pulse oximeter.;To learn and understand importance of maintaining oxygen saturations>88%;To learn and demonstrate proper pursed lip breathing techniques or other breathing techniques. ;To learn and demonstrate proper use of respiratory medications    Long  Term Goals Exhibits compliance with  exercise, home  and travel O2 prescription;Verbalizes importance of monitoring SPO2 with pulse oximeter and return demonstration;Maintenance of O2 saturations>88%;Exhibits proper breathing techniques, such as pursed lip breathing or other method taught during program session;Compliance with respiratory medication;Demonstrates proper use of MDI's             Oxygen Re-Evaluation:  Oxygen Re-Evaluation     Row Name 08/27/23 1456 08/31/23 1411 10/08/23 1353 11/04/23 1359       Program Oxygen Prescription   Program Oxygen Prescription -- Continuous Continuous;None None    Liters per minute -- 1 -- --    Comments -- use as needed use as needed She has not required the use of supplemental O2 in the program, oxygen sat. has stayed above 88%  Home Oxygen   Home Oxygen Device -- Portable Concentrator;Home Concentrator Portable Concentrator;Home Concentrator Portable Concentrator;Home Concentrator    Sleep Oxygen Prescription -- CPAP CPAP CPAP    Liters per minute -- 0 0 0    Home Exercise Oxygen Prescription -- Continuous None None    Liters per minute -- 1  PRN -- --    Home Resting Oxygen Prescription -- None None None    Compliance with Home Oxygen Use -- Yes Yes Yes      Goals/Expected Outcomes   Short Term Goals -- To learn and demonstrate proper pursed lip breathing techniques or other breathing techniques.  To learn and understand importance of maintaining oxygen saturations>88%;To learn and understand importance of monitoring SPO2 with pulse oximeter and demonstrate accurate use of the pulse oximeter. To learn and understand importance of maintaining oxygen saturations>88%;To learn and understand importance of monitoring SPO2 with pulse oximeter and demonstrate accurate use of the pulse oximeter.    Long  Term Goals -- Exhibits proper breathing techniques, such as pursed lip breathing or other method taught during program session Verbalizes importance of monitoring SPO2 with pulse  oximeter and return demonstration;Maintenance of O2 saturations>88% Verbalizes importance of monitoring SPO2 with pulse oximeter and return demonstration;Maintenance of O2 saturations>88%    Comments Reviewed PLB technique with pt.  Talked about how it works and it's importance in maintaining their exercise saturations. Informed patient how to perform the Pursed Lipped breathing technique. Told patient to Inhale through the nose and out the mouth with pursed lips to keep their airways open, help oxygenate them better, practice when at rest or doing strenuous activity. Patient Verbalizes understanding of technique and will work on and be reiterated during [facility]. Nikie has a pulse oximeter to check her oxygen saturation at home. Informed and explained why it is important to have one. Reviewed that oxygen saturations should be 88 percent and above. Patient verbalizes understanding. Shatasha has recently recovered from bronchitis, she states that she was regularly checking her O2 saturation. She understands to keep her O2 saturation above 88%, and understands to use home oxygen when it falls below 88%.    Goals/Expected Outcomes Short: Become more profiecient at using PLB. Long: Become independent at using PLB. Short: use PLB with exertion. Long: use PLB on exertion proficiently and independently. Short: monitor oxygen at home with exertion. Long: maintain oxygen saturations above 88 percent independently. Short: monitor oxygen at home with exertion. Long: maintain oxygen saturations above 88 percent independently.             Oxygen Discharge (Final Oxygen Re-Evaluation):  Oxygen Re-Evaluation - 11/04/23 1359       Program Oxygen Prescription   Program Oxygen Prescription None    Comments She has not required the use of supplemental O2 in the program, oxygen sat. has stayed above 88%      Home Oxygen   Home Oxygen Device Portable Concentrator;Home Concentrator    Sleep Oxygen Prescription CPAP     Liters per minute 0    Home Exercise Oxygen Prescription None    Home Resting Oxygen Prescription None    Compliance with Home Oxygen Use Yes      Goals/Expected Outcomes   Short Term Goals To learn and understand importance of maintaining oxygen saturations>88%;To learn and understand importance of monitoring SPO2 with pulse oximeter and demonstrate accurate use of the pulse oximeter.    Long  Term Goals Verbalizes importance of monitoring SPO2 with pulse oximeter and return demonstration;Maintenance of  O2 saturations>88%    Comments Bettina has recently recovered from bronchitis, she states that she was regularly checking her O2 saturation. She understands to keep her O2 saturation above 88%, and understands to use home oxygen when it falls below 88%.    Goals/Expected Outcomes Short: monitor oxygen at home with exertion. Long: maintain oxygen saturations above 88 percent independently.             Initial Exercise Prescription:  Initial Exercise Prescription - 08/25/23 1500       Date of Initial Exercise RX and Referring Provider   Date 08/25/23    Referring Provider Dr. Luciano Cutter      Oxygen   Oxygen Continuous    Liters 1L    Maintain Oxygen Saturation 88% or higher      NuStep   Level 1    SPM 80    Minutes 15    METs 1.3      Arm Ergometer   Level 1    Watts 25    RPM 50    Minutes 15    METs 1.3      REL-XR   Level 1    Speed 50    Minutes 15    METs 1.3      Track   Laps 10    Minutes 15    METs 1.54      Prescription Details   Duration Progress to 30 minutes of continuous aerobic without signs/symptoms of physical distress      Intensity   THRR 40-80% of Max Heartrate 110-135    Ratings of Perceived Exertion 11-13    Perceived Dyspnea 0-4      Progression   Progression Continue to progress workloads to maintain intensity without signs/symptoms of physical distress.      Resistance Training   Training Prescription Yes    Weight 2lb     Reps 10-15             Perform Capillary Blood Glucose checks as needed.  Exercise Prescription Changes:   Exercise Prescription Changes     Row Name 08/25/23 1500 09/10/23 1200 10/07/23 1100 10/14/23 1400 10/20/23 1500     Response to Exercise   Blood Pressure (Admit) 106/68 104/62 118/64 -- 114/78   Blood Pressure (Exercise) 126/74 128/72 -- -- 156/70   Blood Pressure (Exit) 106/64 108/62 122/66 -- 126/72   Heart Rate (Admit) 86 bpm 86 bpm 110 bpm -- 92 bpm   Heart Rate (Exercise) 102 bpm 101 bpm 113 bpm -- 113 bpm   Heart Rate (Exit) 87 bpm 92 bpm 109 bpm -- 90 bpm   Oxygen Saturation (Admit) 96 % 94 % 91 % -- 98 %   Oxygen Saturation (Exercise) 93 % 92 % 91 % -- 89 %   Oxygen Saturation (Exit) 96 % 92 % 93 % -- 96 %   Rating of Perceived Exertion (Exercise) 13 13 16  -- 15   Perceived Dyspnea (Exercise) 1 0 0 -- 1   Symptoms none none none -- none   Comments results First two weeks of exercise -- -- --   Duration -- Progress to 30 minutes of  aerobic without signs/symptoms of physical distress Progress to 30 minutes of  aerobic without signs/symptoms of physical distress Progress to 30 minutes of  aerobic without signs/symptoms of physical distress Progress to 30 minutes of  aerobic without signs/symptoms of physical distress   Intensity -- THRR unchanged THRR unchanged THRR unchanged  THRR unchanged     Progression   Progression -- Continue to progress workloads to maintain intensity without signs/symptoms of physical distress. Continue to progress workloads to maintain intensity without signs/symptoms of physical distress. Continue to progress workloads to maintain intensity without signs/symptoms of physical distress. Continue to progress workloads to maintain intensity without signs/symptoms of physical distress.   Average METs -- 2.12 2.28 2.28 2.5     Resistance Training   Training Prescription -- Yes Yes Yes Yes   Weight -- 2lb 2lb 2lb 2lb   Reps -- 10-15  10-15 10-15 10-15     Interval Training   Interval Training -- No No No No     Oxygen   Oxygen -- Continuous Continuous Continuous Continuous   Liters -- 0-1L 0-1L 0-1L 0-1L     Recumbant Bike   Level -- -- -- -- 3   Watts -- -- -- -- 20   Minutes -- -- -- -- 15   METs -- -- -- -- 2.73     NuStep   Level -- 2 3 3 4    Minutes -- 15 15 15 15    METs -- 2.9 3 3  2.8     Arm Ergometer   Level -- -- 1 1 1    Minutes -- -- 15 15 15    METs -- -- 1 1 2.4     REL-XR   Level -- -- 1 1 4    Minutes -- -- 15 15 15    METs -- -- 2.1 2.1 3.6     T5 Nustep   Level -- -- -- -- 3   Minutes -- -- -- -- 15   METs -- -- -- -- 2.1     Track   Laps -- -- 14 14 20    Minutes -- -- 15 15 15    METs -- -- 1.76 1.76 2.09     Home Exercise Plan   Plans to continue exercise at -- -- -- Home (comment)  walking and hand weights Home (comment)  walking and hand weights   Frequency -- -- -- Add 2 additional days to program exercise sessions. Add 2 additional days to program exercise sessions.   Initial Home Exercises Provided -- -- -- 10/14/23 10/14/23     Oxygen   Maintain Oxygen Saturation -- 88% or higher 88% or higher 88% or higher 88% or higher    Row Name 11/04/23 0800             Response to Exercise   Blood Pressure (Admit) 128/70       Blood Pressure (Exit) 128/72       Heart Rate (Admit) 68 bpm       Heart Rate (Exercise) 120 bpm       Heart Rate (Exit) 119 bpm       Oxygen Saturation (Admit) 93 %       Oxygen Saturation (Exercise) 92 %       Oxygen Saturation (Exit) 95 %       Rating of Perceived Exertion (Exercise) 15       Perceived Dyspnea (Exercise) 0       Symptoms none       Duration Progress to 30 minutes of  aerobic without signs/symptoms of physical distress       Intensity THRR unchanged         Progression   Progression Continue to progress workloads to maintain intensity without signs/symptoms of physical distress.  Average METs 2.8         Resistance  Training   Training Prescription Yes       Weight 2lb       Reps 10-15         Oxygen   Oxygen Continuous       Liters 0-1L         NuStep   Level 4       Minutes 15       METs 3.5         Track   Laps 20       Minutes 15       METs 2.09         Home Exercise Plan   Plans to continue exercise at Home (comment)  walking and hand weights       Frequency Add 2 additional days to program exercise sessions.       Initial Home Exercises Provided 10/14/23         Oxygen   Maintain Oxygen Saturation 88% or higher                Exercise Comments:   Exercise Comments     Row Name 08/27/23 1455           Exercise Comments First full day of exercise!  Patient was oriented to gym and equipment including functions, settings, policies, and procedures.  Patient's individual exercise prescription and treatment plan were reviewed.  All starting workloads were established based on the results of the 6 minute walk test done at initial orientation visit.  The plan for exercise progression was also introduced and progression will be customized based on patient's performance and goals.                Exercise Goals and Review:   Exercise Goals     Row Name 08/25/23 1520             Exercise Goals   Increase Physical Activity Yes       Intervention Develop an individualized exercise prescription for aerobic and resistive training based on initial evaluation findings, risk stratification, comorbidities and participant's personal goals.;Provide advice, education, support and counseling about physical activity/exercise needs.       Expected Outcomes Long Term: Exercising regularly at least 3-5 days a week.;Long Term: Add in home exercise to make exercise part of routine and to increase amount of physical activity.;Short Term: Attend rehab on a regular basis to increase amount of physical activity.       Increase Strength and Stamina Yes       Intervention Develop an  individualized exercise prescription for aerobic and resistive training based on initial evaluation findings, risk stratification, comorbidities and participant's personal goals.;Provide advice, education, support and counseling about physical activity/exercise needs.       Expected Outcomes Long Term: Improve cardiorespiratory fitness, muscular endurance and strength as measured by increased METs and functional capacity ( );Short Term: Perform resistance training exercises routinely during rehab and add in resistance training at home;Short Term: Increase workloads from initial exercise prescription for resistance, speed, and METs.       Able to understand and use rate of perceived exertion (RPE) scale Yes       Intervention Provide education and explanation on how to use RPE scale       Expected Outcomes Long Term:  Able to use RPE to guide intensity level when exercising independently;Short Term: Able to use RPE daily in rehab to  express subjective intensity level       Able to understand and use Dyspnea scale Yes       Intervention Provide education and explanation on how to use Dyspnea scale       Expected Outcomes Short Term: Able to use Dyspnea scale daily in rehab to express subjective sense of shortness of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 of breath and what they can do to improve. Patient has been informed of breathing techniques when starting the program. Patient is informed to tell staff if they have had any med changes and that certain meds they are taking or not taking can be causing shortness of breath. Summerlynn states she is not checking her blood pressure at home and does not want a blood pressure cuff at home. She states she gets her reading at the doctors and at rehab and her blood pressure is good. Her blood pressure has been stable in the program with no concerning readings. Her weight has gone up slightly and wants to lose about 50 pounds. Mahogany has not been checking her BP at home. She has been checking her oxygen saturation as of recent because of her recent sickness. She states that she will begin to check her oxygen saturation when she starts to exercise at home. She understands the importance of breathing techniques and understands when to utilize them.     Expected Outcomes Short: Attend [facility] regularly to improve shortness of breath with ADL's. Long: maintain independence with ADL's Short: lose a few pounds in a few weeks. Long: reach your weight goal. Short: maintain BP, start checking oxygen saturation at home. Long: Continue to exercise at home and in program to manage risk factors.              Core Components/Risk Factors/Patient Goals at Discharge (Final Review):   Goals and Risk Factor Review - 11/04/23 1408       Core Components/Risk Factors/Patient Goals Review   Personal Goals Review Improve shortness of breath with ADL's    Review Jamica has not been checking her BP at home. She has been checking her oxygen  saturation as of recent because of her recent sickness. She states that she will begin to check her oxygen saturation when she starts to exercise at home. She understands the importance of breathing techniques and understands when to utilize them.    Expected Outcomes Short: maintain BP, start checking oxygen saturation at home. Long: Continue to exercise at home and in program to manage risk factors.             ITP Comments:  ITP Comments     Row Name 08/24/23 1354 08/25/23 1513 08/26/23 1211 08/27/23 1455 09/23/23 1313   ITP Comments Virtual orientation call completed today. shehas an appointment on Date: 08/25/2023  for EP eval and gym Orientation.  Documentation of diagnosis [facility] Date: 08/11/2023 . Completed and gym orientation. Initial ITP created and sent for review to Dr. Jinny Sanders, Medical Director. 30 Day review completed. Medical Director ITP review done, changes made as directed, and signed approval by Medical Director.    new to program orientation only completed First full day of exercise!  Patient was oriented to gym and equipment including functions, settings, policies, and procedures.  Patient's individual exercise prescription and treatment plan were reviewed.  All starting workloads were established based on the results of the 6 minute walk test done at initial orientation visit.  The plan for exercise progression was also introduced and progression will be customized based on patient's performance and goals. 30 Day  review completed. Medical Director ITP review done, changes made as directed, and signed approval by Medical Director.    new to program    Row Name 10/21/23 1130 11/18/23 1032         ITP Comments 30 Day review completed. Medical Director ITP review done, changes made as directed, and signed approval by Medical Director. 30 Day review completed. Medical Director ITP review done, changes made as directed, and signed approval by Medical  Director.               Comments: 30 day review

## 2023-11-18 NOTE — Progress Notes (Signed)
 Daily Session Note  Patient Details  Name: Faith Padilla MRN: [id number] Date of Birth: 06-01-1951 Referring Provider:   Flowsheet Row Pulmonary Rehab from 08/25/2023 in [facility] Card[facility] Pulmonary Rehab  Referring Provider Dr. Luciano Cutter       Encounter Date: 11/18/2023  Check In:  Session Check In - 11/18/23 1419       Check-In   Supervising physician immediately available to respond to emergencies See telemetry face sheet for immediately available ER MD    Location [facility]-Cardiac & Pulmonary Rehab    Staff Present Susann Givens RN,BSN;Susanne Bice, RN, BSN, CCRP;Maxon Conetta BS, Exercise Physiologist;Noah Tickle, BS, Exercise Physiologist    Virtual Visit No    Medication changes reported     No    Fall or balance concerns reported    No    Warm-up and Cool-down Performed on first and last piece of equipment    Resistance Training Performed Yes    VAD Patient? No    PAD/SET Patient? No      Pain Assessment   Currently in Pain? No/denies                Social History   Tobacco Use  Smoking Status Never   Passive exposure: Never (Parent)  Smokeless Tobacco Never    Goals Met:  Independence with exercise equipment Exercise tolerated well No report of concerns or symptoms today Strength training completed today  Goals Unmet:  Not Applicable  Comments: Pt able to follow exercise prescription today without complaint.  Will continue to monitor for progression.    Dr. Bethann Punches is Medical Director for [facility] Cardiac Rehabilitation.  Dr. Vida Rigger is Medical Director for [facility] Pulmonary Rehabilitation.

## 2023-11-19 ENCOUNTER — Encounter: Payer: 59 | Admitting: *Deleted

## 2023-11-19 DIAGNOSIS — J8410 Pulmonary fibrosis, unspecified: Secondary | ICD-10-CM

## 2023-11-19 DIAGNOSIS — R0600 Dyspnea, unspecified: Secondary | ICD-10-CM

## 2023-11-19 NOTE — Progress Notes (Signed)
 Daily Session Note  Patient Details  Name: Faith Padilla MRN: [id number] Date of Birth: December 06, 1950 Referring Provider:   Flowsheet Row Pulmonary Rehab from 08/25/2023 in [facility] Cardiac and Pulmonary Rehab  Referring Provider Dr. Luciano Cutter       Encounter Date: 11/19/2023  Check In:  Session Check In - 11/19/23 1433       Check-In   Supervising physician immediately available to respond to emergencies See telemetry face sheet for immediately available ER MD    Location [facility] Pulmonary Re[facility]    Staff Present Susann Givens RN,BSN;Joseph Reino Kent RCP,RRT,BSRT;Noah Tickle, [place], Exercise Physiologist;Maxon Conetta BS, Exercise Physiologist    Virtual Visit No    Medication changes reported     No    Fall or balance concerns reported    No    Warm-up and Cool-down Performed on first and last piece of equipment    Resistance Training Performed Yes    VAD Patient? No      Pain Assessment   Currently in Pain? No/denies                Social History   Tobacco Use  Smoking Status Never   Passive exposure: Never (Parent)  Smokeless Tobacco Never    Goals Met:  Independence with exercise equipment Exercise tolerated well No report of concerns or symptoms today Strength training completed today  Goals Unmet:  Not Applicable  Comments: Pt able to follow exercise prescription today without complaint.  Will continue to monitor for progression.    Dr. Bethann Punches is Medical Director for [facility] Cardiac Rehabilitation.  Dr. Vida Rigger is Medical Director for [facility] Pulmonary Rehabilitation.
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
## 2023-11-23 ENCOUNTER — Encounter: Payer: 59 | Admitting: *Deleted

## 2023-11-23 DIAGNOSIS — J8410 Pulmonary fibrosis, unspecified: Secondary | ICD-10-CM

## 2023-11-23 DIAGNOSIS — R0600 Dyspnea, unspecified: Secondary | ICD-10-CM

## 2023-11-23 NOTE — Progress Notes (Signed)
 Daily Session Note  Patient Details  Name: Faith Padilla MRN: [id number] Date of Birth: 11-Oct-1950 Referring Provider:   Flowsheet Row Pulmonary Rehab from 08/25/2023 in [facility] Cardiac and Pulmonary Rehab  Referring Provider Dr. Luciano Cutter       Encounter Date: 11/23/2023  Check In:  Session Check In - 11/23/23 1354       Check-In   Supervising physician immediately available to respond to emergencies See telemetry face sheet for immediately available ER MD    Location [facility]-Cardiac & Pulmonary Rehab    Staff Present Susann Givens RN,BSN;Joseph Weyman [place], [place], Exercise Physiologist;Laureen Manson Passey, [place], RRT, CPFT    Virtual Visit No    Medication changes reported     No    Fall or balance concerns reported    No    Warm-up and Cool-down Performed on first and last piece of equipment    Resistance Training Performed Yes    VAD Patient? No    PAD/SET Patient? No                Social History   Tobacco Use  Smoking Status Never   Passive exposure: Never (Parent)  Smokeless Tobacco Never    Goals Met:  Exercise tolerated well No report of concerns or symptoms today Strength training completed today  Goals Unmet:  Not Applicable  Comments: Pt able to follow exercise prescription today without complaint.  Will continue to monitor for progression.    Dr. Bethann Punches is Medical Director for [facility] Cardiac Rehabilitation.  Dr. Vida Rigger is Medical Director for [facility] Pulmonary Rehabilitation.

## 2023-11-24 ENCOUNTER — Ambulatory Visit (INDEPENDENT_AMBULATORY_CARE_PROVIDER_SITE_OTHER): Payer: 59 | Admitting: Psychiatry

## 2023-11-24 ENCOUNTER — Encounter: Payer: Self-pay | Admitting: Psychiatry

## 2023-11-24 VITALS — BP 128/82 | HR 98 | Temp 98.3°F | Ht 62.0 in | Wt 245.8 lb

## 2023-11-24 DIAGNOSIS — G4700 Insomnia, unspecified: Secondary | ICD-10-CM

## 2023-11-24 DIAGNOSIS — F3341 Major depressive disorder, recurrent, in partial remission: Secondary | ICD-10-CM | POA: Diagnosis not present

## 2023-11-24 DIAGNOSIS — F4310 Post-traumatic stress disorder, unspecified: Secondary | ICD-10-CM | POA: Diagnosis not present

## 2023-11-24 MED ORDER — TRAZODONE HCL 50 MG PO TABS: 25.0000 mg | ORAL_TABLET | Freq: Every evening | ORAL | 1 refills | Status: DC | PRN

## 2023-11-24 NOTE — Patient Instructions (Signed)
 Continue duloxetine 60 mg daily  Hold hydroxyzine  Reduce melatonin 6 mg at night Start trazodone 25-50 mg at night as needed for insomnia Next appointment: 4/29 at 10 am

## 2023-11-25 ENCOUNTER — Encounter: Admitting: *Deleted

## 2023-11-25 DIAGNOSIS — R0600 Dyspnea, unspecified: Secondary | ICD-10-CM

## 2023-11-25 DIAGNOSIS — J8410 Pulmonary fibrosis, unspecified: Secondary | ICD-10-CM

## 2023-11-25 NOTE — Progress Notes (Signed)
 Daily Session Note  Patient Details  Name: Faith Padilla MRN: [id number] Date of Birth: 1950/10/07 Referring Provider:   Flowsheet Row Pulmonary Rehab from 08/25/2023 in [facility] Cardiac and Pulmonary Rehab  Referring Provider Dr. Luciano Cutter       Encounter Date: 11/25/2023  Check In:  Session Check In - 11/25/23 1600       Check-In   Supervising physician immediately available to respond to emergencies See telemetry face sheet for immediately available ER MD    Location [facility]-Cardiac & Pulmonary Rehab    Staff Present Susann Givens RN,BSN;Susanne Bice, RN, BSN, CCRP;Margaret Best, MS, Exercise Physiologist;Kelly Cloretta Ned, ACSM CEP, Exercise Physiologist;Noah Tickle, BS, Exercise Physiologist    Virtual Visit No    Medication changes reported     No    Fall or balance concerns reported    No    Warm-up and Cool-down Performed on first and last piece of equipment    Resistance Training Performed Yes    VAD Patient? No    PAD/SET Patient? No      Pain Assessment   Currently in Pain? No/denies                Social History   Tobacco Use  Smoking Status Never   Passive exposure: Never (Parent)  Smokeless Tobacco Never    Goals Met:  Independence with exercise equipment Exercise tolerated well No report of concerns or symptoms today Strength training completed today  Goals Unmet:  Not Applicable  Comments: Pt able to follow exercise prescription today without complaint.  Will continue to monitor for progression.    Dr. Bethann Punches is Medical Director for [facility] Cardiac Rehabilitation.  Dr. Vida Rigger is Medical Director for [facility] Pulmonary Rehabilitation.

## 2023-11-26 ENCOUNTER — Encounter

## 2023-11-30 ENCOUNTER — Encounter

## 2023-12-02 ENCOUNTER — Encounter: Admitting: *Deleted

## 2023-12-02 DIAGNOSIS — J8410 Pulmonary fibrosis, unspecified: Secondary | ICD-10-CM

## 2023-12-02 DIAGNOSIS — R0600 Dyspnea, unspecified: Secondary | ICD-10-CM

## 2023-12-02 NOTE — Progress Notes (Signed)
 Daily Session Note  Patient Details  Name: Faith Padilla MRN: [id number] Date of Birth: 1951/06/01 Referring Provider:   Flowsheet Row Pulmonary Rehab from 08/25/2023 in [facility] Cardiac and Pulmonary Rehab  Referring Provider Dr. Luciano Cutter       Encounter Date: 12/02/2023  Check In:  Session Check In - 12/02/23 1345       Check-In   Supervising physician immediately available to respond to emergencies See telemetry face sheet for immediately available ER MD    Location [facility]-Cardiac & Pulmonary Rehab    Staff Present Susann Givens RN,BSN;Kelly Madilyn Fireman BS, ACSM CEP, Exercise Physiologist;Noah Tickle, BS, Exercise Physiologist;Jason Wallace Cullens RDN,LDN    Virtual Visit No    Medication changes reported     No    Fall or balance concerns reported    Yes    Comments sore, no injuries    Warm-up and Cool-down Performed on first and last piece of equipment    Resistance Training Performed Yes    VAD Patient? No    PAD/SET Patient? No      Pain Assessment   Currently in Pain? No/denies                Social History   Tobacco Use  Smoking Status Never   Passive exposure: Never (Parent)  Smokeless Tobacco Never    Goals Met:  Independence with exercise equipment Exercise tolerated well No report of concerns or symptoms today Strength training completed today  Goals Unmet:  Not Applicable  Comments: Pt able to follow exercise prescription today without complaint.  Will continue to monitor for progression.    Dr. Bethann Punches is Medical Director for [facility] Cardiac Rehabilitation.  Dr. Vida Rigger is Medical Director for [facility] Pulmonary Rehabilitation.

## 2023-12-03 ENCOUNTER — Encounter: Admitting: *Deleted

## 2023-12-03 DIAGNOSIS — R0600 Dyspnea, unspecified: Secondary | ICD-10-CM

## 2023-12-03 DIAGNOSIS — J8410 Pulmonary fibrosis, unspecified: Secondary | ICD-10-CM

## 2023-12-03 NOTE — Progress Notes (Signed)
 Daily Session Note  Patient Details  Name: Faith Padilla MRN: [id number] Date of Birth: 23-Jul-1951 Referring Provider:   Flowsheet Row Pulmonary Rehab from 08/25/2023 in [facility] Cardiac and Pulmonary Rehab  Referring Provider Dr. Luciano Cutter       Encounter Date: 12/03/2023  Check In:  Session Check In - 12/03/23 1356       Check-In   Supervising physician immediately available to respond to emergencies See telemetry face sheet for immediately available ER MD    Location [facility]-Cardiac & Pulmonary Rehab    Staff Present Susann Givens RN,BSN;Joseph [facility] BS, Exercise Physiologist;Noah Tickle, BS, Exercise Physiologist    Virtual Visit No    Medication changes reported     No    Fall or balance concerns reported    No    Warm-up and Cool-down Performed on first and last piece of equipment    Resistance Training Performed Yes    VAD Patient? No    PAD/SET Patient? No      Pain Assessment   Currently in Pain? No/denies                Social History   Tobacco Use  Smoking Status Never   Passive exposure: Never (Parent)  Smokeless Tobacco Never    Goals Met:  Independence with exercise equipment Exercise tolerated well No report of concerns or symptoms today Strength training completed today  Goals Unmet:  Not Applicable  Comments: Pt able to follow exercise prescription today without complaint.  Will continue to monitor for progression.    Dr. Bethann Punches is Medical Director for [facility] Cardiac Rehabilitation.  Dr. Vida Rigger is Medical Director for [facility] Pulmonary Rehabilitation.

## 2023-12-05 ENCOUNTER — Other Ambulatory Visit: Payer: Self-pay | Admitting: Family Medicine

## 2023-12-05 DIAGNOSIS — J3489 Other specified disorders of nose and nasal sinuses: Secondary | ICD-10-CM

## 2023-12-07 ENCOUNTER — Encounter: Admitting: *Deleted

## 2023-12-07 DIAGNOSIS — R0600 Dyspnea, unspecified: Secondary | ICD-10-CM

## 2023-12-07 DIAGNOSIS — J8410 Pulmonary fibrosis, unspecified: Secondary | ICD-10-CM

## 2023-12-07 NOTE — Progress Notes (Signed)
 Daily Session Note  Patient Details  Name: Faith Padilla MRN: [id number] Date of Birth: 1950/09/14 Referring Provider:   Flowsheet Row Pulmonary Rehab from 08/25/2023 in [facility] Cardiac and Pulmonary Rehab  Referring Provider Dr. Luciano Cutter       Encounter Date: 12/07/2023  Check In:  Session Check In - 12/07/23 1353       Check-In   Supervising physician immediately available to respond to emergencies See telemetry face sheet for immediately available ER MD    Location [facility]-Cardiac & Pulmonary Rehab    Staff Present Elige Ko RCP,RRT,BSRT;Yohann Curl Jewel Baize RN,BSN;Maxon Conetta BS, Exercise Physiologist;Noah Tickle, BS, Exercise Physiologist    Virtual Visit No    Medication changes reported     No    Fall or balance concerns reported    No    Warm-up and Cool-down Performed on first and last piece of equipment    Resistance Training Performed Yes    VAD Patient? No    PAD/SET Patient? No      Pain Assessment   Currently in Pain? No/denies                Social History   Tobacco Use  Smoking Status Never   Passive exposure: Never (Parent)  Smokeless Tobacco Never    Goals Met:  Independence with exercise equipment Exercise tolerated well No report of concerns or symptoms today Strength training completed today  Goals Unmet:  Not Applicable  Comments: Pt able to follow exercise prescription today without complaint.  Will continue to monitor for progression.    Dr. Bethann Punches is Medical Director for [facility] Cardiac Rehabilitation.  Dr. Vida Rigger is Medical Director for [facility] Pulmonary Rehabilitation.

## 2023-12-08 NOTE — Telephone Encounter (Signed)
 Requested medication (s) are due for refill today: yes  Requested medication (s) are on the active medication list: yes  Last refill:  10/20/23  Future visit scheduled: no  Notes to clinic:   Medication not assigned to a protocol, review manually.      Requested Prescriptions  Pending Prescriptions Disp Refills   ipratropium (ATROVENT) 0.03 % nasal spray [Pharmacy Med Name: IPRATROPIUM 0.03% NAS SP30ML (345)] 30 mL 0    Sig: PLACE 2 SPRAYS INTO BOTH NOSTRILS EVERY 12 HOURS USE IN PLACE OF FLONASE     Off-Protocol Failed - 12/08/2023 10:31 AM      Failed - Medication not assigned to a protocol, review manually.      Passed - Valid encounter within last 12 months    Recent Outpatient Visits           1 month ago Dehydration   [facility] [place], Monico Blitz, DO   1 month ago Acute cough   [facility] [place], Monico Blitz, DO       Future Appointments             In 2 months Alfredo Martinez, MD [facility] [place]           Off-Protocol Failed - 12/08/2023 10:31 AM      Failed - Medication not assigned to a protocol, review manually.      Passed - Valid encounter within last 12 months    Recent Outpatient Visits           1 month ago Dehydration   [facility] [place], Monico Blitz, DO   1 month ago Acute cough   [facility] [place], Monico Blitz, DO       Future Appointments             In 2 months MacDiarmid, Lorin Picket, MD [facility]

## 2023-12-09 ENCOUNTER — Encounter: Attending: Pulmonary Disease | Admitting: *Deleted

## 2023-12-09 DIAGNOSIS — R0600 Dyspnea, unspecified: Secondary | ICD-10-CM | POA: Diagnosis present

## 2023-12-09 DIAGNOSIS — J8410 Pulmonary fibrosis, unspecified: Secondary | ICD-10-CM | POA: Insufficient documentation

## 2023-12-09 NOTE — Progress Notes (Signed)
 Daily Session Note  Patient Details  Name: Faith Padilla MRN: [id number] Date of Birth: 03-Apr-1951 Referring Provider:   Flowsheet Row Pulmonary Rehab from 08/25/2023 in [facility] Cardiac and Pulmonary Rehab  Referring Provider Dr. Luciano Cutter       Encounter Date: 12/09/2023  Check In:  Session Check In - 12/09/23 1357       Check-In   Supervising physician immediately available to respond to emergencies See telemetry face sheet for immediately available ER MD    Location [facility]-Cardiac & Pulmonary Rehab    Staff Present Susann Givens RN,BSN;Kelly Madilyn Fireman BS, ACSM CEP, Exercise Physiologist;Noah Tickle, BS, Exercise Physiologist;Jason Wallace Cullens RDN,LDN    Virtual Visit No    Medication changes reported     No    Fall or balance concerns reported    No    Warm-up and Cool-down Performed on first and last piece of equipment    Resistance Training Performed Yes    VAD Patient? No    PAD/SET Patient? No      Pain Assessment   Currently in Pain? No/denies                Social History   Tobacco Use  Smoking Status Never   Passive exposure: Never (Parent)  Smokeless Tobacco Never    Goals Met:  Independence with exercise equipment Exercise tolerated well No report of concerns or symptoms today Strength training completed today  Goals Unmet:  Not Applicable  Comments: Pt able to follow exercise prescription today without complaint.  Will continue to monitor for progression.    Dr. Bethann Punches is Medical Director for [facility] Cardiac Rehabilitation.  Dr. Vida Rigger is Medical Director for [facility] Pulmonary Rehabilitation.

## 2023-12-10 ENCOUNTER — Encounter: Admitting: *Deleted

## 2023-12-10 DIAGNOSIS — J8410 Pulmonary fibrosis, unspecified: Secondary | ICD-10-CM | POA: Diagnosis not present

## 2023-12-10 DIAGNOSIS — R0600 Dyspnea, unspecified: Secondary | ICD-10-CM

## 2023-12-10 NOTE — Progress Notes (Signed)
 Daily Session Note  Patient Details  Name: Faith Padilla MRN: [id number] Date of Birth: September 05, 1951 Referring Provider:   Flowsheet Row Pulmonary Rehab from 08/25/2023 in [facility] Cardiac and Pulmonary Rehab  Referring Provider Dr. Luciano Cutter       Encounter Date: 12/10/2023  Check In:  Session Check In - 12/10/23 1402       Check-In   Supervising physician immediately available to respond to emergencies See telemetry face sheet for immediately available ER MD    Location [facility]-Cardiac & Pulmonary Rehab    Staff Present Susann Givens RN,BSN;Joseph Reino Kent RCP,RRT,BSRT;Noah Tickle, [place], Exercise Physiologist;Maxon Conetta BS, Exercise Physiologist    Virtual Visit No    Medication changes reported     No    Fall or balance concerns reported    No    Warm-up and Cool-down Performed on first and last piece of equipment    Resistance Training Performed Yes    VAD Patient? No    PAD/SET Patient? No      Pain Assessment   Currently in Pain? No/denies                Social History   Tobacco Use  Smoking Status Never   Passive exposure: Never (Parent)  Smokeless Tobacco Never    Goals Met:  Independence with exercise equipment Exercise tolerated well No report of concerns or symptoms today Strength training completed today  Goals Unmet:  Not Applicable  Comments: Pt able to follow exercise prescription today without complaint.  Will continue to monitor for progression.    Dr. Bethann Punches is Medical Director for [facility] Cardiac Rehabilitation.  Dr. Vida Rigger is Medical Director for [facility] Pulmonary Rehabilitation.

## 2023-12-14 ENCOUNTER — Encounter

## 2023-12-16 ENCOUNTER — Encounter

## 2023-12-16 ENCOUNTER — Encounter: Payer: Self-pay | Admitting: *Deleted

## 2023-12-16 DIAGNOSIS — J8410 Pulmonary fibrosis, unspecified: Secondary | ICD-10-CM

## 2023-12-16 DIAGNOSIS — R0600 Dyspnea, unspecified: Secondary | ICD-10-CM

## 2023-12-16 NOTE — Progress Notes (Signed)
 Pulmonary Individual Treatment Plan  Patient Details  Name: Faith Padilla MRN: [id number] Date of Birth: 07/30/1951 Referring Provider:   Flowsheet Row Pulmonary Rehab from 08/25/2023 in [facility] Cardiac and Pulmonary Rehab  Referring Provider Dr. Luciano Cutter       Initial Encounter Date:  Flowsheet Row Pulmonary Rehab from 08/25/2023 in [facility] Cardiac and Pulmonary Rehab  Date 08/25/23       Visit Diagnosis: Pulmonary fibrosis (HCC)  Dyspnea, unspecified type  Patient's Home Medications on Admission:  Current Outpatient Medications:    albuterol (VENTOLIN HFA) 108 (90 Base) MCG/ACT inhaler, Inhale 1-2 puffs into the lungs every 6 (six) hours as needed for wheezing or shortness of breath., Disp: 18 g, Rfl: 1   atorvastatin (LIPITOR) 20 MG tablet, TAKE 1 TABLET(20 MG) BY MOUTH AT BEDTIME, Disp: 90 tablet, Rfl: 0   Biotin 1 MG CAPS, Take 1 capsule by mouth daily., Disp: , Rfl:    budesonide-formoterol (SYMBICORT) 160-4.5 MCG/ACT inhaler, Inhale 2 puffs into the lungs 2 (two) times daily., Disp: 1 each, Rfl: 3   CALCIUM PO, Take by mouth., Disp: , Rfl:    cetirizine (ZYRTEC) 10 MG tablet, Take 1 tablet (10 mg total) by mouth at bedtime., Disp: 30 tablet, Rfl: 3   clotrimazole-betamethasone (LOTRISONE) cream, Apply 1 Application topically 2 (two) times daily., Disp: 30 g, Rfl: 0   DULoxetine (CYMBALTA) 60 MG capsule, Take 1 capsule (60 mg total) by mouth daily. Reported on 12/26/2015, Disp: 90 capsule, Rfl: 3   fluticasone (FLONASE) 50 MCG/ACT nasal spray, Place 2 sprays into both nostrils daily., Disp: 16 g, Rfl: 5   Gabapentin, Once-Daily, 300 MG TABS, Take 0.5 tablets (150 mg total) by mouth at bedtime., Disp: 45 tablet, Rfl: 1   ipratropium (ATROVENT) 0.03 % nasal spray, PLACE 2 SPRAYS INTO BOTH NOSTRILS EVERY 12 HOURS USE IN PLACE OF FLONASE, Disp: 30 mL, Rfl: 0   ipratropium-albuterol (DUONEB) 0.5-2.5 (3) MG/3ML SOLN, Take 3 mLs by nebulization every 4 (four) hours as needed.,  Disp: 360 mL, Rfl: 3   levothyroxine (SYNTHROID) 75 MCG tablet, Take 1 tablet (75 mcg total) by mouth daily before breakfast., Disp: 90 tablet, Rfl: 3   meloxicam (MOBIC) 15 MG tablet, Take 1 tablet by mouth daily., Disp: , Rfl:    montelukast (SINGULAIR) 10 MG tablet, Take 1 tablet (10 mg total) by mouth at bedtime., Disp: 30 tablet, Rfl: 5   nystatin (MYCOSTATIN/NYSTOP) powder, Apply 1 Application topically 3 (three) times daily., Disp: 60 g, Rfl: 3   omeprazole (PRILOSEC) 20 MG capsule, TAKE 1 CAPSULE(20 MG) BY MOUTH TWICE DAILY BEFORE A MEAL, Disp: 180 capsule, Rfl: 0   Probiotic Product (PROBIOTIC PO), Take by mouth., Disp: , Rfl:    topiramate (TOPAMAX) 50 MG tablet, Take 1 tablet (50 mg total) by mouth daily., Disp: 90 tablet, Rfl: 1   traZODone (DESYREL) 50 MG tablet, Take 0.5-1 tablets (25-50 mg total) by mouth at bedtime as needed for sleep., Disp: 30 tablet, Rfl: 1   trimethoprim (TRIMPEX) 100 MG tablet, Take 1 tablet (100 mg total) by mouth daily., Disp: 30 tablet, Rfl: 11  Past Medical History: Past Medical History:  Diagnosis Date   Abdominal pain, right upper quadrant    Abdominal pain, RUQ (right upper quadrant) 03/12/2016   Achilles tendinitis of right lower extremity 06/25/2023   Acid reflux    Allergic conjunctivitis of both eyes 06/29/2017   Anemia 12/04/2019   Anxiety and depression    Asthma  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) 08/13/2021   Complication of anesthesia    slow to awaken, wakes coughing and SOB   COPD (chronic obstructive pulmonary disease) (HCC)    Depression    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   Diverticulitis    Dry eye syndrome of both eyes 08/13/2021   Dyspnea on exertion 08/13/2021   Family history of problems related to stress 11/15/2021   Frequency of urination and polyuria 06/25/2022   GAD (generalized anxiety disorder) 08/15/2021   Glaucoma    H/O  urinary incontinence 06/25/2022   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   High risk medication use 08/02/2018   HLD (hyperlipidemia)    Iron deficiency 12/04/2019   Isolation (social) 12/07/2021   Kidney stones    Microscopic hematuria    Mixed incontinence    Morbid obesity (HCC)    Ocular hypertension, 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   Ocular migraine 08/02/2018   Osteoarthritis    lumbar spine   Osteoarthritis of right knee 08/28/2023   Palpitations 08/13/2021   Plantar fasciitis 06/25/2023   Post covid-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, 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   Postoperative follow-up 03/30/2020   Prediabetes 08/14/2015   Pseudophakia, both eyes 06/26/2016   Rectal bleeding 10/17/2021   Shortness of breath dyspnea    Sleep apnea    CPAP   Vaginal yeast infection    Wears dentures    partial upper    Tobacco Use: Social History   Tobacco Use  Smoking Status Never   Passive exposure: Never (Parent)  Smokeless Tobacco Never    Labs: Review Flowsheet       Latest Ref Rng & Units 08/14/2015 08/15/2015 11/20/2015 05/07/2023  Labs for ITP Cardiac and Pulmonary Rehab  Cholestrol 100 - 199 mg/dL - 259  563  875   LDL (calc) 0 - 99 mg/dL - 80  87  81   HDL-C >64 mg/dL - 32  38  43   Trlycerides 0 - 149 mg/dL - 332  951  884   Hemoglobin A1c 4.8 - 5.6 % 5.6  - - 5.9      Pulmonary Assessment Scores:  Pulmonary Assessment Scores     Row Name 08/25/23 1523         ADL UCSD   ADL Phase Entry     SOB Score total 15     Rest 0     Walk 2     Stairs 5     Bath 2     Dress 2     Shop 4       CAT Score   CAT Score 18       mMRC Score   mMRC Score 1              UCSD: Self-administered rating of dyspnea associated with activities of daily living (ADLs) 6-point scale (0 = "not at all" to 5 = "maximal or unable to do because of breathlessness")  Scoring Scores range from 0 to 120.  Minimally important difference is 5 units  CAT: CAT can identify the health impairment of COPD  patients and is better correlated with disease progression.  CAT has a scoring range of zero to 40. The CAT score is classified into four groups of low (less than 10), medium (10 - 20), high (21-30) and very high (31-40) based on the impact level of disease on health status. A CAT score over 10 suggests significant symptoms.  A worsening CAT  score could be explained by an exacerbation, poor medication adherence, poor inhaler technique, or progression of COPD or comorbid conditions.  CAT MCID is 2 points  mMRC: mMRC (Modified Medical Research Council) Dyspnea Scale is used to assess the degree of baseline functional disability in patients of respiratory disease due to dyspnea. No minimal important difference is established. A decrease in score of 1 point or greater is considered a positive change.   Pulmonary Function Assessment:   Exercise Target Goals: Exercise Program Goal: Individual exercise prescription set using results from initial 6 min walk test and THRR while considering  patient's activity barriers and safety.   Exercise Prescription Goal: Initial exercise prescription builds to 30-45 minutes a day of aerobic activity, 2-3 days per week.  Home exercise guidelines will be given to patient during program as part of exercise prescription that the participant will acknowledge.  Education: Aerobic Exercise: - Group verbal and visual presentation on the components of exercise prescription. Introduces F.I.T.T principle from ACSM for exercise prescriptions.  Reviews F.I.T.T. principles of aerobic exercise including progression. Written material given at graduation. Flowsheet Row Pulmonary Rehab from 12/09/2023 in [facility] Cardiac and Pulmonary Rehab  Education need identified 08/25/23       Education: Resistance Exercise: - Group verbal and visual presentation on the components of exercise prescription. Introduces F.I.T.T principle from ACSM for exercise prescriptions  Reviews F.I.T.T.  principles of resistance exercise including progression. Written material given at graduation. Flowsheet Row Pulmonary Rehab from 12/09/2023 in [facility] Cardiac and Pulmonary Rehab  Date 11/18/23  Educator [facility]  Instruction Review Code 1- [employer] Understanding        Education: Exercise & Equipment Safety: - Individual verbal instruction and demonstration of equipment use and safety with use of the equipment. Flowsheet Row Pulmonary Rehab from 12/09/2023 in [facility] Cardiac and Pulmonary Rehab  Date 08/25/23  Educator [facility]  Instruction Review Code 1- Verbalizes Understanding       Education: Exercise Physiology & General Exercise Guidelines: - Group verbal and written instruction with models to review the exercise physiology of the cardiovascular system and associated critical values. Provides general exercise guidelines with specific guidelines to those with heart or lung disease.    Education: Flexibility, Balance, Mind/Body Relaxation: - Group verbal and visual presentation with interactive activity on the components of exercise prescription. Introduces F.I.T.T principle from ACSM for exercise prescriptions. Reviews F.I.T.T. principles of flexibility and balance exercise training including progression. Also discusses the mind body connection.  Reviews various relaxation techniques to help reduce and manage stress (i.e. Deep breathing, progressive muscle relaxation, and visualization). Balance handout provided to take home. Written material given at graduation. Flowsheet Row Pulmonary Rehab from 12/09/2023 in [facility] Cardiac and Pulmonary Rehab  Date 11/18/23  Educator [facility]  Instruction Review Code 1- Verbalizes Understanding       Activity Barriers & Risk Stratification:  Activity Barriers & Cardiac Risk Stratification - 08/25/23 1516       Activity Barriers & Cardiac Risk Stratification   Activity Barriers Shortness of Breath;Left Hip Replacement;Back Problems             6 Minute  Walk:  6 Minute Walk     Row Name 08/25/23 1514         6 Minute Walk   Phase Initial     Distance 930 feet     Walk Time 6 minutes     MPH 1.76     METS 1.3     RPE 13  Perceived Dyspnea  1     VO2 Peak 4.5     Symptoms No     Resting HR 86 bpm     Resting BP 106/68     Resting Oxygen Saturation  96 %     Exercise Oxygen Saturation  during 6 min walk 93 %     Max Ex. HR 102 bpm     Max Ex. BP 126/74     2 Minute Post BP 106/64       Interval HR   1 Minute HR 98     2 Minute HR 101     3 Minute HR 102     4 Minute HR 68     5 Minute HR 65     6 Minute HR 96     2 Minute Post HR 87     Interval Heart Rate? Yes       Interval Oxygen   Interval Oxygen? Yes     Baseline Oxygen Saturation % 96 %     1 Minute Oxygen Saturation % 98 %     1 Minute Liters of Oxygen 1 L     2 Minute Oxygen Saturation % 93 %     2 Minute Liters of Oxygen 1 L     3 Minute Oxygen Saturation % 94 %     3 Minute Liters of Oxygen 1 L     4 Minute Oxygen Saturation % 93 %     4 Minute Liters of Oxygen 1 L     5 Minute Oxygen Saturation % 94 %     5 Minute Liters of Oxygen 1 L     6 Minute Oxygen Saturation % 97 %     6 Minute Liters of Oxygen 1 L     2 Minute Post Oxygen Saturation % 96 %     2 Minute Post Liters of Oxygen 1 L             Oxygen Initial Assessment:  Oxygen Initial Assessment - 08/25/23 1521       Home Oxygen   Home Oxygen Device None      Initial 6 min Walk   Oxygen Used Continuous    Liters per minute 1      Program Oxygen Prescription   Program Oxygen Prescription Continuous    Liters per minute 1    Comments use as needed      Intervention   Short Term Goals To learn and exhibit compliance with exercise, home and travel O2 prescription;To learn and understand importance of monitoring SPO2 with pulse oximeter and demonstrate accurate use of the pulse oximeter.;To learn and understand importance of maintaining oxygen saturations>88%;To learn and  demonstrate proper pursed lip breathing techniques or other breathing techniques. ;To learn and demonstrate proper use of respiratory medications    Long  Term Goals Exhibits compliance with exercise, home  and travel O2 prescription;Verbalizes importance of monitoring SPO2 with pulse oximeter and return demonstration;Maintenance of O2 saturations>88%;Exhibits proper breathing techniques, such as pursed lip breathing or other method taught during program session;Compliance with respiratory medication;Demonstrates proper use of MDI's             Oxygen Re-Evaluation:  Oxygen Re-Evaluation     Row Name 08/27/23 1456 08/31/23 1411 10/08/23 1353 11/04/23 1359 12/02/23 1420     Program Oxygen Prescription   Program Oxygen Prescription -- Continuous Continuous;None None None   Liters per minute -- 1 -- -- --  Comments -- use as needed use as needed She has not required the use of supplemental O2 in the program, oxygen sat. has stayed above 88% --     Home Oxygen   Home Oxygen Device -- Portable Concentrator;Home Concentrator Portable Concentrator;Home Concentrator Portable Concentrator;Home Concentrator Portable Concentrator;Home Concentrator   Sleep Oxygen Prescription -- CPAP CPAP CPAP CPAP   Liters per minute -- 0 0 0 0   Home Exercise Oxygen Prescription -- Continuous None None None   Liters per minute -- 1  PRN -- -- --   Home Resting Oxygen Prescription -- None None None None   Compliance with Home Oxygen Use -- Yes Yes Yes Yes     Goals/Expected Outcomes   Short Term Goals -- To learn and demonstrate proper pursed lip breathing techniques or other breathing techniques.  To learn and understand importance of maintaining oxygen saturations>88%;To learn and understand importance of monitoring SPO2 with pulse oximeter and demonstrate accurate use of the pulse oximeter. To learn and understand importance of maintaining oxygen saturations>88%;To learn and understand importance of monitoring  SPO2 with pulse oximeter and demonstrate accurate use of the pulse oximeter. To learn and understand importance of maintaining oxygen saturations>88%;To learn and understand importance of monitoring SPO2 with pulse oximeter and demonstrate accurate use of the pulse oximeter.   Long  Term Goals -- Exhibits proper breathing techniques, such as pursed lip breathing or other method taught during program session Verbalizes importance of monitoring SPO2 with pulse oximeter and return demonstration;Maintenance of O2 saturations>88% Verbalizes importance of monitoring SPO2 with pulse oximeter and return demonstration;Maintenance of O2 saturations>88% Maintenance of O2 saturations>88%;Verbalizes importance of monitoring SPO2 with pulse oximeter and return demonstration   Comments Reviewed PLB technique with pt.  Talked about how it works and it's importance in maintaining their exercise saturations. Informed patient how to perform the Pursed Lipped breathing technique. Told patient to Inhale through the nose and out the mouth with pursed lips to keep their airways open, help oxygenate them better, practice when at rest or doing strenuous activity. Patient Verbalizes understanding of technique and will work on and be reiterated during [facility]. Catherine has a pulse oximeter to check her oxygen saturation at home. Informed and explained why it is important to have one. Reviewed that oxygen saturations should be 88 percent and above. Patient verbalizes understanding. Shannette has recently recovered from bronchitis, she states that she was regularly checking her O2 saturation. She understands to keep her O2 saturation above 88%, and understands to use home oxygen when it falls below 88%. --   Goals/Expected Outcomes Short: Become more profiecient at using PLB. Long: Become independent at using PLB. Short: use PLB with exertion. Long: use PLB on exertion proficiently and independently. Short: monitor oxygen at home with exertion.  Long: maintain oxygen saturations above 88 percent independently. Short: monitor oxygen at home with exertion. Long: maintain oxygen saturations above 88 percent independently. --            Oxygen Discharge (Final Oxygen Re-Evaluation):  Oxygen Re-Evaluation - 12/02/23 1420       Program Oxygen Prescription   Program Oxygen Prescription None      Home Oxygen   Home Oxygen Device Portable Concentrator;Home Concentrator    Sleep Oxygen Prescription CPAP    Liters per minute 0    Home Exercise Oxygen Prescription None    Home Resting Oxygen Prescription None    Compliance with Home Oxygen Use Yes      Goals/Expected Outcomes  Short Term Goals To learn and understand importance of maintaining oxygen saturations>88%;To learn and understand importance of monitoring SPO2 with pulse oximeter and demonstrate accurate use of the pulse oximeter.    Long  Term Goals Maintenance of O2 saturations>88%;Verbalizes importance of monitoring SPO2 with pulse oximeter and return demonstration             Initial Exercise Prescription:  Initial Exercise Prescription - 08/25/23 1500       Date of Initial Exercise RX and Referring Provider   Date 08/25/23    Referring Provider Dr. Luciano Cutter      Oxygen   Oxygen Continuous    Liters 1L    Maintain Oxygen Saturation 88% or higher      NuStep   Level 1    SPM 80    Minutes 15    METs 1.3      Arm Ergometer   Level 1    Watts 25    RPM 50    Minutes 15    METs 1.3      REL-XR   Level 1    Speed 50    Minutes 15    METs 1.3      Track   Laps 10    Minutes 15    METs 1.54      Prescription Details   Duration Progress to 30 minutes of continuous aerobic without signs/symptoms of physical distress      Intensity   THRR 40-80% of Max Heartrate 110-135    Ratings of Perceived Exertion 11-13    Perceived Dyspnea 0-4      Progression   Progression Continue to progress workloads to maintain intensity without  signs/symptoms of physical distress.      Resistance Training   Training Prescription Yes    Weight 2lb    Reps 10-15             Perform Capillary Blood Glucose checks as needed.  Exercise Prescription Changes:   Exercise Prescription Changes     Row Name 08/25/23 1500 09/10/23 1200 10/07/23 1100 10/14/23 1400 10/20/23 1500     Response to Exercise   Blood Pressure (Admit) 106/68 104/62 118/64 -- 114/78   Blood Pressure (Exercise) 126/74 128/72 -- -- 156/70   Blood Pressure (Exit) 106/64 108/62 122/66 -- 126/72   Heart Rate (Admit) 86 bpm 86 bpm 110 bpm -- 92 bpm   Heart Rate (Exercise) 102 bpm 101 bpm 113 bpm -- 113 bpm   Heart Rate (Exit) 87 bpm 92 bpm 109 bpm -- 90 bpm   Oxygen Saturation (Admit) 96 % 94 % 91 % -- 98 %   Oxygen Saturation (Exercise) 93 % 92 % 91 % -- 89 %   Oxygen Saturation (Exit) 96 % 92 % 93 % -- 96 %   Rating of Perceived Exertion (Exercise) 13 13 16  -- 15   Perceived Dyspnea (Exercise) 1 0 0 -- 1   Symptoms none none none -- none   Comments results First two weeks of exercise -- -- --   Duration -- Progress to 30 minutes of  aerobic without signs/symptoms of physical distress Progress to 30 minutes of  aerobic without signs/symptoms of physical distress Progress to 30 minutes of  aerobic without signs/symptoms of physical distress Progress to 30 minutes of  aerobic without signs/symptoms of physical distress   Intensity -- THRR unchanged THRR unchanged THRR unchanged THRR unchanged     Progression   Progression --  Continue to progress workloads to maintain intensity without signs/symptoms of physical distress. Continue to progress workloads to maintain intensity without signs/symptoms of physical distress. Continue to progress workloads to maintain intensity without signs/symptoms of physical distress. Continue to progress workloads to maintain intensity without signs/symptoms of physical distress.   Average METs -- 2.12 2.28 2.28 2.5      Resistance Training   Training Prescription -- Yes Yes Yes Yes   Weight -- 2lb 2lb 2lb 2lb   Reps -- 10-15 10-15 10-15 10-15     Interval Training   Interval Training -- No No No No     Oxygen   Oxygen -- Continuous Continuous Continuous Continuous   Liters -- 0-1L 0-1L 0-1L 0-1L     Recumbant Bike   Level -- -- -- -- 3   Watts -- -- -- -- 20   Minutes -- -- -- -- 15   METs -- -- -- -- 2.73     NuStep   Level -- 2 3 3 4    Minutes -- 15 15 15 15    METs -- 2.9 3 3  2.8     Arm Ergometer   Level -- -- 1 1 1    Minutes -- -- 15 15 15    METs -- -- 1 1 2.4     REL-XR   Level -- -- 1 1 4    Minutes -- -- 15 15 15    METs -- -- 2.1 2.1 3.6     T5 Nustep   Level -- -- -- -- 3   Minutes -- -- -- -- 15   METs -- -- -- -- 2.1     Track   Laps -- -- 14 14 20    Minutes -- -- 15 15 15    METs -- -- 1.76 1.76 2.09     Home Exercise Plan   Plans to continue exercise at -- -- -- Home (comment)  walking and hand weights Home (comment)  walking and hand weights   Frequency -- -- -- Add 2 additional days to program exercise sessions. Add 2 additional days to program exercise sessions.   Initial Home Exercises Provided -- -- -- 10/14/23 10/14/23     Oxygen   Maintain Oxygen Saturation -- 88% or higher 88% or higher 88% or higher 88% or higher    Row Name 11/04/23 0800 11/19/23 1600 11/30/23 1400         Response to Exercise   Blood Pressure (Admit) 128/70 120/68 112/72     Blood Pressure (Exit) 128/72 102/60 128/76     Heart Rate (Admit) 68 bpm 98 bpm 83 bpm     Heart Rate (Exercise) 120 bpm 122 bpm 152 bpm     Heart Rate (Exit) 119 bpm 111 bpm 103 bpm     Oxygen Saturation (Admit) 93 % 95 % 92 %     Oxygen Saturation (Exercise) 92 % 90 % 90 %     Oxygen Saturation (Exit) 95 % 94 % 93 %     Rating of Perceived Exertion (Exercise) 15 15 15      Perceived Dyspnea (Exercise) 0 0 1     Symptoms none none none     Duration Progress to 30 minutes of  aerobic without signs/symptoms of  physical distress Progress to 30 minutes of  aerobic without signs/symptoms of physical distress Progress to 30 minutes of  aerobic without signs/symptoms of physical distress     Intensity THRR unchanged THRR unchanged THRR unchanged  Progression   Progression Continue to progress workloads to maintain intensity without signs/symptoms of physical distress. Continue to progress workloads to maintain intensity without signs/symptoms of physical distress. Continue to progress workloads to maintain intensity without signs/symptoms of physical distress.     Average METs 2.8 2.62 2.7       Resistance Training   Training Prescription Yes Yes Yes     Weight 2lb 2lb 2lb     Reps 10-15 10-15 10-15       Interval Training   Interval Training -- No No       Oxygen   Oxygen Continuous Continuous Continuous     Liters 0-1L 0-1L 0-1L       Recumbant Bike   Level -- 4 5     Watts -- 29 33     Minutes -- 15 15     METs -- 2.71 --       NuStep   Level 4 4 5      Minutes 15 15 15      METs 3.5 3.5 3.7       REL-XR   Level -- 4 5     Minutes -- 15 15     METs -- 4.1 3.9       T5 Nustep   Level -- 4 5     Minutes -- 15 15     METs -- 2.2 1.6       Track   Laps 20 26 27      Minutes 15 15 15      METs 2.09 2.41 2.47       Home Exercise Plan   Plans to continue exercise at Home (comment)  walking and hand weights Home (comment)  walking and hand weights Home (comment)  walking and hand weights     Frequency Add 2 additional days to program exercise sessions. Add 2 additional days to program exercise sessions. Add 2 additional days to program exercise sessions.     Initial Home Exercises Provided 10/14/23 10/14/23 10/14/23       Oxygen   Maintain Oxygen Saturation 88% or higher 88% or higher 88% or higher              Exercise Comments:   Exercise Comments     Row Name 08/27/23 1455           Exercise Comments First full day of exercise!  Patient was oriented to gym and  equipment including functions, settings, policies, and procedures.  Patient's individual exercise prescription and treatment plan were reviewed.  All starting workloads were established based on the results of the 6 minute walk test done at initial orientation visit.  The plan for exercise progression was also introduced and progression will be customized based on patient's performance and goals.                Exercise Goals and Review:   Exercise Goals     Row Name 08/25/23 1520             Exercise Goals   Increase Physical Activity Yes       Intervention Develop an individualized exercise prescription for aerobic and resistive training based on initial evaluation findings, risk stratification, comorbidities and participant's personal goals.;Provide advice, education, support and counseling about physical activity/exercise needs.       Expected Outcomes Long Term: Exercising regularly at least 3-5 days a week.;Long Term: Add in home exercise to make exercise part of routine and to increase  amount of physical activity.;Short Term: Attend rehab on a regular basis to increase amount of physical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 to properly store meds and maintain the prescription regimen.  Written material given at graduation.   Other: -Provides group and verbal instruction on various topics (see comments)   Knowledge Questionnaire Score:  Knowledge Questionnaire Score - 08/25/23 1525       Knowledge Questionnaire Score   Pre Score 14/18              Core Components/Risk Factors/Patient Goals at Admission:  Personal Goals and Risk Factors at Admission - 08/24/23 1343       Core Components/Risk Factors/Patient Goals on Admission    Weight Management Yes;Weight Maintenance    Intervention Weight Management: Develop a combined nutrition and exercise program designed to reach desired caloric intake, while maintaining appropriate intake of nutrient and fiber, sodium and fats, and appropriate energy expenditure required for the weight goal.;Weight Management: Provide education and appropriate resources to help participant work on and attain dietary goals.    Admit Weight 230 lb (104.3 kg)   weighed 290 lb prior to knee surgery worked down to 223 lb   Goal Weight: Long Term 230 lb (104.3 kg)    Expected Outcomes Short Term: Continue to assess and modify interventions until short term weight is achieved;Long Term: Adherence to nutrition and physical activity/exercise program aimed toward attainment of established weight goal;Weight Maintenance: Understanding of the daily nutrition guidelines, which includes 25-35% calories from fat, 7% or less cal from saturated fats, less than 200mg  cholesterol, less than 1.5gm of sodium, & 5 or more servings of fruits and vegetables daily    Improve shortness of breath with ADL's Yes    Intervention Provide education, individualized exercise plan and daily activity instruction to help decrease symptoms of SOB with  activities of daily living.    Expected Outcomes Short Term: Improve cardiorespiratory fitness to achieve a reduction of symptoms when performing ADLs;Long Term: Be able to perform more ADLs without symptoms or delay the onset of symptoms    Increase knowledge of respiratory medications and ability to use respiratory devices properly  Yes    Intervention Provide education and demonstration as needed of appropriate use of medications, inhalers, and oxygen therapy.    Expected Outcomes Short Term: Achieves understanding of medications use. Understands that oxygen is a medication prescribed by physician. Demonstrates appropriate use of inhaler and oxygen therapy.;Long Term: Maintain appropriate use of medications, inhalers, and oxygen therapy.    Lipids Yes    Intervention Provide education and support for participant on nutrition & aerobic/resistive exercise along with prescribed medications to achieve LDL 70mg , HDL >40mg .    Expected Outcomes Short Term: Participant states understanding of desired cholesterol values and is compliant with medications prescribed. Participant is following exercise prescription and nutrition guidelines.;Long Term: Cholesterol controlled with medications as prescribed, with individualized exercise RX and with personalized nutrition plan. Value goals: LDL < 70mg , HDL > 40 mg.             Education:Diabetes - Individual verbal and written instruction to review signs/symptoms of  diabetes, desired ranges of glucose level fasting, after meals and with exercise. Acknowledge that pre and post exercise glucose checks will be done for 3 sessions at entry of program.   Know Your Numbers and Heart Failure: - Group verbal and visual instruction to discuss disease risk factors for cardiac and pulmonary disease and treatment options.  Reviews associated critical values for Overweight/Obesity, Hypertension, Cholesterol, and Diabetes.  Discusses basics of heart failure:  signs/symptoms and treatments.  Introduces Heart Failure Zone chart for action plan for heart failure.  Written material given at graduation.   Core Components/Risk Factors/Patient Goals Review:   Goals and Risk Factor Review     Row Name 08/31/23 1413 10/08/23 1359 11/04/23 1408 12/02/23 1417       Core Components/Risk Factors/Patient Goals Review   Personal Goals Review Improve shortness of breath with ADL's Improve shortness of breath with ADL's Improve shortness of breath with ADL's Improve shortness of breath with ADL's    Review Spoke to patient about their shortness of breath and what they can do to improve. Patient has been informed of breathing techniques when starting the program. Patient is informed to tell staff if they have had any med changes and that certain meds they are taking or not taking can be causing shortness of breath. Mileena states she is not checking her blood pressure at home and does not want a blood pressure cuff at home. She states she gets her reading at the doctors and at rehab and her blood pressure is good. Her blood pressure has been stable in the program with no concerning readings. Her weight has gone up slightly and wants to lose about 50 pounds. Kiah has not been checking her BP at home. She has been checking her oxygen saturation as of recent because of her recent sickness. She states that she will begin to check her oxygen saturation when she starts to exercise at home. She understands the importance of breathing techniques and understands when to utilize them. Spoke with Jaiyana about her shortness of breath and ADLs. She knows the importance of exercise to help her breathing. Reviewed PLB and she verbalizes understanding.    Expected Outcomes Short: Attend [facility] regularly to improve shortness of breath with ADL's. Long: maintain independence with ADL's Short: lose a few pounds in a few weeks. Long: reach your weight goal. Short: maintain BP, start checking  oxygen saturation at home. Long: Continue to exercise at home and in program to manage risk factors. STG: start checking O2 saturations at home. LTG. Continue to exercise at home and in program to manage rick factors             Core Components/Risk Factors/Patient Goals at Discharge (Final Review):   Goals and Risk Factor Review - 12/02/23 1417       Core Components/Risk Factors/Patient Goals Review   Personal Goals Review Improve shortness of breath with ADL's    Review Spoke with Sharronda about her shortness of breath and ADLs. She knows the importance of exercise to help her breathing. Reviewed PLB and she verbalizes understanding.    Expected Outcomes STG: start checking O2 saturations at home. LTG. Continue to exercise at home and in program to manage rick factors             ITP Comments:  ITP Comments     Row Name 08/24/23 1354 08/25/23 1513 08/26/23 1211 08/27/23 1455 09/23/23 1313   ITP Comments Virtual orientation call completed today. shehas an appointment on  Date: 08/25/2023  for EP eval and gym Orientation.  Documentation of diagnosis can be found in [facility] Date: 08/11/2023 . Completed and gym orientation. Initial ITP created and sent for review to Dr. Jinny Sanders, Medical Director. 30 Day review completed. Medical Director ITP review done, changes made as directed, and signed approval by Medical Director.    new to program orientation only completed First full day of exercise!  Patient was oriented to gym and equipment including functions, settings, policies, and procedures.  Patient's individual exercise prescription and treatment plan were reviewed.  All starting workloads were established based on the results of the 6 minute walk test done at initial orientation visit.  The plan for exercise progression was also introduced and progression will be customized based on patient's performance and goals. 30 Day review completed. Medical Director ITP review done, changes made  as directed, and signed approval by Medical Director.    new to program    Row Name 10/21/23 1130 11/18/23 1032 12/16/23 0823       ITP Comments 30 Day review completed. Medical Director ITP review done, changes made as directed, and signed approval by Medical Director. 30 Day review completed. Medical Director ITP review done, changes made as directed, and signed approval by Medical Director. 30 Day review completed. Medical Director ITP review done, changes made as directed, and signed approval by Medical Director.              Comments:

## 2023-12-17 ENCOUNTER — Ambulatory Visit: Admitting: Certified Registered"

## 2023-12-17 ENCOUNTER — Encounter: Payer: Self-pay | Admitting: Gastroenterology

## 2023-12-17 ENCOUNTER — Ambulatory Visit
Admission: RE | Admit: 2023-12-17 | Discharge: 2023-12-17 | Disposition: A | Discharge status: Home / Self Care | Attending: Gastroenterology | Admitting: Gastroenterology

## 2023-12-17 ENCOUNTER — Encounter

## 2023-12-17 ENCOUNTER — Encounter
Admission: RE | Disposition: A | Discharge status: Home / Self Care | Payer: Self-pay | Source: Home / Self Care | Attending: Gastroenterology

## 2023-12-17 DIAGNOSIS — K641 Second degree hemorrhoids: Secondary | ICD-10-CM | POA: Insufficient documentation

## 2023-12-17 DIAGNOSIS — R195 Other fecal abnormalities: Secondary | ICD-10-CM | POA: Diagnosis not present

## 2023-12-17 DIAGNOSIS — G4730 Sleep apnea, unspecified: Secondary | ICD-10-CM | POA: Diagnosis not present

## 2023-12-17 DIAGNOSIS — D124 Benign neoplasm of descending colon: Secondary | ICD-10-CM | POA: Insufficient documentation

## 2023-12-17 DIAGNOSIS — K5730 Diverticulosis of large intestine without perforation or abscess without bleeding: Secondary | ICD-10-CM | POA: Diagnosis not present

## 2023-12-17 DIAGNOSIS — Z6841 Body Mass Index (BMI) 40.0 and over, adult: Secondary | ICD-10-CM | POA: Insufficient documentation

## 2023-12-17 DIAGNOSIS — K219 Gastro-esophageal reflux disease without esophagitis: Secondary | ICD-10-CM | POA: Diagnosis not present

## 2023-12-17 DIAGNOSIS — K5140 Inflammatory polyps of colon without complications: Secondary | ICD-10-CM

## 2023-12-17 DIAGNOSIS — Z860100 Personal history of colon polyps, unspecified: Secondary | ICD-10-CM

## 2023-12-17 DIAGNOSIS — Z1211 Encounter for screening for malignant neoplasm of colon: Secondary | ICD-10-CM | POA: Diagnosis present

## 2023-12-17 DIAGNOSIS — E66813 Obesity, class 3: Secondary | ICD-10-CM | POA: Insufficient documentation

## 2023-12-17 DIAGNOSIS — K635 Polyp of colon: Secondary | ICD-10-CM

## 2023-12-17 HISTORY — PX: POLYPECTOMY: SHX149

## 2023-12-17 HISTORY — PX: COLONOSCOPY: SHX5424

## 2023-12-17 SURGERY — COLONOSCOPY
Anesthesia: General

## 2023-12-17 MED ORDER — PHENYLEPHRINE 80 MCG/ML (10ML) SYRINGE FOR IV PUSH (FOR BLOOD PRESSURE SUPPORT)
PREFILLED_SYRINGE | INTRAVENOUS | Status: AC
  Filled 2023-12-17: qty 10

## 2023-12-17 MED ORDER — SODIUM CHLORIDE 0.9 % IV SOLN
INTRAVENOUS | Status: DC
Start: 2023-12-17 — End: 2023-12-17

## 2023-12-17 MED ORDER — PROPOFOL 10 MG/ML IV BOLUS
INTRAVENOUS | Status: AC
  Filled 2023-12-17: qty 40

## 2023-12-17 MED ORDER — LIDOCAINE HCL (PF) 2 % IJ SOLN
INTRAMUSCULAR | Status: AC
  Filled 2023-12-17: qty 5

## 2023-12-17 MED ORDER — LIDOCAINE HCL (PF) 2 % IJ SOLN
INTRAMUSCULAR | Status: DC | PRN
  Administered 2023-12-17: 100 mg via INTRADERMAL

## 2023-12-17 MED ORDER — PROPOFOL 500 MG/50ML IV EMUL
INTRAVENOUS | Status: DC | PRN
  Administered 2023-12-17: 20 mg via INTRAVENOUS
  Administered 2023-12-17: 120 ug/kg/min via INTRAVENOUS
  Administered 2023-12-17: 30 mg via INTRAVENOUS
  Administered 2023-12-17: 120 mg via INTRAVENOUS

## 2023-12-17 MED ORDER — EPHEDRINE 5 MG/ML INJ
INTRAVENOUS | Status: AC
  Filled 2023-12-17: qty 5

## 2023-12-17 NOTE — H&P (Signed)
 Midge Minium, MD [facility] [address], [address] [place], [place] [address] Phone:[phone] Fax : [phone]  Primary Care Physician:  Sherlyn Hay, DO Primary Gastroenterologist:  Dr. Servando Snare  Pre-Procedure History & Physical: HPI:  Faith Padilla is a 73 y.o. female is here for an colonoscopy.   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   Diverticulitis    Dry eye syndrome of both eyes 08/13/2021   Dyspnea on exertion 08/13/2021   Family history of problems related to stress 11/15/2021   Frequency of urination and polyuria 06/25/2022   GAD (generalized anxiety disorder) 08/15/2021   Glaucoma    H/O urinary incontinence 06/25/2022   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   High risk medication use 08/02/2018   HLD (hyperlipidemia)    Iron deficiency 12/04/2019   Isolation (social) 12/07/2021   Kidney stones    Microscopic hematuria    Mixed incontinence    Morbid obesity (HCC)    Ocular hypertension, 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   Ocular migraine 08/02/2018   Osteoarthritis    lumbar spine   Osteoarthritis of right knee 08/28/2023   Palpitations 08/13/2021   Plantar fasciitis 06/25/2023   Post covid-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, 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   Postoperative follow-up 03/30/2020   Prediabetes 08/14/2015   Pseudophakia, both eyes 06/26/2016   Rectal bleeding 10/17/2021   Shortness of breath dyspnea     Sleep apnea    CPAP   Vaginal yeast infection    Wears dentures    partial upper    Past Surgical History:  Procedure Laterality Date   ABDOMINAL HYSTERECTOMY  09/08/1996   BACK SURGERY  1990/2014   CATARACT EXTRACTION, BILATERAL  09/08/2012   COLONOSCOPY WITH PROPOFOL N/A 03/25/2016   Procedure: COLONOSCOPY WITH PROPOFOL;  Surgeon: Midge Minium, MD;  Location: [facility] ENDOSCOPY;  Service: Endoscopy;  Laterality: N/A;   deviated nose septum surgery  09/08/1998   ESOPHAGOGASTRODUODENOSCOPY (EGD) WITH PROPOFOL N/A 03/25/2016   Procedure: ESOPHAGOGASTRODUODENOSCOPY (EGD) WITH PROPOFOL;  Surgeon: Midge Minium, MD;  Location: [facility] ENDOSCOPY;  Service: Endoscopy;  Laterality: N/A;   EYE SURGERY     LITHOTRIPSY  09/08/2010   TOTAL HIP ARTHROPLASTY Left     Prior to Admission medications   Medication Sig Start Date End Date Taking? Authorizing Provider  atorvastatin (LIPITOR) 20 MG tablet TAKE 1 TABLET(20 MG) BY MOUTH AT BEDTIME 03/07/16  Yes Ellyn Hack, MD  Biotin 1 MG CAPS Take 1 capsule by mouth daily.   Yes [provider]  CALCIUM PO Take by mouth.   Yes [provider]  cetirizine (ZYRTEC) 10 MG tablet Take 1 tablet (10 mg total) by mouth at bedtime. 09/05/16  Yes Alfonse Spruce, MD  clotrimazole-betamethasone (LOTRISONE) cream Apply 1 Application topically 2 (two) times daily. 10/30/23  Yes Pardue, Monico Blitz, DO  DULoxetine (CYMBALTA) 60 MG capsule Take 1 capsule (60 mg total) by mouth daily. Reported on  12/26/2015 05/07/23  Yes Alyson Reedy, FNP  fluticasone (FLONASE) 50 MCG/ACT nasal spray Place 2 sprays into both nostrils daily. 07/07/23  Yes Birder Robson, MD  Gabapentin, Once-Daily, 300 MG TABS Take 0.5 tablets (150 mg total) by mouth at bedtime. 07/20/23  Yes Pardue, Sarah N, DO  ipratropium (ATROVENT) 0.03 % nasal spray PLACE 2 SPRAYS INTO BOTH NOSTRILS EVERY 12 HOURS USE IN PLACE OF FLONASE 12/08/23  Yes Pardue, Monico Blitz, DO  levothyroxine (SYNTHROID) 75  MCG tablet Take 1 tablet (75 mcg total) by mouth daily before breakfast. 05/07/23  Yes Alyson Reedy, FNP  meloxicam (MOBIC) 15 MG tablet Take 1 tablet by mouth daily.   Yes [provider]  montelukast (SINGULAIR) 10 MG tablet Take 1 tablet (10 mg total) by mouth at bedtime. 07/07/23  Yes Birder Robson, MD  nystatin (MYCOSTATIN/NYSTOP) powder Apply 1 Application topically 3 (three) times daily. 05/07/23  Yes Alyson Reedy, FNP  omeprazole (PRILOSEC) 20 MG capsule TAKE 1 CAPSULE(20 MG) BY MOUTH TWICE DAILY BEFORE A MEAL 09/24/23  Yes Pardue, Monico Blitz, DO  Probiotic Product (PROBIOTIC PO) Take by mouth.   Yes [provider]  topiramate (TOPAMAX) 50 MG tablet Take 1 tablet (50 mg total) by mouth daily. 07/20/23  Yes Pardue, Monico Blitz, DO  traZODone (DESYREL) 50 MG tablet Take 0.5-1 tablets (25-50 mg total) by mouth at bedtime as needed for sleep. 11/24/23 01/23/24 Yes Hisada, Barbee Cough, MD  trimethoprim (TRIMPEX) 100 MG tablet Take 1 tablet (100 mg total) by mouth daily. 07/27/23  Yes MacDiarmid, Lorin Picket, MD  albuterol (VENTOLIN HFA) 108 (90 Base) MCG/ACT inhaler Inhale 1-2 puffs into the lungs every 6 (six) hours as needed for wheezing or shortness of breath. 07/07/23   Birder Robson, MD  budesonide-formoterol (SYMBICORT) 160-4.5 MCG/ACT inhaler Inhale 2 puffs into the lungs 2 (two) times daily. 10/16/23   Sherlyn Hay, DO  ipratropium-albuterol (DUONEB) 0.5-2.5 (3) MG/3ML SOLN Take 3 mLs by nebulization every 4 (four) hours as needed. 10/16/23   Sherlyn Hay, DO    Allergies as of 11/16/2023 - Review Complete 11/03/2023  Allergen Reaction Noted   Morphine Hives 12/11/2014    Family History  Problem Relation Age of Onset   Depression Mother    Asthma Mother    Heart disease Mother    Diabetes Mother    Cancer Father        lung cancer   COPD Father    Liver cancer Father    Cancer Sister        Brain Cancer   Allergic rhinitis Sister    Asthma Sister    Depression  Daughter    Hematuria Daughter    Bladder Cancer Neg Hx     Social History   Socioeconomic History   Marital status: Divorced    Spouse name: Not on file   Number of children: 3   Years of education: Not on file   Highest education level: Some college, no degree  Occupational History   Occupation: retired  Tobacco Use   Smoking status: Never    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)   Smokeless tobacco: Never  Vaping Use   Vaping status: Never Used  Substance and Sexual Activity   Alcohol use: No   Drug use: No   Sexual activity: Never    Birth control/protection: Surgical  Other Topics Concern   Not on file  Social History Narrative   Not on file   Social Drivers of Health  Financial Resource Strain: Low Risk  (08/25/2023)   Overall Financial Resource Strain (CARDIA)    Difficulty of Paying Living Expenses: Not hard at all  Food Insecurity: No Food Insecurity (10/21/2023)   Hunger Vital Sign    Worried About Running Out of Food in the Last Year: Never true    Ran Out of Food in the Last Year: Never true  Transportation Needs: No Transportation Needs (10/21/2023)   PRAPARE - Administrator, Civil Service (Medical): No    Lack of Transportation (Non-Medical): No  Physical Activity: Insufficiently Active (08/25/2023)   Exercise Vital Sign    Days of Exercise per Week: 1 day    Minutes of Exercise per Session: 10 min  Stress: No Stress Concern Present (08/25/2023)   [employer] of Occupational Health - Occupational Stress Questionnaire    Feeling of Stress : Only a little  Social Connections: Socially Isolated (08/25/2023)   Social Connection and Isolation Panel [NHANES]    Frequency of Communication with Friends and Family: More than three times a week    Frequency of Social Gatherings with Friends and Family: Three times a week    Attends Religious Services: Never    Active Member of Clubs or Organizations: No    Attends Banker Meetings:  Never    Marital Status: Divorced  Catering manager Violence: Not At Risk (10/21/2023)   Humiliation, Afraid, Rape, and Kick questionnaire    Fear of Current or Ex-Partner: No    Emotionally Abused: No    Physically Abused: No    Sexually Abused: No    Review of Systems: See HPI, otherwise negative ROS  Physical Exam: BP (!) 149/68   Pulse 88   Temp (!) 96.5 F (35.8 C) (Temporal)   Resp 20   Ht 5\' 2"  (1.575 m)   Wt 111.1 kg   SpO2 99%   BMI 44.81 kg/m  General:   Alert,  pleasant and cooperative in NAD Head:  Normocephalic and atraumatic. Neck:  Supple; no masses or thyromegaly. Lungs:  Clear throughout to auscultation.    Heart:  Regular rate and rhythm. Abdomen:  Soft, nontender and nondistended. Normal bowel sounds, without guarding, and without rebound.   Neurologic:  Alert and  oriented x4;  grossly normal neurologically.  Impression/Plan: Disha Cottam is here for an colonoscopy to be performed for heme positive stools  Risks, benefits, limitations, and alternatives regarding  colonoscopy have been reviewed with the patient.  Questions have been answered.  All parties agreeable.   Midge Minium, MD  12/17/2023, 9:22 AM

## 2023-12-17 NOTE — Op Note (Signed)
 [facility] Patient Name: Faith Padilla Procedure Date: 12/17/2023 9:46 AM MRN: [id number] Account #: [id number] Date of Birth: 07-Jan-1951 Admit Type: Outpatient Age: 73 Room: [facility] ENDO ROOM 4 Gender: Female Note Status: Finalized Instrument Name: Prentice Docker [id number] Procedure:             Colonoscopy Indications:           Heme positive stool Providers:             Midge Minium MD, MD Referring MD:          Sherlyn Hay (Referring MD) Medicines:             Propofol per Anesthesia Complications:         No immediate complications. Procedure:             Pre-Anesthesia Assessment:                        - Prior to the procedure, a History and Physical was                         performed, and patient medications and allergies were                         reviewed. The patient's tolerance of previous                         anesthesia was also reviewed. The risks and benefits                         of the procedure and the sedation options and risks                         were discussed with the patient. All questions were                         answered, and informed consent was obtained. Prior                         Anticoagulants: The patient has taken no anticoagulant                         or antiplatelet agents. ASA Grade Assessment: II - A                         patient with mild systemic disease. After reviewing                         the risks and benefits, the patient was deemed in                         satisfactory condition to undergo the procedure.                        After obtaining informed consent, the colonoscope was                         passed under direct vision. Throughout the procedure,  the patient's blood pressure, pulse, and oxygen                         saturations were monitored continuously. The                         Colonoscope was introduced through the anus and                          advanced to the the cecum, identified by appendiceal                         orifice and ileocecal valve. The colonoscopy was                         performed without difficulty. The patient tolerated                         the procedure well. The quality of the bowel                         preparation was excellent. Findings:      The perianal and digital rectal examinations were normal.      Multiple small-mouthed diverticula were found in the sigmoid colon and       descending colon.      Two sessile polyps were found in the descending colon. The polyps were 3       to 6 mm in size. These polyps were removed with a cold snare. Resection       and retrieval were complete.      Non-bleeding internal hemorrhoids were found during retroflexion. The       hemorrhoids were Grade II (internal hemorrhoids that prolapse but reduce       spontaneously). Impression:            - Diverticulosis in the sigmoid colon and in the                         descending colon.                        - Two 3 to 6 mm polyps in the descending colon,                         removed with a cold snare. Resected and retrieved.                        - Non-bleeding internal hemorrhoids. Recommendation:        - Discharge patient to home.                        - Resume previous diet.                        - Continue present medications.                        - Await pathology results.                        - Repeat  colonoscopy is not recommended for                         surveillance. Procedure Code(s):     --- Professional ---                        [phone], Colonoscopy, flexible; with removal of                         tumor(s), polyp(s), or other lesion(s) by snare                         technique Diagnosis Code(s):     --- Professional ---                        R19.5, Other fecal abnormalities                        D12.4, Benign neoplasm of descending colon CPT copyright 2022 American  Medical Association. All rights reserved. The codes documented in this report are preliminary and upon coder review may  be revised to meet current compliance requirements. Midge Minium MD, MD 12/17/2023 10:13:37 AM This report has been signed electronically. Number of Addenda: 0 Note Initiated On: 12/17/2023 9:46 AM Scope Withdrawal Time: 0 hours 6 minutes 26 seconds  Total Procedure Duration: 0 hours 12 minutes 1 second  Estimated Blood Loss:  Estimated blood loss: none.      [facility]

## 2023-12-17 NOTE — Anesthesia Preprocedure Evaluation (Signed)
 Anesthesia Evaluation  Patient identified by MRN, date of birth, ID band Patient awake    Reviewed: Allergy & Precautions, NPO status , Patient's Chart, lab work & pertinent test results  Airway Mallampati: III  TM Distance: <3 FB Neck ROM: full  Mouth opening: Limited Mouth Opening  Dental  (+) Partial Upper   Pulmonary neg pulmonary ROS, shortness of breath and with exertion, asthma , sleep apnea  Pulmonary fibrosis   Pulmonary exam normal  + decreased breath sounds      Cardiovascular Exercise Tolerance: Good negative cardio ROS Normal cardiovascular exam Rhythm:Regular Rate:Normal     Neuro/Psych  Headaches  Anxiety     negative neurological ROS  negative psych ROS   GI/Hepatic negative GI ROS, Neg liver ROS, hiatal hernia,GERD  Medicated,,  Endo/Other  negative endocrine ROSHypothyroidism  Class 3 obesity  Renal/GU Renal diseasenegative Renal ROS  negative genitourinary   Musculoskeletal   Abdominal  (+) + obese  Peds negative pediatric ROS (+)  Hematology negative hematology ROS (+)   Anesthesia Other Findings Past Medical History: No date: Abdominal pain, right upper quadrant 03/12/2016: Abdominal pain, RUQ (right upper quadrant) 06/25/2023: Achilles tendinitis of right lower extremity No date: Acid reflux 06/29/2017: Allergic conjunctivitis of both eyes 12/04/2019: Anemia No date: Anxiety and depression No date: Asthma 08/13/2021: 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) No date: Complication of anesthesia     Comment:  slow to awaken, wakes coughing and SOB No date: COPD (chronic obstructive pulmonary disease) (HCC) No date: Depression 08/15/2021: 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 No date: Diverticulitis 08/13/2021: Dry eye syndrome of both eyes 08/13/2021: Dyspnea on exertion 11/15/2021: Family history of problems related to  stress 06/25/2022: Frequency of urination and polyuria 08/15/2021: GAD (generalized anxiety disorder) No date: Glaucoma 06/25/2022: H/O urinary incontinence 06/25/2023: Hereditary peripheral neuropathy 08/02/2018: High risk medication use No date: HLD (hyperlipidemia) 12/04/2019: Iron deficiency 12/07/2021: Isolation (social) No date: Kidney stones No date: Microscopic hematuria No date: Mixed incontinence No date: Morbid obesity (HCC) 06/26/2016: Ocular hypertension, bilateral 08/02/2018: Ocular migraine No date: Osteoarthritis     Comment:  lumbar spine 08/28/2023: Osteoarthritis of right knee 08/13/2021: Palpitations 06/25/2023: Plantar fasciitis 12/07/2021: Post covid-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, unspecified 03/30/2020: Postoperative follow-up 08/14/2015: Prediabetes 06/26/2016: Pseudophakia, both eyes 10/17/2021: Rectal bleeding No date: Shortness of breath dyspnea No date: Sleep apnea     Comment:  CPAP No date: Vaginal yeast infection No date: Wears dentures     Comment:  partial upper  Past Surgical History: 09/08/1996: ABDOMINAL HYSTERECTOMY 1990/2014: BACK SURGERY 09/08/2012: CATARACT EXTRACTION, BILATERAL 03/25/2016: COLONOSCOPY WITH PROPOFOL; N/A     Comment:  Procedure: COLONOSCOPY WITH PROPOFOL;  Surgeon: Midge Minium, MD;  Location: [facility] ENDOSCOPY;  Service: Endoscopy;              Laterality: N/A; 09/08/1998: deviated nose septum surgery 03/25/2016: ESOPHAGOGASTRODUODENOSCOPY (EGD) WITH PROPOFOL; N/A     Comment:  Procedure: ESOPHAGOGASTRODUODENOSCOPY (EGD) WITH               PROPOFOL;  Surgeon: Midge Minium, MD;  Location: [facility]               ENDOSCOPY;  Service: Endoscopy;  Laterality: N/A; No date: EYE SURGERY No date: JOINT REPLACEMENT 09/08/2010: LITHOTRIPSY No date: TOTAL HIP ARTHROPLASTY; Left  BMI    Body Mass Index: 44.81 kg/m      Reproductive/Obstetrics negative OB  ROS                             Anesthesia  Physical Anesthesia Plan  ASA: 3  Anesthesia Plan: General   Post-op Pain Management:    Induction: Intravenous  PONV Risk Score and Plan: Propofol infusion and TIVA  Airway Management Planned: Natural Airway and Nasal Cannula  Additional Equipment:   Intra-op Plan:   Post-operative Plan:   Informed Consent: I have reviewed the patients History and Physical, chart, labs and discussed the procedure including the risks, benefits and alternatives for the proposed anesthesia with the patient or authorized representative who has indicated his/her understanding and acceptance.     Dental Advisory Given  Plan Discussed with: CRNA  Anesthesia Plan Comments:        Anesthesia Quick Evaluation

## 2023-12-17 NOTE — Transfer of Care (Signed)
 Immediate Anesthesia Transfer of Care Note  Patient: Faith Padilla  Procedure(s) Performed: COLONOSCOPY POLYPECTOMY, INTESTINE  Patient Location: Endoscopy Unit  Anesthesia Type:General  Level of Consciousness: drowsy  Airway & Oxygen Therapy: Patient Spontanous Breathing and Patient connected to face mask oxygen  Post-op Assessment: Report given to RN and Post -op Vital signs reviewed and stable  Post vital signs: Reviewed and stable  Last Vitals:  Vitals Value Taken Time  BP 128/76 1013  Temp 35.8 1013  Pulse 80 12/17/23 1013  Resp 22 12/17/23 1013  SpO2 100 % 12/17/23 1013  Vitals shown include unfiled device data.  Last Pain:  Vitals:   12/17/23 1013  TempSrc: (P) Temporal  PainSc:          Complications: No notable events documented.

## 2023-12-17 NOTE — Anesthesia Postprocedure Evaluation (Signed)
 Anesthesia Post Note  Patient: Faith Padilla  Procedure(s) Performed: COLONOSCOPY POLYPECTOMY, INTESTINE  Patient location during evaluation: PACU Anesthesia Type: General Level of consciousness: awake and awake and alert Pain management: satisfactory to patient Vital Signs Assessment: post-procedure vital signs reviewed and stable Respiratory status: spontaneous breathing Cardiovascular status: stable Anesthetic complications: no   No notable events documented.   Last Vitals:  Vitals:   12/17/23 1023 12/17/23 1033  BP: 126/66 (!) 140/61  Pulse: 74 63  Resp: (!) 23 14  Temp:    SpO2: 100% 100%    Last Pain:  Vitals:   12/17/23 1033  TempSrc:   PainSc: 0-No pain                 VAN STAVEREN,Caldonia Leap

## 2023-12-18 ENCOUNTER — Encounter: Payer: Self-pay | Admitting: Gastroenterology

## 2023-12-18 LAB — SURGICAL PATHOLOGY

## 2023-12-21 ENCOUNTER — Encounter

## 2023-12-21 ENCOUNTER — Ambulatory Visit (INDEPENDENT_AMBULATORY_CARE_PROVIDER_SITE_OTHER): Payer: Self-pay | Admitting: Professional Counselor

## 2023-12-21 ENCOUNTER — Encounter: Payer: Self-pay | Admitting: Gastroenterology

## 2023-12-21 DIAGNOSIS — F4310 Post-traumatic stress disorder, unspecified: Secondary | ICD-10-CM
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
## 2023-12-23 ENCOUNTER — Other Ambulatory Visit: Payer: Self-pay | Admitting: Family Medicine

## 2023-12-23 ENCOUNTER — Encounter: Admitting: *Deleted

## 2023-12-23 DIAGNOSIS — Z713 Dietary counseling and surveillance: Secondary | ICD-10-CM

## 2023-12-23 DIAGNOSIS — J8410 Pulmonary fibrosis, unspecified: Secondary | ICD-10-CM

## 2023-12-23 DIAGNOSIS — K219 Gastro-esophageal reflux disease without esophagitis: Secondary | ICD-10-CM

## 2023-12-23 DIAGNOSIS — R0600 Dyspnea, unspecified: Secondary | ICD-10-CM

## 2023-12-23 NOTE — Progress Notes (Signed)
 Daily Session Note  Patient Details  Name: Faith Padilla MRN: [id number] Date of Birth: 06-07-1951 Referring Provider:   Flowsheet Row Pulmonary Rehab from 08/25/2023 in [facility] Cardiac and Pulmonary Rehab  Referring Provider Dr. Quillian Brunt       Encounter Date: 12/23/2023  Check In:  Session Check In - 12/23/23 1356       Check-In   Supervising physician immediately available to respond to emergencies See telemetry face sheet for immediately available ER MD    Location [facility]-Cardiac & Pulmonary Rehab    Staff Present Sue Em RN,BSN;Kelly Sabra Cramp BS, ACSM CEP, Exercise Physiologist;Noah Tickle, BS, Exercise Physiologist    Virtual Visit No    Medication changes reported     No    Fall or balance concerns reported    No    Warm-up and Cool-down Performed on first and last piece of equipment    Resistance Training Performed Yes    VAD Patient? No    PAD/SET Patient? No      Pain Assessment   Currently in Pain? No/denies                Social History   Tobacco Use  Smoking Status Never   Passive exposure: Never (Parent)  Smokeless Tobacco Never    Goals Met:  Independence with exercise equipment Exercise tolerated well No report of concerns or symptoms today Strength training completed today  Goals Unmet:  Not Applicable  Comments: Pt able to follow exercise prescription today without complaint.  Will continue to monitor for progression.    Dr. Firman Hughes is Medical Director for [facility] Cardiac Rehabilitation.  Dr. Fuad Aleskerov is Medical Director for [facility] Pulmonary Rehabilitation.

## 2023-12-24 ENCOUNTER — Encounter: Admitting: *Deleted

## 2023-12-24 VITALS — Ht 62.1 in | Wt 246.1 lb

## 2023-12-24 DIAGNOSIS — R0600 Dyspnea, unspecified: Secondary | ICD-10-CM

## 2023-12-24 DIAGNOSIS — J8410 Pulmonary fibrosis, unspecified: Secondary | ICD-10-CM | POA: Diagnosis not present

## 2023-12-24 NOTE — Telephone Encounter (Signed)
 Requested Prescriptions  Pending Prescriptions Disp Refills   topiramate (TOPAMAX) 50 MG tablet [Pharmacy Med Name: TOPIRAMATE 50MG  TABLETS] 90 tablet 1    Sig: TAKE 1 TABLET(50 MG) BY MOUTH DAILY     Neurology: Anticonvulsants - topiramate & zonisamide Passed - 12/24/2023 10:08 AM      Passed - Cr in normal range and within 360 days    Creatinine  Date Value Ref Range Status  08/30/2014 1.26 0.60 - 1.30 mg/dL Final   Creatinine, Ser  Date Value Ref Range Status  07/20/2023 1.00 0.57 - 1.00 mg/dL Final         Passed - CO2 in normal range and within 360 days    CO2  Date Value Ref Range Status  07/20/2023 22 20 - 29 mmol/L Final   Co2  Date Value Ref Range Status  08/30/2014 22 21 - 32 mmol/L Final         Passed - ALT in normal range and within 360 days    ALT  Date Value Ref Range Status  05/07/2023 9 0 - 32 IU/L Final   SGPT (ALT)  Date Value Ref Range Status  07/24/2014 20 U/L Final    Comment:    14-63 NOTE: New Reference Range 03/28/14          Passed - AST in normal range and within 360 days    AST  Date Value Ref Range Status  05/07/2023 15 0 - 40 IU/L Final   SGOT(AST)  Date Value Ref Range Status  07/24/2014 15 15 - 37 Unit/L Final         Passed - Completed PHQ-2 or PHQ-9 in the last 360 days      Passed - Valid encounter within last 12 months    Recent Outpatient Visits           2 months ago Dehydration   [facility] [place], Asencion Blacksmith, DO   2 months ago Acute cough   [facility] [place], Asencion Blacksmith, DO       Future Appointments             In 1 month MacDiarmid, Geralyn Knee, MD [facility] [place]             omeprazole (PRILOSEC) 20 MG capsule [Pharmacy Med Name: OMEPRAZOLE 20MG  CAPSULES] 180 capsule 0    Sig: TAKE 1 CAPSULE(20 MG) BY MOUTH TWICE DAILY BEFORE A MEAL     Gastroenterology: Proton Pump Inhibitors Passed - 12/24/2023 10:08 AM      Passed - Valid encounter  within last 12 months    Recent Outpatient Visits           2 months ago Dehydration   [facility] Carlean Charter, DO   2 months ago Acute cough   [facility] [place], Asencion Blacksmith, DO       Future Appointments             In 1 month MacDiarmid, Geralyn Knee, [facility]

## 2023-12-24 NOTE — Progress Notes (Signed)
 Daily Session Note  Patient Details  Name: Faith Padilla MRN: [id number] Date of Birth: 07-26-1951 Referring Provider:   Flowsheet Row Pulmonary Rehab from 08/25/2023 in [facility] Cardiac and Pulmonary Rehab  Referring Provider Dr. Quillian Brunt       Encounter Date: 12/24/2023  Check In:  Session Check In - 12/24/23 1402       Check-In   Supervising physician immediately available to respond to emergencies See telemetry face sheet for immediately available ER MD    Location [facility]-Cardiac & Pulmonary Rehab    Staff Present Sue Em RN,BSN;Maxon Conetta BS, Exercise Physiologist;Laureen Bevin Bucks, BS, RRT, CPFT;Noah Tickle, BS, Exercise Physiologist    Virtual Visit No    Medication changes reported     No    Warm-up and Cool-down Performed on first and last piece of equipment    Resistance Training Performed Yes    VAD Patient? No    PAD/SET Patient? No      Pain Assessment   Currently in Pain? No/denies                Social History   Tobacco Use  Smoking Status Never   Passive exposure: Never (Parent)  Smokeless Tobacco Never    Goals Met:  Independence with exercise equipment Exercise tolerated well No report of concerns or symptoms today Strength training completed today  Goals Unmet:  Not Applicable  Comments: Pt able to follow exercise prescription today without complaint.  Will continue to monitor for progression.    Dr. Firman Hughes is Medical Director for [facility] Cardiac Rehabilitation.  Dr. Fuad Aleskerov is Medical Director for [facility] Pulmonary Rehabilitation.

## 2023-12-24 NOTE — Patient Instructions (Signed)
 Discharge Patient Instructions  Patient Details  Name: Faith Padilla MRN: [id number] Date of Birth: 1951-05-28 Referring Provider:  Sherlyn Hay, DO   Number of Visits: 3  Reason for Discharge:  Patient reached a stable level of exercise. Patient independent in their exercise. Patient has met program and personal goals.   Diagnosis:  Pulmonary fibrosis (HCC)  Dyspnea, unspecified type  Initial Exercise Prescription:  Initial Exercise Prescription - 08/25/23 1500       Date of Initial Exercise RX and Referring Provider   Date 08/25/23    Referring Provider Dr. Luciano Cutter      Oxygen   Oxygen Continuous    Liters 1L    Maintain Oxygen Saturation 88% or higher      NuStep   Level 1    SPM 80    Minutes 15    METs 1.3      Arm Ergometer   Level 1    Watts 25    RPM 50    Minutes 15    METs 1.3      REL-XR   Level 1    Speed 50    Minutes 15    METs 1.3      Track   Laps 10    Minutes 15    METs 1.54      Prescription Details   Duration Progress to 30 minutes of continuous aerobic without signs/symptoms of physical distress      Intensity   THRR 40-80% of Max Heartrate 110-135    Ratings of Perceived Exertion 11-13    Perceived Dyspnea 0-4      Progression   Progression Continue to progress workloads to maintain intensity without signs/symptoms of physical distress.      Resistance Training   Training Prescription Yes    Weight 2lb    Reps 10-15             Discharge Exercise Prescription (Final Exercise Prescription Changes):  Exercise Prescription Changes - 12/17/23 1300       Response to Exercise   Blood Pressure (Admit) 106/64    Blood Pressure (Exit) 126/70    Heart Rate (Admit) 101 bpm    Heart Rate (Exercise) 126 bpm    Heart Rate (Exit) 97 bpm    Oxygen Saturation (Admit) 93 %    Oxygen Saturation (Exercise) 90 %    Oxygen Saturation (Exit) 92 %    Rating of Perceived Exertion (Exercise) 15    Perceived Dyspnea  (Exercise) 3    Symptoms none    Duration Continue with 30 min of aerobic exercise without signs/symptoms of physical distress.    Intensity THRR unchanged      Progression   Progression Continue to progress workloads to maintain intensity without signs/symptoms of physical distress.    Average METs 2.52      Resistance Training   Training Prescription Yes    Weight 2lb    Reps 10-15      Interval Training   Interval Training No      Oxygen   Oxygen Continuous    Liters 0-1L      Recumbant Bike   Level 5    Watts 30    Minutes 15      REL-XR   Level 5    Minutes 15    METs 3.7      T5 Nustep   Level 5    Minutes 15    METs 2.3  Track   Laps 21    Minutes 15    METs 2.14      Home Exercise Plan   Plans to continue exercise at Home (comment)   walking and hand weights   Frequency Add 2 additional days to program exercise sessions.    Initial Home Exercises Provided 10/14/23      Oxygen   Maintain Oxygen Saturation 88% or higher             Functional Capacity:  6 Minute Walk     Row Name 08/25/23 1514 12/24/23 1405       6 Minute Walk   Phase Initial Discharge    Distance 930 feet 1135 feet    Distance % Change -- 22 %    Distance Feet Change -- 205 ft    Walk Time 6 minutes --    # of Rest Breaks -- 0    MPH 1.76 2.15    METS 1.3 1.88    RPE 13 17    Perceived Dyspnea  1 3    VO2 Peak 4.5 6.6    Symptoms No No    Resting HR 86 bpm 90 bpm    Resting BP 106/68 118/72    Resting Oxygen Saturation  96 % 94 %    Exercise Oxygen Saturation  during 6 min walk 93 % 88 %    Max Ex. HR 102 bpm 117 bpm    Max Ex. BP 126/74 152/78    2 Minute Post BP 106/64 134/80      Interval HR   1 Minute HR 98 72    2 Minute HR 101 71    3 Minute HR 102 80    4 Minute HR 68 83    5 Minute HR 65 84    6 Minute HR 96 117    2 Minute Post HR 87 94    Interval Heart Rate? Yes Yes      Interval Oxygen   Interval Oxygen? Yes Yes    Baseline Oxygen  Saturation % 96 % 94 %    1 Minute Oxygen Saturation % 98 % 92 %    1 Minute Liters of Oxygen 1 L 0 L    2 Minute Oxygen Saturation % 93 % 91 %    2 Minute Liters of Oxygen 1 L 0 L    3 Minute Oxygen Saturation % 94 % 88 %    3 Minute Liters of Oxygen 1 L 0 L    4 Minute Oxygen Saturation % 93 % 92 %    4 Minute Liters of Oxygen 1 L 0 L    5 Minute Oxygen Saturation % 94 % 92 %    5 Minute Liters of Oxygen 1 L 0 L    6 Minute Oxygen Saturation % 97 % 90 %    6 Minute Liters of Oxygen 1 L 0 L    2 Minute Post Oxygen Saturation % 96 % 96 %    2 Minute Post Liters of Oxygen 1 L 0 L             Nutrition & Weight - Outcomes:  Pre Biometrics - 08/25/23 1520       Pre Biometrics   Height 5' 2.1" (1.577 m)    Weight 231 lb 12.8 oz (105.1 kg)    Waist Circumference 48 inches    Hip Circumference 51 inches    Waist to Hip Ratio 0.94 %  BMI (Calculated) 42.28    Single Leg Stand 3.5 seconds             Post Biometrics - 12/24/23 1412        Post  Biometrics   Height 5' 2.1" (1.577 m)    Weight 246 lb 1.6 oz (111.6 kg)    Waist Circumference 47.5 inches    Hip Circumference 50 inches    Waist to Hip Ratio 0.95 %    BMI (Calculated) 44.89    Single Leg Stand 15 seconds              Knowledge Questionnaire Score - 08/25/23 1525       Knowledge Questionnaire Score   Pre Score 14/18

## 2023-12-28 ENCOUNTER — Encounter: Admitting: *Deleted

## 2023-12-28 ENCOUNTER — Encounter

## 2023-12-28 DIAGNOSIS — J8410 Pulmonary fibrosis, unspecified: Secondary | ICD-10-CM | POA: Diagnosis not present

## 2023-12-28 DIAGNOSIS — R0600 Dyspnea, unspecified: Secondary | ICD-10-CM

## 2023-12-28 NOTE — Progress Notes (Signed)
 Daily Session Note  Patient Details  Name: Faith Padilla MRN: [id number] Date of Birth: 1951/03/23 Referring Provider:   Flowsheet Row Pulmonary Rehab from 08/25/2023 in [facility] Cardiac and Pulmonary Rehab  Referring Provider Dr. Quillian Brunt       Encounter Date: 12/28/2023  Check In:  Session Check In - 12/28/23 1403       Check-In   Supervising physician immediately available to respond to emergencies See telemetry face sheet for immediately available ER MD    Location [facility]-Cardiac & Pulmonary Rehab    Staff Present Sue Em RN,BSN;Susanne Bice, RN, BSN, CCRP;Noah Tickle, BS, Exercise Physiologist;Maxon Conetta BS, Exercise Physiologist    Virtual Visit No    Medication changes reported     No    Fall or balance concerns reported    No    Warm-up and Cool-down Performed on first and last piece of equipment    Resistance Training Performed Yes    VAD Patient? No    PAD/SET Patient? No      Pain Assessment   Currently in Pain? No/denies                Social History   Tobacco Use  Smoking Status Never   Passive exposure: Never (Parent)  Smokeless Tobacco Never    Goals Met:  Independence with exercise equipment Exercise tolerated well No report of concerns or symptoms today Strength training completed today  Goals Unmet:  Not Applicable  Comments: Pt able to follow exercise prescription today without complaint.  Will continue to monitor for progression.    Dr. Firman Hughes is Medical Director for [facility] Cardiac Rehabilitation.  Dr. Fuad Aleskerov is Medical Director for [facility] Pulmonary Rehabilitation.

## 2023-12-30 ENCOUNTER — Encounter: Admitting: *Deleted

## 2023-12-30 DIAGNOSIS — J8410 Pulmonary fibrosis, unspecified: Secondary | ICD-10-CM | POA: Diagnosis not present

## 2023-12-30 DIAGNOSIS — R0600 Dyspnea, unspecified: Secondary | ICD-10-CM

## 2023-12-30 NOTE — Progress Notes (Signed)
 Daily Session Note  Patient Details  Name: Faith Padilla MRN: [id number] Date of Birth: 12/22/1950 Referring Provider:   Flowsheet Row Pulmonary Rehab from 08/25/2023 in [facility] Cardiac and Pulmonary Rehab  Referring Provider Dr. Quillian Brunt       Encounter Date: 12/30/2023  Check In:  Session Check In - 12/30/23 1411       Check-In   Supervising physician immediately available to respond to emergencies See telemetry face sheet for immediately available ER MD    Location [facility]-Cardiac & Pulmonary Rehab    Staff Present Sue Em RN,BSN;Susanne Bice, RN, BSN, CCRP;Noah Tickle, BS, Exercise Physiologist;Kelly Dawne Euler, ACSM CEP, Exercise Physiologist    Virtual Visit No    Medication changes reported     No    Fall or balance concerns reported    No    Warm-up and Cool-down Performed on first and last piece of equipment    Resistance Training Performed Yes    VAD Patient? No    PAD/SET Patient? No      Pain Assessment   Currently in Pain? No/denies                Social History   Tobacco Use  Smoking Status Never   Passive exposure: Never (Parent)  Smokeless Tobacco Never    Goals Met:  Independence with exercise equipment Exercise tolerated well No report of concerns or symptoms today Strength training completed today  Goals Unmet:  Not Applicable  Comments: Pt able to follow exercise prescription today without complaint.  Will continue to monitor for progression.    Dr. Firman Hughes is Medical Director for [facility] Cardiac Rehabilitation.  Dr. Fuad Aleskerov is Medical Director for [facility] Pulmonary Rehabilitation.

## 2023-12-31 ENCOUNTER — Encounter: Admitting: *Deleted

## 2023-12-31 DIAGNOSIS — J8410 Pulmonary fibrosis, unspecified: Secondary | ICD-10-CM

## 2023-12-31 DIAGNOSIS — R0600 Dyspnea, unspecified: Secondary | ICD-10-CM

## 2023-12-31 NOTE — Progress Notes (Signed)
 Daily Session Note  Patient Details  Name: Faith Padilla MRN: [id number] Date of Birth: Mar 18, 1951 Referring Provider:   Flowsheet Row Pulmonary Rehab from 08/25/2023 in [facility] Cardiac and Pulmonary Rehab  Referring Provider Dr. Quillian Brunt       Encounter Date: 12/31/2023  Check In:  Session Check In - 12/31/23 1401       Check-In   Supervising physician immediately available to respond to emergencies See telemetry face sheet for immediately available ER MD    Location [facility]-Cardiac & Pulmonary Rehab    Staff Present Sue Em RN,BSN;[facility] BS, Exercise Physiologist;Noah Tickle, BS, Exercise Physiologist    Virtual Visit No    Medication changes reported     No    Fall or balance concerns reported    No    Warm-up and Cool-down Performed on first and last piece of equipment    Resistance Training Performed Yes    VAD Patient? No    PAD/SET Patient? No      Pain Assessment   Currently in Pain? No/denies                Social History   Tobacco Use  Smoking Status Never   Passive exposure: Never (Parent)  Smokeless Tobacco Never    Goals Met:  Independence with exercise equipment Exercise tolerated well No report of concerns or symptoms today Strength training completed today  Goals Unmet:  Not Applicable  Comments: Pt able to follow exercise prescription today without complaint.  Will continue to monitor for progression.    Dr. Firman Hughes is Medical Director for [facility] Cardiac Rehabilitation.  Dr. Fuad Aleskerov is Medical Director for [facility] Pulmonary Rehabilitation.

## 2024-01-01 ENCOUNTER — Telehealth (HOSPITAL_BASED_OUTPATIENT_CLINIC_OR_DEPARTMENT_OTHER): Payer: Self-pay

## 2024-01-01 NOTE — Telephone Encounter (Signed)
 I attempted to call patient. No answer. Left brief voicemail to call our office back.  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  Her diagnosis of OSA has not been formally addressed in our clinic.  We are happy to assist with this but will likely need the following information sent to us : Who managed her OSA and CPAP in the past? When and where was her last sleep study?  We will need a copy of all available sleep studies for our office records. Depending on insurance requirements she may need an office visit before we can order supplies - I would go ahead and schedule a visit if she calls back to expedite above

## 2024-01-01 NOTE — Telephone Encounter (Signed)
 Copied from CRM [id number]. Topic: Clinical - Medication Refill >> Jan 01, 2024 10:58 AM Justina Oman C wrote: Patient states cannot have cpap supplies until they received prescription to [employer] fax# [phone]. Please advise and call back [phone].
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
## 2024-01-04 ENCOUNTER — Encounter: Admitting: *Deleted

## 2024-01-04 DIAGNOSIS — J8410 Pulmonary fibrosis, unspecified: Secondary | ICD-10-CM | POA: Diagnosis not present

## 2024-01-04 DIAGNOSIS — R0600 Dyspnea, unspecified: Secondary | ICD-10-CM

## 2024-01-04 NOTE — Progress Notes (Signed)
 Daily Session Note  Patient Details  Name: Faith Padilla MRN: [id number] Date of Birth: 1951-07-06 Referring Provider:   Flowsheet Row Pulmonary Rehab from 08/25/2023 in [facility]  Referring Provider Dr. Quillian Brunt       Encounter Date: 01/04/2024  Check In:  Session Check In - 01/04/24 1402       Check-In   Supervising physician immediately available to respond to emergencies See telemetry face sheet for immediately available ER MD    Location [facility]    Staff Present Sue Em RN,BSN;Susanne Bice, RN, BSN, CCRP;Maxon Conetta BS, Exercise Physiologist;Noah Tickle, BS, Exercise Physiologist    Virtual Visit No    Medication changes reported     No    Fall or balance concerns reported    No    Warm-up and Cool-down Performed on first and last piece of equipment    Resistance Training Performed Yes    VAD Patient? No      Pain Assessment   Currently in Pain? No/denies                Social History   Tobacco Use  Smoking Status Never   Passive exposure: Never (Parent)  Smokeless Tobacco Never    Goals Met:  Independence with exercise equipment Exercise tolerated well No report of concerns or symptoms today Strength training completed today  Goals Unmet:  Not Applicable  Comments:  Faith Padilla graduated today from  rehab with 36 sessions completed.  Details of the patient's exercise prescription and what She needs to do in order to continue the prescription and progress were discussed with patient.  Patient was given a copy of prescription and goals.  Patient verbalized understanding. Faith Padilla plans to continue to exercise by walking.    Dr. Firman Hughes is Medical Director for [facility] Card[facility].  Dr. Fuad Aleskerov is Medical Director for [facility] Pul[facility] Rehabilitation.

## 2024-01-04 NOTE — Progress Notes (Signed)
 Discharge Summary   Faith Padilla DOB: [birth year]  Faith Padilla graduated today from  rehab with 36 sessions completed.  Details of the patient's exercise prescription and what She needs to do in order to continue the prescription and progress were discussed with patient.  Patient was given a copy of prescription and goals.  Patient verbalized understanding. Keasha plans to continue to exercise by walking.   6 Minute Walk     Row Name 08/25/23 1514 12/24/23 1405       6 Minute Walk   Phase Initial Discharge    Distance 930 feet 1135 feet    Distance % Change -- 22 %    Distance Feet Change -- 205 ft    Walk Time 6 minutes --    # of Rest Breaks -- 0    MPH 1.76 2.15    METS 1.3 1.88    RPE 13 17    Perceived Dyspnea  1 3    VO2 Peak 4.5 6.6    Symptoms No No    Resting HR 86 bpm 90 bpm    Resting BP 106/68 118/72    Resting Oxygen Saturation  96 % 94 %    Exercise Oxygen Saturation  during 6 min walk 93 % 88 %    Max Ex. HR 102 bpm 117 bpm    Max Ex. BP 126/74 152/78    2 Minute Post BP 106/64 134/80      Interval HR   1 Minute HR 98 72    2 Minute HR 101 71    3 Minute HR 102 80    4 Minute HR 68 83    5 Minute HR 65 84    6 Minute HR 96 117    2 Minute Post HR 87 94    Interval Heart Rate? Yes Yes      Interval Oxygen   Interval Oxygen? Yes Yes    Baseline Oxygen Saturation % 96 % 94 %    1 Minute Oxygen Saturation % 98 % 92 %    1 Minute Liters of Oxygen 1 L 0 L    2 Minute Oxygen Saturation % 93 % 91 %    2 Minute Liters of Oxygen 1 L 0 L    3 Minute Oxygen Saturation % 94 % 88 %    3 Minute Liters of Oxygen 1 L 0 L    4 Minute Oxygen Saturation % 93 % 92 %    4 Minute Liters of Oxygen 1 L 0 L    5 Minute Oxygen Saturation % 94 % 92 %    5 Minute Liters of Oxygen 1 L 0 L    6 Minute Oxygen Saturation % 97 % 90 %    6 Minute Liters of Oxygen 1 L 0 L    2 Minute Post Oxygen Saturation % 96 % 96 %    2 Minute Post Liters of Oxygen 1 L 0 L

## 2024-01-04 NOTE — Progress Notes (Signed)
 Pulmonary Individual Treatment Plan  Patient Details  Name: Faith Padilla MRN: [id number] Date of Birth: [birth year] Referring Provider:   Flowsheet Row Pulmonary Rehab from 08/25/2023 in [facility] Cardiac and Pulmonary Rehab  Referring Provider Dr. Quillian Brunt       Initial Encounter Date:  Flowsheet Row Pulmonary Rehab from 08/25/2023 in [facility] Cardiac and Pulmonary Rehab  Date 08/25/23       Visit Diagnosis: Pulmonary fibrosis (HCC)  Dyspnea, unspecified type  Patient's Home Medications on Admission:  Current Outpatient Medications:    albuterol  (VENTOLIN  HFA) 108 (90 Base) MCG/ACT inhaler, Inhale 1-2 puffs into the lungs every 6 (six) hours as needed for wheezing or shortness of breath., Disp: 18 g, Rfl: 1   atorvastatin  (LIPITOR) 20 MG tablet, TAKE 1 TABLET(20 MG) BY MOUTH AT BEDTIME, Disp: 90 tablet, Rfl: 0   Biotin 1 MG CAPS, Take 1 capsule by mouth daily., Disp: , Rfl:    budesonide -formoterol  (SYMBICORT ) 160-4.5 MCG/ACT inhaler, Inhale 2 puffs into the lungs 2 (two) times daily., Disp: 1 each, Rfl: 3   CALCIUM  PO, Take by mouth., Disp: , Rfl:    cetirizine  (ZYRTEC ) 10 MG tablet, Take 1 tablet (10 mg total) by mouth at bedtime., Disp: 30 tablet, Rfl: 3   clotrimazole -betamethasone  (LOTRISONE ) cream, Apply 1 Application topically 2 (two) times daily., Disp: 30 g, Rfl: 0   DULoxetine  (CYMBALTA ) 60 MG capsule, Take 1 capsule (60 mg total) by mouth daily. Reported on 12/26/2015, Disp: 90 capsule, Rfl: 3   fluticasone  (FLONASE ) 50 MCG/ACT nasal spray, Place 2 sprays into both nostrils daily., Disp: 16 g, Rfl: 5   Gabapentin , Once-Daily, 300 MG TABS, Take 0.5 tablets (150 mg total) by mouth at bedtime., Disp: 45 tablet, Rfl: 1   ipratropium (ATROVENT ) 0.03 % nasal spray, PLACE 2 SPRAYS INTO BOTH NOSTRILS EVERY 12 HOURS USE IN PLACE OF FLONASE , Disp: 30 mL, Rfl: 0   ipratropium-albuterol  (DUONEB) 0.5-2.5 (3) MG/3ML SOLN, Take 3 mLs by nebulization every 4 (four) hours as needed.,  Disp: 360 mL, Rfl: 3   levothyroxine  (SYNTHROID ) 75 MCG tablet, Take 1 tablet (75 mcg total) by mouth daily before breakfast., Disp: 90 tablet, Rfl: 3   meloxicam  (MOBIC ) 15 MG tablet, Take 1 tablet by mouth daily., Disp: , Rfl:    montelukast  (SINGULAIR ) 10 MG tablet, Take 1 tablet (10 mg total) by mouth at bedtime., Disp: 30 tablet, Rfl: 5   nystatin  (MYCOSTATIN /NYSTOP ) powder, Apply 1 Application topically 3 (three) times daily., Disp: 60 g, Rfl: 3   omeprazole  (PRILOSEC) 20 MG capsule, TAKE 1 CAPSULE(20 MG) BY MOUTH TWICE DAILY BEFORE A MEAL, Disp: 180 capsule, Rfl: 0   Probiotic Product (PROBIOTIC PO), Take by mouth., Disp: , Rfl:    topiramate  (TOPAMAX ) 50 MG tablet, TAKE 1 TABLET(50 MG) BY MOUTH DAILY, Disp: 90 tablet, Rfl: 0   traZODone  (DESYREL ) 50 MG tablet, Take 0.5-1 tablets (25-50 mg total) by mouth at bedtime as needed for sleep., Disp: 30 tablet, Rfl: 1   trimethoprim  (TRIMPEX ) 100 MG tablet, Take 1 tablet (100 mg total) by mouth daily., Disp: 30 tablet, Rfl: 11  Past Medical History: Past Medical History:  Diagnosis Date   Abdominal pain, right upper quadrant    Abdominal pain, RUQ (right upper quadrant) 03/12/2016   Achilles tendinitis of right lower extremity 06/25/2023   Acid reflux    Allergic conjunctivitis of both eyes 06/29/2017   Anemia 12/04/2019   Anxiety and depression    Asthma    Cauda equina  syndrome with neurogenic bladder (HCC) 08/13/2021   Complication of anesthesia    slow to awaken, wakes coughing and SOB   COPD (chronic obstructive pulmonary disease) (HCC)    Depression    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   Diverticulitis    Dry eye syndrome of both eyes 08/13/2021   Dyspnea on exertion 08/13/2021   Family history of problems related to stress 11/15/2021   Frequency of urination and polyuria 06/25/2022   GAD (generalized anxiety disorder) 08/15/2021   Glaucoma    H/O urinary  incontinence 06/25/2022   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   High risk medication use 08/02/2018   HLD (hyperlipidemia)    Iron deficiency 12/04/2019   Isolation (social) 12/07/2021   Kidney stones    Microscopic hematuria    Mixed incontinence    Morbid obesity (HCC)    Ocular hypertension, 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   Ocular migraine 08/02/2018   Osteoarthritis    lumbar spine   Osteoarthritis of right knee 08/28/2023   Palpitations 08/13/2021   Plantar fasciitis 06/25/2023   Post covid-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, 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   Postoperative follow-up 03/30/2020   Prediabetes 08/14/2015   Pseudophakia, both eyes 06/26/2016   Rectal bleeding 10/17/2021   Shortness of breath dyspnea    Sleep apnea    CPAP   Vaginal yeast infection    Wears dentures    partial upper    Tobacco Use: Social History   Tobacco Use  Smoking Status Never   Passive exposure: Never (Parent)  Smokeless Tobacco Never    Labs: Review Flowsheet       Latest Ref Rng & Units 08/14/2015 08/15/2015 11/20/2015 05/07/2023  Labs for ITP Cardiac and Pulmonary Rehab  Cholestrol 100 - 199 mg/dL - 161  096  045   LDL (calc) 0 - 99 mg/dL - 80  87  81   HDL-C >40 mg/dL - 32  38  43   Trlycerides 0 - 149 mg/dL - 981  191  478   Hemoglobin A1c 4.8 - 5.6 % 5.6  - - 5.9      Pulmonary Assessment Scores:  Pulmonary Assessment Scores     Row Name 08/25/23 1523         ADL UCSD   ADL Phase Entry     SOB Score total 15     Rest 0     Walk 2     Stairs 5     Bath 2     Dress 2     Shop 4       CAT Score   CAT Score 18       mMRC Score   mMRC Score 1              UCSD: Self-administered rating of dyspnea associated with activities of daily living (ADLs) 6-point scale (0 = "not at all" to 5 = "maximal or unable to do because of breathlessness")  Scoring Scores range from 0 to 120.  Minimally important difference is 5 units  CAT: CAT can identify the health impairment of COPD patients  and is better correlated with disease progression.  CAT has a scoring range of zero to 40. The CAT score is classified into four groups of low (less than 10), medium (10 - 20), high (21-30) and very high (31-40) based on the impact level of disease on health status. A CAT score over 10 suggests significant symptoms.  A worsening CAT score could  be explained by an exacerbation, poor medication adherence, poor inhaler technique, or progression of COPD or comorbid conditions.  CAT MCID is 2 points  mMRC: mMRC (Modified Medical Research Council) Dyspnea Scale is used to assess the degree of baseline functional disability in patients of respiratory disease due to dyspnea. No minimal important difference is established. A decrease in score of 1 point or greater is considered a positive change.   Pulmonary Function Assessment:   Exercise Target Goals: Exercise Program Goal: Individual exercise prescription set using results from initial 6 min walk test and THRR while considering  patient's activity barriers and safety.   Exercise Prescription Goal: Initial exercise prescription builds to 30-45 minutes a day of aerobic activity, 2-3 days per week.  Home exercise guidelines will be given to patient during program as part of exercise prescription that the participant will acknowledge.  Education: Aerobic Exercise: - Group verbal and visual presentation on the components of exercise prescription. Introduces F.I.T.T principle from ACSM for exercise prescriptions.  Reviews F.I.T.T. principles of aerobic exercise including progression. Written material given at graduation. Flowsheet Row Pulmonary Rehab from 12/09/2023 in [facility] Cardiac and Pulmonary Rehab  Education need identified 08/25/23       Education: Resistance Exercise: - Group verbal and visual presentation on the components of exercise prescription. Introduces F.I.T.T principle from ACSM for exercise prescriptions  Reviews F.I.T.T. principles of  resistance exercise including progression. Written material given at graduation. Flowsheet Row Pulmonary Rehab from 12/09/2023 in [facility] Cardiac and Pulmonary Rehab  Date 11/18/23  Educator [facility]  Instruction Review Code 1- [employer] Understanding        Education: Exercise & Equipment Safety: - Individual verbal instruction and demonstration of equipment use and safety with use of the equipment. Flowsheet Row Pulmonary Rehab from 12/09/2023 in [facility] Cardiac and Pulmonary Rehab  Date 08/25/23  Educator [facility]  Instruction Review Code 1- Verbalizes Understanding       Education: Exercise Physiology & General Exercise Guidelines: - Group verbal and written instruction with models to review the exercise physiology of the cardiovascular system and associated critical values. Provides general exercise guidelines with specific guidelines to those with heart or lung disease.    Education: Flexibility, Balance, Mind/Body Relaxation: - Group verbal and visual presentation with interactive activity on the components of exercise prescription. Introduces F.I.T.T principle from ACSM for exercise prescriptions. Reviews F.I.T.T. principles of flexibility and balance exercise training including progression. Also discusses the mind body connection.  Reviews various relaxation techniques to help reduce and manage stress (i.e. Deep breathing, progressive muscle relaxation, and visualization). Balance handout provided to take home. Written material given at graduation. Flowsheet Row Pulmonary Rehab from 12/09/2023 in [facility] Cardiac and Pulmonary Rehab  Date 11/18/23  Educator [facility]  Instruction Review Code 1- Verbalizes Understanding       Activity Barriers & Risk Stratification:  Activity Barriers & Cardiac Risk Stratification - 08/25/23 1516       Activity Barriers & Cardiac Risk Stratification   Activity Barriers Shortness of Breath;Left Hip Replacement;Back Problems             6 Minute Walk:  6 Minute  Walk     Row Name 08/25/23 1514 12/24/23 1405       6 Minute Walk   Phase Initial Discharge    Distance 930 feet 1135 feet    Distance % Change -- 22 %    Distance Feet Change -- 205 ft    Walk Time 6 minutes --    #  of Rest Breaks -- 0    MPH 1.76 2.15    METS 1.3 1.88    RPE 13 17    Perceived Dyspnea  1 3    VO2 Peak 4.5 6.6    Symptoms No No    Resting HR 86 bpm 90 bpm    Resting BP 106/68 118/72    Resting Oxygen Saturation  96 % 94 %    Exercise Oxygen Saturation  during 6 min walk 93 % 88 %    Max Ex. HR 102 bpm 117 bpm    Max Ex. BP 126/74 152/78    2 Minute Post BP 106/64 134/80      Interval HR   1 Minute HR 98 72    2 Minute HR 101 71    3 Minute HR 102 80    4 Minute HR 68 83    5 Minute HR 65 84    6 Minute HR 96 117    2 Minute Post HR 87 94    Interval Heart Rate? Yes Yes      Interval Oxygen   Interval Oxygen? Yes Yes    Baseline Oxygen Saturation % 96 % 94 %    1 Minute Oxygen Saturation % 98 % 92 %    1 Minute Liters of Oxygen 1 L 0 L    2 Minute Oxygen Saturation % 93 % 91 %    2 Minute Liters of Oxygen 1 L 0 L    3 Minute Oxygen Saturation % 94 % 88 %    3 Minute Liters of Oxygen 1 L 0 L    4 Minute Oxygen Saturation % 93 % 92 %    4 Minute Liters of Oxygen 1 L 0 L    5 Minute Oxygen Saturation % 94 % 92 %    5 Minute Liters of Oxygen 1 L 0 L    6 Minute Oxygen Saturation % 97 % 90 %    6 Minute Liters of Oxygen 1 L 0 L    2 Minute Post Oxygen Saturation % 96 % 96 %    2 Minute Post Liters of Oxygen 1 L 0 L            Oxygen Initial Assessment:  Oxygen Initial Assessment - 08/25/23 1521       Home Oxygen   Home Oxygen Device None      Initial 6 min Walk   Oxygen Used Continuous    Liters per minute 1      Program Oxygen Prescription   Program Oxygen Prescription Continuous    Liters per minute 1    Comments use as needed      Intervention   Short Term Goals To learn and exhibit compliance with exercise, home and travel O2  prescription;To learn and understand importance of monitoring SPO2 with pulse oximeter and demonstrate accurate use of the pulse oximeter.;To learn and understand importance of maintaining oxygen saturations>88%;To learn and demonstrate proper pursed lip breathing techniques or other breathing techniques. ;To learn and demonstrate proper use of respiratory medications    Long  Term Goals Exhibits compliance with exercise, home  and travel O2 prescription;Verbalizes importance of monitoring SPO2 with pulse oximeter and return demonstration;Maintenance of O2 saturations>88%;Exhibits proper breathing techniques, such as pursed lip breathing or other method taught during program session;Compliance with respiratory medication;Demonstrates proper use of MDI's             Oxygen Re-Evaluation:  Oxygen Re-Evaluation     Row Name 08/27/23 1456 08/31/23 1411 10/08/23 1353 11/04/23 1359 12/02/23 1420     Program Oxygen Prescription   Program Oxygen Prescription -- Continuous Continuous;None None None   Liters per minute -- 1 -- -- --   Comments -- use as needed use as needed She has not required the use of supplemental O2 in the program, oxygen sat. has stayed above 88% --     Home Oxygen   Home Oxygen Device -- Portable Concentrator;Home Concentrator Portable Concentrator;Home Concentrator Portable Concentrator;Home Concentrator Portable Concentrator;Home Concentrator   Sleep Oxygen Prescription -- CPAP CPAP CPAP CPAP   Liters per minute -- 0 0 0 0   Home Exercise Oxygen Prescription -- Continuous None None None   Liters per minute -- 1  PRN -- -- --   Home Resting Oxygen Prescription -- None None None None   Compliance with Home Oxygen Use -- Yes Yes Yes Yes     Goals/Expected Outcomes   Short Term Goals -- To learn and demonstrate proper pursed lip breathing techniques or other breathing techniques.  To learn and understand importance of maintaining oxygen saturations>88%;To learn and understand  importance of monitoring SPO2 with pulse oximeter and demonstrate accurate use of the pulse oximeter. To learn and understand importance of maintaining oxygen saturations>88%;To learn and understand importance of monitoring SPO2 with pulse oximeter and demonstrate accurate use of the pulse oximeter. To learn and understand importance of maintaining oxygen saturations>88%;To learn and understand importance of monitoring SPO2 with pulse oximeter and demonstrate accurate use of the pulse oximeter.   Long  Term Goals -- Exhibits proper breathing techniques, such as pursed lip breathing or other method taught during program session Verbalizes importance of monitoring SPO2 with pulse oximeter and return demonstration;Maintenance of O2 saturations>88% Verbalizes importance of monitoring SPO2 with pulse oximeter and return demonstration;Maintenance of O2 saturations>88% Maintenance of O2 saturations>88%;Verbalizes importance of monitoring SPO2 with pulse oximeter and return demonstration   Comments Reviewed PLB technique with pt.  Talked about how it works and it's importance in maintaining their exercise saturations. Informed patient how to perform the Pursed Lipped breathing technique. Told patient to Inhale through the nose and out the mouth with pursed lips to keep their airways open, help oxygenate them better, practice when at rest or doing strenuous activity. Patient Verbalizes understanding of technique and will work on and be reiterated during [facility]. Lizabeth has a pulse oximeter to check her oxygen saturation at home. Informed and explained why it is important to have one. Reviewed that oxygen saturations should be 88 percent and above. Patient verbalizes understanding. Omya has recently recovered from bronchitis, she states that she was regularly checking her O2 saturation. She understands to keep her O2 saturation above 88%, and understands to use home oxygen when it falls below 88%. --   Goals/Expected  Outcomes Short: Become more profiecient at using PLB. Long: Become independent at using PLB. Short: use PLB with exertion. Long: use PLB on exertion proficiently and independently. Short: monitor oxygen at home with exertion. Long: maintain oxygen saturations above 88 percent independently. Short: monitor oxygen at home with exertion. Long: maintain oxygen saturations above 88 percent independently. --    Row Name 12/28/23 1357             Program Oxygen Prescription   Program Oxygen Prescription None       Liters per minute 0       Comments She has not required the use of  supplemental O2 in the program, oxygen sat. has stayed above 88%         Home Oxygen   Home Oxygen Device Portable Concentrator;Home Concentrator       Sleep Oxygen Prescription CPAP       Liters per minute 0       Home Exercise Oxygen Prescription None       Liters per minute 1       Home Resting Oxygen Prescription None       Compliance with Home Oxygen Use Yes         Goals/Expected Outcomes   Short Term Goals To learn and understand importance of maintaining oxygen saturations>88%;To learn and understand importance of monitoring SPO2 with pulse oximeter and demonstrate accurate use of the pulse oximeter.       Long  Term Goals Maintenance of O2 saturations>88%;Verbalizes importance of monitoring SPO2 with pulse oximeter and return demonstration       Comments She states that she has not needed to check her O2 saturations at home recently. However, she does own a pulse oximeter at home to check her O2 saturations if she needs to. She continues to practice PLB and has become proficient at using it to help with her SOB.       Goals/Expected Outcomes Short: Monitor O2 saturations at home with exertion. Long: maintain oxygen saturations above 88 percent independently.                Oxygen Discharge (Final Oxygen Re-Evaluation):  Oxygen Re-Evaluation - 12/28/23 1357       Program Oxygen Prescription   Program  Oxygen Prescription None    Liters per minute 0    Comments She has not required the use of supplemental O2 in the program, oxygen sat. has stayed above 88%      Home Oxygen   Home Oxygen Device Portable Concentrator;Home Concentrator    Sleep Oxygen Prescription CPAP    Liters per minute 0    Home Exercise Oxygen Prescription None    Liters per minute 1    Home Resting Oxygen Prescription None    Compliance with Home Oxygen Use Yes      Goals/Expected Outcomes   Short Term Goals To learn and understand importance of maintaining oxygen saturations>88%;To learn and understand importance of monitoring SPO2 with pulse oximeter and demonstrate accurate use of the pulse oximeter.    Long  Term Goals Maintenance of O2 saturations>88%;Verbalizes importance of monitoring SPO2 with pulse oximeter and return demonstration    Comments She states that she has not needed to check her O2 saturations at home recently. However, she does own a pulse oximeter at home to check her O2 saturations if she needs to. She continues to practice PLB and has become proficient at using it to help with her SOB.    Goals/Expected Outcomes Short: Monitor O2 saturations at home with exertion. Long: maintain oxygen saturations above 88 percent independently.             Initial Exercise Prescription:  Initial Exercise Prescription - 08/25/23 1500       Date of Initial Exercise RX and Referring Provider   Date 08/25/23    Referring Provider Dr. Quillian Brunt      Oxygen   Oxygen Continuous    Liters 1L    Maintain Oxygen Saturation 88% or higher      NuStep   Level 1    SPM 80    Minutes 15  METs 1.3      Arm Ergometer   Level 1    Watts 25    RPM 50    Minutes 15    METs 1.3      REL-XR   Level 1    Speed 50    Minutes 15    METs 1.3      Track   Laps 10    Minutes 15    METs 1.54      Prescription Details   Duration Progress to 30 minutes of continuous aerobic without  signs/symptoms of physical distress      Intensity   THRR 40-80% of Max Heartrate 110-135    Ratings of Perceived Exertion 11-13    Perceived Dyspnea 0-4      Progression   Progression Continue to progress workloads to maintain intensity without signs/symptoms of physical distress.      Resistance Training   Training Prescription Yes    Weight 2lb    Reps 10-15             Perform Capillary Blood Glucose checks as needed.  Exercise Prescription Changes:   Exercise Prescription Changes     Row Name 08/25/23 1500 09/10/23 1200 10/07/23 1100 10/14/23 1400 10/20/23 1500     Response to Exercise   Blood Pressure (Admit) 106/68 104/62 118/64 -- 114/78   Blood Pressure (Exercise) 126/74 128/72 -- -- 156/70   Blood Pressure (Exit) 106/64 108/62 122/66 -- 126/72   Heart Rate (Admit) 86 bpm 86 bpm 110 bpm -- 92 bpm   Heart Rate (Exercise) 102 bpm 101 bpm 113 bpm -- 113 bpm   Heart Rate (Exit) 87 bpm 92 bpm 109 bpm -- 90 bpm   Oxygen Saturation (Admit) 96 % 94 % 91 % -- 98 %   Oxygen Saturation (Exercise) 93 % 92 % 91 % -- 89 %   Oxygen Saturation (Exit) 96 % 92 % 93 % -- 96 %   Rating of Perceived Exertion (Exercise) 13 13 16  -- 15   Perceived Dyspnea (Exercise) 1 0 0 -- 1   Symptoms none none none -- none   Comments results First two weeks of exercise -- -- --   Duration -- Progress to 30 minutes of  aerobic without signs/symptoms of physical distress Progress to 30 minutes of  aerobic without signs/symptoms of physical distress Progress to 30 minutes of  aerobic without signs/symptoms of physical distress Progress to 30 minutes of  aerobic without signs/symptoms of physical distress   Intensity -- THRR unchanged THRR unchanged THRR unchanged THRR unchanged     Progression   Progression -- Continue to progress workloads to maintain intensity without signs/symptoms of physical distress. Continue to progress workloads to maintain intensity without signs/symptoms of physical  distress. Continue to progress workloads to maintain intensity without signs/symptoms of physical distress. Continue to progress workloads to maintain intensity without signs/symptoms of physical distress.   Average METs -- 2.12 2.28 2.28 2.5     Resistance Training   Training Prescription -- Yes Yes Yes Yes   Weight -- 2lb 2lb 2lb 2lb   Reps -- 10-15 10-15 10-15 10-15     Interval Training   Interval Training -- No No No No     Oxygen   Oxygen -- Continuous Continuous Continuous Continuous   Liters -- 0-1L 0-1L 0-1L 0-1L     Recumbant Bike   Level -- -- -- -- 3   Watts -- -- -- -- 20  Minutes -- -- -- -- 15   METs -- -- -- -- 2.73     NuStep   Level -- 2 3 3 4    Minutes -- 15 15 15 15    METs -- 2.9 3 3  2.8     Arm Ergometer   Level -- -- 1 1 1    Minutes -- -- 15 15 15    METs -- -- 1 1 2.4     REL-XR   Level -- -- 1 1 4    Minutes -- -- 15 15 15    METs -- -- 2.1 2.1 3.6     T5 Nustep   Level -- -- -- -- 3   Minutes -- -- -- -- 15   METs -- -- -- -- 2.1     Track   Laps -- -- 14 14 20    Minutes -- -- 15 15 15    METs -- -- 1.76 1.76 2.09     Home Exercise Plan   Plans to continue exercise at -- -- -- Home (comment)  walking and hand weights Home (comment)  walking and hand weights   Frequency -- -- -- Add 2 additional days to program exercise sessions. Add 2 additional days to program exercise sessions.   Initial Home Exercises Provided -- -- -- 10/14/23 10/14/23     Oxygen   Maintain Oxygen Saturation -- 88% or higher 88% or higher 88% or higher 88% or higher    Row Name 11/04/23 0800 11/19/23 1600 11/30/23 1400 12/17/23 1300 12/29/23 1500     Response to Exercise   Blood Pressure (Admit) 128/70 120/68 112/72 106/64 118/72   Blood Pressure (Exit) 128/72 102/60 128/76 126/70 122/74   Heart Rate (Admit) 68 bpm 98 bpm 83 bpm 101 bpm 90 bpm   Heart Rate (Exercise) 120 bpm 122 bpm 152 bpm 126 bpm 104 bpm   Heart Rate (Exit) 119 bpm 111 bpm 103 bpm 97 bpm 96 bpm    Oxygen Saturation (Admit) 93 % 95 % 92 % 93 % 93 %   Oxygen Saturation (Exercise) 92 % 90 % 90 % 90 % 91 %   Oxygen Saturation (Exit) 95 % 94 % 93 % 92 % 92 %   Rating of Perceived Exertion (Exercise) 15 15 15 15 17    Perceived Dyspnea (Exercise) 0 0 1 3 1    Symptoms none none none none none   Duration Progress to 30 minutes of  aerobic without signs/symptoms of physical distress Progress to 30 minutes of  aerobic without signs/symptoms of physical distress Progress to 30 minutes of  aerobic without signs/symptoms of physical distress Continue with 30 min of aerobic exercise without signs/symptoms of physical distress. Continue with 30 min of aerobic exercise without signs/symptoms of physical distress.   Intensity THRR unchanged THRR unchanged THRR unchanged THRR unchanged THRR unchanged     Progression   Progression Continue to progress workloads to maintain intensity without signs/symptoms of physical distress. Continue to progress workloads to maintain intensity without signs/symptoms of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 married next month. She also reports sleeping better since the last review. She states that her daughter and son inlaw are a good support system for her.   Expected Outcomes Short: Attend [facility] stress management education to decrease stress. Long: Maintain exercise [facility] to keep stress at a minimum. Short: continue to use CPAP for sleep and stress. Long: maintain a healthy mental state. Short: continue to manage her stress in healthy ways. Long: exercise to maintain mental health. STG: Use support system and communicate with Dr about sleep medication. LTG: exercise to maintain mental health. Short: Continue exercise for stress relief. Long: Maintain positive outlook.   Interventions Encouraged to attend Pulmonary Rehabilitation for the exercise Encouraged to attend Pulmonary Rehabilitation for the exercise Encouraged to attend Pulmonary Rehabilitation for the exercise Encouraged to attend Pulmonary Rehabilitation for the exercise Encouraged to attend Pulmonary Rehabilitation for the exercise   Continue Psychosocial Services  Follow up required by staff Follow up required by staff Follow up required by staff Follow up required by staff Follow up required by staff            Psychosocial Discharge (Final Psychosocial Re-Evaluation):  Psychosocial Re-Evaluation - 12/28/23 1351       Psychosocial Re-Evaluation   Current issues with None Identified    Comments Suda reports no major stressors at this time. She is preparing for her son to get married next month. She also reports sleeping better since the last review. She states that her daughter and son inlaw are a good support system for her.    Expected Outcomes Short: Continue exercise for stress relief. Long: Maintain positive outlook.    Interventions Encouraged to attend Pulmonary Rehabilitation for the exercise    Continue  Psychosocial Services  Follow up required by staff             Education: Education Goals: Education classes will be provided on a weekly basis, covering required topics. Participant will state understanding/return demonstration of topics presented.  Learning Barriers/Preferences:  Learning Barriers/Preferences - 08/24/23 1341       Learning Barriers/Preferences   Learning Barriers None    Learning Preferences None             General Pulmonary Education Topics:  Infection Prevention: - Provides verbal and written material to individual with discussion of infection control including proper hand washing and proper equipment cleaning during exercise session. Flowsheet Row Pulmonary Rehab from 12/09/2023 in [facility] Cardiac and Pulmonary Rehab  Date 08/25/23  Educator [facility]  Instruction Review Code 1- Verbalizes Understanding       Falls Prevention: - Provides verbal and written material to individual with discussion of falls prevention and safety. Flowsheet Row Pulmonary Rehab from 12/09/2023 in  [facility] Cardiac and Pulmonary Rehab  Date 08/25/23  Educator [facility]  Instruction Review Code 1- Verbalizes Understanding       Chronic Lung Disease Review: - Group verbal instruction with posters, models, PowerPoint presentations and videos,  to review new updates, new respiratory medications, new advancements in procedures and treatments. Providing information on websites and "800" numbers for continued self-education. Includes information about supplement oxygen, available portable oxygen systems, continuous and intermittent flow rates, oxygen safety, concentrators, and Medicare reimbursement for oxygen. Explanation of Pulmonary Drugs, including class, frequency, complications, importance of spacers, rinsing mouth after steroid MDI's, and proper cleaning methods for nebulizers. Review of basic lung anatomy and physiology related to function, structure, and complications of lung disease. Review  of risk factors. Discussion about methods for diagnosing sleep apnea and types of masks and machines for OSA. Includes a review of the use of types of environmental controls: home humidity, furnaces, filters, dust mite/pet prevention, HEPA vacuums. Discussion about weather changes, air quality and the benefits of nasal washing. Instruction on Warning signs, infection symptoms, calling MD promptly, preventive modes, and value of vaccinations. Review of effective airway clearance, coughing and/or vibration techniques. Emphasizing that all should Create an Action Plan. Written material given at graduation. Flowsheet Row Pulmonary Rehab from 12/09/2023 in [facility] Cardiac and Pulmonary Rehab  Education need identified 08/25/23       AED/CPR: - Group verbal and written instruction with the use of models to demonstrate the basic use of the AED with the basic ABC's of resuscitation.    Anatomy and Cardiac Procedures: - Group verbal and visual presentation and models provide information about basic cardiac anatomy and function. Reviews the testing methods done to diagnose heart disease and the outcomes of the test results. Describes the treatment choices: Medical Management, Angioplasty, or Coronary Bypass Surgery for treating various heart conditions including Myocardial Infarction, Angina, Valve Disease, and Cardiac Arrhythmias.  Written material given at graduation.   Medication Safety: - Group verbal and visual instruction to review commonly prescribed medications for heart and lung disease. Reviews the medication, class of the drug, and side effects. Includes the steps to properly store meds and maintain the prescription regimen.  Written material given at graduation.   Other: -Provides group and verbal instruction on various topics (see comments)   Knowledge Questionnaire Score:  Knowledge Questionnaire Score - 08/25/23 1525       Knowledge Questionnaire Score   Pre Score 14/18               Core Components/Risk Factors/Patient Goals at Admission:  Personal Goals and Risk Factors at Admission - 08/24/23 1343       Core Components/Risk Factors/Patient Goals on Admission    Weight Management Yes;Weight Maintenance    Intervention Weight Management: Develop a combined nutrition and exercise program designed to reach desired caloric intake, while maintaining appropriate intake of nutrient and fiber, sodium and fats, and appropriate energy expenditure required for the weight goal.;Weight Management: Provide education and appropriate resources to help participant work on and attain dietary goals.    Admit Weight 230 lb (104.3 kg)   weighed 290 lb prior to knee surgery worked down to 223 lb   Goal Weight: Long Term 230 lb (104.3 kg)    Expected Outcomes Short Term: Continue to assess and modify interventions until short term weight is achieved;Long Term: Adherence to nutrition and physical activity/exercise program aimed toward attainment of established weight goal;Weight Maintenance: Understanding of the daily nutrition guidelines, which includes 25-35% calories  from fat, 7% or less cal from saturated fats, less than 200mg  cholesterol, less than 1.5gm of sodium, & 5 or more servings of fruits and vegetables daily    Improve shortness of breath with ADL's Yes    Intervention Provide education, individualized exercise plan and daily activity instruction to help decrease symptoms of SOB with activities of daily living.    Expected Outcomes Short Term: Improve cardiorespiratory fitness to achieve a reduction of symptoms when performing ADLs;Long Term: Be able to perform more ADLs without symptoms or delay the onset of symptoms    Increase knowledge of respiratory medications and ability to use respiratory devices properly  Yes    Intervention Provide education and demonstration as needed of appropriate use of medications, inhalers, and oxygen therapy.    Expected Outcomes Short Term: Achieves  understanding of medications use. Understands that oxygen is a medication prescribed by physician. Demonstrates appropriate use of inhaler and oxygen therapy.;Long Term: Maintain appropriate use of medications, inhalers, and oxygen therapy.    Lipids Yes    Intervention Provide education and support for participant on nutrition & aerobic/resistive exercise along with prescribed medications to achieve LDL 70mg , HDL >40mg .    Expected Outcomes Short Term: Participant states understanding of desired cholesterol values and is compliant with medications prescribed. Participant is following exercise prescription and nutrition guidelines.;Long Term: Cholesterol controlled with medications as prescribed, with individualized exercise RX and with personalized nutrition plan. Value goals: LDL < 70mg , HDL > 40 mg.             Education:Diabetes - Individual verbal and written instruction to review signs/symptoms of diabetes, desired ranges of glucose level fasting, after meals and with exercise. Acknowledge that pre and post exercise glucose checks will be done for 3 sessions at entry of program.   Know Your Numbers and Heart Failure: - Group verbal and visual instruction to discuss disease risk factors for cardiac and pulmonary disease and treatment options.  Reviews associated critical values for Overweight/Obesity, Hypertension, Cholesterol, and Diabetes.  Discusses basics of heart failure: signs/symptoms and treatments.  Introduces Heart Failure Zone chart for action plan for heart failure.  Written material given at graduation.   Core Components/Risk Factors/Patient Goals Review:   Goals and Risk Factor Review     Row Name 08/31/23 1413 10/08/23 1359 11/04/23 1408 12/02/23 1417 12/28/23 1355     Core Components/Risk Factors/Patient Goals Review   Personal Goals Review Improve shortness of breath with ADL's Improve shortness of breath with ADL's Improve shortness of breath with ADL's Improve  shortness of breath with ADL's Improve shortness of breath with ADL's   Review Spoke to patient about their shortness of breath and what they can do to improve. Patient has been informed of breathing techniques when starting the program. Patient is informed to tell staff if they have had any med changes and that certain meds they are taking or not taking can be causing shortness of breath. Rubye states she is not checking her blood pressure at home and does not want a blood pressure cuff at home. She states she gets her reading at the doctors and at rehab and her blood pressure is good. Her blood pressure has been stable in the program with no concerning readings. Her weight has gone up slightly and wants to lose about 50 pounds. Bhoomi has not been checking her BP at home. She has been checking her oxygen saturation as of recent because of her recent sickness. She states that she will  begin to check her oxygen saturation when she starts to exercise at home. She understands the importance of breathing techniques and understands when to utilize them. Spoke with Zamayah about her shortness of breath and ADLs. She knows the importance of exercise to help her breathing. Reviewed PLB and she verbalizes understanding. Gana reports her breathing is much better than when she started the program. She states that she only gets SOB when really pushing herself during exercise. We encouraged her to continue practicing PLB once she graduates from the program.   Expected Outcomes Short: Attend [facility] regularly to improve shortness of breath with ADL's. Long: maintain independence with ADL's Short: lose a few pounds in a few weeks. Long: reach your weight goal. Short: maintain BP, start checking oxygen saturation at home. Long: Continue to exercise at home and in program to manage risk factors. STG: start checking O2 saturations at home. LTG. Continue to exercise at home and in program to manage rick factors Short: Continue  PLB after graduation. Long: Continue to monitor lifestyle risk factors.            Core Components/Risk Factors/Patient Goals at Discharge (Final Review):   Goals and Risk Factor Review - 12/28/23 1355       Core Components/Risk Factors/Patient Goals Review   Personal Goals Review Improve shortness of breath with ADL's    Review Lynette reports her breathing is much better than when she started the program. She states that she only gets SOB when really pushing herself during exercise. We encouraged her to continue practicing PLB once she graduates from the program.    Expected Outcomes Short: Continue PLB after graduation. Long: Continue to monitor lifestyle risk factors.             ITP Comments:  ITP Comments     Row Name 08/24/23 1354 08/25/23 1513 08/26/23 1211 08/27/23 1455 09/23/23 1313   ITP Comments Virtual orientation call completed today. shehas an appointment on Date: 08/25/2023  for EP eval and gym Orientation.  Documentation of diagnosis can be found in [facility] Date: 08/11/2023 . Completed and gym orientation. Initial ITP created and sent for review to Dr. Faud Aleskerov, Medical Director. 30 Day review completed. Medical Director ITP review done, changes made as directed, and signed approval by Medical Director.    new to program orientation only completed First full day of exercise!  Patient was oriented to gym and equipment including functions, settings, policies, and procedures.  Patient's individual exercise prescription and treatment plan were reviewed.  All starting workloads were established based on the results of the 6 minute walk test done at initial orientation visit.  The plan for exercise progression was also introduced and progression will be customized based on patient's performance and goals. 30 Day review completed. Medical Director ITP review done, changes made as directed, and signed approval by Medical Director.    new to program    Row Name 10/21/23 1130  11/18/23 1032 12/16/23 0823 01/04/24 1409     ITP Comments 30 Day review completed. Medical Director ITP review done, changes made as directed, and signed approval by Medical Director. 30 Day review completed. Medical Director ITP review done, changes made as directed, and signed approval by Medical Director. 30 Day review completed. Medical Director ITP review done, changes made as directed, and signed approval by Medical Director. Tanika graduated today from  rehab with 36 sessions completed.  Details of the patient's exercise prescription and what She needs to do in  order to continue the prescription and progress were discussed with patient.  Patient was given a copy of prescription and goals.  Patient verbalized understanding. Blayne plans to continue to exercise by walking.             Comments: Discharge ITP

## 2024-01-05 ENCOUNTER — Encounter: Payer: Self-pay | Admitting: Psychiatry

## 2024-01-05 ENCOUNTER — Encounter: Payer: Self-pay | Admitting: Family Medicine

## 2024-01-05 ENCOUNTER — Ambulatory Visit (INDEPENDENT_AMBULATORY_CARE_PROVIDER_SITE_OTHER): Payer: Self-pay | Admitting: Psychiatry

## 2024-01-05 ENCOUNTER — Ambulatory Visit (INDEPENDENT_AMBULATORY_CARE_PROVIDER_SITE_OTHER): Admitting: Family Medicine

## 2024-01-05 VITALS — BP 112/86 | HR 103 | Temp 97.8°F | Ht 62.1 in | Wt 249.8 lb

## 2024-01-05 VITALS — BP 116/66 | HR 90 | Ht 62.0 in | Wt 249.0 lb

## 2024-01-05 DIAGNOSIS — R051 Acute cough: Secondary | ICD-10-CM

## 2024-01-05 DIAGNOSIS — F3341 Major depressive disorder, recurrent, in partial remission: Secondary | ICD-10-CM | POA: Diagnosis not present

## 2024-01-05 DIAGNOSIS — J9611 Chronic respiratory failure with hypoxia: Secondary | ICD-10-CM | POA: Diagnosis not present

## 2024-01-05 DIAGNOSIS — F4310 Post-traumatic stress disorder, unspecified: Secondary | ICD-10-CM

## 2024-01-05 DIAGNOSIS — H6502 Acute serous otitis media, left ear: Secondary | ICD-10-CM | POA: Diagnosis not present

## 2024-01-05 DIAGNOSIS — G4700 Insomnia, unspecified: Secondary | ICD-10-CM | POA: Diagnosis not present

## 2024-01-05 MED ORDER — AMOXICILLIN 875 MG PO TABS
875.0000 mg | ORAL_TABLET | Freq: Two times a day (BID) | ORAL | 0 refills | Status: AC
Start: 2024-01-05 — End: 2024-01-15

## 2024-01-05 MED ORDER — TRAZODONE HCL 50 MG PO TABS: 25.0000 mg | ORAL_TABLET | Freq: Every evening | ORAL | 0 refills | Status: DC | PRN

## 2024-01-05 NOTE — Progress Notes (Signed)
 ACUTE PATIENT VISIT    Patient: Faith Padilla   DOB: 16-Jun-1951   73 y.o. Female  MRN: [id number] Visit Date: 01/05/2024  Today's healthcare provider: Mimi Alt, MD   PCP: Carlean Charter, DO   Chief Complaint  Patient presents with   Cough    Coughing, congestion, running nose this has been going on for about 5 days now     Subjective     HPI     Cough    Additional comments: Coughing, congestion, running nose this has been going on for about 5 days now       Last edited by Bart Lieu, CMA on 01/05/2024  1:16 PM.       Discussed the use of AI scribe software for clinical note transcription with the patient, who gave verbal consent to proceed.  History of Present Illness          Discussed the use of AI scribe software for clinical note transcription with the patient, who gave verbal consent to proceed.  History of Present Illness Faith Padilla is a 73 year old female with obstructive sleep apnea and pulmonary fibrosis who presents with symptoms concerning for sinusitis.  She has been experiencing coughing, congestion, and a runny nose for the past five days. The congestion is primarily in her head, and she notes itching in both ears. No sore throat or headache, although she later mentions a scratchy throat likely due to coughing. No fever or chills. She extracts dry material from her ears, which itch but do not hurt. Her nasal discharge is clear throughout the day, although it is more discolored in the morning.  She has been using cetirizine , her inhaler, Symbicort , Singulair , Cytoxan, and Zyrtec  to manage her symptoms, but reports no significant improvement. She also takes Tylenol  twice a day and ibuprofen at night. Despite these medications, she has been unable to sleep well, only getting about two hours of sleep due to the symptoms.  She has a history of pulmonary fibrosis and recently completed pulmonary rehabilitation, which she feels  has stabilized her condition. She uses a CPAP machine at night to aid her breathing. She has not been around anyone diagnosed with pneumonia or flu recently, although she notes that people around her have been sick off and on.       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   Diverticulitis    Dry eye syndrome of both eyes 08/13/2021   Dyspnea on exertion 08/13/2021   Family history of problems related to stress 11/15/2021   Frequency of urination and polyuria 06/25/2022   GAD (generalized anxiety disorder) 08/15/2021   Glaucoma    H/O urinary incontinence 06/25/2022   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   High risk medication use 08/02/2018   HLD (hyperlipidemia)    Iron deficiency 12/04/2019   Isolation (social) 12/07/2021   Kidney  stones    Microscopic hematuria    Mixed incontinence    Morbid obesity (HCC)    Ocular hypertension, 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   Ocular migraine 08/02/2018   Osteoarthritis    lumbar spine   Osteoarthritis of right knee 08/28/2023   Palpitations 08/13/2021   Plantar fasciitis 06/25/2023   Post covid-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, 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   atorvastatin  (LIPITOR) 20 MG tablet TAKE 1 TABLET(20 MG) BY MOUTH AT BEDTIME   Biotin 1 MG CAPS Take 1 capsule by mouth daily.   budesonide -formoterol  (SYMBICORT ) 160-4.5 MCG/ACT inhaler Inhale 2 puffs into the lungs 2 (two) times daily.   CALCIUM  PO Take by mouth.   cetirizine  (ZYRTEC ) 10 MG tablet Take 1 tablet (10 mg total) by mouth at bedtime.   clotrimazole -betamethasone  (LOTRISONE ) cream Apply 1 Application topically 2 (two) times daily.   DULoxetine  (CYMBALTA ) 60 MG capsule Take 1 capsule (60 mg total) by mouth daily. Reported on 12/26/2015   fluticasone  (FLONASE ) 50 MCG/ACT nasal spray Place 2 sprays into both nostrils daily.   Gabapentin , Once-Daily, 300 MG TABS Take 0.5 tablets (150 mg total) by mouth at bedtime.   ipratropium (ATROVENT ) 0.03 % nasal spray PLACE 2 SPRAYS INTO BOTH NOSTRILS EVERY 12 HOURS USE IN PLACE OF FLONASE    ipratropium-albuterol  (DUONEB) 0.5-2.5 (3) MG/3ML SOLN Take 3 mLs by nebulization every 4 (four) hours as needed.   levothyroxine  (SYNTHROID ) 75 MCG tablet Take 1 tablet (75 mcg total) by mouth daily before breakfast.   meloxicam  (MOBIC ) 15 MG tablet Take 1 tablet by mouth daily.   montelukast  (SINGULAIR ) 10 MG tablet Take 1 tablet (10 mg total) by mouth at bedtime.   nystatin  (MYCOSTATIN /NYSTOP ) powder Apply 1 Application topically 3 (three) times daily.   omeprazole  (PRILOSEC) 20 MG capsule TAKE 1 CAPSULE(20 MG) BY MOUTH TWICE DAILY BEFORE A MEAL   Probiotic Product (PROBIOTIC PO) Take by mouth.   topiramate  (TOPAMAX ) 50 MG tablet TAKE 1 TABLET(50 MG) BY MOUTH DAILY   [START ON 01/23/2024] traZODone  (DESYREL ) 50 MG tablet Take 0.5-1 tablets (25-50 mg total) by mouth at bedtime as needed for sleep.   trimethoprim   (TRIMPEX ) 100 MG tablet Take 1 tablet (100 mg total) by mouth daily.   No facility-administered medications prior to visit.    Review of Systems  Last CBC Lab Results  Component Value Date   WBC 11.3 (H) 10/16/2023   HGB 15.0 10/16/2023   HCT 46.4 10/16/2023   MCV 93 10/16/2023   MCH 30.1 10/16/2023   RDW 14.5 10/16/2023   PLT 429 10/16/2023   Last metabolic panel Lab Results  Component Value Date   GLUCOSE 98 07/20/2023   NA 139 07/20/2023   K 5.5 (H) 07/20/2023   CL 105 07/20/2023   CO2 22 07/20/2023   BUN 26 07/20/2023   CREATININE 1.00 07/20/2023   EGFR 60 07/20/2023   CALCIUM  9.8 07/20/2023   PROT 6.9 05/07/2023   ALBUMIN 4.1 05/07/2023   LABGLOB 2.8 05/07/2023  AGRATIO 1.4 08/15/2015   BILITOT 0.3 05/07/2023   ALKPHOS 119 05/07/2023   AST 15 05/07/2023   ALT 9 05/07/2023   ANIONGAP 10 03/04/2016   Last lipids Lab Results  Component Value Date   CHOL 149 05/07/2023   HDL 43 05/07/2023   LDLCALC 81 05/07/2023   TRIG 143 05/07/2023   CHOLHDL 3.5 05/07/2023   Last hemoglobin A1c Lab Results  Component Value Date   HGBA1C 5.9 (H) 05/07/2023   Last thyroid  functions Lab Results  Component Value Date   TSH 1.080 10/16/2023        Objective    BP 116/66   Pulse 90   Ht 5\' 2"  (1.575 m)   Wt 249 lb (112.9 kg)   SpO2 98%   BMI 45.54 kg/m  BP Readings from Last 3 Encounters:  01/05/24 116/66  12/17/23 (!) 140/61  11/03/23 120/78   Wt Readings from Last 3 Encounters:  01/05/24 249 lb (112.9 kg)  12/24/23 246 lb 1.6 oz (111.6 kg)  12/17/23 245 lb (111.1 kg)        Physical Exam  Physical Exam HEENT: Left ear with effusion behind the eardrum. Right ear canal with dry skin. Sinuses tender on palpation. CHEST: Occasional wheeze bilaterally, no rhonchi, no crackles.    Results for orders placed or performed in visit on 01/05/24  POC Covid19/Flu A&B Antigen  Result Value Ref Range   Influenza A Antigen, POC Negative Negative    Influenza B Antigen, POC Negative Negative   Covid Antigen, POC Negative Negative    Assessment & Plan     Problem List Items Addressed This Visit       Respiratory   Chronic hypoxemic respiratory failure (HCC)   Other Visit Diagnoses       Non-recurrent acute serous otitis media of left ear    -  Primary   Relevant Medications   amoxicillin  (AMOXIL ) 875 MG tablet     Acute cough       Relevant Orders   POC Covid19/Flu A&B Antigen (Completed)      Assessment & Plan Allergic rhinitis Acute exacerbation with coughing, congestion, rhinorrhea, and postnasal drip for five days. Fluid behind the left tympanic membrane suggests congestion. No fever, chills, or significant sore throat. COVID-19 and influenza tests are negative. Differential includes sinusitis or otitis media, but presentation suggests severe allergy  flare. Plan to monitor symptoms for two more days before initiating antibiotics if no improvement. - Continue cetirizine , Symbicort , Singulair , and Zyrtec . - Monitor symptoms for two more days; initiate amoxicillin  875 mg BID for 7 days if no improvement. - Ensure adequate hydration, especially if antibiotics are started. - Seek urgent care for severe symptoms such as dyspnea, dizziness, vomiting, or severe diarrhea. - Reassess if symptoms worsen before Thursday or if no improvement after a week of antibiotics.     No follow-ups on file.       Mimi Alt, MD  [facility] [phone] (phone) [phone] (fax)  [facility]

## 2024-01-05 NOTE — Patient Instructions (Signed)
 Continue duloxetine  60 mg daily  Decrease hydroxyzine  25 mg at night one week then discontinue Reduce melatonin 6 mg at night Discontinue trazodone  Next appointment: 6/16 at 4 PM,

## 2024-01-06 LAB — POC COVID19/FLU A&B COMBO
Covid Antigen, POC: NEGATIVE
Influenza A Antigen, POC: NEGATIVE
Influenza B Antigen, POC: NEGATIVE

## 2024-01-08 NOTE — Telephone Encounter (Signed)
Left pt message to return call 

## 2024-01-15 NOTE — Telephone Encounter (Signed)
 Pt is calling in regards to her CPAP supplies. She was advised by [facility] for her to receive new supplies, they require an order from the original provider that ordered supplies. She is unsure of who ordered them, due to having machine for over 5 years. Reviewed chart and it appears Dr. Esaw Heckler with [facility] was her last pulmonologist, in [place]. Advised pt to contact their office to have order sent to DME company; however, she reports she has difficulty with memory since a major surgery and asked if we could possibly reach out to Dr. Bryon Caraway CB#[phone]. Esaw Heckler [phone], pt says to ask for [facility].

## 2024-01-20 NOTE — Telephone Encounter (Signed)
 Patient called back. She requested to be seen here for sleep. Scheduled in the [place] office with Dr Kieran Pellet 5/29. Nothing further needed.

## 2024-01-20 NOTE — Telephone Encounter (Signed)
 Pt notified and she wishes she schedule a sleep consult with our office.    Spoke with Deana at Dr Esaw Heckler office and since they have not seen pt in over a year they cannot send supply order. She was able to pull the pts download and states pt is 97% complaint on her CPAP. Per Canary Ceo she will need to see Esaw Heckler office or us  for consult to get supplies order.

## 2024-01-20 NOTE — Telephone Encounter (Signed)
 Left voicemail for patient. Our next sleep consult is not available until 7/23. Advised patient to contact Dr Esaw Heckler as they will likely be able to see her sooner since she is established with them.

## 2024-01-26 ENCOUNTER — Other Ambulatory Visit: Payer: Self-pay | Admitting: Family Medicine

## 2024-01-26 DIAGNOSIS — J3489 Other specified disorders of nose and nasal sinuses: Secondary | ICD-10-CM

## 2024-01-27 ENCOUNTER — Other Ambulatory Visit: Payer: Self-pay | Admitting: Family Medicine

## 2024-02-02 ENCOUNTER — Ambulatory Visit: Admitting: Professional Counselor

## 2024-02-03 NOTE — Telephone Encounter (Signed)
 Copied from CRM [id number]. Topic: General - Other >> Feb 03, 2024 10:40 AM Faith Padilla wrote: Reason for CRM: PT RETURNING CALL STATED SHE HAD HER LAST SLEEP STUDY ABOUT 10 YEARS AGO AT THE [facility]). I ALSO ADVISED HER TO BRING HER CPAP MACHINE TO THE APPT TOMORROW. THANKS

## 2024-02-03 NOTE — Telephone Encounter (Signed)
 Noted. Nothing further needed.

## 2024-02-04 ENCOUNTER — Encounter: Payer: Self-pay | Admitting: Sleep Medicine

## 2024-02-04 ENCOUNTER — Ambulatory Visit (INDEPENDENT_AMBULATORY_CARE_PROVIDER_SITE_OTHER): Admitting: Sleep Medicine

## 2024-02-04 VITALS — BP 110/68 | HR 91 | Temp 97.1°F | Ht 62.0 in | Wt 247.8 lb

## 2024-02-04 DIAGNOSIS — G4700 Insomnia, unspecified: Secondary | ICD-10-CM

## 2024-02-04 DIAGNOSIS — G4733 Obstructive sleep apnea (adult) (pediatric): Secondary | ICD-10-CM

## 2024-02-04 DIAGNOSIS — Z6841 Body Mass Index (BMI) 40.0 and over, adult: Secondary | ICD-10-CM

## 2024-02-04 DIAGNOSIS — F5104 Psychophysiologic insomnia: Secondary | ICD-10-CM

## 2024-02-04 NOTE — Progress Notes (Signed)
 Name:Faith Padilla MRN: [id number] DOB: [birth year]   CHIEF COMPLAINT:  CPAP F/U   HISTORY OF PRESENT ILLNESS:  Faith Padilla is a 73 y.o. w/ a h/o OSA, morbid obesity, pulmonary fibrosis, hyperlipidemia, anxiety and depression who presents for CPAP follow up visit. Reports using CPAP therapy every night, which is confirmed by compliance data. She is currently using a full face mask, which causes significant air leaks. Reports feeling more refreshed upon awakening with CPAP therapy.   Reports difficulty with sleep initiation. States that she drinks 1 cup of coffee and 2 sodas daily.    Bedtime 12-1 am Sleep onset 1-2 hours Rise time 8-9 am   EPWORTH SLEEP SCORE 29    02/04/2024    2:00 PM  Results of the Epworth flowsheet  Sitting and reading 0  Watching TV 0  Sitting, inactive in a public place (e.g. a theatre or a meeting) 0  As a passenger in a car for an hour without a break 0  Lying down to rest in the afternoon when circumstances permit 1  Sitting and talking to someone 0  Sitting quietly after a lunch without alcohol 0  In a car, while stopped for a few minutes in traffic 0  Total score 1    PAST MEDICAL HISTORY :   has a past medical history of Abdominal pain, right upper quadrant, Abdominal pain, RUQ (right upper quadrant) (03/12/2016), Achilles tendinitis of right lower extremity (06/25/2023), Acid reflux, Allergic conjunctivitis of both eyes (06/29/2017), Anemia (12/04/2019), Anxiety and depression, Asthma, 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) (08/13/2021), Complication of anesthesia, COPD (chronic obstructive pulmonary disease) (HCC), Depression, 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 (08/15/2021), Diverticulitis, Dry eye syndrome of both eyes (08/13/2021), Dyspnea on exertion (08/13/2021), Family history of problems related to stress (11/15/2021), Frequency of urination and polyuria (06/25/2022),  GAD (generalized anxiety disorder) (08/15/2021), Glaucoma, H/O urinary incontinence (06/25/2022), 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), High risk medication use (08/02/2018), HLD (hyperlipidemia), Iron deficiency (12/04/2019), Isolation (social) (12/07/2021), Kidney stones, Microscopic hematuria, Mixed incontinence, Morbid obesity (HCC), Ocular hypertension, 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  has a past surgical history that includes Abdominal hysterectomy (09/08/1996); Back surgery (1990/2014); Cataract extraction, bilateral (09/08/2012); Lithotripsy (09/08/2010); deviated nose septum surgery (09/08/1998); Eye surgery; Colonoscopy with propofol  (N/A, 03/25/2016); Esophagogastroduodenoscopy (egd) with propofol  (N/A, 03/25/2016); Total hip arthroplasty (Left); Joint replacement; Colonoscopy (N/A, 12/17/2023); and Polypectomy (12/17/2023). Prior to Admission medications   Medication Sig Start Date End Date Taking? Authorizing Provider  albuterol  (VENTOLIN  HFA) 108 (90 Base) MCG/ACT inhaler Inhale 1-2 puffs into the lungs every 6 (six) hours as needed for wheezing or shortness of breath. 07/07/23  Yes Kandice Orleans, MD  atorvastatin  (LIPITOR) 20 MG tablet TAKE 1 TABLET(20 MG) BY MOUTH AT BEDTIME 03/07/16  Yes Ulysees Gander, MD  Biotin 1 MG CAPS Take 1 capsule by mouth daily.   Yes [provider]  budesonide -formoterol  (SYMBICORT ) 160-4.5 MCG/ACT inhaler Inhale 2 puffs into the lungs 2 (two) times daily. 10/16/23  Yes Pardue, Asencion Blacksmith, DO  cetirizine  (ZYRTEC ) 10 MG tablet TAKE 1 TABLET BY MOUTH  EVERY DAY 01/27/24  Yes Pardue, Asencion Blacksmith, DO  DULoxetine  (CYMBALTA ) 60 MG capsule Take 1 capsule (60  mg total) by mouth daily. Reported on 12/26/2015 05/07/23  Yes Butler, Kristina, FNP  ipratropium (ATROVENT ) 0.03 % nasal spray PLACE 2 SPRAYS INTO BOTH NOSTRILS EVERY 12 HOURS USE IN PLACE OF FLONASE  01/26/24  Yes Pardue, Asencion Blacksmith, DO  ipratropium-albuterol  (DUONEB) 0.5-2.5 (3) MG/3ML SOLN Take 3 mLs by nebulization every 4 (four) hours as needed. 10/16/23  Yes Pardue, Asencion Blacksmith, DO  levothyroxine  (SYNTHROID ) 75 MCG tablet Take 1 tablet (75 mcg total) by mouth daily before breakfast. 05/07/23  Yes Wilhelmena Hanson, FNP  meloxicam  (MOBIC ) 15 MG tablet Take 1 tablet by mouth daily.   Yes [provider]  montelukast  (SINGULAIR ) 10 MG tablet Take 1 tablet (10 mg total) by mouth at bedtime. 07/07/23  Yes Kandice Orleans, MD  nystatin  (MYCOSTATIN /NYSTOP ) powder Apply 1 Application topically 3 (three) times daily. 05/07/23  Yes Wilhelmena Hanson, FNP  omeprazole  (PRILOSEC) 20 MG capsule TAKE 1 CAPSULE(20 MG) BY MOUTH TWICE DAILY BEFORE A MEAL 12/24/23  Yes Pardue, Sarah N, DO  trimethoprim  (TRIMPEX ) 100 MG tablet Take 1 tablet (100 mg total) by mouth daily. 07/27/23  Yes MacDiarmid, Geralyn Knee, MD  CALCIUM  PO Take by mouth. Patient not taking: Reported on 02/04/2024    [provider]  clotrimazole -betamethasone  (LOTRISONE ) cream Apply 1 Application topically 2 (two) times daily. Patient not taking: Reported on 02/04/2024 10/30/23   Carlean Charter, DO  fluticasone  (FLONASE ) 50 MCG/ACT nasal spray Place 2 sprays into both nostrils daily. Patient not taking: Reported on 02/04/2024 07/07/23   Kandice Orleans, MD  Gabapentin , Once-Daily, 300 MG TABS Take 0.5 tablets (150 mg total) by mouth at bedtime. Patient not taking: Reported on 02/04/2024 07/20/23   Carlean Charter, DO  Probiotic Product (PROBIOTIC PO) Take by mouth. Patient not taking: Reported on 02/04/2024    [provider]  topiramate  (TOPAMAX )  50 MG tablet TAKE 1 TABLET(50 MG) BY MOUTH DAILY Patient not taking: Reported on 02/04/2024 12/24/23   Carlean Charter, DO  traZODone  (DESYREL ) 50 MG tablet Take 0.5-1 tablets (25-50 mg total) by mouth at bedtime as needed for sleep. Patient not taking: Reported on 02/04/2024 01/23/24 02/22/24  Todd Fossa, MD   Allergies  Allergen Reactions   Morphine Hives    FAMILY HISTORY:  family history includes Allergic rhinitis in her sister; Asthma in her mother and sister; COPD in her father; Cancer in her father and sister; Depression in her daughter and mother; Diabetes in her mother; Heart disease in her mother; Hematuria in her daughter; Liver cancer in her father. SOCIAL HISTORY:  reports that she has never smoked. She has never been exposed to tobacco smoke. She has never used smokeless tobacco. She reports that she does not drink alcohol and does not use drugs.   Review of Systems:  Gen:  Denies  fever, sweats, chills weight loss  HEENT: Denies blurred vision, double vision, ear pain, eye pain, hearing loss, nose bleeds, sore throat Cardiac:  No dizziness, chest pain or heaviness, chest tightness,edema, No JVD Resp:   No cough, -sputum production, -shortness of breath,-wheezing, -hemoptysis,  Gi: Denies swallowing difficulty, stomach pain, nausea or vomiting, diarrhea, constipation, bowel incontinence Gu:  Denies bladder incontinence, burning urine Ext:   Denies Joint pain, stiffness or swelling Skin: Denies  skin rash, easy bruising or bleeding or hives Endoc:  Denies polyuria, polydipsia , polyphagia or weight change Psych:   Denies depression, insomnia or hallucinations  Other:  All other systems negative  VITAL SIGNS: BP 110/68 (BP Location:  Right Arm, Cuff Size: Large)   Pulse 91   Temp (!) 97.1 F (36.2 C)   Ht 5\' 2"  (1.575 m)   Wt 247 lb 12.8 oz (112.4 kg)   SpO2 95%   BMI 45.32 kg/m    Physical Examination:   General Appearance: No distress  EYES PERRLA, EOM intact.    NECK Supple, No JVD Pulmonary: normal breath sounds, No wheezing.  CardiovascularNormal S1,S2.  No m/r/g.   Abdomen: Benign, Soft, non-tender. Skin:   warm, no rashes, no ecchymosis  Extremities: normal, no cyanosis, clubbing. Neuro:without focal findings,  speech normal  PSYCHIATRIC: Mood, affect within normal limits.   ASSESSMENT AND PLAN  OSA Patient is using and benefiting from CPAP therapy. For mask leaks, will try patient on the Airfit F40 FFM. Discussed the consequences of untreated sleep apnea. Advised not to drive drowsy for safety of patient and others. Will follow up in 1 year to review CPAP efficacy and compliance data.    Morbid obesity Counseled patient on diet and lifestyle modification.  Insomnia Counseled patient on stimulus control and improving sleep hygiene practices.   Patient  satisfied with Plan of action and management. All questions answered  I spent a total of 41 minutes reviewing chart data, face-to-face evaluation with the patient, counseling and coordination of care as detailed above.    Naoma Boxell, M.D.  Sleep Medicine [facility]

## 2024-02-04 NOTE — Patient Instructions (Signed)

## 2024-02-08 ENCOUNTER — Ambulatory Visit (INDEPENDENT_AMBULATORY_CARE_PROVIDER_SITE_OTHER): Payer: 59 | Admitting: Urology

## 2024-02-08 VITALS — BP 120/78 | HR 61 | Ht 62.0 in | Wt 250.0 lb

## 2024-02-08 DIAGNOSIS — N3941 Urge incontinence: Secondary | ICD-10-CM | POA: Diagnosis not present

## 2024-02-08 LAB — URINALYSIS, COMPLETE
Bilirubin, UA: NEGATIVE
Glucose, UA: NEGATIVE
Ketones, UA: NEGATIVE
Nitrite, UA: NEGATIVE
Protein,UA: NEGATIVE
RBC, UA: NEGATIVE
Specific Gravity, UA: 1.025 (ref 1.005–1.030)
Urobilinogen, Ur: 0.2 mg/dL (ref 0.2–1.0)
pH, UA: 6 (ref 5.0–7.5)

## 2024-02-08 LAB — MICROSCOPIC EXAMINATION

## 2024-02-08 MED ORDER — GEMTESA 75 MG PO TABS: 75.0000 mg | ORAL_TABLET | Freq: Every day | ORAL | Status: AC

## 2024-02-08 MED ORDER — GEMTESA 75 MG PO TABS: 75.0000 mg | ORAL_TABLET | Freq: Every day | ORAL | 11 refills | Status: DC

## 2024-02-08 NOTE — Progress Notes (Signed)
 02/08/2024 1:36 PM   Faith Padilla 06-Feb-1951 [id number]  Referring provider: Carlean Charter, DO [address] [place],  [place] [address]  Chief Complaint  Patient presents with   Follow-up   Urinary Incontinence    HPI: Patient last saw nurse practitioner 7 years ago was on Vesicare .  Had some prolapse symptoms.  Estrogen cream provided.   Today Patient has urge incontinence but does not leak with coughing sneezing.  No bedwetting.  3 heavier briefs a day that are soaked.  Voids every 1 hour and cannot hold it for 2 hours.  Voids hourly at night.  No ankle edema.  No diuretic   Has had 2 low back operations and hysterectomy   Recently went on Vesicare  and had dry mouth and retention symptoms   On further questioning patient was on Myrbetriq for years and she said it did not work.  In the last 6 months she says she has some suprapubic discomfort when she is full and during urination.  It is relieved when she voids     Patient has an overactive bladder with urge incontinence.  She has frequency and significant nocturia.  Reassess in 6 weeks on Gemtesa samples and prescription for pelvic examination and cystoscopy.  Call if culture positive.  She understands that a bladder infection would explain the discomfort but otherwise I will be performing cystoscopy for incontinence and discomfort with urination    Today Frequency stable.  Last culture positive. The patient said when I treated the bladder infection her frequency got a lot at her but she was still leaking some.  She said the [place] did not help her.  She says sometimes urine can be cloudy or foul-smelling but does not give a strong history of recurrent UTIs   On pelvic examination she had a well supported bladder neck and no prolapse or stress incontinence with a moderate cough after cystoscopy Cystoscopy: Patient underwent flexible cystoscopy.  Urine was cloudy with a lot of white flecks.  Bladder urothelium  within normal limits but she may have had some mild findings of cystitis cystica.  No carcinoma.     I would like to send in ciprofloxacin  250 mg twice a day for 7 days and have her come back in 6 to 8 weeks on daily trimethoprim . I am hoping this will down regulate some of her incontinence. It may explain why some of the medications have not helped her. Another trial of an antimuscarinic may be prudent. Third line therapies are options. She has not had urodynamics    Today Frequency stable last culture demonstrated gram-positive organisms. Patient says she is infection free.  She says she is 80% better in terms of her urge incontinence.  Reassess durability in 4 months. Call if culture positive. She has dramatically down regulated. She understands that medication in the past may not of help because of her infections.   Today Urgency incontinence dramatically better.  Still wears 2 pads a day.  Clinically infection free.  Frequency stable.   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   Diverticulitis    Dry eye syndrome of both eyes 08/13/2021   Dyspnea on exertion 08/13/2021   Family history of problems related to stress 11/15/2021   Frequency of urination and polyuria 06/25/2022   GAD (generalized anxiety disorder) 08/15/2021   Glaucoma    H/O urinary incontinence 06/25/2022   Hereditary peripheral neuropathy  06/25/2023   High risk medication use 08/02/2018   HLD (hyperlipidemia)    Iron deficiency 12/04/2019   Isolation (social) 12/07/2021   Kidney stones    Microscopic hematuria    Mixed incontinence    Morbid obesity (HCC)    Ocular hypertension, 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   Ocular migraine 08/02/2018   Osteoarthritis    lumbar spine   Osteoarthritis of right knee 08/28/2023   Palpitations 08/13/2021   Plantar fasciitis 06/25/2023   Post covid-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, 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   Postoperative follow-up 03/30/2020   Prediabetes 08/14/2015   Pseudophakia, both eyes 06/26/2016   Rectal bleeding 10/17/2021   Shortness of breath dyspnea    Sleep apnea    CPAP   Vaginal yeast infection    Wears dentures    partial upper    Surgical History: Past Surgical History:  Procedure Laterality Date   ABDOMINAL HYSTERECTOMY  09/08/1996   BACK SURGERY  1990/2014   CATARACT EXTRACTION, BILATERAL  09/08/2012   COLONOSCOPY N/A 12/17/2023   Procedure: COLONOSCOPY;  Surgeon: Marnee Sink, MD;  Location: [facility] ENDOSCOPY;  Service: Endoscopy;  Laterality: N/A;   COLONOSCOPY WITH PROPOFOL  N/A 03/25/2016   Procedure: COLONOSCOPY WITH PROPOFOL ;  Surgeon: Marnee Sink, MD;  Location: [facility] ENDOSCOPY;  Service: Endoscopy;  Laterality: N/A;   deviated nose septum surgery  09/08/1998   ESOPHAGOGASTRODUODENOSCOPY (EGD) WITH PROPOFOL  N/A 03/25/2016   Procedure: ESOPHAGOGASTRODUODENOSCOPY (EGD) WITH PROPOFOL ;  Surgeon: Marnee Sink, MD;  Location: [facility] ENDOSCOPY;  Service: Endoscopy;  Laterality: N/A;   EYE SURGERY     JOINT REPLACEMENT     LITHOTRIPSY  09/08/2010   POLYPECTOMY  12/17/2023   Procedure: POLYPECTOMY, INTESTINE;  Surgeon: Marnee Sink, MD;  Location: [facility] ENDOSCOPY;  Service: Endoscopy;;   TOTAL HIP ARTHROPLASTY Left     Home Medications:  Allergies as of 02/08/2024       Reactions   Morphine Hives        Medication List        Accurate as of February 08, 2024  1:36 PM. If you have any  questions, ask your nurse or doctor.          STOP taking these medications    albuterol  108 (90 Base) MCG/ACT inhaler Commonly known as: VENTOLIN  HFA   fluticasone  50 MCG/ACT nasal spray Commonly known as: FLONASE    Gabapentin  (Once-Daily) 300 MG Tabs   PROBIOTIC PO   topiramate  50 MG tablet Commonly known as: TOPAMAX    traZODone  50 MG tablet Commonly known as: DESYREL        TAKE these medications    atorvastatin  20 MG tablet Commonly known as: LIPITOR TAKE 1 TABLET(20 MG) BY MOUTH AT BEDTIME   Biotin 1 MG Caps Take 1 capsule by mouth daily.   budesonide -formoterol  160-4.5 MCG/ACT inhaler Commonly known as: SYMBICORT  Inhale 2 puffs into the lungs 2 (two) times daily.   CALCIUM  PO Take by mouth.   cetirizine  10 MG tablet Commonly known as: ZYRTEC   TAKE 1 TABLET BY MOUTH EVERY DAY   clotrimazole -betamethasone  cream Commonly known as: LOTRISONE  Apply 1 Application topically 2 (two) times daily.   DULoxetine  60 MG capsule Commonly known as: CYMBALTA  Take 1 capsule (60 mg total) by mouth daily. Reported on 12/26/2015   ipratropium 0.03 % nasal spray Commonly known as: ATROVENT  PLACE 2 SPRAYS INTO BOTH NOSTRILS EVERY 12 HOURS USE IN PLACE OF FLONASE    ipratropium-albuterol  0.5-2.5 (3) MG/3ML Soln Commonly known as: DUONEB Take 3 mLs by nebulization every 4 (four) hours as needed.   levothyroxine  75 MCG tablet Commonly known as: SYNTHROID  Take 1 tablet (75 mcg total) by mouth daily before breakfast.   meloxicam  15 MG tablet Commonly known as: MOBIC  Take 1 tablet by mouth daily.   montelukast  10 MG tablet Commonly known as: SINGULAIR  Take 1 tablet (10 mg total) by mouth at bedtime.   nystatin  powder Commonly known as: MYCOSTATIN /NYSTOP  Apply 1 Application topically 3 (three) times daily.   omeprazole  20 MG capsule Commonly known as: PRILOSEC TAKE 1 CAPSULE(20 MG) BY MOUTH TWICE DAILY BEFORE A MEAL   trimethoprim  100 MG tablet Commonly known  as: TRIMPEX  Take 1 tablet (100 mg total) by mouth daily.        Allergies:  Allergies  Allergen Reactions   Morphine Hives    Family History: Family History  Problem Relation Age of Onset   Depression Mother    Asthma Mother    Heart disease Mother    Diabetes Mother    Cancer Father        lung cancer   COPD Father    Liver cancer Father    Cancer Sister        Brain Cancer   Allergic rhinitis Sister    Asthma Sister    Depression Daughter    Hematuria Daughter    Bladder Cancer Neg Hx     Social History:  reports that she has never smoked. She has never been exposed to tobacco smoke. She has never used smokeless tobacco. She reports that she does not drink alcohol and does not use drugs.  ROS:                                        Physical Exam: BP 120/78   Pulse 61   Ht 5\' 2"  (1.575 m)   Wt 113.4 kg   BMI 45.73 kg/m   Constitutional:  Alert and oriented, No acute distress. HEENT: [place] AT, moist mucus membranes.  Trachea midline, no masses.   Laboratory Data: Lab Results  Component Value Date   WBC 11.3 (H) 10/16/2023   HGB 15.0 10/16/2023   HCT 46.4 10/16/2023   MCV 93 10/16/2023   PLT 429 10/16/2023    Lab Results  Component Value Date   CREATININE 1.00 07/20/2023    No results found for: "PSA"  No results found for: "TESTOSTERONE"  Lab Results  Component Value Date   HGBA1C 5.9 (H) 05/07/2023    Urinalysis    Component Value Date/Time   COLORURINE YELLOW (A) 03/04/2016 1920   APPEARANCEUR Clear 10/12/2023 1512   LABSPEC 1.014 03/04/2016 1920   LABSPEC 1.014 07/24/2014 1519   PHURINE 7.0 03/04/2016 1920   GLUCOSEU Negative 10/12/2023 1512   GLUCOSEU Negative 07/24/2014 1519   HGBUR NEGATIVE 03/04/2016 1920   BILIRUBINUR Negative 10/12/2023 1512   BILIRUBINUR Negative 07/24/2014 1519   KETONESUR NEGATIVE  03/04/2016 1920   PROTEINUR Negative 10/12/2023 1512   PROTEINUR NEGATIVE 03/04/2016 1920    UROBILINOGEN negative 12/06/2015 1448   NITRITE Negative 10/12/2023 1512   NITRITE NEGATIVE 03/04/2016 1920   LEUKOCYTESUR 1+ (A) 10/12/2023 1512   LEUKOCYTESUR 2+ 07/24/2014 1519    Pertinent Imaging:   Assessment & Plan: She thought it was reasonable to help her urge incontinence though she still pretty happy.  Reassess on trimethoprim  and Gemtesa samples and prescription in 6 weeks proceed accordingly  1. Urgency incontinence (Primary)  - Urinalysis, Complete   No follow-ups on file.  Devorah Fonder, MD  [facility] [address], [address] [place], [place] [address] [phone]

## 2024-02-09 ENCOUNTER — Other Ambulatory Visit: Payer: Self-pay | Admitting: Family Medicine

## 2024-02-15 ENCOUNTER — Ambulatory Visit (INDEPENDENT_AMBULATORY_CARE_PROVIDER_SITE_OTHER): Admitting: Professional Counselor

## 2024-02-15 DIAGNOSIS — F3341 Major depressive disorder, recurrent, in partial remission: Secondary | ICD-10-CM | POA: Diagnosis not present

## 2024-02-15 NOTE — Progress Notes (Signed)
  THERAPIST PROGRESS NOTE  Session Time: 3:00 PM - 3:44 PM   Participation Level: Active  Behavioral Response: Well Groomed, Alert, Dysphoric  Type of Therapy: Individual Therapy  Treatment Goals addressed: Active OP Depression  LTG: "I guess when things get to where I can say what I want to say. Stand up for myself. Accept and know I can't change her."     Start:  02/15/24    Expected End:  02/13/25    STG: To improve assertiveness AEB utilizing interpersonal effectiveness skills over the next 90 days.    STG: To reduce judgementalness AEB practicing mindfulness skills and focusing on what's within Anay's control over the next 90 days.   ProgressTowards Goals: Initial  Interventions: Motivational Interviewing, Solution Focused, and Supportive  Summary: Ruhi Kopke is a 73 y.o. female who presents with a history of depression and PTSD. She appeared dysphoric but oriented x5. She attended her son's wedding but didn't stay for the reception. Rickiya discussed the relationship with her daughter and struggles she has communicating with her. She engaged in developing her treatment plan. Kellin also expressed concerns about her cat. She was open to feedback from this writer and reported it did make her think about different solutions to this stressor.   Therapist Response: Conducted session with Lamond Pilot. Began session with check-in/update since previous session. Utilized empathetic and reflective listening. Developed treatment plan with input from Marshall on current strengths, needs, and progress towards goals. Explored solutions to current stressor with cat and provided different perspective on situation. Scheduled additional appointment and concluded session.   Suicidal/Homicidal: No  Plan: Return again in 2 weeks.  Diagnosis: MDD (major depressive disorder), recurrent, in partial remission (HCC)  Collaboration of Care: Medication Management AEB chart review  Patient/Guardian was  advised Release of Information must be obtained prior to any record release in order to collaborate their care with an outside provider. Patient/Guardian was advised if they have not already done so to contact the registration department to sign all necessary forms in order for us  to release information regarding their care.   Consent: Patient/Guardian gives verbal consent for treatment and assignment of benefits for services provided during this visit. Patient/Guardian expressed understanding and agreed to proceed.   Len Quale, [facility] 02/15/2024
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
## 2024-02-22 ENCOUNTER — Ambulatory Visit (INDEPENDENT_AMBULATORY_CARE_PROVIDER_SITE_OTHER): Admitting: Psychiatry

## 2024-02-22 ENCOUNTER — Ambulatory Visit: Payer: Self-pay

## 2024-02-22 ENCOUNTER — Ambulatory Visit: Admitting: Psychiatry

## 2024-02-22 ENCOUNTER — Encounter: Payer: Self-pay | Admitting: Psychiatry

## 2024-02-22 ENCOUNTER — Other Ambulatory Visit: Payer: Self-pay

## 2024-02-22 VITALS — BP 130/84 | HR 91 | Temp 95.7°F | Ht 62.0 in | Wt 251.8 lb

## 2024-02-22 DIAGNOSIS — F4310 Post-traumatic stress disorder, unspecified: Secondary | ICD-10-CM | POA: Diagnosis not present

## 2024-02-22 DIAGNOSIS — F3341 Major depressive disorder, recurrent, in partial remission: Secondary | ICD-10-CM

## 2024-02-22 DIAGNOSIS — G4700 Insomnia, unspecified: Secondary | ICD-10-CM

## 2024-02-22 MED ORDER — DULOXETINE HCL 30 MG PO CPEP: 30.0000 mg | ORAL_CAPSULE | Freq: Every day | ORAL | 1 refills | Status: DC
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
## 2024-02-22 NOTE — Telephone Encounter (Signed)
 FYI Only or Action Required?: FYI only for provider  Patient was last seen in primary care on 01/05/2024 by Mimi Alt, MD. Called Nurse Triage reporting Abdominal Pain. Symptoms began several weeks ago. Interventions attempted: Nothing. Symptoms are: gradually worsening.  Triage Disposition: See Physician Within 24 Hours  Patient/caregiver understands and will follow disposition?: No, wishes to speak with PCP  Copied from CRM [id number]. Topic: Clinical - Red Word Triage >> Feb 22, 2024 11:52 AM Hassie Lint wrote: Red Word that prompted transfer to Nurse Triage: patient states she has abdominal pain for the last few weeks. Says it comes and goes and the pain has stayed the same, not any better or worse. Reason for Disposition  [1] MODERATE pain (e.g., interferes with normal activities) AND [2] pain comes and goes (cramps) AND [3] present > 24 hours  (Exception: Pain with Vomiting or Diarrhea - see that Guideline.)  Answer Assessment - Initial Assessment Questions 1. LOCATION: Where does it hurt?      LLQ 2. RADIATION: Does the pain shoot anywhere else? (e.g., chest, back)     denies 3. ONSET: When did the pain begin? (e.g., minutes, hours or days ago)      Couple weeks 4. SUDDEN: Gradual or sudden onset?     sudden 5. PATTERN Does the pain come and go, or is it constant?    - If it comes and goes: How long does it last? Do you have pain now?     (Note: Comes and goes means the pain is intermittent. It goes away completely between bouts.)    - If constant: Is it getting better, staying the same, or getting worse?      (Note: Constant means the pain never goes away completely; most serious pain is constant and gets worse.)      intermittent 6. SEVERITY: How bad is the pain?  (e.g., Scale 1-10; mild, moderate, or severe)    - MILD (1-3): Doesn't interfere with normal activities, abdomen soft and not tender to touch.     - MODERATE (4-7): Interferes with normal  activities or awakens from sleep, abdomen tender to touch.     - SEVERE (8-10): Excruciating pain, doubled over, unable to do any normal activities.       Fine right now, yesterday was bad 7. RECURRENT SYMPTOM: Have you ever had this type of stomach pain before? If Yes, ask: When was the last time? and What happened that time?      States diverticulitis in the past caused this pain 8. CAUSE: What do you think is causing the stomach pain?     I'm not sure, 9. RELIEVING/AGGRAVATING FACTORS: What makes it better or worse? (e.g., antacids, bending or twisting motion, bowel movement)     BM sometimes makes pain better 10. OTHER SYMPTOMS: Do you have any other symptoms? (e.g., back pain, diarrhea, fever, urination pain, vomiting)       Black stools, pt states she believes it was from the green portion of turnips.   Pt refusing appts tomorrow with PCP d/t other appts. Pt will only see PCP, scheduled for 02/29/24. Pt was advised numerous times that waiting to be seen was not recommended, pt understands.  Protocols used: Abdominal Pain - Female-A-AH

## 2024-02-22 NOTE — Patient Instructions (Signed)
 Increase duloxetine  90 mg daily  Decrease hydroxyzine  25 mg at night one week then discontinue Next appointment: 7/24 at 10:30

## 2024-02-23 ENCOUNTER — Ambulatory Visit: Admitting: Family Medicine

## 2024-02-29 ENCOUNTER — Ambulatory Visit (INDEPENDENT_AMBULATORY_CARE_PROVIDER_SITE_OTHER): Admitting: Professional Counselor

## 2024-02-29 ENCOUNTER — Encounter: Payer: Self-pay | Admitting: Family Medicine

## 2024-02-29 ENCOUNTER — Ambulatory Visit: Admitting: Family Medicine

## 2024-02-29 VITALS — BP 115/57 | HR 100 | Resp 20 | Ht 62.0 in | Wt 251.1 lb

## 2024-02-29 DIAGNOSIS — R32 Unspecified urinary incontinence: Secondary | ICD-10-CM

## 2024-02-29 DIAGNOSIS — G4733 Obstructive sleep apnea (adult) (pediatric): Secondary | ICD-10-CM

## 2024-02-29 DIAGNOSIS — F3341 Major depressive disorder, recurrent, in partial remission: Secondary | ICD-10-CM

## 2024-02-29 DIAGNOSIS — Z6841 Body Mass Index (BMI) 40.0 and over, adult: Secondary | ICD-10-CM

## 2024-02-29 DIAGNOSIS — F4310 Post-traumatic stress disorder, unspecified: Secondary | ICD-10-CM | POA: Diagnosis not present

## 2024-02-29 DIAGNOSIS — K5792 Diverticulitis of intestine, part unspecified, without perforation or abscess without bleeding: Secondary | ICD-10-CM

## 2024-02-29 DIAGNOSIS — M1990 Unspecified osteoarthritis, unspecified site: Secondary | ICD-10-CM

## 2024-02-29 DIAGNOSIS — R1032 Left lower quadrant pain: Secondary | ICD-10-CM

## 2024-02-29 DIAGNOSIS — Z713 Dietary counseling and surveillance: Secondary | ICD-10-CM

## 2024-02-29 MED ORDER — TIRZEPATIDE-WEIGHT MANAGEMENT 2.5 MG/0.5ML ~~LOC~~ SOLN: 2.5000 mg | SUBCUTANEOUS | 0 refills | Status: DC

## 2024-02-29 MED ORDER — SULFAMETHOXAZOLE-TRIMETHOPRIM 800-160 MG PO TABS
1.0000 | ORAL_TABLET | Freq: Two times a day (BID) | ORAL | 0 refills | Status: DC
Start: 2024-02-29 — End: 2024-04-20

## 2024-02-29 MED ORDER — METRONIDAZOLE 500 MG PO TABS: 500.0000 mg | ORAL_TABLET | Freq: Two times a day (BID) | ORAL | 0 refills | Status: DC

## 2024-02-29 NOTE — Progress Notes (Signed)
  THERAPIST PROGRESS NOTE  Session Time: 3:00 PM - 3:48 PM   Participation Level: Active  Behavioral Response: Casual, Alert, Euthymic  Type of Therapy: Individual Therapy  Treatment Goals addressed: Active OP Depression  LTG: I guess when things get to where I can say what I want to say. Stand up for myself. Accept and know I can't change her.                 Start:  02/15/24    Expected End:  02/13/25     STG: To improve assertiveness AEB utilizing interpersonal effectiveness skills over the next 90 days.     STG: To reduce judgementalness AEB practicing mindfulness skills and focusing on what's within [name]'s control over the next 90 days.  ProgressTowards Goals: Progressing  Interventions: Motivational Interviewing and Supportive  Summary: Faith Padilla is a 73 y.o. female who presents with a history of depression and PTSD. She appeared alert and oriented x5. She reported her daughter called her after her last session and they had a good, long talk about things. [name] reported it has been helpful in clearing the air between them. She discussed updates at her housing community and shared the activities she enjoys partaking in. [name] also reported that her cat is doing better. Overall, she has seen improvement in her symptoms and day-to-day.   Therapist Response: Conducted session with Faith Padilla. Began session with check-in/update since previous session. Utilized empathetic and reflective listening. Used open-ended questions to facilitate discussion and summarized Aparna's thoughts/feelings. Scheduled additional appointment and concluded session.   Suicidal/Homicidal: No  Plan: Return again in 4 weeks.  Diagnosis: PTSD (post-traumatic stress disorder)  MDD (major depressive disorder), recurrent, in partial remission (HCC)  Collaboration of Care: Medication Management AEB chart review  Patient/Guardian was advised Release of Information must be obtained prior to any record  release in order to collaborate their care with an outside provider. Patient/Guardian was advised if they have not already done so to contact the registration department to sign all necessary forms in order for us  to release information regarding their care.   Consent: Patient/Guardian gives verbal consent for treatment and assignment of benefits for services provided during this visit. Patient/Guardian expressed understanding and agreed to proceed.   Almarie JONETTA Ligas, [facility] 02/29/2024

## 2024-02-29 NOTE — Progress Notes (Signed)
 Established patient visit   Patient: Faith Padilla   DOB: Sep 05, 1951   73 y.o. Female  MRN: [id number] Visit Date: 02/29/2024  Today's healthcare provider: LAURAINE LOISE BUOY, DO   Chief Complaint  Patient presents with   Abdominal Pain    LLQ pain comes and goes 2 weeks, lasting for about few minutes.    Subjective    Abdominal Pain Pertinent negatives include no constipation, diarrhea, fever or nausea.   Faith Padilla is a 73 year old female with diverticulosis who presents with ongoing abdominal pain.  She has been experiencing intermittent abdominal pain for the past two weeks, with each episode lasting a few minutes. The pain is located on the same side as her previous diverticulitis episodes. No associated fevers or nausea. She has constipation, with stools being more firm and round than usual. She suspects her diet, particularly the consumption of nuts, might be contributing to her symptoms. She has not had an episode of diverticulitis in approximately two years and is currently taking trimethoprim  for UTI prophylaxis.  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 She completed a course of prednisone , which, along with a muscle relaxer, has alleviated her pain, allowing her to stand without discomfort. She is scheduled to start meloxicam  after finishing prednisone .  She is concerned about her weight and appetite, noting a habitual craving for sweets after meals. She uses a CPAP machine for sleep apnea and has previously taken topiramate  for weight loss without benefit. She is currently on duloxetine  for depression, taking a total of 90 mg daily, and hydroxyzine  for sleep, both of which are being managed by psychiatry.  She reports psychiatry recently increased the duloxetine  dose to try to reduce her reliance on hydroxyzine  at night.      Medications: Outpatient Medications Prior to Visit  Medication Sig    atorvastatin  (LIPITOR) 20 MG tablet TAKE 1 TABLET BY MOUTH EVERY DAY   Biotin 1 MG CAPS Take 1 capsule by mouth daily.   budesonide -formoterol  (SYMBICORT ) 160-4.5 MCG/ACT inhaler Inhale 2 puffs into the lungs 2 (two) times daily.   CALCIUM  PO Take by mouth.   cetirizine  (ZYRTEC ) 10 MG tablet TAKE 1 TABLET BY MOUTH EVERY DAY   clotrimazole -betamethasone  (LOTRISONE ) cream Apply 1 Application topically 2 (two) times daily.   DULoxetine  (CYMBALTA ) 30 MG capsule Take 1 capsule (30 mg total) by mouth daily. Total of 90 mg daily. Take along with 60 mg cap   DULoxetine  (CYMBALTA ) 60 MG capsule Take 1 capsule (60 mg total) by mouth daily. Reported on 12/26/2015   ipratropium (ATROVENT ) 0.03 % nasal spray PLACE 2 SPRAYS INTO BOTH NOSTRILS EVERY 12 HOURS USE IN PLACE OF FLONASE    ipratropium-albuterol  (DUONEB) 0.5-2.5 (3) MG/3ML SOLN Take 3 mLs by nebulization every 4 (four) hours as needed.   levothyroxine  (SYNTHROID ) 75 MCG tablet Take 1 tablet (75 mcg total) by mouth daily before breakfast.   meloxicam  (MOBIC ) 15 MG tablet Take 1 tablet by mouth daily.   methocarbamol (ROBAXIN) 500 MG tablet Take 500 mg by mouth in the morning and at bedtime.   methylPREDNISolone  (MEDROL  DOSEPAK) 4 MG TBPK tablet Take by mouth as directed.   montelukast  (SINGULAIR ) 10 MG tablet Take 1 tablet (10 mg total) by mouth at bedtime.   nystatin  (MYCOSTATIN /NYSTOP ) powder Apply 1 Application topically 3 (three) times daily.   omeprazole  (PRILOSEC) 20 MG capsule TAKE 1 CAPSULE(20 MG) BY MOUTH TWICE  DAILY BEFORE A MEAL   trimethoprim  (TRIMPEX ) 100 MG tablet Take 1 tablet (100 mg total) by mouth daily.   Vibegron  (GEMTESA ) 75 MG TABS Take 1 tablet (75 mg total) by mouth daily for 28 days.   Vibegron  (GEMTESA ) 75 MG TABS Take 1 tablet (75 mg total) by mouth daily.   No facility-administered medications prior to visit.    Review of Systems  Constitutional:  Negative for chills and fever.  Respiratory:  Negative for shortness of  breath.   Cardiovascular:  Negative for chest pain.  Gastrointestinal:  Positive for abdominal pain. Negative for constipation, diarrhea and nausea.  Genitourinary:  Negative for flank pain.        Objective    BP (!) 115/57 (BP Location: Left Arm, Patient Position: Sitting, Cuff Size: Large)   Pulse 100   Resp 20   Ht 5' 2 (1.575 m)   Wt 251 lb 1.6 oz (113.9 kg)   SpO2 95%   BMI 45.93 kg/m     Physical Exam Constitutional:      Appearance: Normal appearance.  HENT:     Head: Normocephalic and atraumatic.   Eyes:     General: No scleral icterus.    Extraocular Movements: Extraocular movements intact.     Conjunctiva/sclera: Conjunctivae normal.    Cardiovascular:     Rate and Rhythm: Normal rate and regular rhythm.     Pulses: Normal pulses.     Heart sounds: Normal heart sounds.  Pulmonary:     Effort: Pulmonary effort is normal. No respiratory distress.     Breath sounds: Normal breath sounds.  Abdominal:     General: Bowel sounds are normal. There is no distension.     Palpations: Abdomen is soft. There is no mass.     Tenderness: There is no abdominal tenderness (during exam). There is no guarding.   Musculoskeletal:     Right lower leg: No edema.     Left lower leg: No edema.   Skin:    General: Skin is warm and dry.   Neurological:     Mental Status: She is alert and oriented to person, place, and time. Mental status is at baseline.   Psychiatric:        Mood and Affect: Mood normal.        Behavior: Behavior normal.      No results found for any visits on 02/29/24.  Assessment & Plan    Left lower quadrant abdominal pain -     POCT urinalysis dipstick  Diverticulitis -     Sulfamethoxazole-Trimethoprim ; Take 1 tablet by mouth 2 (two) times daily. PAUSE trimethoprim  while taking this medication.  Dispense: 10 tablet; Refill: 0 -     metroNIDAZOLE ; Take 1 tablet (500 mg total) by mouth 2 (two) times daily.  Dispense: 10 tablet; Refill:  0  Arthritis  Urinary incontinence, unspecified type  Obstructive sleep apnea on CPAP -     Tirzepatide-Weight Management; Inject 2.5 mg into the skin once a week.  Dispense: 2 mL; Refill: 0  Morbid obesity with BMI of 45.0-49.9, adult (HCC)  Encounter for weight loss counseling     Left lower quadrant abdominal Pain Ongoing abdominal pain similar to previous diverticulitis but less severe, possibly due to current antibiotics. Considering empiric treatment based on symptoms. - Consider empiric antibiotic treatment with Bactrim and metronidazole  or Augmentin . - Encourage Greek yogurt or probiotics between antibiotics to prevent resistant infections.  Arthritis Arthritis pain now in shoulder and  neck. Prednisone  and muscle relaxer provided temporary relief. Meloxicam  prescribed for continued management. - Continue meloxicam  after prednisone . - Consider wrist splint for hand pain. - Advise follow-up if symptoms persist.  Urinary Incontinence Improved urinary incontinence with Gemtesa , on trimethoprim  to prevent infection.  Has not yet picked up and started Gemtesa  prescription (using samples currently). - Pick up and start Gemtesa  prescription.  Obstructive sleep apnea Patient has a history of obstructive sleep apnea managed with a CPAP.  She has comorbid morbid obesity and will likely benefit from treatment of her OSA with Zepbound. - Prescribe Zepbound as noted  Morbid obesity; weight loss counseling Seeks weight management assistance.  As noted above, will prescribe Zepbound for patient's OSA, which will have additional benefit for patient's weight.  She has previously tried topiramate .  Also considered bupropion/naltrexone; however, she is currently on high-dose duloxetine , which places her at significant risk for serotonin syndrome if started on Wellbutrin.  If needed, will trial naltrexone alone to facilitate weight loss. - If Zepbound not covered for patient's OSA, consider  naltrexone alone. - Encourage ongoing lifestyle modifications to reduce sugar and overall caloric intake.  Patient's mobility and ability to exercise is fairly limited by her chronic arthritis.    Return in about 2 months (around 04/30/2024) for Chronic f/u.      I discussed the assessment and treatment plan with the patient  The patient was provided an opportunity to ask questions and all were answered. The patient agreed with the plan and demonstrated an understanding of the instructions.   The patient was advised to call back or seek an in-person evaluation if the symptoms worsen or if the condition fails to improve as anticipated.    LAURAINE LOISE BUOY, DO  [facility] [phone] (phone) [phone] (fax)  [facility]

## 2024-02-29 NOTE — Patient Instructions (Addendum)
 Pause trimethoprim  while taking bactrim (sulfamethoxazole/trimethoprim )

## 2024-03-20 ENCOUNTER — Other Ambulatory Visit: Payer: Self-pay | Admitting: Family Medicine

## 2024-03-20 DIAGNOSIS — J3489 Other specified disorders of nose and nasal sinuses: Secondary | ICD-10-CM

## 2024-03-21 ENCOUNTER — Telehealth: Payer: Self-pay

## 2024-03-21 NOTE — Telephone Encounter (Signed)
 pt left a message that she can not take the extra 30mg  of the cymbalta . she is no energy and is sweating alot. she states she does not want to take it is there anything else she can take. pt last seen on 4-28 next appt 8-25

## 2024-03-21 NOTE — Telephone Encounter (Signed)
 Please advise her to reduce the dose of duloxetine  60 mg daily to see if that helps ease her symptoms. I would recommend holding off on any further medication changes for now. We can reassess and talk through everything more thoroughly at her next visit.

## 2024-03-21 NOTE — Telephone Encounter (Signed)
 pt was notified with dr. vickey orders.  Pt was also reminded of her appt date.

## 2024-03-21 NOTE — Telephone Encounter (Signed)
 Sorry wrong dates were give. Pt was last seen on  6-16 next appt 7-24

## 2024-03-22 ENCOUNTER — Other Ambulatory Visit: Payer: Self-pay | Admitting: Family Medicine

## 2024-03-22 DIAGNOSIS — K219 Gastro-esophageal reflux disease without esophagitis: Secondary | ICD-10-CM

## 2024-03-22 DIAGNOSIS — Z713 Dietary counseling and surveillance: Secondary | ICD-10-CM

## 2024-03-24 NOTE — Telephone Encounter (Signed)
 Requested medication (s) are due for refill today:   Yes  Requested medication (s) are on the active medication list:   Yes  Future visit scheduled:   No  LOV 02/29/2024   Last ordered: 01/26/2024 30 ml, 0 refill  No protocol assigned to this medication     Requested Prescriptions  Pending Prescriptions Disp Refills   ipratropium (ATROVENT ) 0.03 % nasal spray [Pharmacy Med Name: IPRATROPIUM 0.03% NAS SP30ML (345)] 30 mL 0    Sig: PLACE 2 SPRAYS INTO BOTH NOSTRILS EVERY 12 HOURS USE IN PLACE OF FLONASE      Off-Protocol Failed - 03/24/2024  1:05 PM      Failed - Medication not assigned to a protocol, review manually.      Passed - Valid encounter within last 12 months    Recent Outpatient Visits           3 weeks ago Left lower quadrant abdominal pain   [facility] [place], Lauraine SAILOR, DO   2 months ago Non-recurrent acute serous otitis media of left ear   [facility] Simmons[place]Rob[place], [place], MD   5 months ago Dehydration   [facility] [place], Lauraine SAILOR, DO   5 months ago Acute cough   [facility] [place], Lauraine SAILOR, DO       Future Appointments             In 1 month Gaston Hamilton, MD [facility] [place]           Off-Protocol Failed - 03/24/2024  1:05 PM      Failed - Medication not assigned to a protocol, review manually.      Passed - Valid encounter within last 12 months    Recent Outpatient Visits           3 weeks ago Left lower quadrant abdominal pain   [facility] [place], Lauraine SAILOR, DO   2 months ago Non-recurrent acute serous otitis media of left ear   [facility] Simmons-Robinson, [place], MD   5 months ago Dehydration   [facility] [place], Lauraine SAILOR, DO   5 months ago Acute cough   [facility] [place], Lauraine SAILOR, DO       Future  Appointments             In 1 month MacDiarmid, Hamilton, MD [facility]

## 2024-03-24 NOTE — Telephone Encounter (Signed)
 [facility] called regarding this refill, please advise

## 2024-03-27 ENCOUNTER — Other Ambulatory Visit: Payer: Self-pay | Admitting: Family Medicine
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
## 2024-03-28 ENCOUNTER — Ambulatory Visit (INDEPENDENT_AMBULATORY_CARE_PROVIDER_SITE_OTHER): Admitting: Professional Counselor

## 2024-03-28 DIAGNOSIS — F4310 Post-traumatic stress disorder, unspecified: Secondary | ICD-10-CM

## 2024-03-28 DIAGNOSIS — F3341 Major depressive disorder, recurrent, in partial remission: Secondary | ICD-10-CM | POA: Diagnosis not present

## 2024-03-28 NOTE — Progress Notes (Signed)
  THERAPIST PROGRESS NOTE  Session Time: 10:00 AM - 10:53 AM   Participation Level: Active  Behavioral Response: Well Groomed, Alert, Euthymic  Type of Therapy: Individual Therapy  Treatment Goals addressed: Active OP Depression  LTG: I guess when things get to where I can say what I want to say. Stand up for myself. Accept and know I can't change her.                 Start:  02/15/24    Expected End:  02/13/25     STG: To improve assertiveness AEB utilizing interpersonal effectiveness skills over the next 90 days.     STG: To reduce judgementalness AEB practicing mindfulness skills and focusing on what's within Ellerie's control over the next 90 days.  ProgressTowards Goals: Progressing  Interventions: Motivational Interviewing and Supportive  Summary: Ricki Vanhandel is a 73 y.o. female who presents with a history of depression and PTSD. She appeared alert and oriented x5. She reported things are going well. She noted an issue with one of her medications. She noted it was helpful with her mood but caused a negative side effect (sweating). She hopes they can find another option. Yarah reported things continue to go well with her daughter. They enjoyed a nice dinner last Friday along with the grand kids. Aby expressed a struggle with going to they gym. She is working on finances and paying down debt. She was receptive to input from Conservation officer, nature on getting a new vehicle.   Therapist Response: Conducted session with Kyra. Began session with check-in/update since previous session. Utilized empathetic and reflective listening. Used open-ended questions to facilitate discussion and summarized thoughts/feelings. Explored obstacles to going to the gym and financial stability. Scheduled additional appointment and concluded session.   Suicidal/Homicidal: No  Plan: Return again in 1 weeks.  Diagnosis: MDD (major depressive disorder), recurrent, in partial remission (HCC)  PTSD  (post-traumatic stress disorder)  Collaboration of Care: Medication Management AEB chart review  Patient/Guardian was advised Release of Information must be obtained prior to any record release in order to collaborate their care with an outside provider. Patient/Guardian was advised if they have not already done so to contact the registration department to sign all necessary forms in order for us  to release information regarding their care.   Consent: Patient/Guardian gives verbal consent for treatment and assignment of benefits for services provided during this visit. Patient/Guardian expressed understanding and agreed to proceed.   Almarie JONETTA Ligas, [facility] 03/28/2024

## 2024-03-29 ENCOUNTER — Telehealth: Payer: Self-pay

## 2024-03-29 NOTE — Telephone Encounter (Signed)
 Patient called and said she is having a lot of urine  frequency especially at night. Patient stated she is on trimplex but through out the Gemtessa because it wasn't working per patient. We advised to limited PM fluid intake and to keep her upcoming appointment on 8/18.

## 2024-03-31 ENCOUNTER — Ambulatory Visit (INDEPENDENT_AMBULATORY_CARE_PROVIDER_SITE_OTHER): Admitting: Psychiatry

## 2024-03-31 ENCOUNTER — Encounter: Payer: Self-pay | Admitting: Psychiatry

## 2024-03-31 VITALS — BP 126/84 | HR 95 | Temp 97.9°F | Ht 62.0 in | Wt 256.2 lb

## 2024-03-31 DIAGNOSIS — F4310 Post-traumatic stress disorder, unspecified: Secondary | ICD-10-CM

## 2024-03-31 DIAGNOSIS — F3341 Major depressive disorder, recurrent, in partial remission: Secondary | ICD-10-CM | POA: Diagnosis not present

## 2024-03-31 DIAGNOSIS — G4700 Insomnia, unspecified: Secondary | ICD-10-CM

## 2024-03-31 MED ORDER — VENLAFAXINE HCL ER 37.5 MG PO CP24: 37.5000 mg | ORAL_CAPSULE | Freq: Every day | ORAL | 0 refills | Status: DC

## 2024-03-31 MED ORDER — VENLAFAXINE HCL ER 75 MG PO CP24: 75.0000 mg | ORAL_CAPSULE | Freq: Every day | ORAL | 1 refills | Status: DC

## 2024-03-31 NOTE — Patient Instructions (Signed)
 Decrease duloxetine  30 mg daily for one week, then discontinue  Start venlafaxine  37.5 mg daily for one week, then 75 mg daily  Next appointment: 9/8 at 10:30

## 2024-04-06 ENCOUNTER — Ambulatory Visit: Admitting: Primary Care

## 2024-04-09 ENCOUNTER — Other Ambulatory Visit: Payer: Self-pay

## 2024-04-09 ENCOUNTER — Emergency Department

## 2024-04-09 ENCOUNTER — Emergency Department
Admission: EM | Admit: 2024-04-09 | Discharge: 2024-04-09 | Disposition: A | Discharge status: Home / Self Care | Attending: Emergency Medicine | Admitting: Emergency Medicine

## 2024-04-09 DIAGNOSIS — J189 Pneumonia, unspecified organism: Secondary | ICD-10-CM

## 2024-04-09 DIAGNOSIS — J181 Lobar pneumonia, unspecified organism: Secondary | ICD-10-CM | POA: Diagnosis not present

## 2024-04-09 DIAGNOSIS — J84112 Idiopathic pulmonary fibrosis: Secondary | ICD-10-CM | POA: Insufficient documentation

## 2024-04-09 DIAGNOSIS — R0602 Shortness of breath: Secondary | ICD-10-CM | POA: Diagnosis present

## 2024-04-09 LAB — BASIC METABOLIC PANEL WITH GFR
Anion gap: 10 (ref 5–15)
BUN: 19 mg/dL (ref 8–23)
CO2: 26 mmol/L (ref 22–32)
Calcium: 9.9 mg/dL (ref 8.9–10.3)
Chloride: 104 mmol/L (ref 98–111)
Creatinine, Ser: 0.97 mg/dL (ref 0.44–1.00)
GFR, Estimated: >60 mL/min (ref >60)
Glucose, Bld: 104 mg/dL — ABNORMAL HIGH (ref 70–99)
Potassium: 4.8 mmol/L (ref 3.5–5.1)
Sodium: 140 mmol/L (ref 135–145)

## 2024-04-09 LAB — CBC
HCT: 43.5 % (ref 36.0–46.0)
Hemoglobin: 14.3 g/dL (ref 12.0–15.0)
MCH: 31 pg (ref 26.0–34.0)
MCHC: 32.9 g/dL (ref 30.0–36.0)
MCV: 94.2 fL (ref 80.0–100.0)
Platelets: 321 K/uL (ref 150–400)
RBC: 4.62 MIL/uL (ref 3.87–5.11)
RDW: 13.2 % (ref 11.5–15.5)
WBC: 7.3 K/uL (ref 4.0–10.5)
nRBC: 0 % (ref 0.0–0.2)

## 2024-04-09 LAB — RESP PANEL BY RT-PCR (RSV, FLU A&B, COVID)  RVPGX2
Influenza A by PCR: NEGATIVE
Influenza B by PCR: NEGATIVE
Resp Syncytial Virus by PCR: NEGATIVE
SARS Coronavirus 2 by RT PCR: NEGATIVE

## 2024-04-09 LAB — BRAIN NATRIURETIC PEPTIDE: B Natriuretic Peptide: 12.4 pg/mL (ref 0.0–100.0)

## 2024-04-09 MED ORDER — PREDNISONE 50 MG PO TABS: 50.0000 mg | ORAL_TABLET | Freq: Every day | ORAL | 0 refills | Status: DC

## 2024-04-09 MED ORDER — PREDNISONE 20 MG PO TABS
60.0000 mg | ORAL_TABLET | Freq: Once | ORAL | Status: AC
  Administered 2024-04-09: 60 mg via ORAL
  Filled 2024-04-09: qty 3

## 2024-04-09 MED ORDER — DOXYCYCLINE MONOHYDRATE 100 MG PO TABS: 100.0000 mg | ORAL_TABLET | Freq: Two times a day (BID) | ORAL | 0 refills | Status: DC

## 2024-04-09 MED ORDER — IPRATROPIUM-ALBUTEROL 0.5-2.5 (3) MG/3ML IN SOLN
6.0000 mL | Freq: Once | RESPIRATORY_TRACT | Status: AC
  Administered 2024-04-09: 6 mL via RESPIRATORY_TRACT
  Filled 2024-04-09: qty 9

## 2024-04-09 MED ORDER — DOXYCYCLINE HYCLATE 100 MG PO TABS
100.0000 mg | ORAL_TABLET | Freq: Once | ORAL | Status: AC
  Administered 2024-04-09: 100 mg via ORAL
  Filled 2024-04-09: qty 1

## 2024-04-09 NOTE — Discharge Instructions (Signed)
 You were seen in the emergency department for a cough.  Your chest x-ray showed a questionable pneumonia to your right lower lungs.  You were given a breathing treatment, steroids and an antibiotic.  You can take your next dose of antibiotic tonight.  Take your next dose of steroids tomorrow.  You can use your breathing treatments every 4-6 hours as needed for shortness of breath and wheezing.  Follow-up closely with your primary care provider.  Return to the emergency department for any worsening symptoms.  Prednisone  -you are given a prescription for a steroid.  It is important that you take this medication with food.  This medication can cause an upset stomach.  It also can increase your glucose if you have a history of diabetes, so it is important that you check your glucose frequently while you are on this medication.  Doxycycline  - This medication can cause acid reflux.  It is important that you take it with food and drink plenty of water.  Do not lie down for 1 hour after taking this medication.  It also causes sun sensitivity so stay out of the sun or wear SPF while on this medication.

## 2024-04-09 NOTE — ED Triage Notes (Signed)
 Pt to ED for cough since 3 days, hx pulmonary fibrosis. States intermittent SOB with walking. Skin is dry. Coughing in triage.

## 2024-04-09 NOTE — ED Provider Notes (Signed)
 [facility] Provider Note    Event Date/Time   First MD Initiated Contact with Patient 04/09/24 1157     (approximate)   History   Shortness of Breath and Cough   HPI  Faith Padilla is a 73 y.o. female past medical history significant for idiopathic pulmonary fibrosis, presents to the emergency department with cough.  States that she has had a cough for the past 3 days.  Endorses some mild shortness of breath.  Denies fever, chills, nausea, vomiting or diaphoresis.  Denies any chest pain.  No recent antibiotic use for pneumonia but does state that she has been taking a new medication for urinary incontinence.  Does not believe that she has been on an antibiotic for incontinence.  No recent travel.  Denies history of DVT or PE.     Physical Exam   Triage Vital Signs: ED Triage Vitals  Encounter Vitals Group     BP 04/09/24 1124 120/85     Girls Systolic BP Percentile --      Girls Diastolic BP Percentile --      Boys Systolic BP Percentile --      Boys Diastolic BP Percentile --      Pulse Rate 04/09/24 1120 (!) 108     Resp 04/09/24 1120 20     Temp 04/09/24 1120 98.1 F (36.7 C)     Temp Source 04/09/24 1120 Oral     SpO2 04/09/24 1120 98 %     Weight 04/09/24 1122 253 lb (114.8 kg)     Height 04/09/24 1122 5' 2 (1.575 m)     Head Circumference --      Peak Flow --      Pain Score 04/09/24 1121 0     Pain Loc --      Pain Education --      Exclude from Growth Chart --     Most recent vital signs: Vitals:   04/09/24 1124 04/09/24 1354  BP: 120/85 137/66  Pulse:  81  Resp:  (!) 24  Temp:    SpO2:  99%    Physical Exam Constitutional:      Appearance: She is well-developed.  HENT:     Head: Atraumatic.  Eyes:     Conjunctiva/sclera: Conjunctivae normal.  Cardiovascular:     Rate and Rhythm: Regular rhythm.  Pulmonary:     Effort: No respiratory distress.     Breath sounds: Wheezing and rhonchi present.  Abdominal:      General: There is no distension.     Palpations: Abdomen is soft.     Tenderness: There is no abdominal tenderness.  Musculoskeletal:        General: Normal range of motion.     Cervical back: Normal range of motion.     Right lower leg: No edema.     Left lower leg: No edema.  Skin:    General: Skin is warm.     Capillary Refill: Capillary refill takes less than 2 seconds.  Neurological:     Mental Status: She is alert. Mental status is at baseline.     IMPRESSION / MDM / ASSESSMENT AND PLAN / ED COURSE  I reviewed the triage vital signs and the nursing notes.  Differential diagnosis including acute exacerbation of her idiopathic pulmonary fibrosis, pneumonia, ACS, anemia, viral illness including COVID/influenza  Does have end expiratory wheeze on exam.  Will give a DuoNeb treatment    No tachycardic or bradycardic dysrhythmias  while on cardiac telemetry.  RADIOLOGY I independently reviewed imaging, my interpretation of imaging: Chest x-ray with hiatal hernia.  Streaky opacity to the right lung base.  LABS (all labs ordered are listed, but only abnormal results are displayed) Labs interpreted as -    Labs Reviewed  BASIC METABOLIC PANEL WITH GFR - Abnormal; Notable for the following components:      Result Value   Glucose, Bld 104 (*)    All other components within normal limits  RESP PANEL BY RT-PCR (RSV, FLU A&B, COVID)  RVPGX2  CBC  BRAIN NATRIURETIC PEPTIDE     MDM  Lab work overall reassuring reassuring with no significant leukocytosis or anemia.  Creatinine is at her baseline.  COVID influenza testing are negative.  Normal BNP and have a low suspicion for heart failure exacerbation.  No chest pain no exertional symptoms, have low suspicion for ACS.  Given streaky opacity at the right lung base with a new cough will cover the patient for community-acquired pneumonia.  Patient given DuoNeb treatments and started on steroids.  Will start the patient on  doxycycline .  Discussed return to the emergency department for any ongoing or worsening symptoms.  Discussed close follow-up with primary care physician and her pulmonologist.     PROCEDURES:  Critical Care performed: No  Procedures  Patient's presentation is most consistent with acute presentation with potential threat to life or bodily function.   MEDICATIONS ORDERED IN ED: Medications  ipratropium-albuterol  (DUONEB) 0.5-2.5 (3) MG/3ML nebulizer solution 6 mL (6 mLs Nebulization Given 04/09/24 1330)  predniSONE  (DELTASONE ) tablet 60 mg (60 mg Oral Given 04/09/24 1329)  doxycycline  (VIBRA -TABS) tablet 100 mg (100 mg Oral Given 04/09/24 1330)    FINAL CLINICAL IMPRESSION(S) / ED DIAGNOSES   Final diagnoses:  Community acquired pneumonia of right lower lobe of lung  Acute exacerbation of idiopathic pulmonary fibrosis (HCC)     Rx / DC Orders   ED Discharge Orders          Ordered    doxycycline  (ADOXA) 100 MG tablet  2 times daily        04/09/24 1325    predniSONE  (DELTASONE ) 50 MG tablet  Daily with breakfast        04/09/24 1325             Note:  This document was prepared using Dragon voice recognition software and may include unintentional dictation errors.   Suzanne Kirsch, MD 04/09/24 [phone]

## 2024-04-09 NOTE — ED Notes (Signed)
Pt assisted to toilet 

## 2024-04-11 ENCOUNTER — Ambulatory Visit: Payer: Self-pay

## 2024-04-11 NOTE — Telephone Encounter (Signed)
 FYI Only or Action Required?: Action required by provider: request for appointment.  Patient was last seen in primary care on 02/29/2024 by Faith Lauraine SAILOR, DO.  Called Nurse Triage reporting Fatigue.  Symptoms began yesterday.  Interventions attempted: Nothing.  Symptoms are: gradually worsening.  Triage Disposition: See Physician Within 24 Hours  Patient/caregiver understands and will follow disposition?: Yes, will follow disposition  Copied from CRM #[id number]. Topic: Clinical - Red Word Triage >> Apr 11, 2024 12:04 PM Faith Padilla wrote: Red Word that prompted transfer to Nurse Triage: Pt is experiencing extreme fatigue and sob. Pt was as  [facility] at [facility] for pneumonia Reason for Disposition  [1] MODERATE weakness (e.g., interferes with work, school, normal activities) AND [2] persists > 3 days  Answer Assessment - Initial Assessment Questions 1. DESCRIPTION: Describe how you are feeling.     More tired than when in the ED 2. SEVERITY: How bad is it?  Can you stand and walk?     States can still stand and walk 3. ONSET: When did these symptoms begin? (e.g., hours, days, weeks, months)     States the tiredness increased yesterday 4. CAUSE: What do you think is causing the weakness or fatigue? (e.g., not drinking enough fluids, medical problem, trouble sleeping)     Worsening pneumonia 6. OTHER SYMPTOMS: Do you have any other symptoms? (e.g., chest pain, fever, cough, SOB, vomiting, diarrhea, bleeding, other areas of pain)     Denies worsening SOB, states she is unsure if she is having a fever  Protocols used: Weakness (Generalized) and Fatigue-A-AH

## 2024-04-13 NOTE — Telephone Encounter (Signed)
 Called patient, advised of Dr. Greggory message. Was not able to find appt at [facility] but did find on with Dr. Bernardo at [facility]. Patient informed me that she was able to schedule f/u with her pulmonologist. States she will see how that goes and if she feels worse or needs to be seen again will call us . Patient was asking about a cardiology referral as well- states they told her something in the ER about needing to see a cardiologist. Maybe something to do with chest xray when they dx her with pneumonia?

## 2024-04-14 ENCOUNTER — Ambulatory Visit (INDEPENDENT_AMBULATORY_CARE_PROVIDER_SITE_OTHER)

## 2024-04-14 ENCOUNTER — Ambulatory Visit (INDEPENDENT_AMBULATORY_CARE_PROVIDER_SITE_OTHER): Admitting: Nurse Practitioner

## 2024-04-14 ENCOUNTER — Ambulatory Visit: Admitting: Professional Counselor

## 2024-04-14 ENCOUNTER — Encounter (HOSPITAL_BASED_OUTPATIENT_CLINIC_OR_DEPARTMENT_OTHER): Payer: Self-pay | Admitting: Nurse Practitioner

## 2024-04-14 VITALS — BP 114/63 | HR 82 | Ht 62.0 in | Wt <= 1120 oz

## 2024-04-14 DIAGNOSIS — R0600 Dyspnea, unspecified: Secondary | ICD-10-CM | POA: Diagnosis not present

## 2024-04-14 DIAGNOSIS — J209 Acute bronchitis, unspecified: Secondary | ICD-10-CM

## 2024-04-14 DIAGNOSIS — J8410 Pulmonary fibrosis, unspecified: Secondary | ICD-10-CM | POA: Diagnosis not present

## 2024-04-14 DIAGNOSIS — J189 Pneumonia, unspecified organism: Secondary | ICD-10-CM

## 2024-04-14 DIAGNOSIS — R059 Cough, unspecified: Secondary | ICD-10-CM | POA: Diagnosis not present

## 2024-04-14 DIAGNOSIS — J4540 Moderate persistent asthma, uncomplicated: Secondary | ICD-10-CM | POA: Diagnosis not present

## 2024-04-14 LAB — CBC WITH DIFFERENTIAL/PLATELET
Basophils Absolute: 0.1 x10E3/uL (ref 0.0–0.2)
Basos: 1 %
EOS (ABSOLUTE): 0 x10E3/uL (ref 0.0–0.4)
Eos: 0 %
Hematocrit: 43 % (ref 34.0–46.6)
Hemoglobin: 13.9 g/dL (ref 11.1–15.9)
Immature Grans (Abs): 0.1 x10E3/uL (ref 0.0–0.1)
Immature Granulocytes: 1 %
Lymphocytes Absolute: 1.1 x10E3/uL (ref 0.7–3.1)
Lymphs: 10 %
MCH: 30.7 pg (ref 26.6–33.0)
MCHC: 32.3 g/dL (ref 31.5–35.7)
MCV: 95 fL (ref 79–97)
Monocytes Absolute: 0.8 x10E3/uL (ref 0.1–0.9)
Monocytes: 7 %
Neutrophils Absolute: 8.8 x10E3/uL — ABNORMAL HIGH (ref 1.4–7.0)
Neutrophils: 81 %
Platelets: 348 x10E3/uL (ref 150–450)
RBC: 4.53 x10E6/uL (ref 3.77–5.28)
RDW: 13 % (ref 11.7–15.4)
WBC: 10.9 x10E3/uL — ABNORMAL HIGH (ref 3.4–10.8)

## 2024-04-14 MED ORDER — METHYLPREDNISOLONE ACETATE 80 MG/ML IJ SUSP
120.0000 mg | Freq: Once | INTRAMUSCULAR | Status: AC
  Administered 2024-04-14: 120 mg via INTRAMUSCULAR

## 2024-04-14 MED ORDER — ALBUTEROL SULFATE (2.5 MG/3ML) 0.083% IN NEBU
2.5000 mg | INHALATION_SOLUTION | RESPIRATORY_TRACT | Status: AC
  Administered 2024-04-14: 2.5 mg via RESPIRATORY_TRACT

## 2024-04-14 MED ORDER — PREDNISONE 10 MG PO TABS: ORAL_TABLET | ORAL | 0 refills | Status: DC

## 2024-04-14 MED ORDER — AMOXICILLIN-POT CLAVULANATE 875-125 MG PO TABS: 1.0000 | ORAL_TABLET | Freq: Two times a day (BID) | ORAL | 0 refills | Status: DC

## 2024-04-14 NOTE — Progress Notes (Signed)
 "  @Patient  ID: Faith Padilla, female    DOB: Jun 13, 1951, 73 y.o.   MRN: [id number]  Chief Complaint  Patient presents with   Follow-up    ER follow-up Pneumonia 04/09/2024    Referring provider: Donzella Lauraine SAILOR, DO  HPI: 73 year old female, never smoker followed for postinflammatory pulmonary fibrosis and chronic respiratory failure, chronic cough.  She is a patient Dr. Laymond and last seen in office 11/03/2023.  Past medical history significant for OSA on CPAP, hiatal hernia, GERD, hypothyroid, CKD, prediabetes, fibromyalgia, depression.  TEST/EVENTS:  08/18/2023 CT chest: Atherosclerosis.  Enlarged PA.  Upper and mid lung zone predominant interstitial coarsening and groundglass with traction bronchiectasis/bronchiolectasis, similar to 2022.  Minimal scattered subpleural reticular densities.  No honeycombing.  Not consistent with UIP.  Large hiatal hernia. 11/03/2023 PFT: FVC 85, FEV1 98, ratio 86, TLC 96, DLCO 107.  No BD.  Normal lung function 04/09/2024 CXR: Streaky opacity at right lung base.  Mild prominent reticular opacities at left lung base.  Large hiatal hernia  11/03/2023: OV with Dr. Kassie.  Participating in pulmonary rehab.  Received her oxygen in December.  Not needing it while exercising.  Completed PFTs.  Had bronchitis 2 weeks ago and was treated with steroids and Symbicort .  Stop using the Symbicort .  Has been coughing with deep breaths and exercises.  Prior CT with stable fibrosis and PFTs demonstrating normal results which is suggestive of improving postviral/COVID inflammation.  Cough likely multifactorial.  Encouraged to restart bronchodilators to see if this improves symptoms.  04/14/2024: Today-Hospital follow-up Discussed the use of AI scribe software for clinical note transcription with the patient, who gave verbal consent to proceed.  History of Present Illness Faith Padilla is a 73 year old female with post COVID fibrosis and chronic respiratory failure who presents  with worsening respiratory symptoms.  She visited the emergency department five days ago and was prescribed doxycycline  and prednisone . Initially, she felt a slight improvement yesterday but feels worse today, describing a decline in her condition.  She experiences a productive cough with yellow sputum but no hemoptysis. She has noticed occasional low oxygen levels at home, dropping to 85%. She does not use oxygen regularly at baseline but does have it at home.   She completed her course of prednisone  this morning and uses a Symbicort  inhaler twice daily, sometimes more frequently. She also performs breathing treatments twice a day, although they cause significant jitteriness. Last one this morning.   No fever and she maintains her eating and drinking habits. She has been monitoring her oxygen levels since her emergency department visit.  Her symptoms began a week ago on Wednesday, prior to her emergency department visit. She reports sinus drainage with yellow discharge, which is improving, but her lung symptoms are worsening. No leg swelling, calf pain/tenderness, CP.     Allergies  Allergen Reactions   Morphine Hives    Immunization History  Administered Date(s) Administered   Fluad Trivalent(High Dose 65+) 07/20/2023   Fluzone Influenza virus vaccine,trivalent (IIV3), split virus 10/03/2012   Influenza, Mdck, Trivalent,PF 6+ MOS(egg free) 09/07/2013   Influenza, Seasonal, Injecte, Preservative Fre 08/22/2014, 05/14/2021   Influenza,inj,Quad PF,6+ Mos 10/03/2012, 08/14/2015   Influenza-Unspecified 10/03/2012, 09/07/2013, 08/22/2014, 08/14/2015   Moderna Sars-Covid-2 Vaccination 02/17/2020, 03/16/2020   Pneumococcal Polysaccharide-23 09/09/2011, 10/03/2012, 05/14/2021   Tdap 09/08/2004, 08/21/2011, 02/22/2021    Past Medical History:  Diagnosis Date   Abdominal pain, right upper quadrant    Abdominal pain, RUQ (right upper quadrant) 03/12/2016  Achilles tendinitis of right lower  extremity 06/25/2023   Acid reflux    Allergic conjunctivitis of both eyes 06/29/2017   Anemia 12/04/2019   Anxiety and depression    Asthma    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) 08/13/2021   Complication of anesthesia    slow to awaken, wakes coughing and SOB   COPD (chronic obstructive pulmonary disease) (HCC)    Depression    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   Diverticulitis    Dry eye syndrome of both eyes 08/13/2021   Dyspnea on exertion 08/13/2021   Family history of problems related to stress 11/15/2021   Frequency of urination and polyuria 06/25/2022   GAD (generalized anxiety disorder) 08/15/2021   Glaucoma    H/O urinary incontinence 06/25/2022   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   High risk medication use 08/02/2018   HLD (hyperlipidemia)    Iron deficiency 12/04/2019   Isolation (social) 12/07/2021   Kidney stones    Microscopic hematuria    Mixed incontinence    Morbid obesity (HCC)    Ocular hypertension, 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   Ocular migraine 08/02/2018   Osteoarthritis    lumbar spine   Osteoarthritis of right knee 08/28/2023   Palpitations 08/13/2021   Plantar fasciitis 06/25/2023   Post covid-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, 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   Postoperative follow-up 03/30/2020   Prediabetes 08/14/2015   Pseudophakia, both eyes 06/26/2016   Pulmonary fibrosis (HCC) 2020   Rectal bleeding 10/17/2021   Shortness of breath dyspnea    Sleep apnea    CPAP   Vaginal yeast infection    Wears dentures    partial upper    Tobacco History: Social History   Tobacco Use  Smoking Status Never   Passive exposure: Never (Parent)  Smokeless Tobacco Never   Counseling given: Not Answered   Outpatient Medications Prior to Visit  Medication Sig Dispense Refill   atorvastatin  (LIPITOR) 20 MG tablet TAKE 1 TABLET BY MOUTH EVERY DAY 90 tablet 0   Biotin 1 MG CAPS Take 1 capsule  by mouth daily.     budesonide -formoterol  (SYMBICORT ) 160-4.5 MCG/ACT inhaler Inhale 2 puffs into the lungs 2 (two) times daily. 1 each 3   CALCIUM  PO Take by mouth.     cetirizine  (ZYRTEC ) 10 MG tablet TAKE 1 TABLET BY MOUTH EVERY DAY 90 tablet 0   clotrimazole -betamethasone  (LOTRISONE ) cream Apply 1 Application topically 2 (two) times daily. 30 g 0   ipratropium (ATROVENT ) 0.03 % nasal spray PLACE 2 SPRAYS INTO BOTH NOSTRILS EVERY 12 HOURS USE IN PLACE OF FLONASE  30 mL 5   ipratropium-albuterol  (DUONEB) 0.5-2.5 (3) MG/3ML SOLN Take 3 mLs by nebulization every 4 (four) hours as needed. 360 mL 3   levothyroxine  (SYNTHROID ) 75 MCG tablet Take 1 tablet (75 mcg total) by mouth daily before breakfast. 90 tablet 3   meloxicam  (MOBIC ) 15 MG tablet Take 1 tablet by mouth daily.     montelukast  (SINGULAIR ) 10 MG tablet TAKE 1 TABLET(10 MG) BY MOUTH AT BEDTIME 90 tablet 2   omeprazole  (PRILOSEC) 20 MG capsule TAKE 1 CAPSULE(20 MG) BY MOUTH TWICE DAILY BEFORE A MEAL 180 capsule 0   trimethoprim  (TRIMPEX ) 100 MG tablet Take 1 tablet (100 mg total) by mouth daily. 30 tablet 11   venlafaxine  XR (EFFEXOR -XR) 75 MG 24 hr capsule Take 1 capsule (75 mg total) by mouth daily with breakfast. Start after completing 37.5 mg daily for one week 30 capsule 1  doxycycline  (ADOXA) 100 MG tablet Take 1 tablet (100 mg total) by mouth 2 (two) times daily for 7 days. 14 tablet 0   predniSONE  (DELTASONE ) 50 MG tablet Take 1 tablet (50 mg total) by mouth daily with breakfast for 5 days. 5 tablet 0   DULoxetine  (CYMBALTA ) 30 MG capsule Take 1 capsule (30 mg total) by mouth daily. Total of 90 mg daily. Take along with 60 mg cap (Patient not taking: Reported on 04/14/2024) 30 capsule 1   DULoxetine  (CYMBALTA ) 60 MG capsule Take 1 capsule (60 mg total) by mouth daily. Reported on 12/26/2015 (Patient not taking: Reported on 04/14/2024) 90 capsule 3   methocarbamol (ROBAXIN) 500 MG tablet Take 500 mg by mouth in the morning and at bedtime.  (Patient not taking: Reported on 04/14/2024)     methylPREDNISolone  (MEDROL  DOSEPAK) 4 MG TBPK tablet Take by mouth as directed. (Patient not taking: Reported on 04/14/2024)     metroNIDAZOLE  (FLAGYL ) 500 MG tablet Take 1 tablet (500 mg total) by mouth 2 (two) times daily. (Patient not taking: Reported on 04/14/2024) 10 tablet 0   nystatin  (MYCOSTATIN /NYSTOP ) powder Apply 1 Application topically 3 (three) times daily. (Patient not taking: Reported on 04/14/2024) 60 g 3   sulfamethoxazole -trimethoprim  (BACTRIM  DS) 800-160 MG tablet Take 1 tablet by mouth 2 (two) times daily. PAUSE trimethoprim  while taking this medication. (Patient not taking: Reported on 04/14/2024) 10 tablet 0   tirzepatide  (ZEPBOUND ) 2.5 MG/0.5ML injection vial Inject 2.5 mg into the skin once a week. (Patient not taking: Reported on 04/14/2024) 2 mL 0   venlafaxine  XR (EFFEXOR -XR) 37.5 MG 24 hr capsule Take 1 capsule (37.5 mg total) by mouth daily with breakfast for 7 days. (Patient not taking: Reported on 04/14/2024) 7 capsule 0   Vibegron  (GEMTESA ) 75 MG TABS Take 1 tablet (75 mg total) by mouth daily. (Patient not taking: Reported on 04/14/2024) 30 tablet 11   No facility-administered medications prior to visit.     Review of Systems:   Constitutional: No weight loss or gain, night sweats, fevers, chills, +fatigue, lassitude. HEENT: No headaches, difficulty swallowing, tooth/dental problems, or sore throat. No sneezing, itching, ear ache +nasal congestion, post nasal drip CV:  No chest pain, orthopnea, PND, swelling in lower extremities, anasarca, dizziness, palpitations, syncope Resp: + shortness of breath with exertion; wheezing; productive cough. No hemoptysis. No chest wall deformity GI:  No heartburn, indigestion, abdominal pain, nausea, vomiting, diarrhea, change in bowel habits, loss of appetite, bloody stools.  GU: No dysuria, change in color of urine, urgency or frequency.  No flank pain, no hematuria  Skin: No rash, lesions,  ulcerations MSK:  No joint pain or swelling.   Neuro: No dizziness or lightheadedness.  Psych: No depression or anxiety. Mood stable.     Physical Exam:  BP 114/63   Pulse 82   Ht 5' 2 (1.575 m)   Wt 26 lb 2.1 oz (11.9 kg)   SpO2 92%   BMI 4.78 kg/m   GEN: Pleasant, interactive, acutely-ill appearing; obese; in no acute distress HEENT:  Normocephalic and atraumatic. PERRLA. Sclera white. Nasal turbinates pale, moist and patent bilaterally. No rhinorrhea present. Oropharynx pink and moist, without exudate or edema. No lesions, ulcerations, or postnasal drip.  NECK:  Supple w/ fair ROM. No JVD present. No lymphadenopathy.   CV: RRR, no m/r/g, no peripheral edema. Pulses intact, +2 bilaterally. No cyanosis, pallor or clubbing. PULMONARY:  Unlabored, regular breathing. Rhonchi LLL with scattered wheezes bilaterally A&P. No accessory muscle use.  GI: BS present and normoactive. Soft, non-tender to palpation. No organomegaly or masses detected. MSK: No erythema, warmth or tenderness. Cap refil <2 sec all extrem.  Neuro: A/Ox3. No focal deficits noted.   Skin: Warm, no lesions or rashe Psych: Normal affect and behavior. Judgement and thought content appropriate.     Lab Results:  CBC    Component Value Date/Time   WBC 10.9 (H) 04/14/2024 1557   WBC 7.3 04/09/2024 1125   RBC 4.53 04/14/2024 1557   RBC 4.62 04/09/2024 1125   HGB 13.9 04/14/2024 1557   HCT 43.0 04/14/2024 1557   PLT 348 04/14/2024 1557   MCV 95 04/14/2024 1557   MCV 79 (L) 09/01/2014 0607   MCH 30.7 04/14/2024 1557   MCH 31.0 04/09/2024 1125   MCHC 32.3 04/14/2024 1557   MCHC 32.9 04/09/2024 1125   RDW 13.0 04/14/2024 1557   RDW 17.4 (H) 09/01/2014 0607   LYMPHSABS 1.1 04/14/2024 1557   LYMPHSABS 1.1 09/01/2014 0607   MONOABS 0.9 09/01/2014 0607   EOSABS 0.0 04/14/2024 1557   EOSABS 0.0 09/01/2014 0607   BASOSABS 0.1 04/14/2024 1557   BASOSABS 0.0 09/01/2014 0607    BMET    Component Value  Date/Time   NA 140 04/09/2024 1125   NA 139 07/20/2023 1051   NA 139 08/30/2014 2233   K 4.8 04/09/2024 1125   K 4.2 08/30/2014 2233   CL 104 04/09/2024 1125   CL 107 08/30/2014 2233   CO2 26 04/09/2024 1125   CO2 22 08/30/2014 2233   GLUCOSE 104 (H) 04/09/2024 1125   GLUCOSE 87 08/30/2014 2233   BUN 19 04/09/2024 1125   BUN 26 07/20/2023 1051   BUN 13 08/30/2014 2233   CREATININE 0.97 04/09/2024 1125   CREATININE 1.26 08/30/2014 2233   CALCIUM  9.9 04/09/2024 1125   CALCIUM  8.6 08/30/2014 2233   GFRNONAA >60 04/09/2024 1125   GFRNONAA 46 (L) 08/30/2014 2233   GFRNONAA 53 (L) 04/09/2014 1915   GFRAA 87 03/12/2016 1001   GFRAA 55 (L) 08/30/2014 2233   GFRAA >60 04/09/2014 1915    BNP    Component Value Date/Time   BNP 12.4 04/09/2024 1125     Imaging:  DG Chest 2 View Result Date: 04/14/2024 CLINICAL DATA:  worsening DOE/productive cough EXAM: CHEST - 2 VIEW COMPARISON:  None available. FINDINGS: No focal airspace consolidation, pleural effusion, or pneumothorax. Mild cardiomegaly. Large hiatal hernia. No acute fracture or destructive lesion. Multilevel degenerative disc disease of the spine. IMPRESSION: No acute cardiopulmonary abnormality. Electronically Signed   By: Rogelia Myers M.D.   On: 04/14/2024 16:23   DG Chest 2 View Result Date: 04/09/2024 EXAM: 2 VIEW(S) XRAY OF THE CHEST 04/09/2024 11:47:00 AM COMPARISON: PA and lateral radiographs of the chest dated 10/16/2023. CLINICAL HISTORY: Cough with a history of pulmonary fibrosis. Intermittent shortness of breath with walking. Dry skin. Coughing in triage. FINDINGS: LUNGS AND PLEURA: Streaky opacity present within the right lung base. Mildly prominent reticular opacities also present within the left lung base. No pleural effusion. No pneumothorax. HEART AND MEDIASTINUM: No acute abnormality of the cardiac and mediastinal silhouettes. Large hiatus hernia demonstrated. BONES AND SOFT TISSUES: No acute osseous abnormality.  IMPRESSION: 1. Streaky opacity in the right lung base and mildly prominent reticular opacities in the left lung base, possibly related to the patient's history of pulmonary fibrosis. 2. Large hiatus hernia. Electronically signed by: evalene coho 04/09/2024 11:56 AM EDT RP Workstation: [id number]    albuterol  (PROVENTIL ) (  2.5 MG/3ML) 0.083% nebulizer solution 2.5 mg     Date Action Dose Route User   04/14/2024 1614 Given 2.5 mg Nebulization Williams, Jamie L, CMA      methylPREDNISolone  acetate (DEPO-MEDROL ) injection 120 mg     Date Action Dose Route User   04/14/2024 1612 Given 120 mg Intramuscular (Right Quadriceps) Trudy Warren CROME, CMA          Latest Ref Rng & Units 11/03/2023    1:30 PM  PFT Results  FVC-Pre L 2.27   FVC-Predicted Pre % 85   FVC-Post L 2.37   FVC-Predicted Post % 89   Pre FEV1/FVC % % 86   Post FEV1/FCV % % 86   FEV1-Pre L 1.95   FEV1-Predicted Pre % 98   FEV1-Post L 2.05   DLCO uncorrected ml/min/mmHg 20.22   DLCO UNC% % 112   DLCO corrected ml/min/mmHg 19.33   DLCO COR %Predicted % 107   DLVA Predicted % 117   TLC L 4.58   TLC % Predicted % 96   RV % Predicted % 75     No results found for: NITRICOXIDE      Assessment & Plan:   CAP (community acquired pneumonia) Increased opacity in RLL on recent imaging with stable changes in left lung. Initiated on doxycycline  and prednisone  for CAP/fibrosis flare 8/2. She has a hx of postinflammatory fibrosis, not UIP, and asthma. Failure to improve with acute worsening over the last 24 hours. Able to maintain saturations on room air in office today. Will recheck CXR to evaluate for worsening superimposed infection, check CBC for leukocytosis. Initiate empiric abx with augmentin  and extend prednisone  taper. Depo 120 mg inj x 1 and albuterol  neb in office today with some improvement in aeration. Action plan in place. Strict hospital/return precautions.   Patient Instructions  Continue Albuterol  inhaler  2 puffs or duoneb 3 mL neb every 6 hours as needed for shortness of breath or wheezing. Notify if symptoms persist despite rescue inhaler/neb use. Use nebs 2-3 times a day until symptoms improve Continue Symbicort  2 puffs Twice daily. Brush tongue and rinse mouth afterwards Continue zyrtec  1 tab daily Continue atrovent  nasal spray Twice daily for nasal congestion/drainage Continue montelukast  1 tab daily   Steroid shot today Prednisone  taper. 4 tabs for 3 days, then 3 tabs for 3 days, 2 tabs for 3 days, then 1 tab for 3 days, then stop. Take in AM with food. Augmentin  1 tab Twice daily for 7 days. Take with food.  Guaifenesin 600 mg Twice daily for cough/congestion  Chest x ray Labs today  If you are not better in 24-48 hours, or you get worse, go to the hospital   Follow up in 2 weeks with Dr. Kassie or Katie Marelin Tat,NP. If symptoms do not improve or worsen, please contact office for sooner follow up or seek emergency care.     Acute bronchitis See above  Moderate persistent asthma with allergic rhinitis Consider step up to triple therapy at follow up. See above plan  Pulmonary fibrosis (HCC) Felt to be postinfectious/inflammatory and not progressive on prior imaging. Normal PFTs.   Advised if symptoms do not improve or worsen, to please contact office for sooner follow up or seek emergency care.   I spent 45 minutes of dedicated to the care of this patient on the date of this encounter to include pre-visit review of records, face-to-face time with the patient discussing conditions above, post visit ordering of testing, clinical documentation with  the electronic health record, making appropriate referrals as documented, and communicating necessary findings to members of the patients care team.  Comer LULLA Rouleau, NP 04/15/2024  Pt aware and understands NP's role.   "

## 2024-04-14 NOTE — Patient Instructions (Addendum)
 Continue Albuterol  inhaler 2 puffs or duoneb 3 mL neb every 6 hours as needed for shortness of breath or wheezing. Notify if symptoms persist despite rescue inhaler/neb use. Use nebs 2-3 times a day until symptoms improve Continue Symbicort  2 puffs Twice daily. Brush tongue and rinse mouth afterwards Continue zyrtec  1 tab daily Continue atrovent  nasal spray Twice daily for nasal congestion/drainage Continue montelukast  1 tab daily   Steroid shot today Prednisone  taper. 4 tabs for 3 days, then 3 tabs for 3 days, 2 tabs for 3 days, then 1 tab for 3 days, then stop. Take in AM with food. Augmentin  1 tab Twice daily for 7 days. Take with food.  Guaifenesin 600 mg Twice daily for cough/congestion  Chest x ray Labs today  If you are not better in 24-48 hours, or you get worse, go to the hospital   Follow up in 2 weeks with Faith Padilla or Faith Rehan Holness,Faith Padilla. If symptoms do not improve or worsen, please contact office for sooner follow up or seek emergency care.

## 2024-04-15 ENCOUNTER — Encounter (HOSPITAL_BASED_OUTPATIENT_CLINIC_OR_DEPARTMENT_OTHER): Payer: Self-pay | Admitting: Nurse Practitioner

## 2024-04-15 ENCOUNTER — Ambulatory Visit: Payer: Self-pay | Admitting: Nurse Practitioner

## 2024-04-15 DIAGNOSIS — J189 Pneumonia, unspecified organism: Secondary | ICD-10-CM | POA: Insufficient documentation

## 2024-04-15 NOTE — Assessment & Plan Note (Signed)
 Consider step up to triple therapy at follow up. See above plan

## 2024-04-15 NOTE — Telephone Encounter (Signed)
 Called and spoke to the pt she stated she didn't need a referral she wanted to see if Dr SHAUNNA had any recommendations of a local cardiologist. She would like to be evaluated after receiving her XR results

## 2024-04-15 NOTE — Assessment & Plan Note (Signed)
 See above

## 2024-04-15 NOTE — Assessment & Plan Note (Signed)
 Felt to be postinfectious/inflammatory and not progressive on prior imaging. Normal PFTs.

## 2024-04-15 NOTE — Assessment & Plan Note (Signed)
 Increased opacity in RLL on recent imaging with stable changes in left lung. Initiated on doxycycline  and prednisone  for CAP/fibrosis flare 8/2. She has a hx of postinflammatory fibrosis, not UIP, and asthma. Failure to improve with acute worsening over the last 24 hours. Able to maintain saturations on room air in office today. Will recheck CXR to evaluate for worsening superimposed infection, check CBC for leukocytosis. Initiate empiric abx with augmentin  and extend prednisone  taper. Depo 120 mg inj x 1 and albuterol  neb in office today with some improvement in aeration. Action plan in place. Strict hospital/return precautions.   Patient Instructions  Continue Albuterol  inhaler 2 puffs or duoneb 3 mL neb every 6 hours as needed for shortness of breath or wheezing. Notify if symptoms persist despite rescue inhaler/neb use. Use nebs 2-3 times a day until symptoms improve Continue Symbicort  2 puffs Twice daily. Brush tongue and rinse mouth afterwards Continue zyrtec  1 tab daily Continue atrovent  nasal spray Twice daily for nasal congestion/drainage Continue montelukast  1 tab daily   Steroid shot today Prednisone  taper. 4 tabs for 3 days, then 3 tabs for 3 days, 2 tabs for 3 days, then 1 tab for 3 days, then stop. Take in AM with food. Augmentin  1 tab Twice daily for 7 days. Take with food.  Guaifenesin 600 mg Twice daily for cough/congestion  Chest x ray Labs today  If you are not better in 24-48 hours, or you get worse, go to the hospital   Follow up in 2 weeks with Dr. Kassie or Katie Freyja Govea,NP. If symptoms do not improve or worsen, please contact office for sooner follow up or seek emergency care.

## 2024-04-18 NOTE — Addendum Note (Signed)
 Addended by: CHERRY CHIQUITA HERO on: 04/18/2024 10:39 AM   Modules accepted: Orders

## 2024-04-19 ENCOUNTER — Ambulatory Visit: Payer: Self-pay

## 2024-04-19 NOTE — Telephone Encounter (Signed)
 FYI Only or Action Required?: Action required by provider: request for appointment.  Patient was last seen in primary care on 02/29/2024 by Donzella Lauraine SAILOR, DO.  Called Nurse Triage reporting Dizziness.  Symptoms began several days ago.  Interventions attempted: Nothing.  Symptoms are: gradually worsening. States she has dizziness but it is worse, has vertigo too. 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 Has chest heaviness at times  when this happens. Instructed to go to ED if chest pain returns.  Triage Disposition: See Physician Within 24 Hours  Patient/caregiver understands and will follow disposition?: Yes    Copied from CRM #[id number]. Topic: Clinical - Red Word Triage >> Apr 19, 2024  3:38 PM Myrick T wrote: Red Word that prompted transfer to Nurse Triage: Patient said she feel dizzy and its gets worse when she stand up. This has been going on for a couple of days. Reason for Disposition  [1] MODERATE dizziness (e.g., interferes with normal activities) AND [2] has NOT been evaluated by doctor (or NP/PA) for this  (Exception: Dizziness caused by heat exposure, sudden standing, or poor fluid intake.)  Answer Assessment - Initial Assessment Questions 1. DESCRIPTION: Describe your dizziness.     Dizzy 2. LIGHTHEADED: Do you feel lightheaded? (e.g., somewhat faint, woozy, weak upon standing)     yes 3. VERTIGO: Do you feel like either you or the room is spinning or tilting? (i.e., vertigo)     yes 4. SEVERITY: How bad is it?  Do you feel like you are going to faint? Can you stand and walk?     Has to hold on to walk 5. ONSET:  When did the dizziness begin?     A few days 6. AGGRAVATING FACTORS: Does anything make it worse? (e.g., standing, change in head position)     walking 7. HEART RATE: Can you tell me your heart rate? How many beats in 15 seconds?  (Note: Not all patients can do this.)       no 8. CAUSE: What do you think is causing the dizziness? (e.g.,  decreased fluids or food, diarrhea, emotional distress, heat exposure, new medicine, sudden standing, vomiting; unknown)     unsure 9. RECURRENT SYMPTOM: Have you had dizziness before? If Yes, ask: When was the last time? What happened that time?     yes 10. OTHER SYMPTOMS: Do you have any other symptoms? (e.g., fever, chest pain, vomiting, diarrhea, bleeding)       no 11. PREGNANCY: Is there any chance you are pregnant? When was your last menstrual period?       no  Protocols used: Dizziness - Lightheadedness-A-AH

## 2024-04-20 ENCOUNTER — Emergency Department

## 2024-04-20 ENCOUNTER — Ambulatory Visit: Admitting: Family Medicine

## 2024-04-20 ENCOUNTER — Other Ambulatory Visit: Payer: Self-pay

## 2024-04-20 ENCOUNTER — Emergency Department
Admission: EM | Admit: 2024-04-20 | Discharge: 2024-04-20 | Disposition: A | Discharge status: Home / Self Care | Attending: Emergency Medicine | Admitting: Emergency Medicine

## 2024-04-20 DIAGNOSIS — J441 Chronic obstructive pulmonary disease with (acute) exacerbation: Secondary | ICD-10-CM

## 2024-04-20 DIAGNOSIS — R42 Dizziness and giddiness: Secondary | ICD-10-CM | POA: Diagnosis present

## 2024-04-20 DIAGNOSIS — E860 Dehydration: Secondary | ICD-10-CM | POA: Diagnosis not present

## 2024-04-20 LAB — BASIC METABOLIC PANEL WITH GFR
Anion gap: 10 (ref 5–15)
BUN: 28 mg/dL — ABNORMAL HIGH (ref 8–23)
CO2: 27 mmol/L (ref 22–32)
Calcium: 9.5 mg/dL (ref 8.9–10.3)
Chloride: 101 mmol/L (ref 98–111)
Creatinine, Ser: 1.05 mg/dL — ABNORMAL HIGH (ref 0.44–1.00)
GFR, Estimated: 56 mL/min — ABNORMAL LOW (ref >60)
Glucose, Bld: 98 mg/dL (ref 70–99)
Potassium: 4.6 mmol/L (ref 3.5–5.1)
Sodium: 138 mmol/L (ref 135–145)

## 2024-04-20 LAB — CBC
HCT: 46.5 % — ABNORMAL HIGH (ref 36.0–46.0)
Hemoglobin: 15.2 g/dL — ABNORMAL HIGH (ref 12.0–15.0)
MCH: 30.6 pg (ref 26.0–34.0)
MCHC: 32.7 g/dL (ref 30.0–36.0)
MCV: 93.8 fL (ref 80.0–100.0)
Platelets: 405 K/uL — ABNORMAL HIGH (ref 150–400)
RBC: 4.96 MIL/uL (ref 3.87–5.11)
RDW: 13.3 % (ref 11.5–15.5)
WBC: 15.6 K/uL — ABNORMAL HIGH (ref 4.0–10.5)
nRBC: 0 % (ref 0.0–0.2)

## 2024-04-20 LAB — TROPONIN I (HIGH SENSITIVITY): Troponin I (High Sensitivity): 5 ng/L (ref <18)

## 2024-04-20 MED ORDER — BENZONATATE 100 MG PO CAPS
200.0000 mg | ORAL_CAPSULE | Freq: Once | ORAL | Status: AC
  Administered 2024-04-20 (×2): 200 mg via ORAL
  Filled 2024-04-20: qty 2

## 2024-04-20 MED ORDER — IPRATROPIUM-ALBUTEROL 0.5-2.5 (3) MG/3ML IN SOLN
3.0000 mL | Freq: Once | RESPIRATORY_TRACT | Status: AC
  Administered 2024-04-20 (×2): 3 mL via RESPIRATORY_TRACT
  Filled 2024-04-20: qty 3

## 2024-04-20 MED ORDER — SODIUM CHLORIDE 0.9 % IV BOLUS
1000.0000 mL | Freq: Once | INTRAVENOUS | Status: AC
  Administered 2024-04-20 (×2): 1000 mL via INTRAVENOUS

## 2024-04-20 MED ORDER — BENZONATATE 100 MG PO CAPS: 100.0000 mg | ORAL_CAPSULE | Freq: Three times a day (TID) | ORAL | 0 refills | Status: DC | PRN

## 2024-04-20 NOTE — ED Notes (Signed)
 ED Provider at bedside.

## 2024-04-20 NOTE — ED Provider Notes (Signed)
 [facility] Emergency Department Provider Note     Event Date/Time   First MD Initiated Contact with Patient 04/20/24 1723     (approximate)   History   Shortness of Breath   HPI  Faith Padilla is a 73 y.o. female with a history of glaucoma, HLD, OSA, COPD, GERD presents to the ED for evaluation of dizziness as well as chest heaviness.  She reports dizziness associated with positional changes.  She would endorse poor p.o. intake for the last day or so.  She is also describing a intermittently productive cough.  Patient is on the last 2 days of a course of amoxicillin  as well as prednisone  taper.  She has been using her DuoNeb as prescribed, but has not used her DuoNeb MDI or the lyses over the last 2 to 3 days.  No reports of any hemoptysis, or shortness of breath.  No syncope noted.  Physical Exam   Triage Vital Signs: ED Triage Vitals  Encounter Vitals Group     BP 04/20/24 1053 118/85     Girls Systolic BP Percentile --      Girls Diastolic BP Percentile --      Boys Systolic BP Percentile --      Boys Diastolic BP Percentile --      Pulse Rate 04/20/24 1053 96     Resp 04/20/24 1053 18     Temp 04/20/24 1053 98.6 F (37 C)     Temp Source 04/20/24 1053 Oral     SpO2 04/20/24 1053 95 %     Weight 04/20/24 1051 257 lb (116.6 kg)     Height --      Head Circumference --      Peak Flow --      Pain Score 04/20/24 1051 0     Pain Loc --      Pain Education --      Exclude from Growth Chart --     Most recent vital signs: Vitals:   04/20/24 1915 04/20/24 1930  BP:  (!) 111/95  Pulse: 93 88  Resp: 11 (!) 24  Temp:    SpO2: 97% 96%    General Awake, no distress. NAD HEENT NCAT. PERRL. EOMI. No rhinorrhea. Mucous membranes are moist.  CV:  Good peripheral perfusion. RRR RESP:  Normal effort. CTA ABD:  No distention.    ED Results / Procedures / Treatments   Labs (all labs ordered are listed, but only abnormal results are  displayed) Labs Reviewed  BASIC METABOLIC PANEL WITH GFR - Abnormal; Notable for the following components:      Result Value   BUN 28 (*)    Creatinine, Ser 1.05 (*)    GFR, Estimated 56 (*)    All other components within normal limits  CBC - Abnormal; Notable for the following components:   WBC 15.6 (*)    Hemoglobin 15.2 (*)    HCT 46.5 (*)    Platelets 405 (*)    All other components within normal limits  TROPONIN I (HIGH SENSITIVITY)     EKG  Vent. rate 86 BPM PR interval 156 ms QRS duration 98 ms QT/QTcB 364/435 ms P-R-T axes 55 -67 59 Normal sinus rhythm Incomplete right bundle branch block Left anterior fascicular block Moderate voltage criteria for LVH, may be normal variant ( R in aVL , Cornell product ) Possible Lateral infarct (cited on or before 20-Apr-2024) Abnormal ECG  RADIOLOGY  I personally viewed and  evaluated these images as part of my medical decision making, as well as reviewing the written report by the radiologist.  ED Provider Interpretation: No acute findings  DG Chest 2 View Result Date: 04/20/2024 CLINICAL DATA:  chest pain EXAM: CHEST - 2 VIEW COMPARISON:  April 14, 2024 FINDINGS: Lower lung volumes. No focal airspace consolidation, pleural effusion, or pneumothorax. No cardiomegaly.Large hiatal hernia.No acute fracture or destructive lesion. Multilevel thoracic osteophytosis. IMPRESSION: No acute cardiopulmonary abnormality. Electronically Signed   By: Rogelia Myers M.D.   On: 04/20/2024 12:00    PROCEDURES:  Critical Care performed: No  Procedures   MEDICATIONS ORDERED IN ED: Medications  sodium chloride  0.9 % bolus 1,000 mL (1,000 mLs Intravenous New Bag/Given 04/20/24 2010)  ipratropium-albuterol  (DUONEB) 0.5-2.5 (3) MG/3ML nebulizer solution 3 mL (3 mLs Nebulization Given 04/20/24 2003)  benzonatate  (TESSALON ) capsule 200 mg (200 mg Oral Given 04/20/24 2003)     IMPRESSION / MDM / ASSESSMENT AND PLAN / ED COURSE  I reviewed the  triage vital signs and the nursing notes.                              Differential diagnosis includes, but is not limited to, viral syndrome, bronchitis including COPD exacerbation, pneumonia, reactive airway disease including asthma, CHF including exacerbation with or without pulmonary/interstitial edema, pneumothorax, ACS, thoracic trauma, and pulmonary embolism.  Patient's presentation is most consistent with acute complicated illness / injury requiring diagnostic workup.  Patient's diagnosis is consistent with COPD exacerbation following recent pneumonia diagnosis, and dehydration.  Patient presents with no respiratory distress, reporting some intermittent cough as well as some positional dizziness without syncope.  Exam is overall reassuring.  No reports of any ongoing fevers or chills.  Patient's labs are reassuring with no significant lab abnormalities, except for mild elevation to her BUN and creatinine, likely indicating some dehydration.  She also has elevated WBCs, likely related to her concurrent steroid use.  No x-ray evidence of any ongoing consolidation of the lungs based on my interpretation.  Patient treated with an IV fluid bolus as well as a nebulizer treatment in the ED with significant improvement of her symptoms, subjectively.  Patient will be discharged home with prescriptions for Tessalon  Perles and instructions to increase her fluid intake and use her nebulizer as prescribed. Patient is to follow up with PCP as suggested, as needed or otherwise directed. Patient is given ED precautions to return to the ED for any worsening or new symptoms.   FINAL CLINICAL IMPRESSION(S) / ED DIAGNOSES   Final diagnoses:  Dehydration  COPD exacerbation (HCC)     Rx / DC Orders   ED Discharge Orders          Ordered    benzonatate  (TESSALON  PERLES) 100 MG capsule  3 times daily PRN        04/20/24 1928             Note:  This document was prepared using Dragon voice  recognition software and may include unintentional dictation errors.    Loyd Candida LULLA Aldona, PA-C 04/20/24 2054    Suzanne Kirsch, MD 04/23/24 [phone]

## 2024-04-20 NOTE — Discharge Instructions (Addendum)
 Your exam is over are all reassuring.  Your labs do show some mild dehydration.  Your white count is probably elevated due to your current steroid use.  No evidence on your chest x-ray of any ongoing pneumonia.  Continue with the meds as prescribed.  Continue to rest and hydrate as necessary.  Follow-up with your primary provider for ongoing evaluation.  Return to the ED if needed.

## 2024-04-20 NOTE — Group Note (Deleted)
 Date:  04/20/2024 Time:  2:22 PM  Group Topic/Focus:  Wellness Toolbox:   The focus of this group is to discuss various aspects of wellness, balancing those aspects and exploring ways to increase the ability to experience wellness.  Patients will create a wellness toolbox for use upon discharge.     Participation Level:  {BHH PARTICIPATION OZCZO:77735}  Participation Quality:  {BHH PARTICIPATION QUALITY:22265}  Affect:  {BHH AFFECT:22266}  Cognitive:  {BHH COGNITIVE:22267}  Insight: {BHH Insight2:20797}  Engagement in Group:  {BHH ENGAGEMENT IN HMNLE:77731}  Modes of Intervention:  {BHH MODES OF INTERVENTION:22269}  Additional Comments:  ***  Faith Padilla 04/20/2024, 2:22 PM

## 2024-04-20 NOTE — ED Triage Notes (Signed)
 Diagnosed with pneumonia 14 days ago. SEen by pulmonologist 5 days ago.  Arrives today c/o dizziness, chest heaviness when standing.  Amoxicillin  taken with 2 days left of RX and prednisone .  AAOx3.  Skin warm and dry. NAD

## 2024-04-22 ENCOUNTER — Telehealth: Payer: Self-pay | Admitting: Oncology

## 2024-04-22 NOTE — Telephone Encounter (Signed)
 Sometime in the next 1-2 weeks. Labs tbd after I see her

## 2024-04-22 NOTE — Telephone Encounter (Signed)
 Pt called and wants to schedule an appt with Dr.Rao.  Was last seen by MD on 10/21/2023. The los states no f/u needed   I told pt I would get a message to the provider and they would advise on scheduling and pt would be called with an update.

## 2024-04-25 ENCOUNTER — Ambulatory Visit (INDEPENDENT_AMBULATORY_CARE_PROVIDER_SITE_OTHER): Admitting: Urology

## 2024-04-25 VITALS — BP 72/33 | HR 52 | Ht 62.0 in | Wt 263.4 lb

## 2024-04-25 DIAGNOSIS — B379 Candidiasis, unspecified: Secondary | ICD-10-CM

## 2024-04-25 DIAGNOSIS — N3941 Urge incontinence: Secondary | ICD-10-CM

## 2024-04-25 LAB — MICROSCOPIC EXAMINATION

## 2024-04-25 LAB — URINALYSIS, COMPLETE
Bilirubin, UA: NEGATIVE
Glucose, UA: NEGATIVE
Ketones, UA: NEGATIVE
Leukocytes,UA: NEGATIVE
Nitrite, UA: NEGATIVE
Protein,UA: NEGATIVE
RBC, UA: NEGATIVE
Specific Gravity, UA: 1.03 (ref 1.005–1.030)
Urobilinogen, Ur: 0.2 mg/dL (ref 0.2–1.0)
pH, UA: 6 (ref 5.0–7.5)

## 2024-04-25 MED ORDER — OXYBUTYNIN CHLORIDE ER 10 MG PO TB24: 10.0000 mg | ORAL_TABLET | Freq: Every day | ORAL | 11 refills | Status: AC

## 2024-04-25 MED ORDER — FLUCONAZOLE 100 MG PO TABS: 100.0000 mg | ORAL_TABLET | Freq: Every day | ORAL | 0 refills | Status: DC

## 2024-04-25 NOTE — Progress Notes (Signed)
 04/25/2024 1:45 PM   Faith Padilla 11-21-1950 [id number]  Referring provider: Donzella Lauraine SAILOR, DO [address] [place],  [place] [address]  Chief Complaint  Patient presents with   Follow-up    HPI: patient last saw nurse practitioner 7 years ago was on Vesicare .  Had some prolapse symptoms.  Estrogen cream provided.   Today Patient has urge incontinence but does not leak with coughing sneezing.  No bedwetting.  3 heavier briefs a day that are soaked.  Voids every 1 hour and cannot hold it for 2 hours.  Voids hourly at night.  No ankle edema.  No diuretic   Has had 2 low back operations and hysterectomy   Recently went on Vesicare  and had dry mouth and retention symptoms   On further questioning patient was on Myrbetriq for years and she said it did not work.  In the last 6 months she says she has some suprapubic discomfort when she is full and during urination.  It is relieved when she voids     Patient has an overactive bladder with urge incontinence.  She has frequency and significant nocturia.  Reassess in 6 weeks on Gemtesa  samples and prescription for pelvic examination and cystoscopy.  Call if culture positive.  She understands that a bladder infection would explain the discomfort but otherwise I will be performing cystoscopy for incontinence and discomfort with urination   Last culture positive. The patient said when I treated the bladder infection her frequency got a lot at her but she was still leaking some.  She said the Gemtesa  did not help her.  She says sometimes urine can be cloudy or foul-smelling but does not give a strong history of recurrent UTIs   On pelvic examination she had a well supported bladder neck and no prolapse or stress incontinence with a moderate cough after cystoscopy Cystoscopy: Patient underwent flexible cystoscopy.  Urine was cloudy with a lot of white flecks.  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  No carcinoma.     I would like to send in ciprofloxacin  250 mg twice a day for 7 days and have her come back in 6 to 8 weeks on daily trimethoprim . I am hoping this will down regulate some of her incontinence. It may explain why some of the medications have not helped her. Another trial of an antimuscarinic may be prudent. Third line therapies are options. She has not had urodynamics    Today Frequency stable last culture demonstrated gram-positive organisms. Patient says she is infection free.  She says she is 80% better in terms of her urge incontinence.  Reassess durability in 4 months. Call if culture positive. She has dramatically down regulated. She understands that medication in the past may not of help because of her infections.    Today Urgency incontinence dramatically better.  Still wears 2 pads a day.  Clinically infection free.  Frequency stable.  Patient was sent home on Gemtesa  samples to try again in combination with trimethoprim   Today In the last several weeks urge incontinence much better.  Soaking through 4 pads a day.  No cystitis symptoms.  She did not renew the Gemtesa .  Still on daily trimethoprim .  Frequency stable. 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   Diverticulitis    Dry eye syndrome of both  eyes 08/13/2021   Dyspnea on exertion 08/13/2021   Family history of problems related to stress 11/15/2021   Frequency of urination and polyuria 06/25/2022   GAD (generalized anxiety disorder) 08/15/2021   Glaucoma    H/O urinary incontinence 06/25/2022   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   High risk medication use 08/02/2018   HLD (hyperlipidemia)    Iron deficiency 12/04/2019   Isolation (social) 12/07/2021   Kidney stones    Microscopic hematuria    Mixed incontinence    Morbid obesity (HCC)    Ocular hypertension, 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   Ocular migraine 08/02/2018   Osteoarthritis    lumbar spine   Osteoarthritis of right knee 08/28/2023   Palpitations 08/13/2021   Plantar fasciitis 06/25/2023   Post covid-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, 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   Postoperative follow-up 03/30/2020   Prediabetes 08/14/2015   Pseudophakia, both eyes 06/26/2016   Pulmonary fibrosis (HCC) 2020   Rectal bleeding 10/17/2021   Shortness of breath dyspnea    Sleep apnea    CPAP   Vaginal yeast infection    Wears dentures    partial upper    Surgical History: Past Surgical History:  Procedure Laterality Date   ABDOMINAL HYSTERECTOMY  09/08/1996   BACK SURGERY  1990/2014   CATARACT EXTRACTION, BILATERAL  09/08/2012   COLONOSCOPY N/A 12/17/2023   Procedure: COLONOSCOPY;  Surgeon: Jinny Carmine, MD;  Location: [facility] ENDOSCOPY;  Service: Endoscopy;  Laterality: N/A;   COLONOSCOPY WITH PROPOFOL  N/A 03/25/2016   Procedure: COLONOSCOPY WITH PROPOFOL ;  Surgeon: Carmine Jinny, MD;  Location: [facility] ENDOSCOPY;  Service: Endoscopy;  Laterality: N/A;   deviated nose septum surgery  09/08/1998   ESOPHAGOGASTRODUODENOSCOPY (EGD) WITH PROPOFOL  N/A 03/25/2016   Procedure: ESOPHAGOGASTRODUODENOSCOPY (EGD) WITH PROPOFOL ;  Surgeon: Carmine Jinny, MD;  Location: [facility] ENDOSCOPY;  Service: Endoscopy;  Laterality: N/A;   EYE SURGERY     JOINT REPLACEMENT     LITHOTRIPSY  09/08/2010   POLYPECTOMY  12/17/2023    Procedure: POLYPECTOMY, INTESTINE;  Surgeon: Jinny Carmine, MD;  Location: [facility] ENDOSCOPY;  Service: Endoscopy;;   TOTAL HIP ARTHROPLASTY Left     Home Medications:  Allergies as of 04/25/2024       Reactions   Morphine Hives        Medication List        Accurate as of April 25, 2024  1:45 PM. If you have any questions, ask your nurse or doctor.          amoxicillin -clavulanate 875-125 MG tablet Commonly known as: AUGMENTIN  Take 1 tablet by mouth 2 (two) times daily.   atorvastatin  20 MG tablet Commonly known as: LIPITOR TAKE 1 TABLET BY MOUTH EVERY DAY   benzonatate  100 MG capsule Commonly known as: Tessalon  Perles Take 1 capsule (100 mg total) by mouth 3 (three) times daily as needed for cough.   Biotin 1 MG Caps Take 1 capsule by mouth daily.   budesonide -formoterol  160-4.5 MCG/ACT inhaler  Commonly known as: SYMBICORT  Inhale 2 puffs into the lungs 2 (two) times daily.   CALCIUM  PO Take by mouth.   cetirizine  10 MG tablet Commonly known as: ZYRTEC  TAKE 1 TABLET BY MOUTH EVERY DAY   clotrimazole -betamethasone  cream Commonly known as: LOTRISONE  Apply 1 Application topically 2 (two) times daily.   DULoxetine  60 MG capsule Commonly known as: CYMBALTA  Take 1 capsule (60 mg total) by mouth daily. Reported on 12/26/2015   DULoxetine  30 MG capsule Commonly known as: Cymbalta  Take 1 capsule (30 mg total) by mouth daily. Total of 90 mg daily. Take along with 60 mg cap   Gemtesa  75 MG Tabs Generic drug: Vibegron  Take 1 tablet (75 mg total) by mouth daily.   ipratropium 0.03 % nasal spray Commonly known as: ATROVENT  PLACE 2 SPRAYS INTO BOTH NOSTRILS EVERY 12 HOURS USE IN PLACE OF FLONASE    ipratropium-albuterol  0.5-2.5 (3) MG/3ML Soln Commonly known as: DUONEB Take 3 mLs by nebulization every 4 (four) hours as needed.   levothyroxine  75 MCG tablet Commonly known as: SYNTHROID  Take 1 tablet (75 mcg total) by mouth daily before breakfast.   meloxicam  15  MG tablet Commonly known as: MOBIC  Take 1 tablet by mouth daily.   montelukast  10 MG tablet Commonly known as: SINGULAIR  TAKE 1 TABLET(10 MG) BY MOUTH AT BEDTIME   nystatin  powder Commonly known as: MYCOSTATIN /NYSTOP  Apply 1 Application topically 3 (three) times daily.   omeprazole  20 MG capsule Commonly known as: PRILOSEC TAKE 1 CAPSULE(20 MG) BY MOUTH TWICE DAILY BEFORE A MEAL   predniSONE  10 MG tablet Commonly known as: DELTASONE  4 tabs for 3 days, then 3 tabs for 3 days, 2 tabs for 3 days, then 1 tab for 3 days, then stop   tirzepatide  2.5 MG/0.5ML injection vial Commonly known as: ZEPBOUND  Inject 2.5 mg into the skin once a week.   trimethoprim  100 MG tablet Commonly known as: TRIMPEX  Take 1 tablet (100 mg total) by mouth daily.   venlafaxine  XR 37.5 MG 24 hr capsule Commonly known as: EFFEXOR -XR Take 1 capsule (37.5 mg total) by mouth daily with breakfast for 7 days.   venlafaxine  XR 75 MG 24 hr capsule Commonly known as: EFFEXOR -XR Take 1 capsule (75 mg total) by mouth daily with breakfast. Start after completing 37.5 mg daily for one week        Allergies:  Allergies  Allergen Reactions   Morphine Hives    Family History: Family History  Problem Relation Age of Onset   Depression Mother    Asthma Mother    Heart disease Mother    Diabetes Mother    Cancer Father        lung cancer   COPD Father    Liver cancer Father    Cancer Sister        Brain Cancer   Allergic rhinitis Sister    Asthma Sister    Depression Daughter    Hematuria Daughter    Bladder Cancer Neg Hx     Social History:  reports that she has never smoked. She has never been exposed to tobacco smoke. She has never used smokeless tobacco. She reports that she does not drink alcohol and does not use drugs.  ROS:                                        Physical Exam: BP (!) 72/33  Pulse (!) 52   Ht 5' 2 (1.575 m)   Wt 119.5 kg   BMI 48.18 kg/m    Constitutional:  Alert and oriented, No acute distress. HEENT: [place] AT, moist mucus membranes.  Trachea midline, no masses.   Laboratory Data: Lab Results  Component Value Date   WBC 15.6 (H) 04/20/2024   HGB 15.2 (H) 04/20/2024   HCT 46.5 (H) 04/20/2024   MCV 93.8 04/20/2024   PLT 405 (H) 04/20/2024    Lab Results  Component Value Date   CREATININE 1.05 (H) 04/20/2024    No results found for: PSA  No results found for: TESTOSTERONE  Lab Results  Component Value Date   HGBA1C 5.9 (H) 05/07/2023    Urinalysis    Component Value Date/Time   COLORURINE YELLOW (A) 03/04/2016 1920   APPEARANCEUR Clear 02/08/2024 1321   LABSPEC 1.014 03/04/2016 1920   LABSPEC 1.014 07/24/2014 1519   PHURINE 7.0 03/04/2016 1920   GLUCOSEU Negative 02/08/2024 1321   GLUCOSEU Negative 07/24/2014 1519   HGBUR NEGATIVE 03/04/2016 1920   BILIRUBINUR Negative 02/08/2024 1321   BILIRUBINUR Negative 07/24/2014 1519   KETONESUR NEGATIVE 03/04/2016 1920   PROTEINUR Negative 02/08/2024 1321   PROTEINUR NEGATIVE 03/04/2016 1920   UROBILINOGEN negative 12/06/2015 1448   NITRITE Negative 02/08/2024 1321   NITRITE NEGATIVE 03/04/2016 1920   LEUKOCYTESUR 1+ (A) 02/08/2024 1321   LEUKOCYTESUR 2+ 07/24/2014 1519    Pertinent Imaging: Urine reviewed and sent for culture.  Assessment & Plan: The patient has high-volume urge incontinence.  Role of urodynamics discussed.  Call if culture positive.  Patient will stay on the trimethoprim  and I will call if the culture is positive.  She wants to try oxybutynin  ER 10 mg 3 x 11 and she wants to do urodynamics.  If she does great she can cancel the test.  We will proceed accordingly.  I called in Diflucan  100 mg daily for 3 days  1. Urgency incontinence (Primary)  - Urinalysis, Complete   No follow-ups on file.  Glendia DELENA Elizabeth, MD  [facility] [address], [address] [place], [place] [address] [phone]

## 2024-04-27 ENCOUNTER — Other Ambulatory Visit
Admission: RE | Admit: 2024-04-27 | Discharge: 2024-04-27 | Disposition: A | Discharge status: Home / Self Care | Source: Ambulatory Visit | Attending: Nurse Practitioner | Admitting: Nurse Practitioner

## 2024-04-27 ENCOUNTER — Ambulatory Visit
Admission: RE | Admit: 2024-04-27 | Discharge: 2024-04-27 | Disposition: A | Discharge status: Home / Self Care | Source: Ambulatory Visit | Attending: Nurse Practitioner | Admitting: Nurse Practitioner

## 2024-04-27 ENCOUNTER — Encounter: Payer: Self-pay | Admitting: Nurse Practitioner

## 2024-04-27 ENCOUNTER — Ambulatory Visit: Payer: Self-pay | Admitting: Nurse Practitioner

## 2024-04-27 ENCOUNTER — Telehealth: Payer: Self-pay | Admitting: Pulmonary Disease

## 2024-04-27 ENCOUNTER — Ambulatory Visit (INDEPENDENT_AMBULATORY_CARE_PROVIDER_SITE_OTHER): Admitting: Nurse Practitioner

## 2024-04-27 VITALS — BP 100/59 | HR 119 | Temp 97.3°F | Ht 62.0 in | Wt 262.8 lb

## 2024-04-27 DIAGNOSIS — D72829 Elevated white blood cell count, unspecified: Secondary | ICD-10-CM | POA: Diagnosis present

## 2024-04-27 DIAGNOSIS — E860 Dehydration: Secondary | ICD-10-CM | POA: Diagnosis present

## 2024-04-27 DIAGNOSIS — R0602 Shortness of breath: Secondary | ICD-10-CM | POA: Insufficient documentation

## 2024-04-27 LAB — CBC WITH DIFFERENTIAL/PLATELET
Abs Immature Granulocytes: 0.07 K/uL (ref 0.00–0.07)
Basophils Absolute: 0.1 K/uL (ref 0.0–0.1)
Basophils Relative: 1 %
Eosinophils Absolute: 0.2 K/uL (ref 0.0–0.5)
Eosinophils Relative: 1 %
HCT: 44.4 % (ref 36.0–46.0)
Hemoglobin: 14.2 g/dL (ref 12.0–15.0)
Immature Granulocytes: 1 %
Lymphocytes Relative: 27 %
Lymphs Abs: 3.3 K/uL (ref 0.7–4.0)
MCH: 30.6 pg (ref 26.0–34.0)
MCHC: 32 g/dL (ref 30.0–36.0)
MCV: 95.7 fL (ref 80.0–100.0)
Monocytes Absolute: 1.4 K/uL — ABNORMAL HIGH (ref 0.1–1.0)
Monocytes Relative: 11 %
Neutro Abs: 7.4 K/uL (ref 1.7–7.7)
Neutrophils Relative %: 59 %
Platelets: 370 K/uL (ref 150–400)
RBC: 4.64 MIL/uL (ref 3.87–5.11)
RDW: 13.7 % (ref 11.5–15.5)
WBC: 12.5 K/uL — ABNORMAL HIGH (ref 4.0–10.5)
nRBC: 0 % (ref 0.0–0.2)

## 2024-04-27 LAB — BASIC METABOLIC PANEL WITH GFR
Anion gap: 8 (ref 5–15)
BUN: 34 mg/dL — ABNORMAL HIGH (ref 8–23)
CO2: 24 mmol/L (ref 22–32)
Calcium: 10 mg/dL (ref 8.9–10.3)
Chloride: 105 mmol/L (ref 98–111)
Creatinine, Ser: 1.04 mg/dL — ABNORMAL HIGH (ref 0.44–1.00)
GFR, Estimated: 57 mL/min — ABNORMAL LOW (ref >60)
Glucose, Bld: 97 mg/dL (ref 70–99)
Potassium: 4.9 mmol/L (ref 3.5–5.1)
Sodium: 137 mmol/L (ref 135–145)

## 2024-04-27 LAB — BRAIN NATRIURETIC PEPTIDE: B Natriuretic Peptide: 14.3 pg/mL (ref 0.0–100.0)

## 2024-04-27 LAB — NITRIC OXIDE: Nitric Oxide: 5

## 2024-04-27 MED ORDER — IOHEXOL 350 MG/ML SOLN
75.0000 mL | Freq: Once | INTRAVENOUS | Status: AC | PRN
  Administered 2024-04-27: 75 mL via INTRAVENOUS

## 2024-04-27 MED ORDER — IOHEXOL 350 MG/ML SOLN: 75.0000 mL | Freq: Once | INTRAVENOUS | Status: DC | PRN

## 2024-04-27 NOTE — Patient Instructions (Signed)
 Continue Albuterol  inhaler 2 puffs or duoneb 3 mL neb every 6 hours as needed for shortness of breath or wheezing. Notify if symptoms persist despite rescue inhaler/neb use. Use nebs 2-3 times a day until symptoms improve Restart Symbicort  2 puffs Twice daily. Brush tongue and rinse mouth afterwards Continue zyrtec  1 tab daily Continue atrovent  nasal spray Twice daily for nasal congestion/drainage Continue montelukast  1 tab daily    CT chest to rule out blood clot or underlying condition Labs today   If you get worse, go to the hospital    Follow up in 2 weeks with Dr. Kassie or Faith Vearl Allbaugh,NP. If symptoms do not improve or worsen, please contact office for sooner follow up or seek emergency care.

## 2024-04-27 NOTE — Progress Notes (Unsigned)
 @Patient  ID: Faith Padilla, female    DOB: [birth year], 73 y.o.   MRN: [id number]  Chief Complaint  Patient presents with   Pneumonia    Felling better. Still has a cough but it is better. Occasional wheezing. SOB.     Referring provider: Donzella Lauraine SAILOR, DO  HPI: 73 year old female, never smoker followed for postinflammatory pulmonary fibrosis and chronic respiratory failure, chronic cough.  She is a patient Dr. Laymond and last seen in office 04/14/2024 by [facility] NP.  Past medical history significant for OSA on CPAP, hiatal hernia, GERD, hypothyroid, CKD, prediabetes, fibromyalgia, depression.  TEST/EVENTS:  08/18/2023 CT chest: Atherosclerosis.  Enlarged PA.  Upper and mid lung zone predominant interstitial coarsening and groundglass with traction bronchiectasis/bronchiolectasis, similar to 2022.  Minimal scattered subpleural reticular densities.  No honeycombing.  Not consistent with UIP.  Large hiatal hernia. 11/03/2023 PFT: FVC 85, FEV1 98, ratio 86, TLC 96, DLCO 107.  No BD.  Normal lung function 04/09/2024 CXR: Streaky opacity at right lung base.  Mild prominent reticular opacities at left lung base.  Large hiatal hernia 04/20/2024 CXR: lower lung volumes. No acute process  11/03/2023: OV with Dr. Kassie.  Participating in pulmonary rehab.  Received her oxygen in December.  Not needing it while exercising.  Completed PFTs.  Had bronchitis 2 weeks ago and was treated with steroids and Symbicort .  Stop using the Symbicort .  Has been coughing with deep breaths and exercises.  Prior CT with stable fibrosis and PFTs demonstrating normal results which is suggestive of improving postviral/COVID inflammation.  Cough likely multifactorial.  Encouraged to restart bronchodilators to see if this improves symptoms.  04/14/2024: SHERLEAN with Anelisse Jacobson NP Discussed the use of AI scribe software for clinical note transcription with the patient, who gave verbal consent to proceed Faith Padilla is a 73 year old female  with post COVID fibrosis and chronic respiratory failure who presents with worsening respiratory symptoms. She visited the emergency department five days ago and was prescribed doxycycline  and prednisone . Initially, she felt a slight improvement yesterday but feels worse today, describing a decline in her condition. She experiences a productive cough with yellow sputum but no hemoptysis. She has noticed occasional low oxygen levels at home, dropping to 85%. She does not use oxygen regularly at baseline but does have it at home.  She completed her course of prednisone  this morning and uses a Symbicort  inhaler twice daily, sometimes more frequently. She also performs breathing treatments twice a day, although they cause significant jitteriness. Last one this morning.  No fever and she maintains her eating and drinking habits. She has been monitoring her oxygen levels since her emergency department visit. Her symptoms began a week ago on Wednesday, prior to her emergency department visit. She reports sinus drainage with yellow discharge, which is improving, but her lung symptoms are worsening. No leg swelling, calf pain/tenderness, CP.   04/27/2024: Today - follow up Faith Padilla is a 73 year old female who presents with persistent cough and shortness of breath.  She experiences persistent cough and shortness of breath, particularly when getting up. Despite treatment with antibiotics and steroids, her symptoms have worsened. No hemoptysis, fevers present. No calf pain or swelling.   She feels exhausted, tired, and weak. She experiences constant lightheadedness and dizziness. Her heart rate is elevated, reaching almost 120 beats per minute, which is higher than her previous readings.  She experiences occasional chest pain located in the middle of her chest, which occurs without any specific  activity. Not active right now. No palpitations or syncope. No leg swelling, orthopnea, PND. She's not noticed low  oxygen levels at home in last few days.   She notes a sensation of sloshing in her ear. No ear pain or drainage is reported.  She's not using the Symbicort  right now. Using albuterol  some without much change. No wheezing, chest congestion.   Allergies  Allergen Reactions   Morphine Hives    Immunization History  Administered Date(s) Administered   Fluad Trivalent(High Dose 65+) 07/20/2023   Fluzone Influenza virus vaccine,trivalent (IIV3), split virus 10/03/2012   Influenza, Mdck, Trivalent,PF 6+ MOS(egg free) 09/07/2013   Influenza, Seasonal, Injecte, Preservative Fre 08/22/2014, 05/14/2021   Influenza,inj,Quad PF,6+ Mos 10/03/2012, 08/14/2015   Influenza-Unspecified 10/03/2012, 09/07/2013, 08/22/2014, 08/14/2015   Moderna Sars-Covid-2 Vaccination 02/17/2020, 03/16/2020   Pneumococcal Polysaccharide-23 09/09/2011, 10/03/2012, 05/14/2021   Tdap 09/08/2004, 08/21/2011, 02/22/2021    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   Diverticulitis    Dry eye syndrome of both eyes 08/13/2021   Dyspnea on exertion 08/13/2021   Family history of problems related to stress 11/15/2021   Frequency of urination and polyuria 06/25/2022   GAD (generalized anxiety disorder) 08/15/2021   Glaucoma    H/O urinary incontinence 06/25/2022   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   High risk medication use 08/02/2018    HLD (hyperlipidemia)    Iron deficiency 12/04/2019   Isolation (social) 12/07/2021   Kidney stones    Microscopic hematuria    Mixed incontinence    Morbid obesity (HCC)    Ocular hypertension, 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   Ocular migraine 08/02/2018   Osteoarthritis    lumbar spine   Osteoarthritis of right knee 08/28/2023   Palpitations 08/13/2021   Plantar fasciitis 06/25/2023   Post covid-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, 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   Postoperative follow-up 03/30/2020   Prediabetes 08/14/2015   Pseudophakia, both eyes 06/26/2016   Pulmonary fibrosis (HCC) 2020   Rectal bleeding 10/17/2021   Shortness of breath dyspnea    Sleep apnea    CPAP   Vaginal yeast infection    Wears dentures    partial upper    Tobacco History: Social History   Tobacco Use  Smoking Status Never   Passive exposure: Never (Parent)  Smokeless Tobacco Never   Counseling given: Not Answered   Outpatient Medications Prior to Visit  Medication Sig Dispense Refill   atorvastatin  (LIPITOR) 20 MG tablet TAKE 1 TABLET BY MOUTH EVERY DAY 90 tablet 0   benzonatate  (TESSALON  PERLES) 100 MG capsule Take 1 capsule (100 mg total) by mouth 3 (three) times daily as needed for cough. 30 capsule 0   Biotin 1 MG CAPS Take 1 capsule by mouth daily.     budesonide -formoterol  (SYMBICORT ) 160-4.5 MCG/ACT inhaler Inhale 2 puffs into the lungs 2 (two) times daily. 1 each 3   CALCIUM  PO Take by mouth.     cetirizine  (  ZYRTEC ) 10 MG tablet TAKE 1 TABLET BY MOUTH EVERY DAY 90 tablet 0   fluconazole  (DIFLUCAN ) 100 MG tablet Take 1 tablet (100 mg total) by mouth daily. 3 tablet 0   ipratropium (ATROVENT ) 0.03 % nasal spray PLACE 2 SPRAYS INTO BOTH NOSTRILS EVERY 12 HOURS USE IN PLACE OF FLONASE  30 mL 5   ipratropium-albuterol  (DUONEB) 0.5-2.5 (3) MG/3ML SOLN Take 3 mLs by nebulization every 4 (four) hours as needed. 360 mL 3   levothyroxine  (SYNTHROID ) 75 MCG tablet Take 1 tablet (75 mcg total) by mouth daily before breakfast. 90  tablet 3   meloxicam  (MOBIC ) 15 MG tablet Take 1 tablet by mouth daily.     montelukast  (SINGULAIR ) 10 MG tablet TAKE 1 TABLET(10 MG) BY MOUTH AT BEDTIME 90 tablet 2   omeprazole  (PRILOSEC) 20 MG capsule TAKE 1 CAPSULE(20 MG) BY MOUTH TWICE DAILY BEFORE A MEAL 180 capsule 0   oxybutynin  (DITROPAN -XL) 10 MG 24 hr tablet Take 1 tablet (10 mg total) by mouth daily. 30 tablet 11   trimethoprim  (TRIMPEX ) 100 MG tablet Take 1 tablet (100 mg total) by mouth daily. 30 tablet 11   venlafaxine  XR (EFFEXOR -XR) 75 MG 24 hr capsule Take 1 capsule (75 mg total) by mouth daily with breakfast. Start after completing 37.5 mg daily for one week 30 capsule 1   amoxicillin -clavulanate (AUGMENTIN ) 875-125 MG tablet Take 1 tablet by mouth 2 (two) times daily. (Patient not taking: Reported on 04/27/2024) 14 tablet 0   clotrimazole -betamethasone  (LOTRISONE ) cream Apply 1 Application topically 2 (two) times daily. (Patient not taking: Reported on 04/27/2024) 30 g 0   DULoxetine  (CYMBALTA ) 30 MG capsule Take 1 capsule (30 mg total) by mouth daily. Total of 90 mg daily. Take along with 60 mg cap (Patient not taking: Reported on 04/27/2024) 30 capsule 1   DULoxetine  (CYMBALTA ) 60 MG capsule Take 1 capsule (60 mg total) by mouth daily. Reported on 12/26/2015 (Patient not taking: Reported on 04/27/2024) 90 capsule 3   nystatin  (MYCOSTATIN /NYSTOP ) powder Apply 1 Application topically 3 (three) times daily. (Patient not taking: Reported on 04/27/2024) 60 g 3   predniSONE  (DELTASONE ) 10 MG tablet 4 tabs for 3 days, then 3 tabs for 3 days, 2 tabs for 3 days, then 1 tab for 3 days, then stop (Patient not taking: Reported on 04/27/2024) 30 tablet 0   tirzepatide  (ZEPBOUND ) 2.5 MG/0.5ML injection vial Inject 2.5 mg into the skin once a week. (Patient not taking: Reported on 04/25/2024) 2 mL 0   venlafaxine  XR (EFFEXOR -XR) 37.5 MG 24 hr capsule Take 1 capsule (37.5 mg total) by mouth daily with breakfast for 7 days. (Patient not taking: Reported  on 04/27/2024) 7 capsule 0   Facility-Administered Medications Prior to Visit  Medication Dose Route Frequency Provider Last Rate Last Admin   albuterol  (PROVENTIL ) (2.5 MG/3ML) 0.083% nebulizer solution 2.5 mg  2.5 mg Nebulization Q4H Dorotea Hand, Comer GAILS, NP   2.5 mg at 04/14/24 1614     Review of Systems:   Constitutional: No weight loss or gain, night sweats, fevers, chills, +fatigue, lassitude. HEENT: No headaches, difficulty swallowing, tooth/dental problems, or sore throat. No sneezing, itching, ear ache, nasal congestion, post nasal drip CV:  + chest discomfort, dizziness,. No orthopnea, PND, swelling in lower extremities, anasarca, palpitations, syncope Resp: + shortness of breath with exertion; cough. No hemoptysis. No chest wall deformity GI:  No heartburn, indigestion, abdominal pain, nausea, vomiting, diarrhea, change in bowel habits, loss of appetite, bloody stools.  GU: No dysuria,  change in color of urine, urgency or frequency.  No flank pain, no hematuria  Skin: No rash, lesions, ulcerations MSK:  No joint pain or swelling.   Neuro: No memory impairment  Psych: No depression or anxiety. Mood stable.     Physical Exam:  BP (!) 100/59   Pulse (!) 119   Temp (!) 97.3 F (36.3 C)   Ht 5' 2 (1.575 m)   Wt 262 lb 12.8 oz (119.2 kg)   SpO2 94%   BMI 48.07 kg/m   GEN: Pleasant, interactive, acutely-ill appearing; obese; in no acute distress HEENT:  Normocephalic and atraumatic. PERRLA. Sclera white. Right EAC with cerumen impaction. Left EAC patent. TM pearly gray with present light reflex b/l. Nasal turbinates pink, moist and patent bilaterally. No rhinorrhea present. Oropharynx pink and moist, without exudate or edema. No lesions, ulcerations, or postnasal drip.  NECK:  Supple w/ fair ROM. No JVD present. No lymphadenopathy.   CV: Tachycardia, regular rhythm, no m/r/g, no peripheral edema. Pulses intact, +2 bilaterally. No cyanosis, pallor or clubbing. PULMONARY:   Unlabored, regular breathing. Minimal expiratory wheeze b/l LL otherwise clear bilaterally A&P. No accessory muscle use.  GI: BS present and normoactive. Soft, non-tender to palpation. No organomegaly or masses detected. MSK: No erythema, warmth or tenderness. Cap refil <2 sec all extrem.  Neuro: A/Ox3. No focal deficits noted.   Skin: Warm, no lesions or rashe Psych: Normal affect and behavior. Judgement and thought content appropriate.     Lab Results:  CBC    Component Value Date/Time   WBC 15.6 (H) 04/20/2024 1125   RBC 4.96 04/20/2024 1125   HGB 15.2 (H) 04/20/2024 1125   HGB 13.9 04/14/2024 1557   HCT 46.5 (H) 04/20/2024 1125   HCT 43.0 04/14/2024 1557   PLT 405 (H) 04/20/2024 1125   PLT 348 04/14/2024 1557   MCV 93.8 04/20/2024 1125   MCV 95 04/14/2024 1557   MCV 79 (L) 09/01/2014 0607   MCH 30.6 04/20/2024 1125   MCHC 32.7 04/20/2024 1125   RDW 13.3 04/20/2024 1125   RDW 13.0 04/14/2024 1557   RDW 17.4 (H) 09/01/2014 0607   LYMPHSABS 1.1 04/14/2024 1557   LYMPHSABS 1.1 09/01/2014 0607   MONOABS 0.9 09/01/2014 0607   EOSABS 0.0 04/14/2024 1557   EOSABS 0.0 09/01/2014 0607   BASOSABS 0.1 04/14/2024 1557   BASOSABS 0.0 09/01/2014 0607    BMET    Component Value Date/Time   NA 138 04/20/2024 1125   NA 139 07/20/2023 1051   NA 139 08/30/2014 2233   K 4.6 04/20/2024 1125   K 4.2 08/30/2014 2233   CL 101 04/20/2024 1125   CL 107 08/30/2014 2233   CO2 27 04/20/2024 1125   CO2 22 08/30/2014 2233   GLUCOSE 98 04/20/2024 1125   GLUCOSE 87 08/30/2014 2233   BUN 28 (H) 04/20/2024 1125   BUN 26 07/20/2023 1051   BUN 13 08/30/2014 2233   CREATININE 1.05 (H) 04/20/2024 1125   CREATININE 1.26 08/30/2014 2233   CALCIUM  9.5 04/20/2024 1125   CALCIUM  8.6 08/30/2014 2233   GFRNONAA 56 (L) 04/20/2024 1125   GFRNONAA 46 (L) 08/30/2014 2233   GFRNONAA 53 (L) 04/09/2014 1915   GFRAA 87 03/12/2016 1001   GFRAA 55 (L) 08/30/2014 2233   GFRAA >60 04/09/2014 1915     BNP    Component Value Date/Time   BNP 12.4 04/09/2024 1125     Imaging:  DG Chest 2 View Result Date: 04/20/2024  CLINICAL DATA:  chest pain EXAM: CHEST - 2 VIEW COMPARISON:  April 14, 2024 FINDINGS: Lower lung volumes. No focal airspace consolidation, pleural effusion, or pneumothorax. No cardiomegaly.Large hiatal hernia.No acute fracture or destructive lesion. Multilevel thoracic osteophytosis. IMPRESSION: No acute cardiopulmonary abnormality. Electronically Signed   By: Rogelia Myers M.D.   On: 04/20/2024 12:00   DG Chest 2 View Result Date: 04/14/2024 CLINICAL DATA:  worsening DOE/productive cough EXAM: CHEST - 2 VIEW COMPARISON:  None available. FINDINGS: No focal airspace consolidation, pleural effusion, or pneumothorax. Mild cardiomegaly. Large hiatal hernia. No acute fracture or destructive lesion. Multilevel degenerative disc disease of the spine. IMPRESSION: No acute cardiopulmonary abnormality. Electronically Signed   By: Rogelia Myers M.D.   On: 04/14/2024 16:23   DG Chest 2 View Result Date: 04/09/2024 EXAM: 2 VIEW(S) XRAY OF THE CHEST 04/09/2024 11:47:00 AM COMPARISON: PA and lateral radiographs of the chest dated 10/16/2023. CLINICAL HISTORY: Cough with a history of pulmonary fibrosis. Intermittent shortness of breath with walking. Dry skin. Coughing in triage. FINDINGS: LUNGS AND PLEURA: Streaky opacity present within the right lung base. Mildly prominent reticular opacities also present within the left lung base. No pleural effusion. No pneumothorax. HEART AND MEDIASTINUM: No acute abnormality of the cardiac and mediastinal silhouettes. Large hiatus hernia demonstrated. BONES AND SOFT TISSUES: No acute osseous abnormality. IMPRESSION: 1. Streaky opacity in the right lung base and mildly prominent reticular opacities in the left lung base, possibly related to the patient's history of pulmonary fibrosis. 2. Large hiatus hernia. Electronically signed by: evalene coho  04/09/2024 11:56 AM EDT RP Workstation: [id number]    albuterol  (PROVENTIL ) (2.5 MG/3ML) 0.083% nebulizer solution 2.5 mg     Date Action Dose Route User   Discharged on 04/20/2024   Admitted on 04/20/2024   04/14/2024 1614 Given 2.5 mg Nebulization Trudy Warren CROME, CMA      methylPREDNISolone  acetate (DEPO-MEDROL ) injection 120 mg     Date Action Dose Route User   Discharged on 04/20/2024   Admitted on 04/20/2024   04/14/2024 1612 Given 120 mg Intramuscular (Right Quadriceps) Trudy Warren CROME, CMA          Latest Ref Rng & Units 11/03/2023    1:30 PM  PFT Results  FVC-Pre L 2.27   FVC-Predicted Pre % 85   FVC-Post L 2.37   FVC-Predicted Post % 89   Pre FEV1/FVC % % 86   Post FEV1/FCV % % 86   FEV1-Pre L 1.95   FEV1-Predicted Pre % 98   FEV1-Post L 2.05   DLCO uncorrected ml/min/mmHg 20.22   DLCO UNC% % 112   DLCO corrected ml/min/mmHg 19.33   DLCO COR %Predicted % 107   DLVA Predicted % 117   TLC L 4.58   TLC % Predicted % 96   RV % Predicted % 75     No results found for: NITRICOXIDE      Assessment & Plan:   No problem-specific Assessment & Plan notes found for this encounter. Assessment and Plan Assessment & Plan Orthostatic intolerance with tachycardia, dizziness, and lightheadedness Orthostatic intolerance with significant increase in heart rate up to 134 beats per minute with position changes. Orthostatic hypotension not present and no hypoxia. Differential includes dehydration, cardiac etiology, infectious process or pulmonary embolism. - Perform EKG to assess heart rhythm. - Conduct orthostatic vital signs assessment.  DOE Concern for PE with tachycardia, persistent DOE despite prednisone /abx therapy, chest discomfort. RV hypertrophy findings on EKG with some inverted t waves. Inverted t  waves were evident on prior EKG. Previous chest x-ray showed improvement, and lungs sound better, reducing likelihood of worsening pneumonia.  - Order stat CT scan  of the chest to rule out pulmonary embolism. - Evaluate BNP to rule out cardiac etiology. - If initial evaluations are unrevealing, refer to cardiology for further workup. - Strict ED precautions  Fatigue and weakness Reports of exhaustion, tiredness, and weakness. Blood pressure slightly lower than previous readings but not significantly low. Symptoms may be related to underlying cardiac or pulmonary issues.  Central chest pain Intermittent central chest pain without specific triggers. Differential includes cardiac or pulmonary causes.     Advised if symptoms do not improve or worsen, to please contact office for sooner follow up or seek emergency care.   I spent 45 minutes of dedicated to the care of this patient on the date of this encounter to include pre-visit review of records, face-to-face time with the patient discussing conditions above, post visit ordering of testing, clinical documentation with the electronic health record, making appropriate referrals as documented, and communicating necessary findings to members of the patients care team.  Comer LULLA Rouleau, NP 04/27/2024  Pt aware and understands NP's role.

## 2024-04-27 NOTE — Progress Notes (Signed)
 No evidence of PE or pneumonia on chest CT. She has scarring, which is stable. Her labs show a slight increase in her kidney function, but stable from last week. Her WBC is downtrending, likely elevated due to prior prednisone  use. Recommend pushing fluids and increasing salt intake tonight. Call her cardiologist tomorrow to see if she can get a sooner appt. If HR remains >110 tomorrow and no better, or if symptoms worsen, needs to go back to the ED for cardiac workup.

## 2024-04-27 NOTE — Telephone Encounter (Signed)
 Pt unable to commute to [facility]. [facility] in [place] is doing a TOC to [employer]

## 2024-04-28 ENCOUNTER — Encounter: Payer: Self-pay | Admitting: Nurse Practitioner

## 2024-04-28 ENCOUNTER — Other Ambulatory Visit: Payer: Self-pay

## 2024-04-28 ENCOUNTER — Emergency Department

## 2024-04-28 ENCOUNTER — Emergency Department: Admission: EM | Admit: 2024-04-28 | Discharge: 2024-04-28 | Disposition: A | Discharge status: Home / Self Care

## 2024-04-28 ENCOUNTER — Ambulatory Visit: Admitting: Nurse Practitioner

## 2024-04-28 DIAGNOSIS — J449 Chronic obstructive pulmonary disease, unspecified: Secondary | ICD-10-CM | POA: Insufficient documentation

## 2024-04-28 DIAGNOSIS — R0789 Other chest pain: Secondary | ICD-10-CM | POA: Insufficient documentation

## 2024-04-28 DIAGNOSIS — R0602 Shortness of breath: Secondary | ICD-10-CM | POA: Diagnosis not present

## 2024-04-28 DIAGNOSIS — R079 Chest pain, unspecified: Secondary | ICD-10-CM

## 2024-04-28 LAB — BASIC METABOLIC PANEL WITH GFR
Anion gap: 10 (ref 5–15)
BUN: 27 mg/dL — ABNORMAL HIGH (ref 8–23)
CO2: 24 mmol/L (ref 22–32)
Calcium: 9.1 mg/dL (ref 8.9–10.3)
Chloride: 102 mmol/L (ref 98–111)
Creatinine, Ser: 0.99 mg/dL (ref 0.44–1.00)
GFR, Estimated: >60 mL/min (ref >60)
Glucose, Bld: 92 mg/dL (ref 70–99)
Potassium: 4.3 mmol/L (ref 3.5–5.1)
Sodium: 136 mmol/L (ref 135–145)

## 2024-04-28 LAB — TROPONIN I (HIGH SENSITIVITY)
Troponin I (High Sensitivity): 4 ng/L (ref <18)
Troponin I (High Sensitivity): 5 ng/L (ref <18)

## 2024-04-28 LAB — CBC
HCT: 43.1 % (ref 36.0–46.0)
Hemoglobin: 13.7 g/dL (ref 12.0–15.0)
MCH: 31.1 pg (ref 26.0–34.0)
MCHC: 31.8 g/dL (ref 30.0–36.0)
MCV: 97.7 fL (ref 80.0–100.0)
Platelets: 322 K/uL (ref 150–400)
RBC: 4.41 MIL/uL (ref 3.87–5.11)
RDW: 13.8 % (ref 11.5–15.5)
WBC: 7.4 K/uL (ref 4.0–10.5)
nRBC: 0 % (ref 0.0–0.2)

## 2024-04-28 MED ORDER — PANTOPRAZOLE SODIUM 40 MG IV SOLR
40.0000 mg | Freq: Once | INTRAVENOUS | Status: AC
  Administered 2024-04-28: 40 mg via INTRAVENOUS
  Filled 2024-04-28: qty 10

## 2024-04-28 MED ORDER — ALUM & MAG HYDROXIDE-SIMETH 200-200-20 MG/5ML PO SUSP
30.0000 mL | Freq: Once | ORAL | Status: AC
  Administered 2024-04-28: 30 mL via ORAL
  Filled 2024-04-28: qty 30

## 2024-04-28 NOTE — Telephone Encounter (Signed)
 Thank you for the update. Patient scheduled at [facility] office with Dr. Tamea.

## 2024-04-28 NOTE — ED Provider Notes (Signed)
 [facility] Provider Note    Event Date/Time   First MD Initiated Contact with Patient 04/28/24 1037     (approximate)   History   Chest Pain   HPI  Faith Padilla is a 73 y.o. female  with a history of glaucoma, HLD, OSA, COPD, GERD, pulmonary fibrosis (not on O2) who presents to the ED with 24 hours of chest pain and shortness of breath.  Patient states that she has chest discomfort and has been ongoing for the past 24 hours.  She was seen by her pulmonologist yesterday and had a CTA chest for pulmonary embolism which was unremarkable.  Chest pain is midsternal and does not radiate.  There is associated shortness of breath but no nausea or lightheaded.  She has no prior history of CAD and is scheduled to see cardiology tomorrow morning.  No fevers or chills abdominal pain changes in urinary or bowel habits.  Her daughter was at bedside and contributes to the history      Physical Exam   Triage Vital Signs: ED Triage Vitals  Encounter Vitals Group     BP 04/28/24 1001 116/71     Girls Systolic BP Percentile --      Girls Diastolic BP Percentile --      Boys Systolic BP Percentile --      Boys Diastolic BP Percentile --      Pulse Rate 04/28/24 1000 77     Resp 04/28/24 1000 18     Temp 04/28/24 1000 98.1 F (36.7 C)     Temp Source 04/28/24 1000 Oral     SpO2 04/28/24 1000 95 %     Weight --      Height --      Head Circumference --      Peak Flow --      Pain Score 04/28/24 1000 0     Pain Loc --      Pain Education --      Exclude from Growth Chart --     Most recent vital signs: Vitals:   04/28/24 1001 04/28/24 1415  BP: 116/71 118/75  Pulse:  83  Resp:  20  Temp:  98 F (36.7 C)  SpO2:  100%    Nursing Triage Note reviewed. Vital signs reviewed and patients oxygen saturation is normoxic  General: Patient is well nourished, well developed, awake and alert, resting comfortably in no acute distress Head: Normocephalic and  atraumatic Eyes: Normal inspection, extraocular muscles intact, no conjunctival pallor Ear, nose, throat: Normal external exam Neck: Normal range of motion Respiratory: Patient is in no respiratory distress, lungs CTAB Cardiovascular: Patient is not tachycardic, RRR without murmur appreciated GI: Abd SNT with no guarding or rebound  Back: Normal inspection of the back with good strength and range of motion throughout all ext Extremities: pulses intact with good cap refills, no LE pitting edema or calf tenderness Neuro: The patient is alert and oriented to person, place, and time, appropriately conversive, with 5/5 bilat UE/LE strength, no gross motor or sensory defects noted. Coordination appears to be adequate. Skin: Warm, dry, and intact Psych: normal mood and affect, no SI or HI  ED Results / Procedures / Treatments   Labs (all labs ordered are listed, but only abnormal results are displayed) Labs Reviewed  BASIC METABOLIC PANEL WITH GFR - Abnormal; Notable for the following components:      Result Value   BUN 27 (*)    All other  components within normal limits  CBC  TROPONIN I (HIGH SENSITIVITY)  TROPONIN I (HIGH SENSITIVITY)  TROPONIN I (HIGH SENSITIVITY)     EKG EKG and rhythm strip are interpreted by myself:   EKG: [Normal sinus rhythm] at heart rate of 83, normal QRS duration, QTc 418, nonspecific ST segments and T waves no ectopya EKG not consistent with Acute STEMI Rhythm strip: NSR in lead II   RADIOLOGY Xray chest: Hiatal hernia but no acute abnormality on my independent review interpretation radiologist agrees    PROCEDURES:  Critical Care performed: No  Procedures   MEDICATIONS ORDERED IN ED: Medications  alum & mag hydroxide-simeth (MAALOX/MYLANTA) 200-200-20 MG/5ML suspension 30 mL (30 mLs Oral Given 04/28/24 1124)  pantoprazole  (PROTONIX ) injection 40 mg (40 mg Intravenous Given 04/28/24 1117)     IMPRESSION / MDM / ASSESSMENT AND PLAN / ED  COURSE                                Differential diagnosis includes, but is not limited to, ACS, pneumonia, GERD, electrolyte derangement  ED course: Patient is well-appearing and satting well on room air.  I reviewed the CTA chest from yesterday which demonstrated no pulmonary embolism.  Given the large hiatal hernia I did treat the patient with Protonix  IV and a GI cocktail and patient had improvement.  EKG is not consistent with acute ischemia.  Chest x-ray was unremarkable for acute abnormality.  Troponins x 2 were not elevated.  Patient is already scheduled to see cardiology tomorrow morning which I think is appropriate.  She and daughter were reassured by today's workup and feel comfortable returning home at this time   Clinical Course as of 04/28/24 1523  Thu Apr 28, 2024  1153 Troponin I (High Sensitivity): 4 First troponin not elevated [HD]  1413 Troponin I (High Sensitivity): 5 Not elevated x 2 [HD]  1414 Patient reevaluated and I counseled her on her results and she voiced understanding.  She feels improved and feels comfortable following up with cardiology tomorrow.  Return precautions given patient voiced understanding [HD]    Clinical Course User Index [HD] Nicholaus Rolland BRAVO, MD   At time of discharge there is no evidence of acute life, limb, vision, or fertility threat. Patient has stable vital signs, pain is well controlled, patient is ambulatory and p.o. tolerant.  Discharge instructions were completed using the [employer] system. I would refer you to those at this time. All warnings prescriptions follow-up etc. were discussed in detail with the patient. Patient indicates understanding and is agreeable with this plan. All questions answered.  Patient is made aware that they may return to the emergency department for any worsening or new condition or for any other emergency. -- Risk: 5 This patient has a high risk of morbidity due to further diagnostic testing or treatment.  Rationale: This patient's evaluation and management involve a high risk of morbidity due to the potential severity of presenting symptoms, need for diagnostic testing, and/or initiation of treatment that may require close monitoring. The differential includes conditions with potential for significant deterioration or requiring escalation of care. Treatment decisions in the ED, including medication administration, procedural interventions, or disposition planning, reflect this level of risk. Additional Support: Evaluation for ACS -- Drug therapy requiring intensive monitoring for toxicity [ ]  -- Decision regarding elective major surgery with idenitified patient or procedure risk factors [ ]  -- Decision regarding hospitalization or escalation of  hospital-level care [ ]  -- Decision not to resuscitate or to de-escalate care because of poor prognosis [ ]  -- Parental controlled substances [ ]   COPA: 5 The patient has a severe exacerbation, progression, or side effect of treatment of the following illness/illnesses: []  OR  The patient has the following acute or chronic illness/injury that poses a possible threat to life or bodily function: [X] : The patient has a potentially serious acute condition or an acute exacerbation of a chronic illness requiring urgent evaluation and management in the Emergency Department. The clinical presentation necessitates immediate consideration of life-threatening or function-threatening diagnoses, even if they are ultimately ruled out.  Data(2/3 categories following were performed): 5 I reviewed or ordered at least three unique tests, external notes, and/or the history required an independent historian as one of the three requirements as following: CBC, BMP, troponin x 2, daughter at bedside, pulmonology note from yesterday AND  I independently interpreted the following test: X-ray of chest OR  I discussed the management of the patient with the following external  physician or qualified healthcare provider: []   Suggested E/M Coding Level: 5, 99285, This has been selected based on the 05/14/2022 CPT guidelines for E/M codes in the Emergency Department based on 2/3 of the CoPA, Data, and Risk.   FINAL CLINICAL IMPRESSION(S) / ED DIAGNOSES   Final diagnoses:  Chest pain, unspecified type     Rx / DC Orders   ED Discharge Orders     None        Note:  This document was prepared using Dragon voice recognition software and may include unintentional dictation errors.   Nicholaus Rolland BRAVO, MD 04/28/24 [phone]

## 2024-04-28 NOTE — ED Notes (Signed)
 First Nurse Note: Pt to ED via [facility] from home for chest pain. Pt was given 324 mg of Aspirin. Pt woke up with chest pain. BP 160/86. Pt is in NAD. Pt is not having chest pain at this time.

## 2024-04-28 NOTE — Discharge Instructions (Signed)
 You were seen in the emergency department for chest pain.  Workup today was reassuring but this does not mean that nothing is wrong.  Rather you are safe to continue the workup as an outpatient.  Please continue your regular medications.  Follow-up with cardiology as already scheduled tomorrow.  Return with any acutely worsening symptoms or any other emergency.  It was very nice meeting you and I wish you the best of luck. -- RETURN PRECAUTIONS & AFTERCARE: (ENGLISH) RETURN PRECAUTIONS: Return immediately to the emergency department or see/call your doctor if you feel worse, weak or have changes in speech or vision, are short of breath, have fever, vomiting, pain, bleeding or dark stool, trouble urinating or any new issues. Return here or see/call your doctor if not improving as expected for your suspected condition. FOLLOW-UP CARE: Call your doctor and/or any doctors we referred you to for more advice and to make an appointment. Do this today, tomorrow or after the weekend. Some doctors only take PPO insurance so if you have HMO insurance you may want to contact your HMO or your regular doctor for referral to a specialist within your plan. Either way tell the doctor's office that it was a referral from the emergency department so you get the soonest possible appointment.  YOUR TEST RESULTS: Take result reports of any blood or urine tests, imaging tests and EKG's to your doctor and any referral doctor. Have any abnormal tests repeated. Your doctor or a referral doctor can let you know when this should be done. Also make sure your doctor contacts this hospital to get any test results that are not currently available such as cultures or special tests for infection and final imaging reports, which are often not available at the time you leave the ER but which may list additional important findings that are not documented on the preliminary report. BLOOD PRESSURE: If your blood pressure was greater than 120/80  have your blood pressure rechecked within 1 to 2 weeks. MEDICATION SIDE EFFECTS: Do not drive, walk, bike, take the bus, etc. if you have received or are being prescribed any sedating medications such as those for pain or anxiety or certain antihistamines like Benadryl. If you have been give one of these here get a taxi home or have a friend drive you home. Ask your pharmacist to counsel you on potential side effects of any new medication

## 2024-04-29 ENCOUNTER — Ambulatory Visit: Admitting: Physician Assistant

## 2024-04-29 ENCOUNTER — Encounter: Payer: Self-pay | Admitting: Physician Assistant

## 2024-04-29 VITALS — BP 106/92 | HR 93 | Ht 62.0 in | Wt 262.9 lb

## 2024-04-29 DIAGNOSIS — R0789 Other chest pain: Secondary | ICD-10-CM

## 2024-04-29 DIAGNOSIS — R5383 Other fatigue: Secondary | ICD-10-CM

## 2024-04-29 DIAGNOSIS — R12 Heartburn: Secondary | ICD-10-CM | POA: Diagnosis not present

## 2024-04-29 DIAGNOSIS — K449 Diaphragmatic hernia without obstruction or gangrene: Secondary | ICD-10-CM

## 2024-04-29 DIAGNOSIS — J8410 Pulmonary fibrosis, unspecified: Secondary | ICD-10-CM

## 2024-04-29 DIAGNOSIS — R1319 Other dysphagia: Secondary | ICD-10-CM

## 2024-04-29 DIAGNOSIS — R42 Dizziness and giddiness: Secondary | ICD-10-CM

## 2024-04-29 DIAGNOSIS — R195 Other fecal abnormalities: Secondary | ICD-10-CM | POA: Diagnosis not present

## 2024-04-29 DIAGNOSIS — R198 Other specified symptoms and signs involving the digestive system and abdomen: Secondary | ICD-10-CM

## 2024-04-29 LAB — CULTURE, URINE COMPREHENSIVE

## 2024-04-29 NOTE — Progress Notes (Unsigned)
 Established patient visit  Patient: Faith Padilla   DOB: 03/25/1951   73 y.o. Female  MRN: [id number] Visit Date: 04/29/2024  Today's healthcare provider: Jolynn Spencer, PA-C   Chief Complaint  Patient presents with  . Follow-up    Patient was seen at ED on 04/28/24 for chest pain. Patient was treated with Protonix  IV and a GI cocktail due hiatal hernia. EKG and CXR completed. Patient was seen by cardio this morning. Patient reports he is doing a echo and stress test. She reports still having SOB and no chest pains.    Subjective     HPI     Follow-up    Additional comments: Patient was seen at ED on 04/28/24 for chest pain. Patient was treated with Protonix  IV and a GI cocktail due hiatal hernia. EKG and CXR completed. Patient was seen by cardio this morning. Patient reports he is doing a echo and stress test. She reports still having SOB and no chest pains.       Last edited by Lilian Fitzpatrick, CMA on 04/29/2024  2:15 PM.       Discussed the use of AI scribe software for clinical note transcription with the patient, who gave verbal consent to proceed.  History of Present Illness Faith Padilla is a 73 year old female with pulmonary fibrosis and hiatal hernia who presents with dizziness and chest heaviness.  She experiences significant dizziness and chest heaviness, with dizziness severe enough to make standing difficult and causing exhaustion. These symptoms have persisted since her recovery from pneumonia in early August. She visited the emergency department on August 13th for dehydration, dizziness, and chest heaviness. She continues to use inhalers, including albuterol , but sometimes forgets to take them due to fatigue.  She has a large hiatal hernia, contributing to voice changes, difficulty swallowing, heartburn, and gastrointestinal issues such as black stools. She experiences alternating constipation and diarrhea, managed by eating nuts to aid bowel movements. She denies  taking iron supplements and is not anemic.  Her diet includes protein shakes to help with medication intake, and she avoids foods that exacerbate her symptoms, such as chocolate, oranges, coffee, tomatoes, and spicy foods. She drinks green tea and water, aiming for three bottles of water daily, although she does not always meet this goal. She experiences urinary incontinence and has been prescribed medication for this condition.       02/29/2024    2:23 PM 12/21/2023    1:11 PM 10/23/2023    1:14 PM  Depression screen PHQ 2/9  Decreased Interest 0  3  Down, Depressed, Hopeless 0  3  PHQ - 2 Score 0  6  Altered sleeping 0  2  Tired, decreased energy 1  3  Change in appetite 2  2  Feeling bad or failure about yourself  0  0  Trouble concentrating 2  3  Moving slowly or fidgety/restless 0  0  Suicidal thoughts 0  0  PHQ-9 Score 5  16  Difficult doing work/chores Somewhat difficult  Very difficult     Information is confidential and restricted. Go to Review Flowsheets to unlock data.      02/29/2024    2:23 PM 12/21/2023    1:10 PM 10/23/2023    1:14 PM 10/16/2023    8:45 AM  GAD 7 : Generalized Anxiety Score  Nervous, Anxious, on Edge 0  0 0  Control/stop worrying 0  0 0  Worry too much - different things 0  3 0  Trouble relaxing 0  0 0  Restless 0  0 0  Easily annoyed or irritable 0  0 0  Afraid - awful might happen 0  0 0  Total GAD 7 Score 0  3 0  Anxiety Difficulty Not difficult at all  Very difficult      Information is confidential and restricted. Go to Review Flowsheets to unlock data.    Medications: Outpatient Medications Prior to Visit  Medication Sig  . amoxicillin -clavulanate (AUGMENTIN ) 875-125 MG tablet Take 1 tablet by mouth 2 (two) times daily.  . atorvastatin  (LIPITOR) 20 MG tablet TAKE 1 TABLET BY MOUTH EVERY DAY  . benzonatate  (TESSALON  PERLES) 100 MG capsule Take 1 capsule (100 mg total) by mouth 3 (three) times daily as needed for cough.  . Biotin 1 MG  CAPS Take 1 capsule by mouth daily.  . budesonide -formoterol  (SYMBICORT ) 160-4.5 MCG/ACT inhaler Inhale 2 puffs into the lungs 2 (two) times daily.  . CALCIUM  PO Take by mouth.  . cetirizine  (ZYRTEC ) 10 MG tablet TAKE 1 TABLET BY MOUTH EVERY DAY  . clotrimazole -betamethasone  (LOTRISONE ) cream Apply 1 Application topically 2 (two) times daily.  . fluconazole  (DIFLUCAN ) 100 MG tablet Take 1 tablet (100 mg total) by mouth daily.  SABRA ipratropium (ATROVENT ) 0.03 % nasal spray PLACE 2 SPRAYS INTO BOTH NOSTRILS EVERY 12 HOURS USE IN PLACE OF FLONASE   . ipratropium-albuterol  (DUONEB) 0.5-2.5 (3) MG/3ML SOLN Take 3 mLs by nebulization every 4 (four) hours as needed.  . levothyroxine  (SYNTHROID ) 75 MCG tablet Take 1 tablet (75 mcg total) by mouth daily before breakfast.  . meloxicam  (MOBIC ) 15 MG tablet Take 1 tablet by mouth daily.  . montelukast  (SINGULAIR ) 10 MG tablet TAKE 1 TABLET(10 MG) BY MOUTH AT BEDTIME  . nystatin  (MYCOSTATIN /NYSTOP ) powder Apply 1 Application topically 3 (three) times daily.  . omeprazole  (PRILOSEC) 20 MG capsule TAKE 1 CAPSULE(20 MG) BY MOUTH TWICE DAILY BEFORE A MEAL  . oxybutynin  (DITROPAN -XL) 10 MG 24 hr tablet Take 1 tablet (10 mg total) by mouth daily.  . trimethoprim  (TRIMPEX ) 100 MG tablet Take 1 tablet (100 mg total) by mouth daily.  . venlafaxine  XR (EFFEXOR -XR) 75 MG 24 hr capsule Take 1 capsule (75 mg total) by mouth daily with breakfast. Start after completing 37.5 mg daily for one week  . DULoxetine  (CYMBALTA ) 30 MG capsule Take 1 capsule (30 mg total) by mouth daily. Total of 90 mg daily. Take along with 60 mg cap (Patient not taking: Reported on 04/29/2024)  . DULoxetine  (CYMBALTA ) 60 MG capsule Take 1 capsule (60 mg total) by mouth daily. Reported on 12/26/2015 (Patient not taking: Reported on 04/29/2024)  . predniSONE  (DELTASONE ) 10 MG tablet 4 tabs for 3 days, then 3 tabs for 3 days, 2 tabs for 3 days, then 1 tab for 3 days, then stop (Patient not taking: Reported  on 04/29/2024)  . tirzepatide  (ZEPBOUND ) 2.5 MG/0.5ML injection vial Inject 2.5 mg into the skin once a week. (Patient not taking: Reported on 04/25/2024)  . venlafaxine  XR (EFFEXOR -XR) 37.5 MG 24 hr capsule Take 1 capsule (37.5 mg total) by mouth daily with breakfast for 7 days. (Patient not taking: Reported on 04/29/2024)   Facility-Administered Medications Prior to Visit  Medication Dose Route Frequency Provider  . albuterol  (PROVENTIL ) (2.5 MG/3ML) 0.083% nebulizer solution 2.5 mg  2.5 mg Nebulization Q4H Cobb, Comer GAILS, NP    Review of Systems All negative Except see HPI   {Insert previous labs (optional):23779} {See past labs  Heme  Chem  Endocrine  Serology  Results Review (optional):1}   Objective    BP (!) 106/92 (BP Location: Left Arm, Patient Position: Sitting, Cuff Size: Large)   Pulse 93   Ht 5' 2 (1.575 m)   Wt 262 lb 14.4 oz (119.3 kg)   SpO2 97%   BMI 48.09 kg/m  {Insert last BP/Wt (optional):23777}{See vitals history (optional):1}   Physical Exam Vitals reviewed.  Constitutional:      General: She is not in acute distress.    Appearance: Normal appearance. She is well-developed. She is not diaphoretic.  HENT:     Head: Normocephalic and atraumatic.  Eyes:     General: No scleral icterus.    Conjunctiva/sclera: Conjunctivae normal.  Neck:     Thyroid : No thyromegaly.  Cardiovascular:     Rate and Rhythm: Normal rate and regular rhythm.     Pulses: Normal pulses.     Heart sounds: Normal heart sounds. No murmur heard. Pulmonary:     Effort: Pulmonary effort is normal. No respiratory distress.     Breath sounds: Normal breath sounds. No wheezing, rhonchi or rales.  Musculoskeletal:     Cervical back: Neck supple.     Right lower leg: No edema.     Left lower leg: No edema.  Lymphadenopathy:     Cervical: No cervical adenopathy.  Skin:    General: Skin is warm and dry.     Findings: No rash.  Neurological:     Mental Status: She is alert and  oriented to person, place, and time. Mental status is at baseline.  Psychiatric:        Mood and Affect: Mood normal.        Behavior: Behavior normal.      No results found for any visits on 04/29/24.      Assessment & Plan Heartburn/chest pain/dysphagia Large hiatal hernia with gastroesophageal reflux Large hiatal hernia causing significant symptoms including heartburn, dysphagia, and voice changes. Symptoms exacerbated by certain foods and beverages. - Refer to gastroenterologist for evaluation and management. - Advise avoidance of symptom-exacerbating foods and beverages. - Encourage consumption of yogurt and easily digestible foods. - Instruct to contact gastroenterologist for follow-up and potential surgical consultation.  Pulmonary fibrosis Pulmonary fibrosis well-managed with inhalers. Recent cardiology evaluation showed no worsening. - Continue use of inhalers as prescribed. - Encourage adherence to inhaler regimen.  Dizziness and lightheadedness Ongoing dizziness and lightheadedness potentially related to large hiatal hernia and recent dehydration. - Ensure adequate hydration with at least three bottles of water per day.  Fatigue Persistent fatigue possibly related to large hiatal hernia, recent pneumonia, and overall health status. - Encourage adequate hydration and nutrition.  Melena (black stools) Intermittent melena possibly related to gastrointestinal issues including large hiatal hernia. - Instruct to follow up with gastroenterologist for further evaluation.  Alternating constipation and diarrhea Alternating constipation and diarrhea potentially related to dietary factors and gastrointestinal issues. - Advise to continue dietary modifications. - Encourage consumption of easily digestible foods and adequate hydration.   No orders of the defined types were placed in this encounter.   No follow-ups on file.   The patient was advised to call back or seek an  in-person evaluation if the symptoms worsen or if the condition fails to improve as anticipated.  I discussed the assessment and treatment plan with the patient. The patient was provided an opportunity to ask questions and all were answered. The patient agreed with the plan and demonstrated an understanding of  the instructions.  I, Kaeo Jacome, PA-C have reviewed all documentation for this visit. The documentation on 04/29/2024  for the exam, diagnosis, procedures, and orders are all accurate and complete.  Jolynn Spencer, [facility], MMS [facility] [phone] (phone) [phone] (fax)  [facility]

## 2024-05-02 ENCOUNTER — Other Ambulatory Visit: Payer: Self-pay | Admitting: Cardiology

## 2024-05-02 DIAGNOSIS — E782 Mixed hyperlipidemia: Secondary | ICD-10-CM

## 2024-05-02 DIAGNOSIS — R0609 Other forms of dyspnea: Secondary | ICD-10-CM

## 2024-05-02 DIAGNOSIS — R1319 Other dysphagia: Secondary | ICD-10-CM | POA: Insufficient documentation

## 2024-05-02 DIAGNOSIS — R12 Heartburn: Secondary | ICD-10-CM | POA: Insufficient documentation

## 2024-05-02 DIAGNOSIS — R0789 Other chest pain: Secondary | ICD-10-CM | POA: Insufficient documentation

## 2024-05-02 DIAGNOSIS — R195 Other fecal abnormalities: Secondary | ICD-10-CM | POA: Insufficient documentation

## 2024-05-04 ENCOUNTER — Other Ambulatory Visit: Payer: Self-pay | Admitting: Family Medicine

## 2024-05-04 ENCOUNTER — Other Ambulatory Visit: Payer: Self-pay | Admitting: Psychiatry

## 2024-05-08 ENCOUNTER — Other Ambulatory Visit: Payer: Self-pay | Admitting: Psychiatry

## 2024-05-10 ENCOUNTER — Ambulatory Visit (INDEPENDENT_AMBULATORY_CARE_PROVIDER_SITE_OTHER): Admitting: Professional Counselor

## 2024-05-10 DIAGNOSIS — F3341 Major depressive disorder, recurrent, in partial remission: Secondary | ICD-10-CM

## 2024-05-10 DIAGNOSIS — F4310 Post-traumatic stress disorder, unspecified: Secondary | ICD-10-CM
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
## 2024-05-10 NOTE — Progress Notes (Signed)
  THERAPIST PROGRESS NOTE  Session Time: 10:00 AM - 10:53 AM  Participation Level: Active  Behavioral Response: Casual, Alert, Anxious  Type of Therapy: Individual Therapy  Treatment Goals addressed: Active Depression  LTG: I guess when things get to where I can say what I want to say. Stand up for myself. Accept and know I can't change her.  (Progressing)    Start:  02/15/24    Expected End:  02/13/25    STG: To improve assertiveness AEB utilizing interpersonal effectiveness skills over the next 90 days.  (Progressing)  Goal Note Reviewed 05/10/24 I've got this neighbor. She doesn't speak English very much. When she comes visit, she just walks right in. It happened a lot when I was sick, I just left the door unlocked, but now she just walks right in. I told her she's gonna have to ring the doorbell. This was yesterday.   STG: To reduce judgementalness AEB practicing mindfulness skills and focusing on what's within Vollie's control over the next 90 days. (Progressing)  Goal Note Reviewed 05/10/24 I'm ill with the cat again and I'm ill with the lady who works in the [employer].   STG: To improve memory AEB utilizing mindfulness skills over the next 12 weeks (Initial)   ProgressTowards Goals: Progressing  Interventions: Motivational Interviewing, Conservator, museum/gallery, and Supportive  Summary: Rebbie Lauricella is a 73 y.o. female who presents with a history of depression and PTSD. She appeared alert and oriented x5. She reported she was sick for a few weeks with pneumonia. Lauriana reported updates around personal relationships and family. She noted progress on goals and areas for continued improvement. She engaged in discussion about communication and how she would like to show up with others. Haleemah stated she preferred to be honest rather than avoiding conversations. She was in agreement that she is not responsible for how other people perceive her. She reported she will try some of the  things discussed today.   Therapist Response: Conducted session with Kyra. Began session with check-in/update since previous session. Utilized empathetic and reflective listening. Used open-ended questions to facilitate discussion and summarized Rashawn's thoughts/feelings. Explored assertiveness and how Xavia would like to show up when communicating with others. Reminded Cyncere of the things within her control and outside of her control to reduce energy wasted on things that are outside of her control (other people's thoughts/feelings/perceptions). Scheduled additional appointment and concluded session.   Suicidal/Homicidal: No  Plan: Return again in 3 weeks.  Diagnosis: MDD (major depressive disorder), recurrent, in partial remission (HCC)  PTSD (post-traumatic stress disorder)  Collaboration of Care: Medication Management AEB chart review  Patient/Guardian was advised Release of Information must be obtained prior to any record release in order to collaborate their care with an outside provider. Patient/Guardian was advised if they have not already done so to contact the registration department to sign all necessary forms in order for us  to release information regarding their care.   Consent: Patient/Guardian gives verbal consent for treatment and assignment of benefits for services provided during this visit. Patient/Guardian expressed understanding and agreed to proceed.   Almarie JONETTA Ligas, [facility] 05/10/2024

## 2024-05-16 ENCOUNTER — Ambulatory Visit (INDEPENDENT_AMBULATORY_CARE_PROVIDER_SITE_OTHER): Admitting: Surgery

## 2024-05-16 ENCOUNTER — Telehealth: Payer: Self-pay

## 2024-05-16 ENCOUNTER — Encounter: Payer: Self-pay | Admitting: Psychiatry

## 2024-05-16 ENCOUNTER — Encounter: Payer: Self-pay | Admitting: Surgery

## 2024-05-16 ENCOUNTER — Ambulatory Visit (INDEPENDENT_AMBULATORY_CARE_PROVIDER_SITE_OTHER): Admitting: Psychiatry

## 2024-05-16 ENCOUNTER — Other Ambulatory Visit: Payer: Self-pay

## 2024-05-16 VITALS — BP 124/76 | HR 99 | Temp 98.2°F | Ht 62.0 in | Wt 263.6 lb

## 2024-05-16 VITALS — BP 120/72 | HR 99 | Ht 62.0 in | Wt 261.0 lb

## 2024-05-16 DIAGNOSIS — G4700 Insomnia, unspecified: Secondary | ICD-10-CM | POA: Diagnosis not present

## 2024-05-16 DIAGNOSIS — Z6841 Body Mass Index (BMI) 40.0 and over, adult: Secondary | ICD-10-CM | POA: Diagnosis not present

## 2024-05-16 DIAGNOSIS — K449 Diaphragmatic hernia without obstruction or gangrene: Secondary | ICD-10-CM | POA: Diagnosis not present

## 2024-05-16 DIAGNOSIS — K219 Gastro-esophageal reflux disease without esophagitis: Secondary | ICD-10-CM | POA: Diagnosis not present

## 2024-05-16 DIAGNOSIS — F4310 Post-traumatic stress disorder, unspecified: Secondary | ICD-10-CM | POA: Diagnosis not present

## 2024-05-16 DIAGNOSIS — F3341 Major depressive disorder, recurrent, in partial remission: Secondary | ICD-10-CM | POA: Diagnosis not present

## 2024-05-16 MED ORDER — VENLAFAXINE HCL ER 150 MG PO CP24: 150.0000 mg | ORAL_CAPSULE | Freq: Every day | ORAL | 1 refills | Status: DC

## 2024-05-16 NOTE — Patient Instructions (Addendum)
 We have contacted Dr Jinny and they will call you about scheduling your Upper endoscopy.   We have scheduled you for a CT Scan of your Abdomen and Pelvis with contrast and Barium Swallow. This has been scheduled at [facility] on 06/10/24. Please arrive there by 9:30 am and enter in through the Medical Mall entrance. If you need to reschedule your Scan, you may do so by calling [phone]. Please let us  know if you reschedule your scan as we have to get authorization from your insurance for this.   Follow-up with our office in 6 weeks with Dr Jordis to review your results.   Please call and ask to speak with a nurse if you develop questions or concerns.   Hiatal Hernia  A hiatal hernia occurs when part of the stomach slides above the muscle that separates the abdomen from the chest (diaphragm). A person can be born with a hiatal hernia (congenital), or it may develop over time. In almost all cases of hiatal hernia, only the top part of the stomach pushes through the diaphragm. Many people have a hiatal hernia with no symptoms. The larger the hernia, the more likely it is that you will have symptoms. In some cases, a hiatal hernia allows stomach acid to flow back into the tube that carries food from your mouth to your stomach (esophagus). This may cause heartburn symptoms. The development of heartburn symptoms may mean that you have a condition called gastroesophageal reflux disease (GERD). What are the causes? This condition is caused by a weakness in the opening (hiatus) where the esophagus passes through the diaphragm to attach to the upper part of the stomach. A person may be born with a weakness in the hiatus, or a weakness can develop over time. What increases the risk? This condition is more likely to develop in: Older people. Age is a major risk factor for a hiatal hernia, especially if you are over the age of 80. Pregnant women. People who are overweight. People who have frequent  constipation. What are the signs or symptoms? Symptoms of this condition usually develop in the form of GERD symptoms. Symptoms include: Heartburn. Upset stomach (indigestion). Trouble swallowing. Coughing or wheezing. Wheezing is making high-pitched whistling sounds when you breathe. Sore throat. Chest pain. Nausea and vomiting. How is this diagnosed? This condition may be diagnosed during testing for GERD. Tests that may be done include: X-rays of your stomach or chest. An upper gastrointestinal (GI) series. This is an X-ray exam of your GI tract that is taken after you swallow a chalky liquid that shows up clearly on the X-ray. Endoscopy. This is a procedure to look into your stomach using a thin, flexible tube that has a tiny camera and light on the end of it. How is this treated? This condition may be treated by: Dietary and lifestyle changes to help reduce GERD symptoms. Medicines. These may include: Over-the-counter antacids. Medicines that make your stomach empty more quickly. Medicines that block the production of stomach acid (H2 blockers). Stronger medicines to reduce stomach acid (proton pump inhibitors). Surgery to repair the hernia, if other treatments are not helping. If you have no symptoms, you may not need treatment. Follow these instructions at home: Lifestyle and activity Do not use any products that contain nicotine or tobacco. These products include cigarettes, chewing tobacco, and vaping devices, such as e-cigarettes. If you need help quitting, ask your health care provider. Try to achieve and maintain a healthy body weight. Avoid putting  pressure on your abdomen. Anything that puts pressure on your abdomen increases the amount of acid that may be pushed up into your esophagus. Avoid bending over, especially after eating. Raise the head of your bed by putting blocks under the legs. This keeps your head and esophagus higher than your stomach. Do not wear tight  clothing around your chest or stomach. Try not to strain when having a bowel movement, when urinating, or when lifting heavy objects. Eating and drinking Avoid foods that can worsen GERD symptoms. These may include: Fatty foods, like fried foods. Citrus fruits, like oranges or lemon. Other foods and drinks that contain acid, like orange juice or tomatoes. Spicy food. Chocolate. Eat frequent small meals instead of three large meals a day. This helps prevent your stomach from getting too full. Eat slowly. Do not lie down right after eating. Do not eat 1-2 hours before bed. Do not drink beverages with caffeine. These include cola, coffee, cocoa, and tea. Do not drink alcohol. General instructions Take over-the-counter and prescription medicines only as told by your health care provider. Keep all follow-up visits. Your health care provider will want to check that any new prescribed medicines are helping your symptoms. Contact a health care provider if: Your symptoms are not controlled with medicines or lifestyle changes. You are having trouble swallowing. You have coughing or wheezing that will not go away. Your pain is getting worse. Your pain spreads to your arms, neck, jaw, teeth, or back. You feel nauseous or you vomit. Get help right away if: You have shortness of breath. You vomit blood. You have bright red blood in your stools. You have black, tarry stools. These symptoms may be an emergency. Get help right away. Call 911. Do not wait to see if the symptoms will go away. Do not drive yourself to the hospital. Summary A hiatal hernia occurs when part of the stomach slides above the muscle that separates the abdomen from the chest. A person may be born with a weakness in the hiatus, or a weakness can develop over time. Symptoms of a hiatal hernia may include heartburn, trouble swallowing, or sore throat. Management of a hiatal hernia includes eating frequent small meals instead  of three large meals a day. Get help right away if you vomit blood, have bright red blood in your stools, or have black, tarry stools. This information is not intended to replace advice given to you by your health care provider. Make sure you discuss any questions you have with your health care provider. Document Revised: 10/22/2021 Document Reviewed: 10/22/2021 Elsevier Patient Education  2024 [employer].

## 2024-05-16 NOTE — Telephone Encounter (Addendum)
 EGD scheduled and clearance faxed to cardiology and pulmonology

## 2024-05-16 NOTE — Patient Instructions (Signed)
 Increase venlafaxine  150 mg daily  Next appointment: 10/30 at 11 am

## 2024-05-16 NOTE — Progress Notes (Signed)
 Patient ID: Faith Padilla, female   DOB: 04-21-1951, 73 y.o.   MRN: [id number]  HPI Faith Padilla is a 73 y.o. female in consultation at the request of Dr. Nicholaus She did have recent visit due to shortness of breath and chest pain.  This was atypical chest pain.  She underwent a CT angio of her chest that I have personally reviewed showing no evidence of PE but a large type III paraesophageal hernia. Reports that the pain was atypical is intermittent she does have some associated shortness of breath.  She does have a history of pulmonary fibrosis COPD.  She is not on oxygen Did have significant cardiac workup in the emergency room showing no evidence of troponin leak. Uses a cane.  She did well with hip replacement last year BC and BMP was normal.  Does have some intermittent reflux and some mild dysphagia for pills. sHe Does have an upcoming appointment with cardiology and a pending echo and a stress test. Does state that prior to her hip replacement she lost weight and she was able to lose about 40 pounds or so.  She is confident that she can do it again Prior abdominal  hysterectomy   HPI  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   Diverticulitis    Dry eye syndrome of both eyes 08/13/2021   Dyspnea on exertion 08/13/2021   Family history of problems related to stress 11/15/2021   Frequency of urination and  polyuria 06/25/2022   GAD (generalized anxiety disorder) 08/15/2021   Glaucoma    H/O urinary incontinence 06/25/2022   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   High risk medication use 08/02/2018   HLD (hyperlipidemia)    Iron deficiency 12/04/2019   Isolation (social) 12/07/2021   Kidney stones    Microscopic hematuria    Mixed incontinence    Morbid obesity (HCC)    Ocular hypertension, 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   Ocular migraine 08/02/2018   Osteoarthritis    lumbar spine   Osteoarthritis of right knee 08/28/2023   Palpitations 08/13/2021   Plantar fasciitis 06/25/2023   Post covid-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, 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   Postoperative follow-up 03/30/2020   Prediabetes 08/14/2015   Pseudophakia, both eyes 06/26/2016   Pulmonary fibrosis (HCC) 2020   Rectal bleeding 10/17/2021   Shortness of breath dyspnea    Sleep apnea    CPAP   Vaginal yeast infection    Wears dentures    partial upper    Past Surgical History:  Procedure Laterality Date   ABDOMINAL HYSTERECTOMY  09/08/1996   BACK SURGERY  1990/2014   CATARACT EXTRACTION, BILATERAL  09/08/2012   COLONOSCOPY N/A 12/17/2023   Procedure: COLONOSCOPY;  Surgeon: Jinny Carmine, MD;  Location: [facility] ENDOSCOPY;  Service: Endoscopy;  Laterality: N/A;   COLONOSCOPY WITH PROPOFOL  N/A 03/25/2016   Procedure: COLONOSCOPY WITH PROPOFOL ;  Surgeon: Carmine Jinny, MD;  Location: [facility] ENDOSCOPY;  Service: Endoscopy;  Laterality: N/A;   deviated nose septum surgery  09/08/1998   ESOPHAGOGASTRODUODENOSCOPY (EGD) WITH PROPOFOL  N/A 03/25/2016   Procedure: ESOPHAGOGASTRODUODENOSCOPY (EGD) WITH PROPOFOL ;  Surgeon: Rogelia Copping, MD;  Location: [facility] ENDOSCOPY;  Service: Endoscopy;  Laterality: N/A;   EYE SURGERY     JOINT REPLACEMENT     LITHOTRIPSY  09/08/2010   POLYPECTOMY  12/17/2023   Procedure: POLYPECTOMY, INTESTINE;  Surgeon: Copping Rogelia, MD;  Location: [facility] ENDOSCOPY;  Service: Endoscopy;;   TOTAL HIP ARTHROPLASTY Left     Family  History  Problem Relation Age of Onset   Depression Mother    Asthma Mother    Heart disease Mother    Diabetes Mother    Cancer Father        lung cancer   COPD Father    Liver cancer Father    Cancer Sister        Brain Cancer   Allergic rhinitis Sister    Asthma Sister    Depression Daughter    Hematuria Daughter    Bladder Cancer Neg Hx     Social History Social History   Tobacco Use   Smoking status: Never    Passive exposure: Never (Parent)   Smokeless tobacco: Never  Vaping Use   Vaping status: Never Used  Substance Use Topics   Alcohol use: No   Drug use: No    Allergies  Allergen Reactions   Morphine Hives    Current Outpatient Medications  Medication Sig Dispense Refill   allopurinol (ZYLOPRIM) 100 MG tablet Take 100 mg by mouth daily.     atorvastatin  (LIPITOR) 20 MG tablet TAKE 1 TABLET BY MOUTH EVERY DAY 90 tablet 0   Biotin 1 MG CAPS Take 1 capsule by mouth daily.     budesonide -formoterol  (SYMBICORT ) 160-4.5 MCG/ACT inhaler Inhale 2 puffs into the lungs 2 (two) times daily. 1 each 3   CALCIUM  PO Take by mouth.     cetirizine  (ZYRTEC ) 10 MG tablet TAKE 1 TABLET BY MOUTH EVERY DAY 90 tablet 0   clotrimazole -betamethasone  (LOTRISONE ) cream Apply 1 Application topically 2 (two) times daily. 30 g 0   fluticasone  (FLONASE ) 50 MCG/ACT nasal spray SHAKE LIQUID AND USE 2 SPRAYS IN EACH NOSTRIL DAILY     hydrOXYzine  (ATARAX ) 50 MG tablet TAKE 1 TABLET BY MOUTH DAILY AS NEEDED FOR ANXIETY OR SLEEP     ipratropium (ATROVENT ) 0.03 % nasal spray PLACE 2 SPRAYS INTO BOTH NOSTRILS EVERY 12 HOURS USE IN PLACE OF FLONASE  30 mL 5   ipratropium-albuterol  (DUONEB) 0.5-2.5 (3) MG/3ML SOLN Take 3 mLs by nebulization every 4 (four) hours as needed. 360 mL 3   levothyroxine  (SYNTHROID ) 75 MCG tablet Take 1 tablet (75 mcg total) by mouth daily before breakfast. 90 tablet 3   meloxicam  (MOBIC ) 15 MG tablet Take 1 tablet by mouth daily.     metroNIDAZOLE  (FLAGYL ) 500 MG tablet  TAKE 1 TABLET BY MOUTH TWICE DAILY UNTIL ALL TAKEN     Misc Natural Products (TURMERIC, CURCUMIN, PO) Take by mouth.     montelukast  (SINGULAIR ) 10 MG tablet TAKE 1 TABLET(10 MG) BY MOUTH AT BEDTIME 90 tablet 2   nystatin  (MYCOSTATIN /NYSTOP ) powder Apply 1 Application topically 3 (three) times daily. 60 g 3   omeprazole  (PRILOSEC) 20 MG capsule TAKE 1 CAPSULE(20 MG) BY MOUTH TWICE DAILY BEFORE A MEAL 180 capsule 0   oxybutynin  (DITROPAN -XL) 10 MG 24 hr tablet Take 1 tablet (10 mg total) by mouth daily. 30 tablet 11  trimethoprim  (TRIMPEX ) 100 MG tablet Take 1 tablet (100 mg total) by mouth daily. 30 tablet 11   [START ON 05/23/2024] venlafaxine  XR (EFFEXOR -XR) 150 MG 24 hr capsule Take 1 capsule (150 mg total) by mouth daily with breakfast. 30 capsule 1   venlafaxine  XR (EFFEXOR -XR) 75 MG 24 hr capsule Take 1 capsule (75 mg total) by mouth daily with breakfast. Start after completing 37.5 mg daily for one week 30 capsule 1   Current Facility-Administered Medications  Medication Dose Route Frequency Provider Last Rate Last Admin   albuterol  (PROVENTIL ) (2.5 MG/3ML) 0.083% nebulizer solution 2.5 mg  2.5 mg Nebulization Q4H Cobb, Comer GAILS, NP   2.5 mg at 04/14/24 1614     Review of Systems Full ROS  was asked and was negative except for the information on the HPI  Physical Exam Blood pressure 120/72, pulse 99, height 5' 2 (1.575 m), weight 261 lb (118.4 kg), SpO2 93%. CONSTITUTIONAL: NAd chronically ill BMI 47. EYES: Pupils are equal, round, Sclera are non-icteric. EARS, NOSE, MOUTH AND THROAT: The oropharynx is clear. The oral mucosa is pink and moist. Hearing is intact to voice. LYMPH NODES:  Lymph nodes in the neck are normal. RESPIRATORY:  Lungs are clear. There is normal respiratory effort, with equal breath sounds bilaterally, and without pathologic use of accessory muscles. CARDIOVASCULAR: Heart is regular without murmurs, gallops, or rubs. GI: The abdomen is  soft, nontender, and  nondistended. There are no palpable masses. There is no hepatosplenomegaly. There are normal bowel sounds in all quadrants. GU: Rectal deferred.   MUSCULOSKELETAL: Normal muscle strength and tone. No cyanosis or edema.   SKIN: Turgor is good and there are no pathologic skin lesions or ulcers. NEUROLOGIC: Motor and sensation is grossly normal. Cranial nerves are grossly intact. PSYCH:  Oriented to person, place and time. Affect is normal.  Data Reviewed I have personally reviewed the patient's imaging, laboratory findings and medical records.    Assessment/Plan 73 year old female with type III paraesophageal hernia causing significant symptoms.  She does have a generous BMI in the order of 47. I had a good discussion with the patient regarding her disease process.  I was very candid regarding the optimization of weight.  She wishes to continue further workup including a CT of the abdomen and pelvis as well as a barium swallow and an EGD to continue workup. I do think that some of the atypical chest pain might be related to the paraesophageal hernia and given the size it might be causing some compression symptoms. With that Being said I am not in a rush  and she needs to be optimized prior to elective surgical intervention. She will talk to PCP about weight reduction strategies, I can see her in 8 weeks or so. I personally spent a total of 60 minutes in the care of the patient today including performing a medically appropriate exam/evaluation, counseling and educating, placing orders, referring and communicating with other health care professionals, documenting clinical information in the EHR, independently interpreting and reviewing images studies and coordinating care.    Laneta Luna, MD FACS General Surgeon 05/16/2024, 2:43 PM

## 2024-05-17 ENCOUNTER — Ambulatory Visit: Admitting: Pulmonary Disease

## 2024-05-17 ENCOUNTER — Encounter: Payer: Self-pay | Admitting: Pulmonary Disease

## 2024-05-17 VITALS — BP 120/76 | HR 75 | Temp 97.9°F | Ht 62.0 in | Wt 263.6 lb

## 2024-05-17 DIAGNOSIS — J8410 Pulmonary fibrosis, unspecified: Secondary | ICD-10-CM

## 2024-05-17 DIAGNOSIS — Z23 Encounter for immunization: Secondary | ICD-10-CM | POA: Diagnosis not present

## 2024-05-17 DIAGNOSIS — K449 Diaphragmatic hernia without obstruction or gangrene: Secondary | ICD-10-CM

## 2024-05-17 DIAGNOSIS — Z6841 Body Mass Index (BMI) 40.0 and over, adult: Secondary | ICD-10-CM

## 2024-05-17 DIAGNOSIS — R0602 Shortness of breath: Secondary | ICD-10-CM

## 2024-05-17 DIAGNOSIS — Z01811 Encounter for preprocedural respiratory examination: Secondary | ICD-10-CM

## 2024-05-17 LAB — NITRIC OXIDE: Nitric Oxide: 5

## 2024-05-17 NOTE — Patient Instructions (Signed)
 VISIT SUMMARY:  Today, we discussed your large hiatal hernia and its impact on your breathing and overall health. We reviewed your symptoms, including shortness of breath, cough, and changes in your voice. We also talked about your history of recurrent pneumonia and the importance of managing your weight to improve your condition.  YOUR PLAN:  -HIATAL HERNIA WITH PULMONARY AND GASTROINTESTINAL SYMPTOMS: A hiatal hernia occurs when part of the stomach pushes up into the chest through an opening in the diaphragm. This can cause pressure on your lungs and esophagus, leading to symptoms like shortness of breath and cough. We will proceed with an EGD to assess the size of the hernia and coordinate with Dr. Jordis for potential surgical intervention. Post-operative lung exercises using an incentive spirometer will be important for your recovery.  -HISTORY OF RECURRENT PNEUMONIA: Recurrent pneumonia is likely due to aspiration, where stomach contents reflux into the lungs, especially during sleep. This is related to your hiatal hernia. We have administered the pneumococcal vaccine to help prevent future pneumonia episodes.  -SHORTNESS OF BREATH AND COUGH: Your shortness of breath and cough are caused by the hiatal hernia pressing on your lungs and esophagus. Your lung function tests were satisfactory, indicating that the issue is mechanical due to the hernia. We will check your airway inflammation levels to ensure there are no other contributing factors.  -OVERWEIGHT: Being overweight can increase the pressure from the hernia and affect your surgical outcomes. We recommend discussing weight loss strategies with your primary care provider and considering a referral for a weight management program or medication.  INSTRUCTIONS:  Please follow up with Dr. Jordis for the surgical consultation regarding your hiatal hernia. Additionally, schedule an appointment with your primary care provider to discuss weight loss  strategies. Continue using the incentive spirometer as instructed post-surgery to aid in your lung recovery.

## 2024-05-17 NOTE — Progress Notes (Unsigned)
 Subjective:    Patient ID: Faith Padilla, female    DOB: [birth year], 73 y.o.   MRN: [id number]  Patient Care Team: Donzella Lauraine SAILOR, DO as PCP - General (Family Medicine) Kassie Acquanetta Bradley, MD as Consulting Physician (Pulmonary Disease) Gaston Hamilton, MD as Consulting Physician (Urology) [facility], [facility] (Orthopedic Surgery) Shilt, Norleen Barter, PA-C (Orthopedic Surgery) Tobie Arleta SQUIBB, MD as Consulting Physician (Allergy  and Immunology) [place], PA-C as Physician Assistant (Gastroenterology) Pa, [facility])  Chief Complaint  Patient presents with  . Shortness of Breath    Cough with white/yellow phlegm. Shortness of breath on exertion and at rest. Occasional wheezing.     BACKGROUND:   HPI    Review of Systems A 10 point review of systems was performed and it is as noted above otherwise negative.   Past Medical History:  Diagnosis Date  . Abdominal pain, right upper quadrant   . Abdominal pain, RUQ (right upper quadrant) 03/12/2016  . Achilles tendinitis of right lower extremity 06/25/2023  . Acid reflux   . Allergic conjunctivitis of both eyes 06/29/2017  . Anemia 12/04/2019  . Anxiety and depression   . Asthma   . 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) 08/13/2021  . Complication of anesthesia    slow to awaken, wakes coughing and SOB  . COPD (chronic obstructive pulmonary disease) (HCC)   . Depression   . 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 Diverticulitis   . Dry eye syndrome of both eyes 08/13/2021  . Dyspnea on exertion 08/13/2021  . Family history of problems related to stress 11/15/2021  . Frequency of urination and polyuria 06/25/2022  . GAD (generalized anxiety disorder) 08/15/2021  . Glaucoma   . H/O urinary incontinence 06/25/2022  . 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  . High risk medication use 08/02/2018  . HLD (hyperlipidemia)   .  Iron deficiency 12/04/2019  . Isolation (social) 12/07/2021  . Kidney stones   . Microscopic hematuria   . Mixed incontinence   . Morbid obesity (HCC)   . Ocular hypertension, 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  . Ocular migraine 08/02/2018  . Osteoarthritis    lumbar spine  . Osteoarthritis of right knee 08/28/2023  . Palpitations 08/13/2021  . Plantar fasciitis 06/25/2023  . Post covid-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, 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  . Postoperative follow-up 03/30/2020  . Prediabetes 08/14/2015  . Pseudophakia, both eyes 06/26/2016  . Pulmonary fibrosis (HCC) 2020  . Rectal bleeding 10/17/2021  . Shortness of breath dyspnea   . Sleep apnea    CPAP  . Vaginal yeast infection   . Wears dentures    partial upper    Past Surgical History:  Procedure Laterality Date  . ABDOMINAL HYSTERECTOMY  09/08/1996  . BACK SURGERY  1990/2014  . CATARACT EXTRACTION, BILATERAL  09/08/2012  . COLONOSCOPY N/A 12/17/2023   Procedure: COLONOSCOPY;  Surgeon: Jinny Carmine, MD;  Location: [facility] ENDOSCOPY;  Service: Endoscopy;  Laterality: N/A;  . COLONOSCOPY WITH PROPOFOL  N/A 03/25/2016   Procedure: COLONOSCOPY WITH PROPOFOL ;  Surgeon: Carmine Jinny, MD;  Location: [facility] ENDOSCOPY;  Service: Endoscopy;  Laterality: N/A;  . deviated nose septum surgery  09/08/1998  . ESOPHAGOGASTRODUODENOSCOPY (EGD) WITH PROPOFOL  N/A 03/25/2016   Procedure: ESOPHAGOGASTRODUODENOSCOPY (EGD) WITH PROPOFOL ;  Surgeon: Carmine Jinny, MD;  Location: [facility] ENDOSCOPY;  Service: Endoscopy;  Laterality: N/A;  . EYE SURGERY    . JOINT REPLACEMENT    . LITHOTRIPSY  09/08/2010  . POLYPECTOMY  12/17/2023  Procedure: POLYPECTOMY, INTESTINE;  Surgeon: Jinny Carmine, MD;  Location: [facility] ENDOSCOPY;  Service: Endoscopy;;  . TOTAL HIP ARTHROPLASTY Left     Patient Active Problem List   Diagnosis Date Noted  . Other dysphagia 05/02/2024  . Heartburn 05/02/2024  . Dark stools 05/02/2024  . Other chest pain 05/02/2024  . CAP (community acquired pneumonia)  04/15/2024  . History of colon polyps 12/17/2023  . Polyp of colon 12/17/2023  . Heme + stool 12/17/2023  . Candidal intertrigo 10/30/2023  . Dizziness 10/23/2023  . Dehydration 10/23/2023  . Black stool 10/23/2023  . Heme positive stool 10/23/2023  . Acute bronchitis 10/16/2023  . Chronic hypoxemic respiratory failure (HCC) 08/11/2023  . Encounter for weight loss counseling 08/03/2023  . Chronic gout without tophus 08/03/2023  . Thrombocytosis 07/20/2023  . Hypercalcemia 07/20/2023  . Elevated vitamin B12 level 07/20/2023  . Fibromyalgia 07/20/2023  . Pulmonary fibrosis (HCC) 07/20/2023  . Iron deficiency anemia secondary to inadequate dietary iron intake 07/20/2023  . Hypothyroidism 07/20/2023  . Age-related osteoporosis without current pathological fracture 07/20/2023  . Depression, major, single episode, in partial remission (HCC) 07/20/2023  . Chronic kidney disease, stage 2, mildly decreased GFR 07/20/2023  . Benign neoplasm of transverse colon   . Problems with swallowing and mastication   . Hiatal hernia   . Seasonal allergic conjunctivitis 12/17/2015  . Cystocele, grade 2 11/21/2015  . Microscopic hematuria 11/21/2015  . Esophageal motility disorder 11/19/2015  . Vertigo 10/15/2015  . Oral candidiasis 10/15/2015  . Subacute frontal sinusitis 10/10/2015  . Chest pain at rest 10/10/2015  . Prediabetes 08/14/2015  . Asthma with acute exacerbation 07/18/2015  . Dermatitis 05/31/2015  . Rosacea 05/31/2015  . Incontinence 04/12/2015  . Atrophic vaginitis 04/12/2015  . Osteoarthritis of right knee 03/29/2015  . Clinical depression 12/11/2014  . Diverticulitis 12/11/2014  . Auditory acuity evaluation 12/11/2014  . Glaucoma 12/11/2014  . Hot flash, menopausal 12/11/2014  . Calculus of kidney 12/11/2014  . Degenerative arthritis of lumbar spine 12/11/2014  . Obstructive sleep apnea on CPAP 12/11/2014  . Urge incontinence 12/11/2014  . Moderate persistent asthma with  allergic rhinitis 12/11/2014  . Hyperlipidemia 12/11/2014  . Gastroesophageal reflux disease 12/11/2014    Family History  Problem Relation Age of Onset  . Depression Mother   . Asthma Mother   . Heart disease Mother   . Diabetes Mother   . Cancer Father        lung cancer  . COPD Father   . Liver cancer Father   . Cancer Sister        Brain Cancer  . Allergic rhinitis Sister   . Asthma Sister   . Depression Daughter   . Hematuria Daughter   . Bladder Cancer Neg Hx     Social History   Tobacco Use  . Smoking status: Never    Passive exposure: Never (Parent)  . Smokeless tobacco: Never  Substance Use Topics  . Alcohol use: No    Allergies  Allergen Reactions  . Morphine Hives    Current Meds  Medication Sig  . atorvastatin  (LIPITOR) 20 MG tablet TAKE 1 TABLET BY MOUTH EVERY DAY  . Biotin 1 MG CAPS Take 1 capsule by mouth daily.  . budesonide -formoterol  (SYMBICORT ) 160-4.5 MCG/ACT inhaler Inhale 2 puffs into the lungs 2 (two) times daily.  . CALCIUM  PO Take by mouth.  . cetirizine  (ZYRTEC ) 10 MG tablet TAKE 1 TABLET BY MOUTH EVERY DAY  . clotrimazole -betamethasone  (LOTRISONE ) cream Apply 1  Application topically 2 (two) times daily.  . hydrOXYzine  (ATARAX ) 50 MG tablet TAKE 1 TABLET BY MOUTH DAILY AS NEEDED FOR ANXIETY OR SLEEP  . ipratropium (ATROVENT ) 0.03 % nasal spray PLACE 2 SPRAYS INTO BOTH NOSTRILS EVERY 12 HOURS USE IN PLACE OF FLONASE   . ipratropium-albuterol  (DUONEB) 0.5-2.5 (3) MG/3ML SOLN Take 3 mLs by nebulization every 4 (four) hours as needed.  . levothyroxine  (SYNTHROID ) 75 MCG tablet Take 1 tablet (75 mcg total) by mouth daily before breakfast.  . meloxicam  (MOBIC ) 15 MG tablet Take 1 tablet by mouth daily.  . Misc Natural Products (TURMERIC, CURCUMIN, PO) Take by mouth.  . montelukast  (SINGULAIR ) 10 MG tablet TAKE 1 TABLET(10 MG) BY MOUTH AT BEDTIME  . nystatin  (MYCOSTATIN /NYSTOP ) powder Apply 1 Application topically 3 (three) times daily.  .  omeprazole  (PRILOSEC) 20 MG capsule TAKE 1 CAPSULE(20 MG) BY MOUTH TWICE DAILY BEFORE A MEAL  . oxybutynin  (DITROPAN -XL) 10 MG 24 hr tablet Take 1 tablet (10 mg total) by mouth daily.  . trimethoprim  (TRIMPEX ) 100 MG tablet Take 1 tablet (100 mg total) by mouth daily.  SABRA CASTLEMAN ON 05/23/2024] venlafaxine  XR (EFFEXOR -XR) 150 MG 24 hr capsule Take 1 capsule (150 mg total) by mouth daily with breakfast.   Current Facility-Administered Medications for the 05/17/24 encounter (Office Visit) with Tamea Dedra CROME, MD  Medication  . albuterol  (PROVENTIL ) (2.5 MG/3ML) 0.083% nebulizer solution 2.5 mg    Immunization History  Administered Date(s) Administered  . Fluad Trivalent(High Dose 65+) 07/20/2023  . Fluzone Influenza virus vaccine,trivalent (IIV3), split virus 10/03/2012  . Influenza, Mdck, Trivalent,PF 6+ MOS(egg free) 09/07/2013  . Influenza, Seasonal, Injecte, Preservative Fre 08/22/2014, 05/14/2021  . Influenza,inj,Quad PF,6+ Mos 10/03/2012, 08/14/2015  . Influenza-Unspecified 10/03/2012, 09/07/2013, 08/22/2014, 08/14/2015  . Moderna Sars-Covid-2 Vaccination 02/17/2020, 03/16/2020  . Pneumococcal Polysaccharide-23 09/09/2011, 10/03/2012, 05/14/2021  . Tdap 09/08/2004, 08/21/2011, 02/22/2021        Objective:     BP 120/76   Pulse 75   Temp 97.9 F (36.6 C) (Temporal)   Ht 5' 2 (1.575 m)   Wt 263 lb 9.6 oz (119.6 kg)   SpO2 94%   BMI 48.21 kg/m   SpO2: 94 %  GENERAL: HEAD: Normocephalic, atraumatic.  EYES: Pupils equal, round, reactive to light.  No scleral icterus.  MOUTH:  NECK: Supple. No thyromegaly. Trachea midline. No JVD.  No adenopathy. PULMONARY: Good air entry bilaterally.  No adventitious sounds. CARDIOVASCULAR: S1 and S2. Regular rate and rhythm.  ABDOMEN: MUSCULOSKELETAL: No joint deformity, no clubbing, no edema.  NEUROLOGIC:  SKIN: Intact,warm,dry. PSYCH:        Assessment & Plan:   No diagnosis found.  No orders of the defined types were  placed in this encounter.   No orders of the defined types were placed in this encounter.    Advised if symptoms do not improve or worsen, to please contact office for sooner follow up or seek emergency care.    I spent xxx minutes of dedicated to the care of this patient on the date of this encounter to include pre-visit review of records, face-to-face time with the patient discussing conditions above, post visit ordering of testing, clinical documentation with the electronic health record, making appropriate referrals as documented, and communicating necessary findings to members of the patients care team.   C. Leita Tamea, MD Advanced Bronchoscopy PCCM [facility]    *This note was dictated using voice recognition software/Dragon.  Despite best efforts to proofread, errors can occur which can  change the meaning. Any transcriptional errors that result from this process are unintentional and may not be fully corrected at the time of dictation.

## 2024-05-19 ENCOUNTER — Encounter: Payer: Self-pay | Admitting: Pulmonary Disease

## 2024-05-20 NOTE — Telephone Encounter (Signed)
 That is actually a common reaction with this vaccine.  Use some warm compresses over the area, can take Tylenol  for pain and if it is itching can use Benadryl over-the-counter.  It should resolve in a few days.  If she develops fever she will have to be seen in urgent care but usually this resolves in a few days.

## 2024-05-23 ENCOUNTER — Ambulatory Visit: Admitting: Oncology

## 2024-05-23 ENCOUNTER — Other Ambulatory Visit: Payer: Self-pay | Admitting: Cardiology

## 2024-05-23 ENCOUNTER — Other Ambulatory Visit

## 2024-05-23 DIAGNOSIS — R079 Chest pain, unspecified: Secondary | ICD-10-CM

## 2024-05-23 DIAGNOSIS — R0602 Shortness of breath: Secondary | ICD-10-CM

## 2024-05-23 NOTE — Progress Notes (Signed)
 Orders only for Cardiac PET stress test.   Dorene Comfort, PA-C

## 2024-05-24 ENCOUNTER — Encounter (HOSPITAL_COMMUNITY): Payer: Self-pay

## 2024-05-26 ENCOUNTER — Ambulatory Visit
Admission: RE | Admit: 2024-05-26 | Discharge: 2024-05-26 | Disposition: A | Discharge status: Home / Self Care | Source: Ambulatory Visit | Attending: Cardiology | Admitting: Cardiology

## 2024-05-26 DIAGNOSIS — Z6841 Body Mass Index (BMI) 40.0 and over, adult: Secondary | ICD-10-CM | POA: Insufficient documentation

## 2024-05-26 DIAGNOSIS — E782 Mixed hyperlipidemia: Secondary | ICD-10-CM | POA: Insufficient documentation

## 2024-05-26 DIAGNOSIS — R0609 Other forms of dyspnea: Secondary | ICD-10-CM | POA: Insufficient documentation

## 2024-05-26 DIAGNOSIS — R0789 Other chest pain: Secondary | ICD-10-CM | POA: Diagnosis present

## 2024-05-26 DIAGNOSIS — K449 Diaphragmatic hernia without obstruction or gangrene: Secondary | ICD-10-CM | POA: Diagnosis not present

## 2024-05-26 MED ORDER — RUBIDIUM RB82 GENERATOR (RUBYFILL)
25.0000 | PACK | Freq: Once | INTRAVENOUS | Status: AC
  Administered 2024-05-26: 24.92 via INTRAVENOUS

## 2024-05-26 MED ORDER — REGADENOSON 0.4 MG/5ML IV SOLN
0.4000 mg | Freq: Once | INTRAVENOUS | Status: AC
  Administered 2024-05-26: 0.4 mg via INTRAVENOUS
  Filled 2024-05-26: qty 5

## 2024-05-26 MED ORDER — REGADENOSON 0.4 MG/5ML IV SOLN
INTRAVENOUS | Status: AC
  Filled 2024-05-26: qty 5

## 2024-05-26 MED ORDER — RUBIDIUM RB82 GENERATOR (RUBYFILL)
25.0000 | PACK | Freq: Once | INTRAVENOUS | Status: AC
  Administered 2024-05-26: 24.94 via INTRAVENOUS

## 2024-05-26 NOTE — Progress Notes (Signed)
 Patient presents for a cardiac PET stress test and tolerated procedure without incident. Patient maintained acceptable vital signs throughout the test and was offered caffeine  after test.  Patient ambulated out of department with a steady gait.

## 2024-05-27 ENCOUNTER — Telehealth (HOSPITAL_BASED_OUTPATIENT_CLINIC_OR_DEPARTMENT_OTHER): Payer: Self-pay

## 2024-05-27 LAB — NM PET CT CARDIAC PERFUSION MULTI W/ABSOLUTE BLOODFLOW
MBFR: 2.25
Nuc Rest EF: 54 %
Nuc Stress EF: 63 %
Peak HR: 86 {beats}/min
Rest HR: 72 {beats}/min
Rest MBF: 0.8 ml/g/min
Rest Nuclear Isotope Dose: 24.9 mCi
SRS: 0
SSS: 1
ST Depression (mm): 0 mm
Stress MBF: 1.8 ml/g/min
Stress Nuclear Isotope Dose: 24.9 mCi
TID: 1.24

## 2024-05-27 NOTE — Telephone Encounter (Signed)
 CMN received for oxygen  signed by provider and faxed confirmation received

## 2024-06-02 ENCOUNTER — Other Ambulatory Visit: Payer: Self-pay

## 2024-06-02 ENCOUNTER — Ambulatory Visit
Admission: RE | Admit: 2024-06-02 | Discharge: 2024-06-02 | Disposition: A | Discharge status: Home / Self Care | Attending: Gastroenterology | Admitting: Gastroenterology

## 2024-06-02 ENCOUNTER — Ambulatory Visit: Admitting: Anesthesiology

## 2024-06-02 ENCOUNTER — Encounter
Admission: RE | Disposition: A | Discharge status: Home / Self Care | Payer: Self-pay | Source: Home / Self Care | Attending: Gastroenterology

## 2024-06-02 ENCOUNTER — Ambulatory Visit: Admitting: Professional Counselor

## 2024-06-02 ENCOUNTER — Encounter: Payer: Self-pay | Admitting: Gastroenterology

## 2024-06-02 DIAGNOSIS — K31A11 Gastric intestinal metaplasia without dysplasia, involving the antrum: Secondary | ICD-10-CM | POA: Insufficient documentation

## 2024-06-02 DIAGNOSIS — K2950 Unspecified chronic gastritis without bleeding: Secondary | ICD-10-CM | POA: Insufficient documentation

## 2024-06-02 DIAGNOSIS — J4489 Other specified chronic obstructive pulmonary disease: Secondary | ICD-10-CM | POA: Diagnosis not present

## 2024-06-02 DIAGNOSIS — K449 Diaphragmatic hernia without obstruction or gangrene: Secondary | ICD-10-CM | POA: Diagnosis not present

## 2024-06-02 DIAGNOSIS — K219 Gastro-esophageal reflux disease without esophagitis: Secondary | ICD-10-CM | POA: Insufficient documentation

## 2024-06-02 DIAGNOSIS — G4730 Sleep apnea, unspecified: Secondary | ICD-10-CM | POA: Insufficient documentation

## 2024-06-02 DIAGNOSIS — E039 Hypothyroidism, unspecified: Secondary | ICD-10-CM | POA: Diagnosis not present

## 2024-06-02 HISTORY — PX: ESOPHAGOGASTRODUODENOSCOPY: SHX5428

## 2024-06-02 SURGERY — EGD (ESOPHAGOGASTRODUODENOSCOPY)
Anesthesia: General

## 2024-06-02 MED ORDER — DEXMEDETOMIDINE HCL IN NACL 80 MCG/20ML IV SOLN
INTRAVENOUS | Status: DC | PRN
  Administered 2024-06-02: 8 ug via INTRAVENOUS
  Administered 2024-06-02: 12 ug via INTRAVENOUS

## 2024-06-02 MED ORDER — PROPOFOL 500 MG/50ML IV EMUL
INTRAVENOUS | Status: DC | PRN
  Administered 2024-06-02: 125 ug/kg/min via INTRAVENOUS

## 2024-06-02 MED ORDER — PROPOFOL 10 MG/ML IV BOLUS
INTRAVENOUS | Status: DC | PRN
  Administered 2024-06-02 (×2): 50 mg via INTRAVENOUS

## 2024-06-02 MED ORDER — GLYCOPYRROLATE 0.2 MG/ML IJ SOLN
INTRAMUSCULAR | Status: AC
  Filled 2024-06-02: qty 1

## 2024-06-02 MED ORDER — SODIUM CHLORIDE 0.9 % IV SOLN: INTRAVENOUS | Status: DC

## 2024-06-02 MED ORDER — LIDOCAINE HCL (CARDIAC) PF 100 MG/5ML IV SOSY
PREFILLED_SYRINGE | INTRAVENOUS | Status: DC | PRN
  Administered 2024-06-02: 60 mg via INTRAVENOUS

## 2024-06-02 MED ORDER — LIDOCAINE HCL (PF) 2 % IJ SOLN
INTRAMUSCULAR | Status: AC
  Filled 2024-06-02: qty 5

## 2024-06-02 NOTE — Anesthesia Postprocedure Evaluation (Signed)
 Anesthesia Post Note  Patient: Sherhonda Gaspar  Procedure(s) Performed: EGD (ESOPHAGOGASTRODUODENOSCOPY)  Patient location during evaluation: Endoscopy Anesthesia Type: General Level of consciousness: awake and alert Pain management: pain level controlled Vital Signs Assessment: post-procedure vital signs reviewed and stable Respiratory status: spontaneous breathing, nonlabored ventilation and respiratory function stable Cardiovascular status: blood pressure returned to baseline and stable Postop Assessment: no apparent nausea or vomiting Anesthetic complications: no   No notable events documented.   Last Vitals:  Vitals:   06/02/24 1131 06/02/24 1141  BP: 106/74 (!) 108/97  Pulse: 90 94  Resp: 14 18  Temp: (!) 36.2 C (!) 36.2 C  SpO2: 93% 94%    Last Pain:  Vitals:   06/02/24 1141  TempSrc:   PainSc: 0-No pain                 Fairy POUR Luke Falero

## 2024-06-02 NOTE — H&P (Signed)
 Rogelia Copping, MD [facility] [address] [place], [place] [address] Phone:[phone] Fax : [phone]  Primary Care Physician:  Donzella Lauraine SAILOR, DO Primary Gastroenterologist:  Dr. Copping  Pre-Procedure History & Physical: HPI:  Faith Padilla is a 73 y.o. female is here for an endoscopy.   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   Diverticulitis    Dry eye syndrome of both eyes 08/13/2021   Dyspnea on exertion 08/13/2021   Family history of problems related to stress 11/15/2021   Frequency of urination and polyuria 06/25/2022   GAD (generalized anxiety disorder) 08/15/2021   Glaucoma    H/O urinary incontinence 06/25/2022   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   High risk medication use 08/02/2018   HLD (hyperlipidemia)    Iron deficiency 12/04/2019   Isolation (social) 12/07/2021   Kidney stones    Microscopic hematuria    Mixed incontinence    Morbid obesity (HCC)    Ocular hypertension, 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   Ocular migraine 08/02/2018   Osteoarthritis    lumbar spine   Osteoarthritis of right knee 08/28/2023   Palpitations 08/13/2021   Plantar fasciitis 06/25/2023   Post covid-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, 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   Postoperative follow-up 03/30/2020   Prediabetes 08/14/2015   Pseudophakia, both eyes 06/26/2016   Pulmonary fibrosis (HCC) 2020   Rectal bleeding 10/17/2021    Shortness of breath dyspnea    Sleep apnea    CPAP   Vaginal yeast infection    Wears dentures    partial upper    Past Surgical History:  Procedure Laterality Date   ABDOMINAL HYSTERECTOMY  09/08/1996   BACK SURGERY  1990/2014   CATARACT EXTRACTION, BILATERAL  09/08/2012   COLONOSCOPY N/A 12/17/2023   Procedure: COLONOSCOPY;  Surgeon: Copping Rogelia, MD;  Location: [facility] ENDOSCOPY;  Service: Endoscopy;  Laterality: N/A;   COLONOSCOPY WITH PROPOFOL  N/A 03/25/2016   Procedure: COLONOSCOPY WITH PROPOFOL ;  Surgeon: Rogelia Copping, MD;  Location: [facility] ENDOSCOPY;  Service: Endoscopy;  Laterality: N/A;   deviated nose septum surgery  09/08/1998   ESOPHAGOGASTRODUODENOSCOPY (EGD) WITH PROPOFOL  N/A 03/25/2016   Procedure: ESOPHAGOGASTRODUODENOSCOPY (EGD) WITH PROPOFOL ;  Surgeon: Rogelia Copping, MD;  Location: [facility] ENDOSCOPY;  Service: Endoscopy;  Laterality: N/A;   EYE SURGERY     JOINT REPLACEMENT     LITHOTRIPSY  09/08/2010   POLYPECTOMY  12/17/2023   Procedure: POLYPECTOMY, INTESTINE;  Surgeon: Copping Rogelia, MD;  Location: [facility] ENDOSCOPY;  Service: Endoscopy;;   TOTAL HIP ARTHROPLASTY Left     Prior to Admission medications   Medication Sig Start Date End Date Taking? Authorizing Provider  atorvastatin  (LIPITOR) 20 MG tablet TAKE 1 TABLET BY MOUTH EVERY DAY 05/04/24  Yes Pardue, Lauraine SAILOR, DO  Biotin 1 MG CAPS Take 1 capsule by mouth daily.   Yes [provider]  budesonide -formoterol  (SYMBICORT ) 160-4.5 MCG/ACT inhaler Inhale 2 puffs into the lungs 2 (two) times  daily. 10/16/23  Yes Pardue, Lauraine SAILOR, DO  CALCIUM  PO Take by mouth.   Yes [provider]  cetirizine  (ZYRTEC ) 10 MG tablet TAKE 1 TABLET BY MOUTH EVERY DAY 03/22/24  Yes Pardue, Sarah N, DO  hydrOXYzine  (ATARAX ) 50 MG tablet TAKE 1 TABLET BY MOUTH DAILY AS NEEDED FOR ANXIETY OR SLEEP   Yes [provider]  levothyroxine  (SYNTHROID ) 75 MCG tablet Take 1 tablet (75 mcg total) by mouth daily before breakfast. 05/07/23   Yes Towana Small, FNP  meloxicam  (MOBIC ) 15 MG tablet Take 1 tablet by mouth daily.   Yes [provider]  Misc Natural Products (TURMERIC, CURCUMIN, PO) Take by mouth.   Yes [provider]  montelukast  (SINGULAIR ) 10 MG tablet TAKE 1 TABLET(10 MG) BY MOUTH AT BEDTIME 03/28/24  Yes Pardue, Sarah N, DO  omeprazole  (PRILOSEC) 20 MG capsule TAKE 1 CAPSULE(20 MG) BY MOUTH TWICE DAILY BEFORE A MEAL 03/22/24  Yes Pardue, Sarah N, DO  oxybutynin  (DITROPAN -XL) 10 MG 24 hr tablet Take 1 tablet (10 mg total) by mouth daily. 04/25/24  Yes MacDiarmid, Glendia, MD  trimethoprim  (TRIMPEX ) 100 MG tablet Take 1 tablet (100 mg total) by mouth daily. 07/27/23  Yes MacDiarmid, Glendia, MD  venlafaxine  XR (EFFEXOR -XR) 150 MG 24 hr capsule Take 1 capsule (150 mg total) by mouth daily with breakfast. 05/23/24 07/22/24 Yes Hisada, Katheren, MD  venlafaxine  XR (EFFEXOR -XR) 75 MG 24 hr capsule Take 1 capsule (75 mg total) by mouth daily with breakfast. Start after completing 37.5 mg daily for one week 04/07/24 06/06/24 Yes Hisada, Katheren, MD  allopurinol (ZYLOPRIM) 100 MG tablet Take 100 mg by mouth daily. Patient not taking: Reported on 05/17/2024    [provider]  clotrimazole -betamethasone  (LOTRISONE ) cream Apply 1 Application topically 2 (two) times daily. 10/30/23   Donzella Lauraine SAILOR, DO  ipratropium (ATROVENT ) 0.03 % nasal spray PLACE 2 SPRAYS INTO BOTH NOSTRILS EVERY 12 HOURS USE IN PLACE OF FLONASE  03/25/24   Pardue, Lauraine SAILOR, DO  ipratropium-albuterol  (DUONEB) 0.5-2.5 (3) MG/3ML SOLN Take 3 mLs by nebulization every 4 (four) hours as needed. 10/16/23   Donzella Lauraine SAILOR, DO  metroNIDAZOLE  (FLAGYL ) 500 MG tablet TAKE 1 TABLET BY MOUTH TWICE DAILY UNTIL ALL TAKEN Patient not taking: Reported on 05/17/2024    [provider]  nystatin  (MYCOSTATIN /NYSTOP ) powder Apply 1 Application topically 3 (three) times daily. 05/07/23   Towana Small, FNP    Allergies as of 05/16/2024 - Review Complete  05/16/2024  Allergen Reaction Noted   Morphine Hives 12/11/2014    Family History  Problem Relation Age of Onset   Depression Mother    Asthma Mother    Heart disease Mother    Diabetes Mother    Cancer Father        lung cancer   COPD Father    Liver cancer Father    Cancer Sister        Brain Cancer   Allergic rhinitis Sister    Asthma Sister    Depression Daughter    Hematuria Daughter    Bladder Cancer Neg Hx     Social History   Socioeconomic History   Marital status: Divorced    Spouse name: Not on file   Number of children: 3   Years of education: Not on file   Highest education level: Some college, no degree  Occupational History   Occupation: retired  Tobacco Use   Smoking status: Never    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)  Smokeless tobacco: Never  Vaping Use   Vaping status: Never Used  Substance and Sexual Activity   Alcohol use: No   Drug use: No   Sexual activity: Never    Birth control/protection: Surgical  Other Topics Concern   Not on file  Social History Narrative   Not on file   Social Drivers of Health   Financial Resource Strain: Low Risk  (04/29/2024)   Received from [facility]   Overall Financial Resource Strain (CARDIA)    Difficulty of Paying Living Expenses: Not hard at all  Food Insecurity: No Food Insecurity (04/29/2024)   Received from [facility]   Hunger Vital Sign    Within the past 12 months, you worried that your food would run out before you got the money to buy more.: Never true    Within the past 12 months, the food you bought just didn't last and you didn't have money to get more.: Never true  Transportation Needs: No Transportation Needs (04/29/2024)   Received from [facility] - Transportation    In the past 12 months, has lack of transportation kept you from medical appointments or from getting medications?: No    Lack of Transportation (Non-Medical):  No  Physical Activity: Inactive (04/20/2024)   Exercise Vital Sign    Days of Exercise per Week: 0 days    Minutes of Exercise per Session: Not on file  Stress: No Stress Concern Present (04/20/2024)   [employer] of Occupational Health - Occupational Stress Questionnaire    Feeling of Stress: Not at all  Social Connections: Socially Isolated (04/20/2024)   Social Connection and Isolation Panel    Frequency of Communication with Friends and Family: More than three times a week    Frequency of Social Gatherings with Friends and Family: More than three times a week    Attends Religious Services: Never    Database administrator or Organizations: No    Attends Engineer, structural: Not on file    Marital Status: Divorced  Intimate Partner Violence: Not At Risk (12/21/2023)   Humiliation, Afraid, Rape, and Kick questionnaire    Fear of Current or Ex-Partner: No    Emotionally Abused: No    Physically Abused: No    Sexually Abused: No    Review of Systems: See HPI, otherwise negative ROS  Physical Exam: BP 121/66   Pulse 77   Temp (!) 97.5 F (36.4 C)   Wt 116.6 kg   SpO2 95%   BMI 47.01 kg/m  General:   Alert,  pleasant and cooperative in NAD Head:  Normocephalic and atraumatic. Neck:  Supple; no masses or thyromegaly. Lungs:  Clear throughout to auscultation.    Heart:  Regular rate and rhythm. Abdomen:  Soft, nontender and nondistended. Normal bowel sounds, without guarding, and without rebound.   Neurologic:  Alert and  oriented x4;  grossly normal neurologically.  Impression/Plan: Faith Padilla is here for an endoscopy to be performed for GERD  Risks, benefits, limitations, and alternatives regarding  endoscopy have been reviewed with the patient.  Questions have been answered.  All parties agreeable.   Rogelia Copping, MD  06/02/2024, 10:46 AM

## 2024-06-02 NOTE — Op Note (Signed)
 [facility] Patient Name: Faith Padilla Procedure Date: 06/02/2024 11:14 AM MRN: [id number] Account #: [id number] Date of Birth: 05/10/1951 Admit Type: Outpatient Age: 73 Room: [facility] ENDO ROOM 3 Gender: Female Note Status: Finalized Instrument Name: [facility] GI Scope [id number] Procedure:             Upper GI endoscopy Indications:           Heartburn Providers:             Rogelia Copping MD, MD Medicines:             Propofol  per Anesthesia Complications:         No immediate complications. Procedure:             Pre-Anesthesia Assessment:                        - Prior to the procedure, a History and Physical was                         performed, and patient medications and allergies were                         reviewed. The patient's tolerance of previous                         anesthesia was also reviewed. The risks and benefits                         of the procedure and the sedation options and risks                         were discussed with the patient. All questions were                         answered, and informed consent was obtained. Prior                         Anticoagulants: The patient has taken no anticoagulant                         or antiplatelet agents. ASA Grade Assessment: II - A                         patient with mild systemic disease. After reviewing                         the risks and benefits, the patient was deemed in                         satisfactory condition to undergo the procedure.                        After obtaining informed consent, the endoscope was                         passed under direct vision. Throughout the procedure,                         the patient's blood  pressure, pulse, and oxygen                         saturations were monitored continuously. The Endoscope                         was introduced through the mouth, and advanced to the                         second part of duodenum. The  upper GI endoscopy was                         accomplished without difficulty. The patient tolerated                         the procedure well. Findings:      A large hiatal hernia was present.      White nummular lesions were noted in the gastric antrum. Biopsies were       taken with a cold forceps for histology.      The examined duodenum was normal. Impression:            - Large hiatal hernia.                        - White nummular lesions in gastric mucosa. Biopsied.                        - Normal examined duodenum. Recommendation:        - Discharge patient to home.                        - Resume previous diet.                        - Continue present medications.                        - Await pathology results. Procedure Code(s):     --- Professional ---                        [phone], Esophagogastroduodenoscopy, flexible,                         transoral; with biopsy, single or multiple Diagnosis Code(s):     --- Professional ---                        R12, Heartburn                        K31.89, Other diseases of stomach and duodenum CPT copyright 2022 American Medical Association. All rights reserved. The codes documented in this report are preliminary and upon coder review may  be revised to meet current compliance requirements. Rogelia Copping MD, MD 06/02/2024 11:35:39 AM This report has been signed electronically. Number of Addenda: 0 Note Initiated On: 06/02/2024 11:14 AM Estimated Blood Loss:  Estimated blood loss: none.      [facility]

## 2024-06-02 NOTE — Anesthesia Preprocedure Evaluation (Signed)
 Anesthesia Evaluation  Patient identified by MRN, date of birth, ID band Patient awake    Reviewed: Allergy  & Precautions, NPO status , Patient's Chart, lab work & pertinent test results  History of Anesthesia Complications (+) PROLONGED EMERGENCE and history of anesthetic complications  Airway Mallampati: III  TM Distance: <3 FB Neck ROM: full    Dental  (+) Chipped, Poor Dentition, Missing   Pulmonary shortness of breath, asthma , sleep apnea , pneumonia, COPD   + rhonchi  + decreased breath sounds      Cardiovascular negative cardio ROS Normal cardiovascular exam     Neuro/Psych  Headaches  Neuromuscular disease  negative psych ROS   GI/Hepatic Neg liver ROS, hiatal hernia,GERD  Controlled,,  Endo/Other  Hypothyroidism    Renal/GU Renal disease  negative genitourinary   Musculoskeletal   Abdominal   Peds  Hematology negative hematology ROS (+)   Anesthesia Other Findings Past Medical History: No date: Abdominal pain, right upper quadrant 03/12/2016: Abdominal pain, RUQ (right upper quadrant) 06/25/2023: Achilles tendinitis of right lower extremity No date: Acid reflux 06/29/2017: Allergic conjunctivitis of both eyes 12/04/2019: Anemia No date: Anxiety and depression No date: Asthma 08/13/2021: 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) No date: Complication of anesthesia     Comment:  slow to awaken, wakes coughing and SOB No date: COPD (chronic obstructive pulmonary disease) (HCC) No date: Depression 08/15/2021: 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 No date: Diverticulitis 08/13/2021: Dry eye syndrome of both eyes 08/13/2021: Dyspnea on exertion 11/15/2021: Family history of problems related to stress 06/25/2022: Frequency of urination and polyuria 08/15/2021: GAD (generalized anxiety disorder) No date: Glaucoma 06/25/2022: H/O urinary  incontinence 06/25/2023: Hereditary peripheral neuropathy 08/02/2018: High risk medication use No date: HLD (hyperlipidemia) 12/04/2019: Iron deficiency 12/07/2021: Isolation (social) No date: Kidney stones No date: Microscopic hematuria No date: Mixed incontinence No date: Morbid obesity (HCC) 06/26/2016: Ocular hypertension, bilateral 08/02/2018: Ocular migraine No date: Osteoarthritis     Comment:  lumbar spine 08/28/2023: Osteoarthritis of right knee 08/13/2021: Palpitations 06/25/2023: Plantar fasciitis 12/07/2021: Post covid-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, unspecified 03/30/2020: Postoperative follow-up 08/14/2015: Prediabetes 06/26/2016: Pseudophakia, both eyes 2020: Pulmonary fibrosis (HCC) 10/17/2021: Rectal bleeding No date: Shortness of breath dyspnea No date: Sleep apnea     Comment:  CPAP No date: Vaginal yeast infection No date: Wears dentures     Comment:  partial upper  Past Surgical History: 09/08/1996: ABDOMINAL HYSTERECTOMY 1990/2014: BACK SURGERY 09/08/2012: CATARACT EXTRACTION, BILATERAL 12/17/2023: COLONOSCOPY; N/A     Comment:  Procedure: COLONOSCOPY;  Surgeon: Jinny Carmine, MD;                Location: [facility] ENDOSCOPY;  Service: Endoscopy;                Laterality: N/A; 03/25/2016: COLONOSCOPY WITH PROPOFOL ; N/A     Comment:  Procedure: COLONOSCOPY WITH PROPOFOL ;  Surgeon: Carmine Jinny, MD;  Location: [facility] ENDOSCOPY;  Service: Endoscopy;              Laterality: N/A; 09/08/1998: deviated nose septum surgery 03/25/2016: ESOPHAGOGASTRODUODENOSCOPY (EGD) WITH PROPOFOL ; N/A     Comment:  Procedure: ESOPHAGOGASTRODUODENOSCOPY (EGD) WITH               PROPOFOL ;  Surgeon: Carmine Jinny, MD;  Location: [facility]               ENDOSCOPY;  Service: Endoscopy;  Laterality:  N/A; No date: EYE SURGERY No date: JOINT REPLACEMENT 09/08/2010: LITHOTRIPSY 12/17/2023: POLYPECTOMY     Comment:  Procedure: POLYPECTOMY, INTESTINE;  Surgeon: Jinny Carmine, MD;   Location: [facility] ENDOSCOPY;  Service:               Endoscopy;; No date: TOTAL HIP ARTHROPLASTY; Left  BMI    Body Mass Index: 47.01 kg/m      Reproductive/Obstetrics negative OB ROS                              Anesthesia Physical Anesthesia Plan  ASA: 3  Anesthesia Plan: General   Post-op Pain Management:    Induction: Intravenous  PONV Risk Score and Plan: Propofol  infusion and TIVA  Airway Management Planned: Natural Airway and Nasal Cannula  Additional Equipment:   Intra-op Plan:   Post-operative Plan:   Informed Consent: I have reviewed the patients History and Physical, chart, labs and discussed the procedure including the risks, benefits and alternatives for the proposed anesthesia with the patient or authorized representative who has indicated his/her understanding and acceptance.     Dental Advisory Given  Plan Discussed with: Anesthesiologist, CRNA and Surgeon  Anesthesia Plan Comments: (Patient consented for risks of anesthesia including but not limited to:  - adverse reactions to medications - risk of airway placement if required - damage to eyes, teeth, lips or other oral mucosa - nerve damage due to positioning  - sore throat or hoarseness - Damage to heart, brain, nerves, lungs, other parts of body or loss of life  Patient voiced understanding and assent.)        Anesthesia Quick Evaluation

## 2024-06-02 NOTE — Transfer of Care (Signed)
 Immediate Anesthesia Transfer of Care Note  Patient: Faith Padilla  Procedure(s) Performed: EGD (ESOPHAGOGASTRODUODENOSCOPY)  Patient Location: PACU  Anesthesia Type:General  Level of Consciousness: sedated  Airway & Oxygen Therapy: Patient Spontanous Breathing  Post-op Assessment: Report given to RN  Post vital signs: Reviewed and stable  Last Vitals:  Vitals Value Taken Time  BP    Temp    Pulse 90 06/02/24 11:33  Resp 22 06/02/24 11:33  SpO2 94 % 06/02/24 11:33  Vitals shown include unfiled device data.  Last Pain:  Vitals:   06/02/24 1043  PainSc: 0-No pain         Complications: No notable events documented.

## 2024-06-06 LAB — SURGICAL PATHOLOGY

## 2024-06-09 ENCOUNTER — Encounter: Payer: Self-pay | Admitting: Family Medicine

## 2024-06-09 ENCOUNTER — Ambulatory Visit: Admitting: Family Medicine

## 2024-06-09 VITALS — BP 108/77 | HR 77 | Resp 16 | Ht 62.0 in | Wt 260.4 lb

## 2024-06-09 DIAGNOSIS — J8410 Pulmonary fibrosis, unspecified: Secondary | ICD-10-CM

## 2024-06-09 DIAGNOSIS — Z23 Encounter for immunization: Secondary | ICD-10-CM | POA: Diagnosis not present

## 2024-06-09 DIAGNOSIS — Z01818 Encounter for other preprocedural examination: Secondary | ICD-10-CM | POA: Diagnosis not present

## 2024-06-09 DIAGNOSIS — Z9981 Dependence on supplemental oxygen: Secondary | ICD-10-CM

## 2024-06-09 DIAGNOSIS — N182 Chronic kidney disease, stage 2 (mild): Secondary | ICD-10-CM

## 2024-06-09 DIAGNOSIS — E785 Hyperlipidemia, unspecified: Secondary | ICD-10-CM

## 2024-06-09 DIAGNOSIS — L853 Xerosis cutis: Secondary | ICD-10-CM

## 2024-06-09 DIAGNOSIS — K219 Gastro-esophageal reflux disease without esophagitis: Secondary | ICD-10-CM

## 2024-06-09 DIAGNOSIS — J9611 Chronic respiratory failure with hypoxia: Secondary | ICD-10-CM

## 2024-06-09 DIAGNOSIS — G3184 Mild cognitive impairment, so stated: Secondary | ICD-10-CM

## 2024-06-09 DIAGNOSIS — K449 Diaphragmatic hernia without obstruction or gangrene: Secondary | ICD-10-CM

## 2024-06-09 DIAGNOSIS — R7989 Other specified abnormal findings of blood chemistry: Secondary | ICD-10-CM

## 2024-06-09 DIAGNOSIS — G4733 Obstructive sleep apnea (adult) (pediatric): Secondary | ICD-10-CM

## 2024-06-09 DIAGNOSIS — E039 Hypothyroidism, unspecified: Secondary | ICD-10-CM

## 2024-06-09 DIAGNOSIS — R7309 Other abnormal glucose: Secondary | ICD-10-CM

## 2024-06-09 MED ORDER — LEVOTHYROXINE SODIUM 75 MCG PO TABS: 75.0000 ug | ORAL_TABLET | Freq: Every day | ORAL | 3 refills | Status: AC

## 2024-06-09 MED ORDER — TIRZEPATIDE-WEIGHT MANAGEMENT 2.5 MG/0.5ML ~~LOC~~ SOLN: 2.5000 mg | SUBCUTANEOUS | 0 refills | Status: DC

## 2024-06-09 NOTE — Progress Notes (Signed)
 Established patient visit   Patient: Faith Padilla   DOB: Feb 13, 1951   73 y.o. Female  MRN: [id number] Visit Date: 06/09/2024  Today's healthcare provider: LAURAINE LOISE BUOY, DO   Chief Complaint  Patient presents with   Pre-op Exam    Patient has appointment today with Cardio   Subjective    HPI Faith Padilla is a 73 year old female with pulmonary fibrosis who presents with memory issues and concerns about her upcoming surgery.  She has been experiencing significant memory issues, including difficulty finishing sentences and forgetting the purpose of appointments. She relies heavily on her planner to manage her schedule and has trouble concentrating while reading, often needing to reread sentences. These memory issues have worsened since August, following a bout of pneumonia. No history of stroke or TIA is reported.  She is awaiting surgery for a large hiatal hernia. She reports being told that the hernia is large and that it is pressing on her lungs. The surgery is not yet scheduled, pending a CT scan and a barium swallow. She reports that her surgeon recommended she lose weight before and after the surgery.  She has a history of pulmonary fibrosis, contributing to breathing difficulties and limiting physical activity. She experiences lightheadedness upon standing and uses home oxygen when her levels drop significantly. She has not checked her oxygen levels in the past week and notes that using oxygen helps, although she does not believe it alleviates her lightheadedness when standing.  She is currently taking levothyroxine  for hypothyroidism, with only three tablets remaining. She has made dietary changes to aid in weight loss, such as cutting out ice cream and candy, and reducing soda intake. She uses a microwave, crock pot, and air fryer for cooking due to a malfunctioning stove burner.  She experiences dry patches on her face and shoulders, with itching in her ears, possibly due  to lack of ear wax. She does not use lotion on her face but washes it every morning. She has not been taking iron supplements regularly due to constipation, although her vitamins contain iron.      Medications: Outpatient Medications Prior to Visit  Medication Sig Note   atorvastatin  (LIPITOR) 20 MG tablet TAKE 1 TABLET BY MOUTH EVERY DAY    Biotin 1 MG CAPS Take 1 capsule by mouth daily.    budesonide -formoterol  (SYMBICORT ) 160-4.5 MCG/ACT inhaler Inhale 2 puffs into the lungs 2 (two) times daily.    CALCIUM  PO Take by mouth.    clotrimazole -betamethasone  (LOTRISONE ) cream Apply 1 Application topically 2 (two) times daily.    hydrOXYzine  (ATARAX ) 50 MG tablet TAKE 1 TABLET BY MOUTH DAILY AS NEEDED FOR ANXIETY OR SLEEP    ipratropium (ATROVENT ) 0.03 % nasal spray PLACE 2 SPRAYS INTO BOTH NOSTRILS EVERY 12 HOURS USE IN PLACE OF FLONASE     ipratropium-albuterol  (DUONEB) 0.5-2.5 (3) MG/3ML SOLN Take 3 mLs by nebulization every 4 (four) hours as needed.    meloxicam  (MOBIC ) 15 MG tablet Take 1 tablet by mouth daily.    Misc Natural Products (TURMERIC, CURCUMIN, PO) Take by mouth.    montelukast  (SINGULAIR ) 10 MG tablet TAKE 1 TABLET(10 MG) BY MOUTH AT BEDTIME    nystatin  (MYCOSTATIN /NYSTOP ) powder Apply 1 Application topically 3 (three) times daily.    oxybutynin  (DITROPAN -XL) 10 MG 24 hr tablet Take 1 tablet (10 mg total) by mouth daily.    trimethoprim  (TRIMPEX ) 100 MG tablet Take 1 tablet (100 mg total) by mouth daily.  venlafaxine  XR (EFFEXOR -XR) 150 MG 24 hr capsule Take 1 capsule (150 mg total) by mouth daily with breakfast.    [DISCONTINUED] cetirizine  (ZYRTEC ) 10 MG tablet TAKE 1 TABLET BY MOUTH EVERY DAY    [DISCONTINUED] levothyroxine  (SYNTHROID ) 75 MCG tablet Take 1 tablet (75 mcg total) by mouth daily before breakfast.    [DISCONTINUED] omeprazole  (PRILOSEC) 20 MG capsule TAKE 1 CAPSULE(20 MG) BY MOUTH TWICE DAILY BEFORE A MEAL    [DISCONTINUED] allopurinol (ZYLOPRIM) 100 MG  tablet Take 100 mg by mouth daily. (Patient not taking: Reported on 05/17/2024)    [DISCONTINUED] metroNIDAZOLE  (FLAGYL ) 500 MG tablet TAKE 1 TABLET BY MOUTH TWICE DAILY UNTIL ALL TAKEN (Patient not taking: Reported on 05/17/2024)    [DISCONTINUED] venlafaxine  XR (EFFEXOR -XR) 75 MG 24 hr capsule Take 1 capsule (75 mg total) by mouth daily with breakfast. Start after completing 37.5 mg daily for one week 06/09/2024: by Dr. Hisada   Facility-Administered Medications Prior to Visit  Medication Dose Route Frequency Provider   albuterol  (PROVENTIL ) (2.5 MG/3ML) 0.083% nebulizer solution 2.5 mg  2.5 mg Nebulization Q4H Cobb, Comer GAILS, NP        Objective    BP 108/77 (BP Location: Left Arm, Patient Position: Sitting, Cuff Size: Large)   Pulse 77   Resp 16   Ht 5' 2 (1.575 m)   Wt 260 lb 6.4 oz (118.1 kg)   SpO2 96%   BMI 47.63 kg/m     Physical Exam Constitutional:      Appearance: Normal appearance. She is morbidly obese.  HENT:     Head: Normocephalic and atraumatic.  Eyes:     General: No scleral icterus.    Extraocular Movements: Extraocular movements intact.     Conjunctiva/sclera: Conjunctivae normal.  Cardiovascular:     Rate and Rhythm: Normal rate and regular rhythm.     Pulses: Normal pulses.     Heart sounds: Normal heart sounds.  Pulmonary:     Effort: Pulmonary effort is normal. No respiratory distress.     Breath sounds: Normal breath sounds.  Musculoskeletal:     Right lower leg: No edema.     Left lower leg: No edema.     Comments: Requires cane to ambulate  Skin:    General: Skin is warm and dry.  Neurological:     Mental Status: She is alert and oriented to person, place, and time. Mental status is at baseline.  Psychiatric:        Mood and Affect: Mood normal.        Behavior: Behavior normal.      Results for orders placed or performed in visit on 06/09/24  Microalbumin / creatinine urine ratio  Result Value Ref Range   Creatinine, Urine 170.0 Not  Estab. mg/dL   Microalbumin, Urine 88.9 Not Estab. ug/mL   Microalb/Creat Ratio 6 0 - 29 mg/g creat  Comprehensive metabolic panel with GFR  Result Value Ref Range   Glucose 100 (H) 70 - 99 mg/dL   BUN 22 8 - 27 mg/dL   Creatinine, Ser 8.84 (H) 0.57 - 1.00 mg/dL   eGFR 50 (L) >40 fO/fpw/8.26   BUN/Creatinine Ratio 19 12 - 28   Sodium 142 134 - 144 mmol/L   Potassium 5.1 3.5 - 5.2 mmol/L   Chloride 103 96 - 106 mmol/L   CO2 22 20 - 29 mmol/L   Calcium  9.8 8.7 - 10.3 mg/dL   Total Protein 7.1 6.0 - 8.5 g/dL   Albumin 4.5  3.8 - 4.8 g/dL   Globulin, Total 2.6 1.5 - 4.5 g/dL   Bilirubin Total 0.4 0.0 - 1.2 mg/dL   Alkaline Phosphatase 88 49 - 135 IU/L   AST 20 0 - 40 IU/L   ALT 14 0 - 32 IU/L  Hemoglobin A1c  Result Value Ref Range   Hgb A1c MFr Bld 5.8 (H) 4.8 - 5.6 %   Est. average glucose Bld gHb Est-mCnc 120 mg/dL  Lipid panel  Result Value Ref Range   Cholesterol, Total 160 100 - 199 mg/dL   Triglycerides 837 (H) 0 - 149 mg/dL   HDL 48 >60 mg/dL   VLDL Cholesterol Cal 28 5 - 40 mg/dL   LDL Chol Calc (NIH) 84 0 - 99 mg/dL   Chol/HDL Ratio 3.3 0.0 - 4.4 ratio  TSH Rfx on Abnormal to Free T4  Result Value Ref Range   TSH 1.800 0.450 - 4.500 uIU/mL  Vitamin B12  Result Value Ref Range   Vitamin B-12 1,052 232 - 1,245 pg/mL    Assessment & Plan    Preoperative clearance  Hiatal hernia with gastroesophageal reflux  Mild cognitive impairment  Pulmonary fibrosis (HCC)  Chronic hypoxemic respiratory failure (HCC)  Dependence on supplemental oxygen  Obstructive sleep apnea on CPAP -     Tirzepatide -Weight Management; Inject 2.5 mg into the skin once a week.  Dispense: 2 mL; Refill: 0  Morbid obesity with BMI of 45.0-49.9, adult (HCC)  Chronic kidney disease, stage 2, mildly decreased GFR -     Microalbumin / creatinine urine ratio -     Comprehensive metabolic panel with GFR  Hypothyroidism, unspecified type -     TSH Rfx on Abnormal to Free T4 -      Levothyroxine  Sodium; Take 1 tablet (75 mcg total) by mouth daily before breakfast.  Dispense: 90 tablet; Refill: 3  Dry skin dermatitis  Elevated hemoglobin A1c -     Hemoglobin A1c  Elevated vitamin B12 level -     Vitamin B12  Hyperlipidemia, unspecified hyperlipidemia type -     Lipid panel  Immunization due -     Flu vaccine HIGH DOSE PF(Fluzone Trivalent)     Preoperative clearance; hiatal hernia with gastroesophageal reflux Large hernia causing pulmonary pressure. Awaiting imaging for surgical planning.  Following up with cardiology after today's visit for further evaluation. - Planned CT and barium swallow with gastroenterology. - Revised Cardiac risk Index: 1 points, indicating a 1.1% risk of major cardiac event.  Mild cognitive impairment Significant memory issues worsened post-pneumonia. No stroke or TIA history. - Schedule full memory assessment at follow-up visit.  Pulmonary fibrosis; chronic hypoxemic respiratory failure; dependence on supplemental oxygen Dyspnea and low energy.  Has oxygen at home but does not use it consistently and endorses ongoing persistent fatigue. - Encourage use of oxygen as recommended to alleviate symptoms. - Monitor oxygen levels regularly.  Obstructive sleep apnea on CPAP May allow Zepbound  coverage for improvement in sleep, increased energy and weight loss. - Prescribed Zepbound  as noted.  Morbid obesity with BMI of 45.0-49.9 Advised weight loss. Dietary changes made. Physical activity limited by low energy and breathing difficulties in the setting of pulmonary fibrosis with chronic respiratory failure.  - Advise dietary modifications and consider chair exercises.  Chronic kidney disease, stage 2, mildly decreased GFR Continue to optimize risk factors.  Continue to monitor.  Hypothyroidism, unspecified type Running low on levothyroxine . No recent thyroid  function tests. - Order thyroid  function tests. - Refill levothyroxine   prescription.  Dry skin dermatitis Dry patches on face, shoulders, and ears. Itching in ears possibly due to lack of ear wax. - Recommend applying light lotion such as generic Aveeno, Aquaphor, or Eucerin. - Advise using mineral oil drops in ears occasionally.    Return in about 4 weeks (around 07/07/2024) for MME/memory assessment.      I discussed the assessment and treatment plan with the patient  The patient was provided an opportunity to ask questions and all were answered. The patient agreed with the plan and demonstrated an understanding of the instructions.   The patient was advised to call back or seek an in-person evaluation if the symptoms worsen or if the condition fails to improve as anticipated.    LAURAINE LOISE BUOY, DO  [facility] [phone] (phone) [phone] (fax)  [facility]

## 2024-06-09 NOTE — Patient Instructions (Addendum)
 Recommended vaccines to get at the pharmacy: Covid booster and shingles vaccine (shingrix).  Please call the [facility] [phone]) to schedule a routine screening bone density test.

## 2024-06-10 ENCOUNTER — Other Ambulatory Visit (HOSPITAL_COMMUNITY): Payer: Self-pay

## 2024-06-10 ENCOUNTER — Ambulatory Visit
Admission: RE | Admit: 2024-06-10 | Discharge: 2024-06-10 | Disposition: A | Discharge status: Home / Self Care | Source: Ambulatory Visit | Attending: Surgery | Admitting: Surgery

## 2024-06-10 ENCOUNTER — Telehealth: Payer: Self-pay

## 2024-06-10 DIAGNOSIS — K449 Diaphragmatic hernia without obstruction or gangrene: Secondary | ICD-10-CM | POA: Diagnosis present

## 2024-06-10 DIAGNOSIS — K219 Gastro-esophageal reflux disease without esophagitis: Secondary | ICD-10-CM

## 2024-06-10 LAB — COMPREHENSIVE METABOLIC PANEL WITH GFR
ALT: 14 IU/L (ref 0–32)
AST: 20 IU/L (ref 0–40)
Albumin: 4.5 g/dL (ref 3.8–4.8)
Alkaline Phosphatase: 88 IU/L (ref 49–135)
BUN/Creatinine Ratio: 19 (ref 12–28)
BUN: 22 mg/dL (ref 8–27)
Bilirubin Total: 0.4 mg/dL (ref 0.0–1.2)
CO2: 22 mmol/L (ref 20–29)
Calcium: 9.8 mg/dL (ref 8.7–10.3)
Chloride: 103 mmol/L (ref 96–106)
Creatinine, Ser: 1.15 mg/dL — ABNORMAL HIGH (ref 0.57–1.00)
Globulin, Total: 2.6 g/dL (ref 1.5–4.5)
Glucose: 100 mg/dL — ABNORMAL HIGH (ref 70–99)
Potassium: 5.1 mmol/L (ref 3.5–5.2)
Sodium: 142 mmol/L (ref 134–144)
Total Protein: 7.1 g/dL (ref 6.0–8.5)
eGFR: 50 mL/min/1.73 — ABNORMAL LOW (ref >59)

## 2024-06-10 LAB — MICROALBUMIN / CREATININE URINE RATIO
Creatinine, Urine: 170 mg/dL
Microalb/Creat Ratio: 6 mg/g{creat} (ref 0–29)
Microalbumin, Urine: 11 ug/mL

## 2024-06-10 LAB — LIPID PANEL
Chol/HDL Ratio: 3.3 ratio (ref 0.0–4.4)
Cholesterol, Total: 160 mg/dL (ref 100–199)
HDL: 48 mg/dL (ref >39)
LDL Chol Calc (NIH): 84 mg/dL (ref 0–99)
Triglycerides: 162 mg/dL — ABNORMAL HIGH (ref 0–149)
VLDL Cholesterol Cal: 28 mg/dL (ref 5–40)

## 2024-06-10 LAB — HEMOGLOBIN A1C
Est. average glucose Bld gHb Est-mCnc: 120 mg/dL
Hgb A1c MFr Bld: 5.8 % — ABNORMAL HIGH (ref 4.8–5.6)

## 2024-06-10 LAB — TSH RFX ON ABNORMAL TO FREE T4: TSH: 1.8 u[IU]/mL (ref 0.450–4.500)

## 2024-06-10 LAB — VITAMIN B12: Vitamin B-12: 1052 pg/mL (ref 232–1245)

## 2024-06-10 MED ORDER — IOHEXOL 300 MG/ML  SOLN
100.0000 mL | Freq: Once | INTRAMUSCULAR | Status: AC | PRN
  Administered 2024-06-10: 100 mL via INTRAVENOUS

## 2024-06-10 NOTE — Telephone Encounter (Signed)
 Pharmacy Patient Advocate Encounter   Received notification from Onbase that prior authorization for Zepbound  is required/requested.   Insurance verification completed.   The patient is insured through [employer].   Per test claim: Medicare Part D covers Zepbound  for moderate to severe sleep apnea, as defined as AHI over 15, looking at pt's sleep study, her AHI is 9.9 and therefore doesn't qualify for coverage.

## 2024-06-11 ENCOUNTER — Ambulatory Visit: Payer: Self-pay | Admitting: Gastroenterology

## 2024-06-13 ENCOUNTER — Other Ambulatory Visit: Payer: Self-pay | Admitting: Psychiatry

## 2024-06-14 ENCOUNTER — Encounter: Payer: Self-pay | Admitting: Gastroenterology

## 2024-06-14 ENCOUNTER — Ambulatory Visit: Payer: Self-pay | Admitting: Family Medicine

## 2024-06-15 ENCOUNTER — Ambulatory Visit: Payer: Self-pay | Admitting: *Deleted

## 2024-06-15 NOTE — Telephone Encounter (Signed)
 Left message letting patient know if her CT results per Dr Jordis- patient verbally understands.

## 2024-06-20 ENCOUNTER — Other Ambulatory Visit: Payer: Self-pay | Admitting: Family Medicine

## 2024-06-20 DIAGNOSIS — K219 Gastro-esophageal reflux disease without esophagitis: Secondary | ICD-10-CM

## 2024-06-22 ENCOUNTER — Telehealth: Payer: Self-pay | Admitting: Family Medicine

## 2024-06-22 ENCOUNTER — Ambulatory Visit (INDEPENDENT_AMBULATORY_CARE_PROVIDER_SITE_OTHER): Admitting: Surgery

## 2024-06-22 ENCOUNTER — Encounter: Payer: Self-pay | Admitting: Surgery

## 2024-06-22 VITALS — BP 109/68 | HR 103 | Temp 98.3°F | Ht 62.0 in | Wt 259.4 lb

## 2024-06-22 DIAGNOSIS — K449 Diaphragmatic hernia without obstruction or gangrene: Secondary | ICD-10-CM | POA: Diagnosis not present

## 2024-06-22 DIAGNOSIS — R0789 Other chest pain: Secondary | ICD-10-CM | POA: Diagnosis not present

## 2024-06-22 DIAGNOSIS — K219 Gastro-esophageal reflux disease without esophagitis: Secondary | ICD-10-CM

## 2024-06-22 NOTE — Progress Notes (Signed)
 Outpatient Surgical Follow Up  06/22/2024  Faith Padilla is an 73 y.o. female.   Chief Complaint  Patient presents with   Follow-up    Hiatal hernia    HPI: Faith Padilla is a 73 y.o. female in consultation at the request of Dr. Nicholaus She did have recent visit due to shortness of breath and chest pain.  This was atypical chest pain.  She underwent a CT angio of her chest that I have personally reviewed showing no evidence of PE but a large type III paraesophageal hernia. Reports that the pain was atypical is intermittent she does have some associated shortness of breath.  She does have a history of pulmonary fibrosis COPD.  She is not on oxygen Did have significant cardiac workup in the emergency room showing no evidence of troponin leak. Uses a cane.  She did well with hip replacement last year BC and BMP was normal.  Does have some intermittent reflux and some mild dysphagia for pills. 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 Her BMI remained 47. She is talking to PCP and zepbound  has been prescribed, she in in the process of appealing approval by insurance.She does have sleep apnea so in theory there is an indication Prior abdominal  hysterectomy  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   Diverticulitis    Dry eye syndrome of both eyes 08/13/2021    Dyspnea on exertion 08/13/2021   Family history of problems related to stress 11/15/2021   Frequency of urination and polyuria 06/25/2022   GAD (generalized anxiety disorder) 08/15/2021   Glaucoma    H/O urinary incontinence 06/25/2022   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   High risk medication use 08/02/2018   HLD (hyperlipidemia)    Iron deficiency 12/04/2019   Isolation (social) 12/07/2021   Kidney stones    Microscopic hematuria    Mixed incontinence    Morbid obesity (HCC)    Ocular hypertension, 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   Ocular migraine 08/02/2018   Osteoarthritis    lumbar spine   Osteoarthritis of right knee 08/28/2023   Palpitations 08/13/2021   Plantar fasciitis 06/25/2023   Post covid-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, 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   Postoperative follow-up 03/30/2020   Prediabetes 08/14/2015   Pseudophakia, both eyes 06/26/2016   Pulmonary fibrosis (HCC) 2020   Rectal bleeding 10/17/2021   Shortness of breath dyspnea    Sleep apnea    CPAP   Vaginal yeast infection    Wears dentures    partial upper    Past Surgical History:  Procedure Laterality Date   ABDOMINAL HYSTERECTOMY  09/08/1996   BACK SURGERY  1990/2014   CATARACT EXTRACTION, BILATERAL  09/08/2012   COLONOSCOPY N/A 12/17/2023   Procedure: COLONOSCOPY;  Surgeon: Jinny Carmine, MD;  Location: [facility] ENDOSCOPY;  Service: Endoscopy;  Laterality: N/A;   COLONOSCOPY WITH PROPOFOL  N/A 03/25/2016  Procedure: COLONOSCOPY WITH PROPOFOL ;  Surgeon: Rogelia Copping, MD;  Location: [facility] ENDOSCOPY;  Service: Endoscopy;  Laterality: N/A;   deviated nose septum surgery  09/08/1998   ESOPHAGOGASTRODUODENOSCOPY N/A 06/02/2024   Procedure: EGD (ESOPHAGOGASTRODUODENOSCOPY);  Surgeon: Copping Rogelia, MD;  Location: [facility] ENDOSCOPY;  Service: Endoscopy;  Laterality: N/A;   ESOPHAGOGASTRODUODENOSCOPY (EGD) WITH PROPOFOL  N/A 03/25/2016   Procedure: ESOPHAGOGASTRODUODENOSCOPY (EGD) WITH PROPOFOL ;  Surgeon: Rogelia Copping, MD;  Location:  [facility] ENDOSCOPY;  Service: Endoscopy;  Laterality: N/A;   EYE SURGERY     JOINT REPLACEMENT     LITHOTRIPSY  09/08/2010   POLYPECTOMY  12/17/2023   Procedure: POLYPECTOMY, INTESTINE;  Surgeon: Copping Rogelia, MD;  Location: [facility] ENDOSCOPY;  Service: Endoscopy;;   TOTAL HIP ARTHROPLASTY Left     Family History  Problem Relation Age of Onset   Depression Mother    Asthma Mother    Heart disease Mother    Diabetes Mother    Cancer Father        lung cancer   COPD Father    Liver cancer Father    Cancer Sister        Brain Cancer   Allergic rhinitis Sister    Asthma Sister    Depression Daughter    Hematuria Daughter    Bladder Cancer Neg Hx     Social History:  reports that she has never smoked. She has never been exposed to tobacco smoke. She has never used smokeless tobacco. She reports that she does not drink alcohol and does not use drugs.  Allergies:  Allergies  Allergen Reactions   Morphine Hives    Medications reviewed.    ROS Full ROS performed and is otherwise negative other than what is stated in HPI   BP 109/68   Pulse (!) 103   Temp 98.3 F (36.8 C) (Oral)   Ht 5' 2 (1.575 m)   Wt 259 lb 6.4 oz (117.7 kg)   SpO2 92%   BMI 47.44 kg/m   Physical Exam CONSTITUTIONAL: NAd chronically ill BMI 47. EYES: Pupils are equal, round, Sclera are non-icteric. EARS, NOSE, MOUTH AND THROAT: The oropharynx is clear. The oral mucosa is pink and moist. Hearing is intact to voice. LYMPH NODES:  Lymph nodes in the neck are normal. RESPIRATORY:  Lungs are clear. There is normal respiratory effort, with equal breath sounds bilaterally, and without pathologic use of accessory muscles. CARDIOVASCULAR: Heart is regular without murmurs, gallops, or rubs. GI: The abdomen is  soft, nontender, and nondistended. There are no palpable masses. There is no hepatosplenomegaly. There are normal bowel sounds in all quadrants. GU: Rectal deferred.   MUSCULOSKELETAL: Normal muscle  strength and tone. No cyanosis or edema.   SKIN: Turgor is good and there are no pathologic skin lesions or ulcers. NEUROLOGIC: Motor and sensation is grossly normal. Cranial nerves are grossly intact. PSYCH:  Oriented to person, place and time. Affect is normal.   Data Reviewed I have personally reviewed the patient's imaging, laboratory findings and medical records.     Assessment/Plan 73 year old female with type III paraesophageal hernia causing significant symptoms.  She does have a generous BMI in the order of 47, our goal is a weight of 200 lbs. She is motivated and wishes to explore weight loss options w pcp. I had a good discussion with the patient regarding her disease  I do think that some of the atypical chest pain might be related to the paraesophageal hernia and given the size it might be  causing some compression symptoms. She was disappointed but also understands the importance of weight optimization.  I personally spent a total of 40 minutes in the care of the patient today including performing a medically appropriate exam/evaluation, counseling and educating, placing orders, referring and communicating with other health care professionals, documenting clinical information in the EHR, independently interpreting and reviewing images studies and coordinating care.         Laneta Luna, MD [facility] General Surgeon

## 2024-06-22 NOTE — Telephone Encounter (Signed)
 Patient was told by Dr. Jordis today  that needed to lose 60 lbs before he would fix her hernia.  She is wanting you to prescribe Zepbound  again and see if we can get prior authorization for this. Please let patient know.

## 2024-06-22 NOTE — Patient Instructions (Signed)
Hiatal Hernia  A hiatal hernia occurs when part of the stomach slides above the muscle that separates the abdomen from the chest (diaphragm). A person can be born with a hiatal hernia (congenital), or it may develop over time. In almost all cases of hiatal hernia, only the top part of the stomach pushes through the diaphragm. Many people have a hiatal hernia with no symptoms. The larger the hernia, the more likely it is that you will have symptoms. In some cases, a hiatal hernia allows stomach acid to flow back into the tube that carries food from your mouth to your stomach (esophagus). This may cause heartburn symptoms. The development of heartburn symptoms may mean that you have a condition called gastroesophageal reflux disease (GERD). What are the causes? This condition is caused by a weakness in the opening (hiatus) where the esophagus passes through the diaphragm to attach to the upper part of the stomach. A person may be born with a weakness in the hiatus, or a weakness can develop over time. What increases the risk? This condition is more likely to develop in: Older people. Age is a major risk factor for a hiatal hernia, especially if you are over the age of 74. Pregnant women. People who are overweight. People who have frequent constipation. What are the signs or symptoms? Symptoms of this condition usually develop in the form of GERD symptoms. Symptoms include: Heartburn. Upset stomach (indigestion). Trouble swallowing. Coughing or wheezing. Wheezing is making high-pitched whistling sounds when you breathe. Sore throat. Chest pain. Nausea and vomiting. How is this diagnosed? This condition may be diagnosed during testing for GERD. Tests that may be done include: X-rays of your stomach or chest. An upper gastrointestinal (GI) series. This is an X-ray exam of your GI tract that is taken after you swallow a chalky liquid that shows up clearly on the X-ray. Endoscopy. This is a  procedure to look into your stomach using a thin, flexible tube that has a tiny camera and light on the end of it. How is this treated? This condition may be treated by: Dietary and lifestyle changes to help reduce GERD symptoms. Medicines. These may include: Over-the-counter antacids. Medicines that make your stomach empty more quickly. Medicines that block the production of stomach acid (H2 blockers). Stronger medicines to reduce stomach acid (proton pump inhibitors). Surgery to repair the hernia, if other treatments are not helping. If you have no symptoms, you may not need treatment. Follow these instructions at home: Lifestyle and activity Do not use any products that contain nicotine or tobacco. These products include cigarettes, chewing tobacco, and vaping devices, such as e-cigarettes. If you need help quitting, ask your health care provider. Try to achieve and maintain a healthy body weight. Avoid putting pressure on your abdomen. Anything that puts pressure on your abdomen increases the amount of acid that may be pushed up into your esophagus. Avoid bending over, especially after eating. Raise the head of your bed by putting blocks under the legs. This keeps your head and esophagus higher than your stomach. Do not wear tight clothing around your chest or stomach. Try not to strain when having a bowel movement, when urinating, or when lifting heavy objects. Eating and drinking Avoid foods that can worsen GERD symptoms. These may include: Fatty foods, like fried foods. Citrus fruits, like oranges or lemon. Other foods and drinks that contain acid, like orange juice or tomatoes. Spicy food. Chocolate. Eat frequent small meals instead of three large meals a  day. This helps prevent your stomach from getting too full. Eat slowly. Do not lie down right after eating. Do not eat 1-2 hours before bed. Do not drink beverages with caffeine. These include cola, coffee, cocoa, and tea. Do  not drink alcohol. General instructions Take over-the-counter and prescription medicines only as told by your health care provider. Keep all follow-up visits. Your health care provider will want to check that any new prescribed medicines are helping your symptoms. Contact a health care provider if: Your symptoms are not controlled with medicines or lifestyle changes. You are having trouble swallowing. You have coughing or wheezing that will not go away. Your pain is getting worse. Your pain spreads to your arms, neck, jaw, teeth, or back. You feel nauseous or you vomit. Get help right away if: You have shortness of breath. You vomit blood. You have bright red blood in your stools. You have black, tarry stools. These symptoms may be an emergency. Get help right away. Call 911. Do not wait to see if the symptoms will go away. Do not drive yourself to the hospital. Summary A hiatal hernia occurs when part of the stomach slides above the muscle that separates the abdomen from the chest. A person may be born with a weakness in the hiatus, or a weakness can develop over time. Symptoms of a hiatal hernia may include heartburn, trouble swallowing, or sore throat. Management of a hiatal hernia includes eating frequent small meals instead of three large meals a day. Get help right away if you vomit blood, have bright red blood in your stools, or have black, tarry stools. This information is not intended to replace advice given to you by your health care provider. Make sure you discuss any questions you have with your health care provider. Document Revised: 10/22/2021 Document Reviewed: 10/22/2021 Elsevier Patient Education  2024 [employer].

## 2024-06-23 ENCOUNTER — Ambulatory Visit (INDEPENDENT_AMBULATORY_CARE_PROVIDER_SITE_OTHER): Admitting: Professional Counselor

## 2024-06-23 DIAGNOSIS — F4310 Post-traumatic stress disorder, unspecified: Secondary | ICD-10-CM

## 2024-06-23 DIAGNOSIS — F3341 Major depressive disorder, recurrent, in partial remission: Secondary | ICD-10-CM

## 2024-06-23 NOTE — Progress Notes (Signed)
  THERAPIST PROGRESS NOTE  Session Time: 11:02 AM - 11:55 AM  Participation Level: Active  Behavioral Response: Casual, Alert, Euthymic  Type of Therapy: Individual Therapy  Treatment Goals addressed: Active Depression  LTG: I guess when things get to where I can say what I want to say. Stand up for myself. Accept and know I can't change her.  (Progressing)                Start:  02/15/24    Expected End:  02/13/25    STG: To improve assertiveness AEB utilizing interpersonal effectiveness skills over the next 90 days.  (Progressing)  Goal Note Reviewed 05/10/24 I've got this neighbor. She doesn't speak English very much. When she comes visit, she just walks right in. It happened a lot when I was sick, I just left the door unlocked, but now she just walks right in. I told her she's gonna have to ring the doorbell. This was yesterday.    STG: To reduce judgementalness AEB practicing mindfulness skills and focusing on what's within Faith Padilla's control over the next 90 days. (Progressing)  Goal Note Reviewed 05/10/24 I'm ill with the cat again and I'm ill with the lady who works in the [employer].    STG: To improve memory AEB utilizing mindfulness skills over the next 12 weeks (Initial)  ProgressTowards Goals: Progressing  Interventions: CBT, Motivational Interviewing, Assertiveness Training, and Supportive  Summary: Faith Padilla is a 73 y.o. female who presents with a history of depression and trauma. She appeared alert and oriented x5. She shared updates since her previous session, including some updates with her daughter. Faith Padilla continues to struggle with frustrations from her cat and being overwhelmed with neighbors. Faith Padilla was in agreement she needs to work on being more assertive. She was able to identify some ways in which she has been able to say no or set boundaries.   Therapist Response: Conducted session with Faith Padilla. Began session with check-in/update since previous session.  Utilized empathetic and reflective listening. Used open-ended questions to facilitate discussion and summarized Faith Padilla's thoughts/feelings. Identified plan for next session to include memory concerns, weight, and assertiveness. Scheduled additional appointment and concluded session.   Suicidal/Homicidal: No  Plan: Return again in 5 weeks.  Diagnosis: MDD (major depressive disorder), recurrent, in partial remission  PTSD (post-traumatic stress disorder)  Collaboration of Care: Medication Management AEB chart review  Patient/Guardian was advised Release of Information must be obtained prior to any record release in order to collaborate their care with an outside provider. Patient/Guardian was advised if they have not already done so to contact the registration department to sign all necessary forms in order for us  to release information regarding their care.   Consent: Patient/Guardian gives verbal consent for treatment and assignment of benefits for services provided during this visit. Patient/Guardian expressed understanding and agreed to proceed.   Faith Padilla, [facility] 06/23/2024

## 2024-06-24 ENCOUNTER — Telehealth (HOSPITAL_BASED_OUTPATIENT_CLINIC_OR_DEPARTMENT_OTHER): Payer: Self-pay

## 2024-06-24 ENCOUNTER — Telehealth: Payer: Self-pay

## 2024-06-24 NOTE — Telephone Encounter (Signed)
 CMN received for CPAP supplies from [employer] signed by provider and faxed confirmation received

## 2024-06-24 NOTE — Telephone Encounter (Signed)
 Copied from CRM [id number]. Topic: General - Other >> Jun 24, 2024  1:13 PM Fonda T wrote: Reason for CRM: Received call from [place] [employer], Case manager with [facility], calling on behalf of patient.   Calling requesting a patient evaluation for Medicaid PCS services for long term care. Completion of Form [id number] Medicaid form can be completed for this request.   Also patient is inquiring on weight loss medication status, to assist with weight loss.   Caller states Patient has osteoporosis, and would like to know when last bone density was, and if medication is needed.   Requesting a return call to Hardin Marten, care manager with [facility], at [phone]   Or patient can be reached at [phone], regarding above questions.

## 2024-06-27 NOTE — Telephone Encounter (Signed)
 Called patient to inform her of provider's message, she stated that she has an appointment already scheduled with the provider at the end of the month and will discuss this with her then.
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
## 2024-07-05 NOTE — Telephone Encounter (Signed)
 Called number provided, was the number for [facility] and they stated they would have to send an email to find Faith Padilla.  However patient has an appointment with provider on 07/08/2024.

## 2024-07-07 ENCOUNTER — Other Ambulatory Visit: Payer: Self-pay

## 2024-07-07 ENCOUNTER — Ambulatory Visit (INDEPENDENT_AMBULATORY_CARE_PROVIDER_SITE_OTHER): Admitting: Psychiatry

## 2024-07-07 VITALS — BP 114/74 | HR 90 | Temp 96.8°F | Ht 62.0 in | Wt 261.2 lb

## 2024-07-07 DIAGNOSIS — F330 Major depressive disorder, recurrent, mild: Secondary | ICD-10-CM | POA: Diagnosis not present

## 2024-07-07 DIAGNOSIS — G4700 Insomnia, unspecified: Secondary | ICD-10-CM | POA: Diagnosis not present

## 2024-07-07 DIAGNOSIS — F4310 Post-traumatic stress disorder, unspecified: Secondary | ICD-10-CM

## 2024-07-07 MED ORDER — VENLAFAXINE HCL ER 150 MG PO CP24: 150.0000 mg | ORAL_CAPSULE | Freq: Every day | ORAL | 1 refills | Status: DC

## 2024-07-07 NOTE — Patient Instructions (Signed)
 Continue venlafaxine  150 mg daily  Next appointment: 12/18 at 1 pm.

## 2024-07-08 ENCOUNTER — Encounter: Payer: Self-pay | Admitting: Family Medicine

## 2024-07-08 ENCOUNTER — Ambulatory Visit (INDEPENDENT_AMBULATORY_CARE_PROVIDER_SITE_OTHER): Admitting: Family Medicine

## 2024-07-08 VITALS — BP 111/71 | HR 103 | Temp 97.7°F | Ht 62.0 in | Wt 263.2 lb

## 2024-07-08 DIAGNOSIS — G3184 Mild cognitive impairment, so stated: Secondary | ICD-10-CM

## 2024-07-08 DIAGNOSIS — R42 Dizziness and giddiness: Secondary | ICD-10-CM | POA: Diagnosis not present

## 2024-07-08 DIAGNOSIS — G5603 Carpal tunnel syndrome, bilateral upper limbs: Secondary | ICD-10-CM

## 2024-07-08 DIAGNOSIS — K219 Gastro-esophageal reflux disease without esophagitis: Secondary | ICD-10-CM

## 2024-07-08 DIAGNOSIS — Z6841 Body Mass Index (BMI) 40.0 and over, adult: Secondary | ICD-10-CM

## 2024-07-08 DIAGNOSIS — Z7689 Persons encountering health services in other specified circumstances: Secondary | ICD-10-CM | POA: Diagnosis not present

## 2024-07-08 DIAGNOSIS — K449 Diaphragmatic hernia without obstruction or gangrene: Secondary | ICD-10-CM

## 2024-07-08 DIAGNOSIS — M159 Polyosteoarthritis, unspecified: Secondary | ICD-10-CM

## 2024-07-08 MED ORDER — MELOXICAM 15 MG PO TABS: 15.0000 mg | ORAL_TABLET | Freq: Every day | ORAL | 1 refills | Status: AC

## 2024-07-08 MED ORDER — NALTREXONE HCL 50 MG PO TABS: 25.0000 mg | ORAL_TABLET | Freq: Every day | ORAL | 2 refills | Status: AC

## 2024-07-08 NOTE — Progress Notes (Signed)
 Established patient visit   Patient: Faith Padilla   DOB: 1951/09/05   73 y.o. Female  MRN: [id number] Visit Date: 07/08/2024  Today's healthcare provider: LAURAINE LOISE BUOY, DO   Chief Complaint  Patient presents with   Follow-up    Patient is here to follow up in about 4 weeks for MME/memory assessment.   Subjective    HPI Faith Padilla is a 73 year old female who presents with memory concerns following hip surgery.  She has been experiencing memory issues since her hip surgery on August 13th of the previous year. She struggles with recalling words and numbers, especially when not writing them down, and often relies on her calendar and appointment book to manage her schedule. No family history of dementia or memory problems is reported.  She experiences episodes of dizziness and balance issues, particularly when standing or walking short distances. She recalls a specific incident of dizziness while walking to a window, necessitating a return to her chair.  She attributes some symptoms to a hernia pressing against her lungs and reports that she was told she needs to lose sixty pounds before surgery can be performed.  She reports pain in the tips of her fingers, especially when crocheting, describing it as a sensation of 'a little tiny needle' hitting her fingers. Additionally, she experiences pain and swelling in her wrist, particularly during activities involving twisting motions, such as vacuuming or sweeping. The pain is not consistently related to hand positioning.  She has a history of osteoporosis and mentions a bone density test was ordered last year. She also reports persistent breast tenderness, ongoing since she was very young, which makes her unwilling to do mammograms.  She does not consume alcohol and acknowledges not engaging in chair exercises, which could improve her activity level.       Medications: Outpatient Medications Prior to Visit  Medication Sig    atorvastatin  (LIPITOR) 20 MG tablet TAKE 1 TABLET BY MOUTH EVERY DAY   Biotin 1 MG CAPS Take 1 capsule by mouth daily.   budesonide -formoterol  (SYMBICORT ) 160-4.5 MCG/ACT inhaler Inhale 2 puffs into the lungs 2 (two) times daily.   CALCIUM  PO Take by mouth.   cetirizine  (ZYRTEC ) 10 MG tablet TAKE 1 TABLET BY MOUTH EVERY DAY   clotrimazole -betamethasone  (LOTRISONE ) cream Apply 1 Application topically 2 (two) times daily.   hydrOXYzine  (ATARAX ) 50 MG tablet TAKE 1 TABLET BY MOUTH DAILY AS NEEDED FOR ANXIETY OR SLEEP   ipratropium (ATROVENT ) 0.03 % nasal spray PLACE 2 SPRAYS INTO BOTH NOSTRILS EVERY 12 HOURS USE IN PLACE OF FLONASE    ipratropium-albuterol  (DUONEB) 0.5-2.5 (3) MG/3ML SOLN Take 3 mLs by nebulization every 4 (four) hours as needed.   levothyroxine  (SYNTHROID ) 75 MCG tablet Take 1 tablet (75 mcg total) by mouth daily before breakfast.   Misc Natural Products (TURMERIC, CURCUMIN, PO) Take by mouth.   montelukast  (SINGULAIR ) 10 MG tablet TAKE 1 TABLET(10 MG) BY MOUTH AT BEDTIME   nystatin  (MYCOSTATIN /NYSTOP ) powder Apply 1 Application topically 3 (three) times daily.   omeprazole  (PRILOSEC) 20 MG capsule TAKE 1 CAPSULE(20 MG) BY MOUTH TWICE DAILY BEFORE A MEAL   oxybutynin  (DITROPAN -XL) 10 MG 24 hr tablet Take 1 tablet (10 mg total) by mouth daily.   trimethoprim  (TRIMPEX ) 100 MG tablet Take 1 tablet (100 mg total) by mouth daily.   [START ON 07/22/2024] venlafaxine  XR (EFFEXOR -XR) 150 MG 24 hr capsule Take 1 capsule (150 mg total) by mouth daily with breakfast.   [  DISCONTINUED] meloxicam  (MOBIC ) 15 MG tablet Take 1 tablet by mouth daily.   Facility-Administered Medications Prior to Visit  Medication Dose Route Frequency Provider   albuterol  (PROVENTIL ) (2.5 MG/3ML) 0.083% nebulizer solution 2.5 mg  2.5 mg Nebulization Q4H Cobb, Comer GAILS, NP        Objective    BP 111/71 (BP Location: Left Arm, Patient Position: Sitting, Cuff Size: Large)   Pulse (!) 103   Temp 97.7 F (36.5  C) (Oral)   Ht 5' 2 (1.575 m)   Wt 263 lb 3.2 oz (119.4 kg)   SpO2 96%   BMI 48.14 kg/m     Physical Exam Vitals and nursing note reviewed.  Constitutional:      General: She is not in acute distress.    Appearance: Normal appearance.  HENT:     Head: Normocephalic and atraumatic.  Eyes:     General: No scleral icterus.    Conjunctiva/sclera: Conjunctivae normal.  Cardiovascular:     Rate and Rhythm: Normal rate.  Pulmonary:     Effort: Pulmonary effort is normal.  Neurological:     Mental Status: She is alert and oriented to person, place, and time. Mental status is at baseline.     Comments: Phalen test unrevealing due to osteoarthritis pain in MCP joints.  Psychiatric:        Mood and Affect: Mood normal.        Behavior: Behavior normal.      No results found for any visits on 07/08/24.  Assessment & Plan    Mild cognitive impairment -     Ambulatory referral to Neurology  Morbid obesity with BMI of 45.0-49.9, adult (HCC) -     Naltrexone HCl; Take 0.5-1 tablets (25-50 mg total) by mouth daily.  Dispense: 30 tablet; Refill: 2  Encounter for weight management -     Naltrexone HCl; Take 0.5-1 tablets (25-50 mg total) by mouth daily.  Dispense: 30 tablet; Refill: 2  Dizziness  Hiatal hernia with gastroesophageal reflux  Osteoarthritis of multiple joints, unspecified osteoarthritis type -     Meloxicam ; Take 1 tablet (15 mg total) by mouth daily.  Dispense: 100 tablet; Refill: 1  Bilateral carpal tunnel syndrome      Mild cognitive impairment Mild cognitive impairment with FAQ (9/30, self-reported) and MoCA (24/30) on chart. Memory recall issues occurring since after hip surgery in August 2024. No family history of dementia.   - Referred to local memory and cognitive clinic for further evaluation.  Morbid obesity with BMI of 45.0-49.9; encounter for weight management Morbid obesity with difficulty in weight management. Previous tirzepatide  not pursued  due to cost.  OSA not severe enough to allow coverage of Zepbound  for that indication.  Recommended chair exercises for balance and mobility limitations.  Previous trial of topiramate  unsuccessful. - Prescribed naltrexone for appetite suppression. - Recommended chair exercises to increase activity levels.  Hiatal hernia with gastroesophageal reflux; dizziness Abdominal hiatal hernia with dizziness and heaviness, possibly due to lung pressure. Surgery contingent on 60-pound weight loss.  Patient has had extensive workup with cardiology.  Discussed potential for long-term heart monitor, which patient declined for the time being.  Osteoarthritis of multiple joints, unspecified osteoarthritis type Patient prefers not to return to orthopedics at this time.  Will refill her meloxicam  as it has benefited her.  Carpal tunnel syndrome Symptoms suggestive of carpal tunnel syndrome, including pain and tingling during activities like crocheting.  Phalen test unrevealing due to patient difficulty maintaining position  given osteoarthritis pain in MCP joints. - Recommended carpal tunnel wrist splint for nighttime use.    Contact information for scheduling DEXA scan and mammogram provided to patient today.  Patient advised to call soon to schedule; she expressed understanding.    Return in about 8 weeks (around 09/02/2024) for Weight, Chronic f/u.      I discussed the assessment and treatment plan with the patient  The patient was provided an opportunity to ask questions and all were answered. The patient agreed with the plan and demonstrated an understanding of the instructions.   The patient was advised to call back or seek an in-person evaluation if the symptoms worsen or if the condition fails to improve as anticipated.  Total time was 45 minutes. That includes chart review before the visit, the actual patient visit, and time spent on documentation after the visit.     LAURAINE LOISE BUOY, DO  [facility] [phone] (phone) [phone] (fax)  [facility]

## 2024-07-08 NOTE — Patient Instructions (Signed)
 Please call the [facility] [phone]) to schedule a routine screening bone density test and mammogram.

## 2024-07-11 ENCOUNTER — Ambulatory Visit (INDEPENDENT_AMBULATORY_CARE_PROVIDER_SITE_OTHER): Admitting: Urology

## 2024-07-11 VITALS — BP 124/80 | HR 112

## 2024-07-11 DIAGNOSIS — N3941 Urge incontinence: Secondary | ICD-10-CM

## 2024-07-11 LAB — URINALYSIS, COMPLETE
Bilirubin, UA: NEGATIVE
Glucose, UA: NEGATIVE
Nitrite, UA: NEGATIVE
Protein,UA: NEGATIVE
RBC, UA: NEGATIVE
Specific Gravity, UA: 1.03 (ref 1.005–1.030)
Urobilinogen, Ur: 0.2 mg/dL (ref 0.2–1.0)
pH, UA: 6 (ref 5.0–7.5)

## 2024-07-11 LAB — MICROSCOPIC EXAMINATION: Epithelial Cells (non renal): >10 /HPF — AB (ref 0–10)

## 2024-07-11 NOTE — Progress Notes (Signed)
 07/11/2024 1:32 PM   Miryam Mcelhinney [birth year] [id number]  Referring provider: Donzella Lauraine SAILOR, DO [address] [place],  [place] [address]  No chief complaint on file.   HPI: 7 years ago was on Vesicare .  Had some prolapse symptoms.  Estrogen cream provided by extender   Today Patient has urge incontinence but does not leak with coughing sneezing.  No bedwetting.  3 heavier briefs a day that are soaked.  Voids every 1 hour and cannot hold it for 2 hours.  Voids hourly at night.  No ankle edema.  No diuretic   Has had 2 low back operations and hysterectomy   Recently went on Vesicare  and had dry mouth and retention symptoms   On further questioning patient was on Myrbetriq for years and she said it did not work.  In the last 6 months she says she has some suprapubic discomfort when she is full and during urination.  It is relieved when she voids     Patient has an overactive bladder with urge incontinence.  She has frequency and significant nocturia.  Reassess in 6 weeks on Gemtesa  samples and prescription for pelvic examination and cystoscopy.  Call if culture positive.  She understands that a bladder infection would explain the discomfort but otherwise I will be performing cystoscopy for incontinence and discomfort with urination    Last culture positive. The patient said when I treated the bladder infection her frequency got a lot better at her but she was still leaking some.  She said the Gemtesa  did not help her.  She says sometimes urine can be cloudy or foul-smelling but does not give a strong history of recurrent UTIs   On pelvic examination she had a well supported bladder neck and no prolapse or stress incontinence with a moderate cough after cystoscopy Cystoscopy: Patient underwent flexible cystoscopy.  Urine was cloudy with a lot of white flecks.  Bladder urothelium within normal limits but she may have had some mild findings of cystitis cystica.  No carcinoma.      I would like to send in ciprofloxacin  250 mg twice a day for 7 days and have her come back in 6 to 8 weeks on daily trimethoprim . I am hoping this will down regulate some of her incontinence. It may explain why some of the medications have not helped her. Another trial of an antimuscarinic may be prudent. Third line therapies are options. She has not had urodynamics    last culture demonstrated gram-positive organisms. Patient says she is infection free.  She says she is 80% better in terms of her urge incontinence.  Reassess durability in 4 months. Call if culture positive. She has dramatically down regulated. She understands that medication in the past may not of help because of her infections.   Urgency incontinence dramatically better.  Still wears 2 pads a day.  Clinically infection free.  Frequency stable.  Patient was sent home on Gemtesa  samples to try again in combination with trimethoprim    In the last several weeks urge incontinence much better.  Soaking through 4 pads a day.  No cystitis symptoms.  She did not renew the Gemtesa .  Still on daily trimethoprim .  Frequency stable. She thinks she may have a low-grade yeast infection   The patient has high-volume urge incontinence.  Role of urodynamics discussed.  Call if culture positive.  Patient will stay on the trimethoprim  and I will call if the culture is positive.  She wants to  try oxybutynin  ER 10 mg 3 x 11 and she wants to do urodynamics.  If she does great she can cancel the test.  We will proceed accordingly.  I called in Diflucan  100 mg daily for 3 days   Today Frequency stable.  Last culture negative Dramatically better on oxybutynin .  She had a lot of test so she never had urodynamics clinically not infected   PMH: Past Medical History:  Diagnosis Date   Abdominal pain, right upper quadrant    Abdominal pain, RUQ (right upper quadrant) 03/12/2016   Achilles tendinitis of right lower extremity 06/25/2023   Acid reflux     Allergic conjunctivitis of both eyes 06/29/2017   Anemia 12/04/2019   Anxiety and depression    Asthma    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) 08/13/2021   Complication of anesthesia    slow to awaken, wakes coughing and SOB   COPD (chronic obstructive pulmonary disease) (HCC)    Depression    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   Diverticulitis    Dry eye syndrome of both eyes 08/13/2021   Dyspnea on exertion 08/13/2021   Family history of problems related to stress 11/15/2021   Frequency of urination and polyuria 06/25/2022   GAD (generalized anxiety disorder) 08/15/2021   Glaucoma    H/O urinary incontinence 06/25/2022   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   High risk medication use 08/02/2018   HLD (hyperlipidemia)    Iron deficiency 12/04/2019   Isolation (social) 12/07/2021   Kidney stones    Microscopic hematuria    Mixed incontinence    Morbid obesity (HCC)    Ocular hypertension, 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   Ocular migraine 08/02/2018   Osteoarthritis    lumbar spine   Osteoarthritis of right knee 08/28/2023   Palpitations 08/13/2021   Plantar fasciitis 06/25/2023   Post covid-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, 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   Postoperative follow-up 03/30/2020   Prediabetes 08/14/2015   Pseudophakia, both eyes 06/26/2016   Pulmonary fibrosis (HCC) 2020   Rectal bleeding 10/17/2021   Shortness of breath dyspnea    Sleep apnea    CPAP   Vaginal yeast infection    Wears dentures    partial upper    Surgical History: Past Surgical History:  Procedure Laterality Date   ABDOMINAL HYSTERECTOMY  09/08/1996   BACK SURGERY  1990/2014   CATARACT EXTRACTION, BILATERAL  09/08/2012   COLONOSCOPY N/A 12/17/2023   Procedure: COLONOSCOPY;  Surgeon: Jinny Carmine, MD;  Location: [facility] ENDOSCOPY;  Service: Endoscopy;  Laterality: N/A;   COLONOSCOPY WITH PROPOFOL  N/A 03/25/2016   Procedure: COLONOSCOPY  WITH PROPOFOL ;  Surgeon: Carmine Jinny, MD;  Location: [facility] ENDOSCOPY;  Service: Endoscopy;  Laterality: N/A;   deviated nose septum surgery  09/08/1998   ESOPHAGOGASTRODUODENOSCOPY N/A 06/02/2024   Procedure: EGD (ESOPHAGOGASTRODUODENOSCOPY);  Surgeon: Jinny Carmine, MD;  Location: [facility] ENDOSCOPY;  Service: Endoscopy;  Laterality: N/A;   ESOPHAGOGASTRODUODENOSCOPY (EGD) WITH PROPOFOL  N/A 03/25/2016   Procedure: ESOPHAGOGASTRODUODENOSCOPY (EGD) WITH PROPOFOL ;  Surgeon: Carmine Jinny, MD;  Location: [facility] ENDOSCOPY;  Service: Endoscopy;  Laterality: N/A;   EYE SURGERY     JOINT REPLACEMENT     LITHOTRIPSY  09/08/2010   POLYPECTOMY  12/17/2023   Procedure: POLYPECTOMY, INTESTINE;  Surgeon: Jinny Carmine, MD;  Location: [facility] ENDOSCOPY;  Service: Endoscopy;;   TOTAL HIP ARTHROPLASTY Left     Home Medications:  Allergies as of 07/11/2024       Reactions   Morphine Hives  Medication List        Accurate as of July 11, 2024  1:32 PM. If you have any questions, ask your nurse or doctor.          atorvastatin  20 MG tablet Commonly known as: LIPITOR TAKE 1 TABLET BY MOUTH EVERY DAY   Biotin 1 MG Caps Take 1 capsule by mouth daily.   budesonide -formoterol  160-4.5 MCG/ACT inhaler Commonly known as: SYMBICORT  Inhale 2 puffs into the lungs 2 (two) times daily.   CALCIUM  PO Take by mouth.   cetirizine  10 MG tablet Commonly known as: ZYRTEC  TAKE 1 TABLET BY MOUTH EVERY DAY   clotrimazole -betamethasone  cream Commonly known as: LOTRISONE  Apply 1 Application topically 2 (two) times daily.   hydrOXYzine  50 MG tablet Commonly known as: ATARAX  TAKE 1 TABLET BY MOUTH DAILY AS NEEDED FOR ANXIETY OR SLEEP   ipratropium 0.03 % nasal spray Commonly known as: ATROVENT  PLACE 2 SPRAYS INTO BOTH NOSTRILS EVERY 12 HOURS USE IN PLACE OF FLONASE    ipratropium-albuterol  0.5-2.5 (3) MG/3ML Soln Commonly known as: DUONEB Take 3 mLs by nebulization every 4 (four) hours as needed.    levothyroxine  75 MCG tablet Commonly known as: SYNTHROID  Take 1 tablet (75 mcg total) by mouth daily before breakfast.   meloxicam  15 MG tablet Commonly known as: MOBIC  Take 1 tablet (15 mg total) by mouth daily.   montelukast  10 MG tablet Commonly known as: SINGULAIR  TAKE 1 TABLET(10 MG) BY MOUTH AT BEDTIME   naltrexone 50 MG tablet Commonly known as: DEPADE Take 0.5-1 tablets (25-50 mg total) by mouth daily.   nystatin  powder Commonly known as: MYCOSTATIN /NYSTOP  Apply 1 Application topically 3 (three) times daily.   omeprazole  20 MG capsule Commonly known as: PRILOSEC TAKE 1 CAPSULE(20 MG) BY MOUTH TWICE DAILY BEFORE A MEAL   oxybutynin  10 MG 24 hr tablet Commonly known as: DITROPAN -XL Take 1 tablet (10 mg total) by mouth daily.   trimethoprim  100 MG tablet Commonly known as: TRIMPEX  Take 1 tablet (100 mg total) by mouth daily.   TURMERIC (CURCUMIN) PO Take by mouth.   venlafaxine  XR 150 MG 24 hr capsule Commonly known as: EFFEXOR -XR Take 1 capsule (150 mg total) by mouth daily with breakfast. Start taking on: July 22, 2024        Allergies:  Allergies  Allergen Reactions   Morphine Hives    Family History: Family History  Problem Relation Age of Onset   Depression Mother    Asthma Mother    Heart disease Mother    Diabetes Mother    Cancer Father        lung cancer   COPD Father    Liver cancer Father    Cancer Sister        Brain Cancer   Allergic rhinitis Sister    Asthma Sister    Depression Daughter    Hematuria Daughter    Bladder Cancer Neg Hx     Social History:  reports that she has never smoked. She has never been exposed to tobacco smoke. She has never used smokeless tobacco. She reports that she does not drink alcohol and does not use drugs.  ROS:                                        Physical Exam: There were no vitals taken for this visit.  Constitutional:  Alert and oriented, No acute  distress. HEENT: [place] AT, moist mucus membranes.  Trachea midline, no masses.   Laboratory Data: Lab Results  Component Value Date   WBC 7.4 04/28/2024   HGB 13.7 04/28/2024   HCT 43.1 04/28/2024   MCV 97.7 04/28/2024   PLT 322 04/28/2024    Lab Results  Component Value Date   CREATININE 1.15 (H) 06/09/2024    No results found for: PSA  No results found for: TESTOSTERONE  Lab Results  Component Value Date   HGBA1C 5.8 (H) 06/09/2024    Urinalysis    Component Value Date/Time   COLORURINE YELLOW (A) 03/04/2016 1920   APPEARANCEUR Clear 04/25/2024 1336   LABSPEC 1.014 03/04/2016 1920   LABSPEC 1.014 07/24/2014 1519   PHURINE 7.0 03/04/2016 1920   GLUCOSEU Negative 04/25/2024 1336   GLUCOSEU Negative 07/24/2014 1519   HGBUR NEGATIVE 03/04/2016 1920   BILIRUBINUR Negative 04/25/2024 1336   BILIRUBINUR Negative 07/24/2014 1519   KETONESUR NEGATIVE 03/04/2016 1920   PROTEINUR Negative 04/25/2024 1336   PROTEINUR NEGATIVE 03/04/2016 1920   UROBILINOGEN negative 12/06/2015 1448   NITRITE Negative 04/25/2024 1336   NITRITE NEGATIVE 03/04/2016 1920   LEUKOCYTESUR Negative 04/25/2024 1336   LEUKOCYTESUR 2+ 07/24/2014 1519    Pertinent Imaging:   Assessment & Plan: Reassess durability in 5 months.  She does not need urodynamics at this stage  1. Urgency incontinence (Primary)    No follow-ups on file.  Glendia DELENA Elizabeth, MD  [facility] [address] [place], [place] [address] [phone]

## 2024-07-21 ENCOUNTER — Ambulatory Visit: Admitting: Pulmonary Disease

## 2024-07-21 ENCOUNTER — Encounter: Payer: Self-pay | Admitting: Pulmonary Disease

## 2024-07-21 ENCOUNTER — Encounter: Payer: Self-pay | Admitting: Family Medicine

## 2024-07-21 VITALS — BP 122/80 | HR 96 | Temp 98.1°F | Ht 62.0 in | Wt 262.0 lb

## 2024-07-21 DIAGNOSIS — K449 Diaphragmatic hernia without obstruction or gangrene: Secondary | ICD-10-CM

## 2024-07-21 DIAGNOSIS — R0602 Shortness of breath: Secondary | ICD-10-CM | POA: Diagnosis not present

## 2024-07-21 DIAGNOSIS — J8410 Pulmonary fibrosis, unspecified: Secondary | ICD-10-CM

## 2024-07-21 DIAGNOSIS — Z6841 Body Mass Index (BMI) 40.0 and over, adult: Secondary | ICD-10-CM

## 2024-07-21 DIAGNOSIS — K219 Gastro-esophageal reflux disease without esophagitis: Secondary | ICD-10-CM

## 2024-07-21 NOTE — Progress Notes (Signed)
 Subjective:    Patient ID: Faith Padilla, female    DOB: [birth year], 73 y.o.   MRN: [id number]  Patient Care Team: Donzella Lauraine SAILOR, DO as PCP - General (Family Medicine) Kassie Acquanetta Bradley, MD as Consulting Physician (Pulmonary Disease) Gaston Hamilton, MD as Consulting Physician (Urology) [facility], [facility] (Orthopedic Surgery) Shilt, Norleen Barter, PA-C (Orthopedic Surgery) Tobie Arleta SQUIBB, MD as Consulting Physician (Allergy  and Immunology) [place], PA-C as Physician Assistant (Gastroenterology) [facility], [facility])  Chief Complaint  Patient presents with   Shortness of Breath    Shortness of breath on exertion.    BACKGROUND/INTERVAL: 73 year old female, never smoker followed for postinflammatory pulmonary fibrosis and chronic respiratory failure, chronic cough.  She was a patient Dr. Laymond and last seen in office 04/27/2024 by K. Cobb NP.  Past medical history significant for OSA on CPAP, hiatal hernia, GERD, hypothyroid, CKD, prediabetes, fibromyalgia, depression.  She transition her care to me on 17 May 2024.  HPI Discussed the use of AI scribe software for clinical note transcription with the patient, who gave verbal consent to proceed.  History of Present Illness   The patient, with COPD and a large hiatal hernia, presents with shortness of breath.  She experiences shortness of breath, which she attributes in part to a very large hiatal hernia. Despite being on weight loss medication prescribed by her primary care physician, it has not been effective.  Insurance has not covered GLP-1's.  She has a history of COPD but does not regularly use her inhalers, only using them when she experiences severe coughing, particularly after eating. She mentions that she was hospitalized for three weeks due to COVID-19 and was previously being followed for pulmonary fibrosis.   She has attempted weight loss for the past two weeks but finds it challenging, losing a pound  only to regain it the next day. She lives alone and finds it impractical to cook large meals for one person.  She has undergone a sleep study and uses a sleep app, but her sleep apnea is not severe enough to qualify for certain treatments. Her BMI is noted to be 47.9.   She completed pulmonary rehab previously with no significant improvement.  She had EGD on 02 June 2024 which confirmed very large hiatal hernia.  Compliance report shows 100% usage over the last 30 days with only 1 day less than 4 hours remainder 97% over 4 hours per night usage.  Patient is on an AutoSet at 8 to 15 cm H2O.  Residual AHI is 1.6.     TEST/EVENTS:  01/27/2013 In lab sleep study [facility]): Mild obstructive sleep apnea with AHI of 9.9/h.  REM sleep AHI 62/h.  Severe desaturations of oxygen with sleep.  Supine AHI was 13.4 08/18/2023 CT chest: Atherosclerosis.  Enlarged PA.  Upper and mid lung zone predominant interstitial coarsening and groundglass with traction bronchiectasis/bronchiolectasis, similar to 2022.  Minimal scattered subpleural reticular densities.  No honeycombing.  Not consistent with UIP.  Very large hiatal hernia. 11/03/2023 PFT: FVC 85, FEV1 98, ratio 86, TLC 96, DLCO 107.  No BD.  Normal lung function 04/09/2024 CXR: Streaky opacity at right lung base.  Mild prominent reticular opacities at left lung base.  Large hiatal hernia 04/20/2024 CXR: lower lung volumes. No acute process.  Review of Systems A 10 point review of systems was performed and it is as noted above otherwise negative.   Patient Active Problem List   Diagnosis Date Noted  Hiatal hernia with gastroesophageal reflux 06/02/2024   Other dysphagia 05/02/2024   Heartburn 05/02/2024   Dark stools 05/02/2024   Other chest pain 05/02/2024   CAP (community acquired pneumonia) 04/15/2024   History of colon polyps 12/17/2023   Polyp of colon 12/17/2023   Heme + stool 12/17/2023   Candidal intertrigo 10/30/2023    Dizziness 10/23/2023   Dehydration 10/23/2023   Black stool 10/23/2023   Heme positive stool 10/23/2023   Acute bronchitis 10/16/2023   Chronic hypoxemic respiratory failure (HCC) 08/11/2023   Encounter for weight loss counseling 08/03/2023   Chronic gout without tophus 08/03/2023   Thrombocytosis 07/20/2023   Hypercalcemia 07/20/2023   Elevated vitamin B12 level 07/20/2023   Fibromyalgia 07/20/2023   Pulmonary fibrosis (HCC) 07/20/2023   Iron deficiency anemia secondary to inadequate dietary iron intake 07/20/2023   Hypothyroidism 07/20/2023   Age-related osteoporosis without current pathological fracture 07/20/2023   Depression, major, single episode, in partial remission 07/20/2023   Chronic kidney disease, stage 2, mildly decreased GFR 07/20/2023   Benign neoplasm of transverse colon    Problems with swallowing and mastication    Hiatal hernia    Seasonal allergic conjunctivitis 12/17/2015   Cystocele, grade 2 11/21/2015   Microscopic hematuria 11/21/2015   Esophageal motility disorder 11/19/2015   Vertigo 10/15/2015   Oral candidiasis 10/15/2015   Subacute frontal sinusitis 10/10/2015   Chest pain at rest 10/10/2015   Prediabetes 08/14/2015   Asthma with acute exacerbation 07/18/2015   Dermatitis 05/31/2015   Rosacea 05/31/2015   Incontinence 04/12/2015   Atrophic vaginitis 04/12/2015   Osteoarthritis of right knee 03/29/2015   Clinical depression 12/11/2014   Diverticulitis 12/11/2014   Auditory acuity evaluation 12/11/2014   Glaucoma 12/11/2014   Hot flash, menopausal 12/11/2014   Calculus of kidney 12/11/2014   Degenerative arthritis of lumbar spine 12/11/2014   Obstructive sleep apnea on CPAP 12/11/2014   Urge incontinence 12/11/2014   Moderate persistent asthma with allergic rhinitis 12/11/2014   Hyperlipidemia 12/11/2014   Gastroesophageal reflux disease 12/11/2014    Social History   Tobacco Use   Smoking status: Never    Passive exposure: Never  (Parent)   Smokeless tobacco: Never  Substance Use Topics   Alcohol use: No    Allergies  Allergen Reactions   Morphine Hives    Current Meds  Medication Sig   atorvastatin  (LIPITOR) 20 MG tablet TAKE 1 TABLET BY MOUTH EVERY DAY   Biotin 1 MG CAPS Take 1 capsule by mouth daily.   budesonide -formoterol  (SYMBICORT ) 160-4.5 MCG/ACT inhaler Inhale 2 puffs into the lungs 2 (two) times daily.   CALCIUM  PO Take by mouth.   cetirizine  (ZYRTEC ) 10 MG tablet TAKE 1 TABLET BY MOUTH EVERY DAY   clotrimazole -betamethasone  (LOTRISONE ) cream Apply 1 Application topically 2 (two) times daily.   hydrOXYzine  (ATARAX ) 50 MG tablet TAKE 1 TABLET BY MOUTH DAILY AS NEEDED FOR ANXIETY OR SLEEP   ipratropium (ATROVENT ) 0.03 % nasal spray PLACE 2 SPRAYS INTO BOTH NOSTRILS EVERY 12 HOURS USE IN PLACE OF FLONASE    ipratropium-albuterol  (DUONEB) 0.5-2.5 (3) MG/3ML SOLN Take 3 mLs by nebulization every 4 (four) hours as needed.   levothyroxine  (SYNTHROID ) 75 MCG tablet Take 1 tablet (75 mcg total) by mouth daily before breakfast.   meloxicam  (MOBIC ) 15 MG tablet Take 1 tablet (15 mg total) by mouth daily.   Misc Natural Products (TURMERIC, CURCUMIN, PO) Take by mouth.   montelukast  (SINGULAIR ) 10 MG tablet TAKE 1 TABLET(10 MG) BY MOUTH AT BEDTIME  naltrexone (DEPADE) 50 MG tablet Take 0.5-1 tablets (25-50 mg total) by mouth daily.   nystatin  (MYCOSTATIN /NYSTOP ) powder Apply 1 Application topically 3 (three) times daily.   omeprazole  (PRILOSEC) 20 MG capsule TAKE 1 CAPSULE(20 MG) BY MOUTH TWICE DAILY BEFORE A MEAL (Patient taking differently: Take 20 mg by mouth 2 (two) times daily before a meal.)   oxybutynin  (DITROPAN -XL) 10 MG 24 hr tablet Take 1 tablet (10 mg total) by mouth daily.   trimethoprim  (TRIMPEX ) 100 MG tablet Take 1 tablet (100 mg total) by mouth daily.   [START ON 07/22/2024] venlafaxine  XR (EFFEXOR -XR) 150 MG 24 hr capsule Take 1 capsule (150 mg total) by mouth daily with breakfast.   Current  Facility-Administered Medications for the 07/21/24 encounter (Office Visit) with Tamea Dedra CROME, MD  Medication   albuterol  (PROVENTIL ) (2.5 MG/3ML) 0.083% nebulizer solution 2.5 mg    Immunization History  Administered Date(s) Administered   Fluad Trivalent(High Dose 65+) 07/20/2023   Fluzone Influenza virus vaccine,trivalent (IIV3), split virus 10/03/2012   INFLUENZA, HIGH DOSE SEASONAL PF 06/09/2024   Influenza, Mdck, Trivalent,PF 6+ MOS(egg free) 09/07/2013   Influenza, Seasonal, Injecte, Preservative Fre 08/22/2014, 05/14/2021   Influenza,inj,Quad PF,6+ Mos 10/03/2012, 08/14/2015   Influenza-Unspecified 10/03/2012, 09/07/2013, 08/22/2014, 08/14/2015   Moderna Sars-Covid-2 Vaccination 02/17/2020, 03/16/2020   PNEUMOCOCCAL CONJUGATE-20 05/17/2024   Pneumococcal Polysaccharide-23 09/09/2011, 10/03/2012, 05/14/2021   Tdap 09/08/2004, 08/21/2011, 02/22/2021        Objective:     BP 122/80   Pulse 96   Temp 98.1 F (36.7 C) (Temporal)   Ht 5' 2 (1.575 m)   Wt 262 lb (118.8 kg)   SpO2 95%   BMI 47.92 kg/m   SpO2: 95 %  GENERAL: Morbidly obese woman, no acute distress, fully ambulatory, no conversational dyspnea. HEAD: Normocephalic, atraumatic.  EYES: Pupils equal, round, reactive to light.  No scleral icterus.  MOUTH: Partial upper.  Oral mucosa moist.  No thrush.   NECK: Supple. No thyromegaly. Trachea midline. No JVD.  No adenopathy. PULMONARY: Good air entry bilaterally.  No adventitious sounds. CARDIOVASCULAR: S1 and S2. Regular rate and rhythm.  ABDOMEN: Obese, otherwise benign. MUSCULOSKELETAL: No joint deformity, no clubbing, no edema.  NEUROLOGIC: No overt focal deficit, no gait disturbance, speech is fluent. SKIN: Intact,warm,dry. PSYCH: Mood and behavior normal.   Assessment & Plan:     ICD-10-CM   1. Large hiatal hernia  K44.9     2. Postinflammatory pulmonary fibrosis (HCC)  J84.10     3. Shortness of breath  R06.02     4. Morbid obesity  (HCC)  E66.01      Discussion:    Hiatal hernia causing shortness of breath Very large hiatal hernia contributing to shortness of breath. Surgical intervention is contingent on weight loss of 60 pounds. Current medication (naltrexone) is ineffective (not being used in combination with bupropion). Insurance does not cover Zepbound  injection due to lack of qualification under sleep apnea criteria. - Continue documenting the severity of the hiatal hernia to support potential insurance appeals for Zepbound  injection. - Encouraged weight loss to facilitate surgical intervention.   Chronic obstructive pulmonary disease (COPD) COPD with minimal lung scarring, likely post-inflammatory from past pneumonia or COVID-19. Symptoms include coughing, especially postprandial, likely due to reflux. Inhalers are used only during severe coughing episodes. - Continue current inhaler use as needed for severe coughing episodes.  Obesity BMI of 47.9. Weight loss is necessary for surgical intervention of hiatal hernia. Current weight loss efforts are challenging due to  dietary habits and insurance limitations on weight loss medications. - Continue efforts to lose weight to meet surgical requirements for hiatal hernia repair.  Obstructive sleep apnea Present but not severe enough to qualify for Zepbound  injection. Sleep study conducted, but insurance does not cover the injection due to insufficient severity. - Continue documenting sleep apnea severity to support potential insurance appeals for Zepbound  injection.  - Consider repeat sleep study     Advised if symptoms do not improve or worsen, to please contact office for sooner follow up or seek emergency care.    I spent 33 minutes of dedicated to the care of this patient on the date of this encounter to include pre-visit review of records, face-to-face time with the patient discussing conditions above, post visit ordering of testing, clinical documentation with  the electronic health record, making appropriate referrals as documented, and communicating necessary findings to members of the patients care team.     C. Leita Sanders, MD Advanced Bronchoscopy PCCM [facility]    *This note was generated using voice recognition software/Dragon and/or AI transcription program.  Despite best efforts to proofread, errors can occur which can change the meaning. Any transcriptional errors that result from this process are unintentional and may not be fully corrected at the time of dictation.

## 2024-07-21 NOTE — Patient Instructions (Addendum)
 VISIT SUMMARY:  You came in today because you have been experiencing shortness of breath. We discussed your history of COPD and a large hiatal hernia, which you believe is contributing to your symptoms. You also mentioned your challenges with weight loss and managing your sleep apnea.  YOUR PLAN:  -HIATAL HERNIA CAUSING SHORTNESS OF BREATH: A hiatal hernia occurs when part of the stomach pushes up through the diaphragm. Your very large hiatal hernia is contributing to your shortness of breath. We discussed that surgical intervention is needed, but it requires you to lose 60 pounds first. Your current medication (altraxone) is not effective, and insurance does not cover the Zepbound  injection because your sleep apnea is not severe enough. Please continue to document the severity of your hiatal hernia to support potential insurance appeals for the Zepbound  injection. Additionally, continue working on weight loss to facilitate the surgical intervention.  - POSTINFLAMMATORY PULMONARY FIBROSIS: Postinflammatory pulmonary fibrosis shows minimal lung scarring, likely from past pneumonia or COVID-19. You experience coughing, especially after eating, and you use your inhalers only during severe coughing episodes. Please continue using your inhalers as needed for severe coughing.  Your coughing episodes could also be due to your hernia and reflux particularly after meals.  -OBESITY: Obesity is a condition where you have an excessive amount of body fat, which can affect your health. Your BMI is 47, and weight loss is necessary for the surgical intervention of your hiatal hernia. We understand that weight loss is challenging due to your dietary habits and insurance limitations on weight loss medications. Please continue your efforts to lose weight to meet the surgical requirements for hiatal hernia repair.  -OBSTRUCTIVE SLEEP APNEA: Obstructive sleep apnea is a condition where your breathing stops and starts during  sleep. Your sleep apnea is present but not severe enough to qualify for the Zepbound  injection. Please continue to document the severity of your sleep apnea to support potential insurance appeals for the Zepbound  injection.  INSTRUCTIONS:  Please continue documenting the severity of your hiatal hernia and sleep apnea to support potential insurance appeals for the Zepbound  injection. Continue your efforts to lose weight to meet the surgical requirements for hiatal hernia repair. Use your inhalers as needed for severe coughing episodes.

## 2024-07-26 ENCOUNTER — Ambulatory Visit: Admitting: Professional Counselor

## 2024-07-28 ENCOUNTER — Ambulatory Visit (INDEPENDENT_AMBULATORY_CARE_PROVIDER_SITE_OTHER): Admitting: Professional Counselor

## 2024-07-28 DIAGNOSIS — F330 Major depressive disorder, recurrent, mild: Secondary | ICD-10-CM | POA: Diagnosis not present
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
## 2024-08-06 ENCOUNTER — Other Ambulatory Visit: Payer: Self-pay | Admitting: Family Medicine

## 2024-08-11 ENCOUNTER — Ambulatory Visit: Admitting: Psychiatry

## 2024-08-11 ENCOUNTER — Other Ambulatory Visit: Payer: Self-pay

## 2024-08-11 ENCOUNTER — Encounter: Payer: Self-pay | Admitting: Psychiatry

## 2024-08-11 VITALS — BP 127/81 | HR 94 | Temp 97.4°F | Ht 62.0 in | Wt 264.4 lb

## 2024-08-11 DIAGNOSIS — F331 Major depressive disorder, recurrent, moderate: Secondary | ICD-10-CM | POA: Diagnosis not present

## 2024-08-11 DIAGNOSIS — F4310 Post-traumatic stress disorder, unspecified: Secondary | ICD-10-CM

## 2024-08-11 MED ORDER — VENLAFAXINE HCL ER 150 MG PO CP24: 150.0000 mg | ORAL_CAPSULE | Freq: Every day | ORAL | 2 refills | Status: AC

## 2024-08-11 MED ORDER — VENLAFAXINE HCL ER 37.5 MG PO CP24: 37.5000 mg | ORAL_CAPSULE | Freq: Every day | ORAL | 1 refills | Status: DC

## 2024-08-11 NOTE — Patient Instructions (Signed)
 Increase venlafaxine  187.5 mg daily  Next appointment: 1/27 at 11 am
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
## 2024-08-18 ENCOUNTER — Ambulatory Visit: Admitting: Professional Counselor

## 2024-08-18 DIAGNOSIS — F330 Major depressive disorder, recurrent, mild: Secondary | ICD-10-CM | POA: Diagnosis not present

## 2024-08-18 NOTE — Progress Notes (Signed)
°  THERAPIST PROGRESS NOTE  Session Time: 3:55 PM - 4:29 PM  Participation Level: Active  Behavioral Response: Well Groomed, Alert, Dysphoric  Type of Therapy: Individual Therapy  Treatment Goals addressed: Active Depression  LTG: I guess when things get to where I can say what I want to say. Stand up for myself. Accept and know I can't change her.  (Progressing)                Start:  02/15/24    Expected End:  02/13/25    STG: To improve assertiveness AEB utilizing interpersonal effectiveness skills over the next 90 days.  (Progressing)  Goal Note Reviewed 05/10/24 I've got this neighbor. She doesn't speak English very much. When she comes visit, she just walks right in. It happened a lot when I was sick, I just left the door unlocked, but now she just walks right in. I told her she's gonna have to ring the doorbell. This was yesterday.    STG: To reduce judgementalness AEB practicing mindfulness skills and focusing on what's within Tallie's control over the next 90 days. (Progressing)  Goal Note Reviewed 05/10/24 I'm ill with the cat again and I'm ill with the lady who works in the [employer].    STG: To improve memory AEB utilizing mindfulness skills over the next 12 weeks (Initial)  ProgressTowards Goals: Progressing  Interventions: CBT, Motivational Interviewing, and Supportive  Summary: Shantelle Alles is a 73 y.o. female who presents with a history of depression and trauma. She appeared alert and oriented x5. She shared updates with family and how she was okay with not having to get together recently. Rosene discussed the need to lose weight and explored ways to work on this. She noted low mood since her cat passed and expressed missing companionship. Francy engaged in location manager pros and cons of getting another cat. She will wait another month to see how she adjusts before making a decision.   Therapist Response: Conducted session with Kyra. Began session with check-in/update  since previous session. Utilized empathetic and reflective listening. Used open-ended questions to facilitate discussion and summarized Baudelia's thoughts/feelings. Explored ways to help with weight loss. Weighed pros/cons of getting another pet. Normalized adjustment period and feelings during this time. Scheduled additional appointment and concluded session.   Suicidal/Homicidal: No  Plan: Return again in 5 weeks.  Diagnosis: MDD (major depressive disorder), recurrent episode, mild  Collaboration of Care: Medication Management AEB chart review  Patient/Guardian was advised Release of Information must be obtained prior to any record release in order to collaborate their care with an outside provider. Patient/Guardian was advised if they have not already done so to contact the registration department to sign all necessary forms in order for us  to release information regarding their care.   Consent: Patient/Guardian gives verbal consent for treatment and assignment of benefits for services provided during this visit. Patient/Guardian expressed understanding and agreed to proceed.   Almarie JONETTA Ligas, [facility] 08/18/2024

## 2024-08-19 ENCOUNTER — Other Ambulatory Visit: Payer: Self-pay

## 2024-08-19 DIAGNOSIS — N3941 Urge incontinence: Secondary | ICD-10-CM

## 2024-08-19 MED ORDER — TRIMETHOPRIM 100 MG PO TABS: 100.0000 mg | ORAL_TABLET | Freq: Every day | ORAL | 4 refills | Status: DC

## 2024-08-19 NOTE — Telephone Encounter (Signed)
 Rx refill request approved 30 day supply 4 refills. See previous encounter

## 2024-08-25 ENCOUNTER — Ambulatory Visit: Admitting: Psychiatry

## 2024-08-29 ENCOUNTER — Ambulatory Visit: Admitting: Family Medicine

## 2024-08-29 ENCOUNTER — Encounter: Payer: Self-pay | Admitting: Family Medicine

## 2024-08-29 VITALS — BP 134/76 | HR 85 | Temp 98.1°F | Ht 62.0 in | Wt 262.9 lb

## 2024-08-29 DIAGNOSIS — Z1231 Encounter for screening mammogram for malignant neoplasm of breast: Secondary | ICD-10-CM | POA: Diagnosis not present

## 2024-08-29 DIAGNOSIS — J9611 Chronic respiratory failure with hypoxia: Secondary | ICD-10-CM

## 2024-08-29 DIAGNOSIS — Z780 Asymptomatic menopausal state: Secondary | ICD-10-CM | POA: Diagnosis not present

## 2024-08-29 DIAGNOSIS — M1610 Unilateral primary osteoarthritis, unspecified hip: Secondary | ICD-10-CM

## 2024-08-29 DIAGNOSIS — M810 Age-related osteoporosis without current pathological fracture: Secondary | ICD-10-CM

## 2024-08-29 DIAGNOSIS — J8410 Pulmonary fibrosis, unspecified: Secondary | ICD-10-CM | POA: Diagnosis not present

## 2024-08-29 DIAGNOSIS — R262 Difficulty in walking, not elsewhere classified: Secondary | ICD-10-CM

## 2024-08-29 DIAGNOSIS — Z13820 Encounter for screening for osteoporosis: Secondary | ICD-10-CM

## 2024-08-29 DIAGNOSIS — M47816 Spondylosis without myelopathy or radiculopathy, lumbar region: Secondary | ICD-10-CM

## 2024-08-29 NOTE — Progress Notes (Signed)
 "     Established patient visit   Patient: Faith Padilla   DOB: 1950/10/08   73 y.o. Female  MRN: [id number] Visit Date: 08/29/2024  Today's healthcare provider: LAURAINE LOISE BUOY, DO   Chief Complaint  Patient presents with   Medical Management of Chronic Issues    Patient is here to follow up on weight loss states that she was put on Naltrexone  at last visit however she is no longer taking it, stated that she did not refill it when she ran out.  Reports that it made her nervous and would make her hands jitter.   Subjective    HPI Faith Padilla is a 73 year old female with osteoarthritis and sleep apnea who presents with concerns about bone density and mobility issues.  She stopped taking naltrexone  about a week and a half ago due to feeling nervous and jittery, without noticing any benefit from it.  She has a history of osteoarthritis, which necessitated a hip replacement due to complete degeneration of the hip. She experiences balance issues and uses a cane for support. She has had a fall when a chair slipped out from under her but has not had any other significant falls. She occasionally stumbles and feels the cane helps prevent falls. She previously used a walker post-surgery but discontinued it once she was able to walk with her hip. She experiences difficulty with mobility, stating that she gets out of breath easily and struggles to prepare meals due to the need to stand for extended periods.  She has been diagnosed with sleep apnea. She does not use CPAP at night and has oxygen at home but does not use it. She uses inhalers as needed but not regularly due to limited physical activity.  She has concerns about bone density. Her daughter has been encouraging her to get a bone density test. She has been diagnosed with osteoarthritis.  She lives in a community for elderly and handicapped individuals, which offers meals and activities. She participates in some activities but not all. The  meals provided are generally healthy, avoiding sweets and fried foods.  She takes vitamin D  but is unsure of the dosage. She does not take vitamin C and mentions limited sun exposure as a reason for taking vitamin D . She has a history of hernia and sought a second opinion for surgery, but was advised against it due to potential complications and the need for repeat surgeries. She was previously advised to lose weight to address the hernia issue.  She experiences memory issues, with some moments being better than others. She engages in activities like crocheting to keep her mind active.       Medications: Show/hide medication list[1]       Objective    BP 134/76 (BP Location: Right Arm, Patient Position: Sitting) Comment (BP Location): Forearm  Pulse 85   Temp 98.1 F (36.7 C) (Oral)   Ht 5' 2 (1.575 m)   Wt 262 lb 14.4 oz (119.3 kg)   SpO2 98%   BMI 48.09 kg/m     Physical Exam Vitals and nursing note reviewed.  Constitutional:      General: She is not in acute distress.    Appearance: Normal appearance.  HENT:     Head: Normocephalic and atraumatic.  Eyes:     General: No scleral icterus.    Conjunctiva/sclera: Conjunctivae normal.  Cardiovascular:     Rate and Rhythm: Normal rate.  Pulmonary:     Effort: Pulmonary effort  is normal.  Neurological:     Mental Status: She is alert and oriented to person, place, and time. Mental status is at baseline.     Motor: Weakness (at baseline; uses cane) present.  Psychiatric:        Mood and Affect: Mood normal.        Behavior: Behavior normal.      No results found for any visits on 08/29/24.  Assessment & Plan    Ambulatory dysfunction -     For home use only DME 4 wheeled rolling walker with seat  Chronic hypoxemic respiratory failure (HCC) -     For home use only DME 4 wheeled rolling walker with seat  Encounter for osteoporosis screening in asymptomatic postmenopausal patient -     DG Bone Density;  Future  Encounter for screening mammogram for breast cancer -     3D Screening Mammogram, Left and Right; Future  Primary osteoarthritis of hip, unspecified laterality -     For home use only DME 4 wheeled rolling walker with seat  Age-related osteoporosis without current pathological fracture  Pulmonary fibrosis (HCC) Assessment & Plan: Noted. Patient endorses intermittent use of inhalers. Managed by pulmonology; defer to specialist management.    Chronic hypoxemic respiratory failure Experiences shortness of breath with minimal exertion. Inconsistent use of inhalers and home oxygen. - Encouraged regular use of inhalers as prescribed. - Discussed importance of using oxygen therapy to improve oxygenation.  Ambulatory dysfunction Difficulty with balance and mobility, uses a cane. Previously used a rollator but discontinued.  Currently needs a rollator again due to significant mobility issues as well as severe dyspnea on exertion.  She needs to be able to have a place to sit and rest quickly when she becomes fatigued.  A traditional walker is not sufficient for this purpose. - Ordered a rollator for additional support and reassurance. - Encouraged rollator use for stability and increased activity.  Primary osteoarthritis of the hip, unspecified laterality Degenerative arthritis requiring hip replacement. Uses a cane for support due to balance issues. - Encouraged mobility and rollator use to support balance and reduce hip strain. - Ordered DME rollator for home use as noted above.  Age-related osteoporosis without current pathologic fracture Osteoporosis with history of hip replacement. No recent bone density test. - Ordered bone density test to assess bone health. - Advised 1200 mg calcium  daily for bone health. - Consider restarting Fosamax pending results of bone density test - Encouraged weight-bearing exercises to improve bone density.  General Health Maintenance Has not had  a recent mammogram due to discomfort and scheduling issues. - Left mammogram order in place for scheduling with bone density test.    Return in about 3 months (around 11/27/2024) for Chronic f/u, Weight, and in 6 months with Janna Ostwalt.      I discussed the assessment and treatment plan with the patient  The patient was provided an opportunity to ask questions and all were answered. The patient agreed with the plan and demonstrated an understanding of the instructions.   The patient was advised to call back or seek an in-person evaluation if the symptoms worsen or if the condition fails to improve as anticipated.    LAURAINE LOISE BUOY, DO  [facility] [phone] (phone) [phone] (fax)  [facility]     [1]  Outpatient Medications Prior to Visit  Medication Sig Note   atorvastatin  (LIPITOR) 20 MG tablet TAKE 1 TABLET BY MOUTH EVERY DAY  Biotin 1 MG CAPS Take 1 capsule by mouth daily.    budesonide -formoterol  (SYMBICORT ) 160-4.5 MCG/ACT inhaler Inhale 2 puffs into the lungs 2 (two) times daily. (Patient taking differently: Inhale 2 puffs into the lungs 2 (two) times daily. Taking PRN)    CALCIUM  PO Take by mouth.    cetirizine  (ZYRTEC ) 10 MG tablet TAKE 1 TABLET BY MOUTH EVERY DAY    clotrimazole -betamethasone  (LOTRISONE ) cream Apply 1 Application topically 2 (two) times daily. (Patient not taking: Reported on 09/07/2024)    ipratropium (ATROVENT ) 0.03 % nasal spray PLACE 2 SPRAYS INTO BOTH NOSTRILS EVERY 12 HOURS USE IN PLACE OF FLONASE     ipratropium-albuterol  (DUONEB) 0.5-2.5 (3) MG/3ML SOLN Take 3 mLs by nebulization every 4 (four) hours as needed.    levothyroxine  (SYNTHROID ) 75 MCG tablet Take 1 tablet (75 mcg total) by mouth daily before breakfast.    meloxicam  (MOBIC ) 15 MG tablet Take 1 tablet (15 mg total) by mouth daily.    Misc Natural Products (TURMERIC, CURCUMIN, PO) Take by mouth.    montelukast  (SINGULAIR ) 10 MG tablet  TAKE 1 TABLET(10 MG) BY MOUTH AT BEDTIME    nystatin  (MYCOSTATIN /NYSTOP ) powder Apply 1 Application topically 3 (three) times daily. (Patient not taking: Reported on 09/07/2024)    omeprazole  (PRILOSEC) 20 MG capsule TAKE 1 CAPSULE(20 MG) BY MOUTH TWICE DAILY BEFORE A MEAL    oxybutynin  (DITROPAN -XL) 10 MG 24 hr tablet Take 1 tablet (10 mg total) by mouth daily.    trimethoprim  (TRIMPEX ) 100 MG tablet Take 1 tablet (100 mg total) by mouth daily.    [START ON 09/20/2024] venlafaxine  XR (EFFEXOR -XR) 150 MG 24 hr capsule Take 1 capsule (150 mg total) by mouth daily with breakfast.    venlafaxine  XR (EFFEXOR -XR) 37.5 MG 24 hr capsule Take 1 capsule (37.5 mg total) by mouth daily with breakfast. Total of 187.5 mg daily. Take along with 150 mg cap    VITAMIN D  PO Take by mouth.    [DISCONTINUED] naltrexone  (DEPADE) 50 MG tablet Take 0.5-1 tablets (25-50 mg total) by mouth daily. 08/29/2024: Made her nervous   Facility-Administered Medications Prior to Visit  Medication Dose Route Frequency Provider   albuterol  (PROVENTIL ) (2.5 MG/3ML) 0.083% nebulizer solution 2.5 mg  2.5 mg Nebulization Q4H Cobb, Comer GAILS, NP   "

## 2024-08-29 NOTE — Assessment & Plan Note (Signed)
 Noted. Patient endorses intermittent use of inhalers. Managed by pulmonology; defer to specialist management.

## 2024-08-29 NOTE — Patient Instructions (Signed)
 Please call the [facility] [phone]) to schedule a routine screening mammogram.

## 2024-09-07 ENCOUNTER — Ambulatory Visit: Payer: Self-pay | Admitting: Emergency Medicine

## 2024-09-07 VITALS — Ht 62.0 in | Wt 262.0 lb

## 2024-09-07 DIAGNOSIS — Z0000 Encounter for general adult medical examination without abnormal findings: Secondary | ICD-10-CM | POA: Diagnosis not present

## 2024-09-07 NOTE — Patient Instructions (Signed)
 Faith Padilla,  Thank you for taking the time for your Medicare Wellness Visit. I appreciate your continued commitment to your health goals. Please review the care plan we discussed, and feel free to reach out if I can assist you further.  Please note that Annual Wellness Visits do not include a physical exam. Some assessments may be limited, especially if the visit was conducted virtually. If needed, we may recommend an in-person follow-up with your provider.  Ongoing Care Seeing your primary care provider every 3 to 6 months helps us  monitor your health and provide consistent, personalized care.    Referrals If a referral was made during today's visit and you haven't received any updates within two weeks, please contact the referred provider directly to check on the status.  Recommended Screenings:  Get the 2nd Shingles vaccine at your local pharmacy at your earliest convenience.  Please call to schedule your mammogram and bone density scan:  [facility] Address: [address], [place], [place] Phone: [phone]    Health Maintenance  Topic Date Due   Osteoporosis screening with Bone Density Scan  Never done   Medicare Annual Wellness Visit  08/25/2024   Zoster (Shingles) Vaccine (1 of 2) 09/09/2024*   Breast Cancer Screening  10/29/2024*   COVID-19 Vaccine (3 - Moderna risk series) 05/26/2025*   Colon Cancer Screening  12/17/2026   DTaP/Tdap/Td vaccine (4 - Td or Tdap) 02/23/2031   Pneumococcal Vaccine for age over 70  Completed   Flu Shot  Completed   Hepatitis C Screening  Completed   Meningitis B Vaccine  Aged Out  *Topic was postponed. The date shown is not the original due date.       09/07/2024    9:08 AM  Advanced Directives  Does Patient Have a Medical Advance Directive? No  Would patient like information on creating a medical advance directive? No - Patient declined    Vision: Annual vision screenings are  recommended for early detection of glaucoma, cataracts, and diabetic retinopathy. These exams can also reveal signs of chronic conditions such as diabetes and high blood pressure.  Dental: Annual dental screenings help detect early signs of oral cancer, gum disease, and other conditions linked to overall health, including heart disease and diabetes.  Please see the attached documents for additional preventive care recommendations.

## 2024-09-07 NOTE — Progress Notes (Signed)
 "  Chief Complaint  Patient presents with   Medicare Wellness     Subjective:   Faith Padilla is a 73 y.o. female who presents for a Medicare Annual Wellness Visit.  Visit info / Clinical Intake: Medicare Wellness Visit Type:: Subsequent Annual Wellness Visit Persons participating in visit and providing information:: patient Medicare Wellness Visit Mode:: Telephone If telephone:: video error Since this visit was completed virtually, some vitals may be partially provided or unavailable. Missing vitals are due to the limitations of the virtual format.: Documented vitals are patient reported If Telephone or Video please confirm:: I connected with patient using audio/video enable telemedicine. I verified patient identity with two identifiers, discussed telehealth limitations, and patient agreed to proceed. Patient Location:: home Provider Location:: home office Interpreter Needed?: No Pre-visit prep was completed: yes AWV questionnaire completed by patient prior to visit?: yes Date:: 09/07/24 Living arrangements:: (!) lives alone Patient's Overall Health Status Rating: good Typical amount of pain: some Does pain affect daily life?: (!) yes Are you currently prescribed opioids?: no  Dietary Habits and Nutritional Risks How many meals a day?: 3 Eats fruit and vegetables daily?: (!) no Most meals are obtained by: preparing own meals In the last 2 weeks, have you had any of the following?: none Diabetic:: no  Functional Status Activities of Daily Living (to include ambulation/medication): Independent Ambulation: Independent with device- listed below Home Assistive Devices/Equipment: Cane; Eyeglasses; CPAP Medication Administration: Independent Home Management (perform basic housework or laundry): Independent Manage your own finances?: yes Primary transportation is: driving Concerns about vision?: (!) yes (blurry vision at times) Concerns about hearing?: no  Fall Screening Falls  in the past year?: 1 Number of falls in past year: 0 Was there an injury with Fall?: 0 Fall Risk Category Calculator: 1 Patient Fall Risk Level: Low Fall Risk  Fall Risk Patient at Risk for Falls Due to: History of fall(s); Impaired balance/gait; Impaired mobility Fall risk Follow up: Falls evaluation completed; Education provided; Falls prevention discussed  Home and Transportation Safety: All rugs have non-skid backing?: N/A, no rugs All stairs or steps have railings?: N/A, no stairs Grab bars in the bathtub or shower?: yes Have non-skid surface in bathtub or shower?: yes Good home lighting?: yes Regular seat belt use?: yes Hospital stays in the last year:: no  Cognitive Assessment Difficulty concentrating, remembering, or making decisions? : yes Will 6CIT or Mini Cog be Completed: yes What year is it?: 0 points What month is it?: 0 points Give patient an address phrase to remember (5 components): [address] [place] About what time is it?: 0 points Count backwards from 20 to 1: 0 points Say the months of the year in reverse: 0 points Repeat the address phrase from earlier: 0 points 6 CIT Score: 0 points  Advance Directives (For Healthcare) Does Patient Have a Medical Advance Directive?: No Would patient like information on creating a medical advance directive?: No - Patient declined  Reviewed/Updated  Reviewed/Updated: Reviewed All (Medical, Surgical, Family, Medications, Allergies, Care Teams, Patient Goals)    Allergies (verified) Morphine   Current Medications (verified) Outpatient Encounter Medications as of 09/07/2024  Medication Sig   acetaminophen  (TYLENOL ) 650 MG CR tablet Take 1,300 mg by mouth 2 (two) times daily.   atorvastatin  (LIPITOR) 20 MG tablet TAKE 1 TABLET BY MOUTH EVERY DAY   Biotin 1 MG CAPS Take 1 capsule by mouth daily.   budesonide -formoterol  (SYMBICORT ) 160-4.5 MCG/ACT inhaler Inhale 2 puffs into the lungs 2 (two) times  daily. (Patient  taking differently: Inhale 2 puffs into the lungs 2 (two) times daily. Taking PRN)   CALCIUM  PO Take by mouth.   cetirizine  (ZYRTEC ) 10 MG tablet TAKE 1 TABLET BY MOUTH EVERY DAY   ipratropium (ATROVENT ) 0.03 % nasal spray PLACE 2 SPRAYS INTO BOTH NOSTRILS EVERY 12 HOURS USE IN PLACE OF FLONASE    ipratropium-albuterol  (DUONEB) 0.5-2.5 (3) MG/3ML SOLN Take 3 mLs by nebulization every 4 (four) hours as needed.   levothyroxine  (SYNTHROID ) 75 MCG tablet Take 1 tablet (75 mcg total) by mouth daily before breakfast.   Melatonin 10 MG TABS Take 1 tablet by mouth at bedtime.   meloxicam  (MOBIC ) 15 MG tablet Take 1 tablet (15 mg total) by mouth daily.   Misc Natural Products (TURMERIC, CURCUMIN, PO) Take by mouth.   montelukast  (SINGULAIR ) 10 MG tablet TAKE 1 TABLET(10 MG) BY MOUTH AT BEDTIME   omeprazole  (PRILOSEC) 20 MG capsule TAKE 1 CAPSULE(20 MG) BY MOUTH TWICE DAILY BEFORE A MEAL   oxybutynin  (DITROPAN -XL) 10 MG 24 hr tablet Take 1 tablet (10 mg total) by mouth daily.   trimethoprim  (TRIMPEX ) 100 MG tablet Take 1 tablet (100 mg total) by mouth daily.   [START ON 09/20/2024] venlafaxine  XR (EFFEXOR -XR) 150 MG 24 hr capsule Take 1 capsule (150 mg total) by mouth daily with breakfast.   venlafaxine  XR (EFFEXOR -XR) 37.5 MG 24 hr capsule Take 1 capsule (37.5 mg total) by mouth daily with breakfast. Total of 187.5 mg daily. Take along with 150 mg cap   VITAMIN D  PO Take by mouth.   clotrimazole -betamethasone  (LOTRISONE ) cream Apply 1 Application topically 2 (two) times daily. (Patient not taking: Reported on 09/07/2024)   nystatin  (MYCOSTATIN /NYSTOP ) powder Apply 1 Application topically 3 (three) times daily. (Patient not taking: Reported on 09/07/2024)   Facility-Administered Encounter Medications as of 09/07/2024  Medication   albuterol  (PROVENTIL ) (2.5 MG/3ML) 0.083% nebulizer solution 2.5 mg    History: Past Medical History:  Diagnosis Date   Abdominal pain, right upper quadrant    Abdominal  pain, RUQ (right upper quadrant) 03/12/2016   Achilles tendinitis of right lower extremity 06/25/2023   Acid reflux    Allergic conjunctivitis of both eyes 06/29/2017   Allergy     Anemia 12/04/2019   Anxiety and depression    Asthma    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) 08/13/2021   Complication of anesthesia    slow to awaken, wakes coughing and SOB   COPD (chronic obstructive pulmonary disease) (HCC)    Depression    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   Diverticulitis    Dry eye syndrome of both eyes 08/13/2021   Dyspnea on exertion 08/13/2021   Family history of problems related to stress 11/15/2021   Frequency of urination and polyuria 06/25/2022   GAD (generalized anxiety disorder) 08/15/2021   Glaucoma    H/O urinary incontinence 06/25/2022   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   High risk medication use 08/02/2018   HLD (hyperlipidemia)    Iron deficiency 12/04/2019   Isolation (social) 12/07/2021   Kidney stones    Microscopic hematuria    Mixed incontinence    Morbid obesity (HCC)    Ocular hypertension, 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   Ocular migraine 08/02/2018   Osteoarthritis    lumbar spine   Osteoarthritis of right knee 08/28/2023   Palpitations 08/13/2021   Plantar fasciitis 06/25/2023   Post covid-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, 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   Postoperative follow-up 03/30/2020   Prediabetes  08/14/2015   Pseudophakia, both eyes 06/26/2016   Pulmonary fibrosis (HCC) 2020   Rectal bleeding 10/17/2021   Shortness of breath dyspnea    Sleep apnea    CPAP   Thyroid  disease    Vaginal yeast infection    Wears dentures    partial upper   Past Surgical History:  Procedure Laterality Date   ABDOMINAL HYSTERECTOMY  09/08/1996   BACK SURGERY  1990/2014   CATARACT EXTRACTION, BILATERAL  09/08/2012   COLONOSCOPY N/A 12/17/2023   Procedure: COLONOSCOPY;  Surgeon: Jinny Carmine, MD;   Location: [facility] ENDOSCOPY;  Service: Endoscopy;  Laterality: N/A;   COLONOSCOPY WITH PROPOFOL  N/A 03/25/2016   Procedure: COLONOSCOPY WITH PROPOFOL ;  Surgeon: Carmine Jinny, MD;  Location: [facility] ENDOSCOPY;  Service: Endoscopy;  Laterality: N/A;   deviated nose septum surgery  09/08/1998   ESOPHAGOGASTRODUODENOSCOPY N/A 06/02/2024   Procedure: EGD (ESOPHAGOGASTRODUODENOSCOPY);  Surgeon: Jinny Carmine, MD;  Location: [facility] ENDOSCOPY;  Service: Endoscopy;  Laterality: N/A;   ESOPHAGOGASTRODUODENOSCOPY (EGD) WITH PROPOFOL  N/A 03/25/2016   Procedure: ESOPHAGOGASTRODUODENOSCOPY (EGD) WITH PROPOFOL ;  Surgeon: Carmine Jinny, MD;  Location: [facility] ENDOSCOPY;  Service: Endoscopy;  Laterality: N/A;   EYE SURGERY     JOINT REPLACEMENT     LITHOTRIPSY  09/08/2010   POLYPECTOMY  12/17/2023   Procedure: POLYPECTOMY, INTESTINE;  Surgeon: Jinny Carmine, MD;  Location: [facility] ENDOSCOPY;  Service: Endoscopy;;   SPINE SURGERY     TOTAL HIP ARTHROPLASTY Left    Family History  Problem Relation Age of Onset   Depression Mother    Asthma Mother    Heart disease Mother    Diabetes Mother    Cancer Father        lung cancer   COPD Father    Liver cancer Father    Cancer Sister        Brain Cancer   Allergic rhinitis Sister    Asthma Sister    Depression Daughter    Hematuria Daughter    Bladder Cancer Neg Hx    Social History   Occupational History   Occupation: retired  Tobacco Use   Smoking status: Never    Passive exposure: Never (Parent)   Smokeless tobacco: Never  Vaping Use   Vaping status: Never Used  Substance and Sexual Activity   Alcohol use: Never   Drug use: Never   Sexual activity: Not Currently    Birth control/protection: Surgical   Tobacco Counseling Counseling given: Not Answered  SDOH Screenings   Food Insecurity: No Food Insecurity (09/07/2024)  Housing: Low Risk (09/07/2024)  Transportation Needs: No Transportation Needs (09/07/2024)  Utilities: Not At Risk (09/07/2024)   Alcohol Screen: Low Risk (08/25/2023)  Depression (PHQ2-9): Low Risk (09/07/2024)  Recent Concern: Depression (PHQ2-9) - Medium Risk (08/29/2024)  Financial Resource Strain: Low Risk (07/07/2024)  Physical Activity: Inactive (09/07/2024)  Social Connections: Socially Isolated (09/07/2024)  Stress: No Stress Concern Present (09/07/2024)  Tobacco Use: Low Risk (09/07/2024)  Health Literacy: Adequate Health Literacy (09/07/2024)   See flowsheets for full screening details  Depression Screen PHQ 2 & 9 Depression Scale- Over the past 2 weeks, how often have you been bothered by any of the following problems? Little interest or pleasure in doing things: 0 Feeling down, depressed, or hopeless (PHQ Adolescent also includes...irritable): 0 PHQ-2 Total Score: 0 Trouble falling or staying asleep, or sleeping too much: 0 Feeling tired or having little energy: 3 Poor appetite or overeating (PHQ Adolescent also includes...weight loss): 0 Feeling bad about yourself - or that  you are a failure or have let yourself or your family down: 0 Trouble concentrating on things, such as reading the newspaper or watching television (PHQ Adolescent also includes...like school work): 1 Moving or speaking so slowly that other people could have noticed. Or the opposite - being so fidgety or restless that you have been moving around a lot more than usual: 0 Thoughts that you would be better off dead, or of hurting yourself in some way: 0 PHQ-9 Total Score: 4 If you checked off any problems, how difficult have these problems made it for you to do your work, take care of things at home, or get along with other people?: Somewhat difficult  Depression Treatment Depression Interventions/Treatment : Currently on Treatment     Goals Addressed               This Visit's Progress     COMPLETED: Patient Stated (pt-stated)        Complete pulmonary rehab      Weight (lb) < 200 lb (90.7 kg) (pt-stated)   262 lb  (118.8 kg)            Objective:    Today's Vitals   09/07/24 0900  Weight: 262 lb (118.8 kg)  Height: 5' 2 (1.575 m)   Body mass index is 47.92 kg/m.  Hearing/Vision screen Hearing Screening - Comments:: Denies hearing loss  Vision Screening - Comments:: UTD w/ [facility] Immunizations and Health Maintenance Health Maintenance  Topic Date Due   Bone Density Scan  Never done   Zoster Vaccines- Shingrix (1 of 2) 09/09/2024 (Originally 09/04/1970)   Mammogram  10/29/2024 (Originally 01/03/2017)   COVID-19 Vaccine (3 - Moderna risk series) 05/26/2025 (Originally 04/13/2020)   Medicare Annual Wellness (AWV)  09/07/2025   Colonoscopy  12/17/2026   DTaP/Tdap/Td (4 - Td or Tdap) 02/23/2031   Pneumococcal Vaccine: 50+ Years  Completed   Influenza Vaccine  Completed   Hepatitis C Screening  Completed   Meningococcal B Vaccine  Aged Out        Assessment/Plan:  This is a routine wellness examination for Faith Padilla.  Patient Care Team: Donzella Lauraine SAILOR, DO as PCP - General (Family Medicine) Kassie Acquanetta Bradley, MD as Consulting Physician (Pulmonary Disease) Gaston Hamilton, MD as Consulting Physician (Urology) Shilt, Norleen Barter, PA-C (Orthopedic Surgery) Tobie Arleta SQUIBB, MD as Consulting Physician (Allergy  and Immunology) [facility], [facility] (Optometry) Jinny Carmine, MD as Consulting Physician (Gastroenterology) Veva Almarie BIRCH, [facility] as Social Worker (Professional Counselor) Vickey Mettle, MD as Consulting Physician (Psychiatry)  I have personally reviewed and noted the following in the patients chart:   Medical and social history Use of alcohol, tobacco or illicit drugs  Current medications and supplements including opioid prescriptions. Functional ability and status Nutritional status Physical activity Advanced directives List of other physicians Hospitalizations, surgeries, and ER visits in previous 12 months Vitals Screenings to include  cognitive, depression, and falls Referrals and appointments  No orders of the defined types were placed in this encounter.  In addition, I have reviewed and discussed with patient certain preventive protocols, quality metrics, and best practice recommendations. A written personalized care plan for preventive services as well as general preventive health recommendations were provided to patient.   Faith Padilla, CMA   09/07/2024   Return in 53 weeks (on 09/13/2025) for Medicare Annual Wellness Visit.  After Visit Summary: (MyChart) Due to this being a telephonic visit, the after visit summary with patients personalized plan  was offered to patient via MyChart   Nurse Notes:  Gave ph# to schedule MMG and DEXA (ordered 08/29/24) Needs 2nd Shingrix vaccine (pharmacy) Covid vaccine UTD per patient Screening colonoscopy no longer recommended by GI due to age.  "

## 2024-09-09 ENCOUNTER — Emergency Department
Admission: EM | Admit: 2024-09-09 | Discharge: 2024-09-09 | Disposition: A | Discharge status: Home / Self Care | Attending: Emergency Medicine | Admitting: Emergency Medicine

## 2024-09-09 ENCOUNTER — Emergency Department

## 2024-09-09 ENCOUNTER — Ambulatory Visit: Payer: Self-pay | Admitting: Sleep Medicine

## 2024-09-09 ENCOUNTER — Other Ambulatory Visit: Payer: Self-pay

## 2024-09-09 DIAGNOSIS — J449 Chronic obstructive pulmonary disease, unspecified: Secondary | ICD-10-CM | POA: Diagnosis not present

## 2024-09-09 DIAGNOSIS — J441 Chronic obstructive pulmonary disease with (acute) exacerbation: Secondary | ICD-10-CM

## 2024-09-09 DIAGNOSIS — R0602 Shortness of breath: Secondary | ICD-10-CM | POA: Diagnosis present

## 2024-09-09 DIAGNOSIS — R0600 Dyspnea, unspecified: Secondary | ICD-10-CM

## 2024-09-09 LAB — CBC
HCT: 43 % (ref 36.0–46.0)
Hemoglobin: 14 g/dL (ref 12.0–15.0)
MCH: 31 pg (ref 26.0–34.0)
MCHC: 32.6 g/dL (ref 30.0–36.0)
MCV: 95.3 fL (ref 80.0–100.0)
Platelets: 307 K/uL (ref 150–400)
RBC: 4.51 MIL/uL (ref 3.87–5.11)
RDW: 13.1 % (ref 11.5–15.5)
WBC: 7.6 K/uL (ref 4.0–10.5)
nRBC: 0 % (ref 0.0–0.2)

## 2024-09-09 LAB — BASIC METABOLIC PANEL WITH GFR
Anion gap: 11 (ref 5–15)
BUN: 19 mg/dL (ref 8–23)
CO2: 24 mmol/L (ref 22–32)
Calcium: 10.1 mg/dL (ref 8.9–10.3)
Chloride: 104 mmol/L (ref 98–111)
Creatinine, Ser: 1 mg/dL (ref 0.44–1.00)
GFR, Estimated: 59 mL/min — ABNORMAL LOW
Glucose, Bld: 132 mg/dL — ABNORMAL HIGH (ref 70–99)
Potassium: 4.8 mmol/L (ref 3.5–5.1)
Sodium: 139 mmol/L (ref 135–145)

## 2024-09-09 LAB — TROPONIN T, HIGH SENSITIVITY: Troponin T High Sensitivity: <15 ng/L (ref 0–19)

## 2024-09-09 LAB — RESP PANEL BY RT-PCR (RSV, FLU A&B, COVID)  RVPGX2
Influenza A by PCR: NEGATIVE
Influenza B by PCR: NEGATIVE
Resp Syncytial Virus by PCR: NEGATIVE
SARS Coronavirus 2 by RT PCR: NEGATIVE

## 2024-09-09 MED ORDER — IPRATROPIUM-ALBUTEROL 0.5-2.5 (3) MG/3ML IN SOLN
3.0000 mL | Freq: Once | RESPIRATORY_TRACT | Status: AC
  Administered 2024-09-09: 3 mL via RESPIRATORY_TRACT
  Filled 2024-09-09: qty 3

## 2024-09-09 MED ORDER — METHYLPREDNISOLONE SODIUM SUCC 125 MG IJ SOLR
125.0000 mg | Freq: Once | INTRAMUSCULAR | Status: DC
  Filled 2024-09-09: qty 2

## 2024-09-09 MED ORDER — DEXAMETHASONE SOD PHOSPHATE PF 10 MG/ML IJ SOLN
10.0000 mg | Freq: Once | INTRAMUSCULAR | Status: AC
  Administered 2024-09-09: 10 mg via INTRAMUSCULAR

## 2024-09-09 NOTE — Discharge Instructions (Signed)
 You have been given a one-time dose of steroids in the emergency department.  Please continue to use your inhalers/nebulizers at home.  As we discussed you may try over-the-counter liquid Maalox max (Maalox plus simethicone ) if needed for hiatal hernia discomfort.  Return to the emergency department for any significant shortness of breath any chest pain or any other symptom personally concerning to yourself.

## 2024-09-09 NOTE — Telephone Encounter (Signed)
 Noted, patient will have daughter transport to ED.  Nothing further needed.

## 2024-09-09 NOTE — Telephone Encounter (Signed)
 Declined EMS. Will call daughter to transport her to the ED for worsening SOB, even on 2L O2.  FYI Only or Action Required?: Action required by provider: update on patient condition.  Patient is followed in Pulmonology for extensive pulm history, last seen on 07/21/2024 by Tamea Dedra CROME, MD.  Called Nurse Triage reporting Shortness of Breath.  Symptoms began several weeks ago.  Interventions attempted: Home oxygen use.  Symptoms are: gradually worsening.  Triage Disposition: Go to ED Now (Notify PCP)  Patient/caregiver understands and will follow disposition?:   Copied from CRM [id number]. Topic: Clinical - Red Word Triage >> Sep 09, 2024 10:20 AM Isabell A wrote: Red Word that prompted transfer to Nurse Triage: Having breathing issues, no energy,SOB Reason for Disposition  [1] MODERATE difficulty breathing (e.g., speaks in phrases, SOB even at rest, pulse 100-120) AND [2] NEW-onset or WORSE than normal  Answer Assessment - Initial Assessment Questions 1. RESPIRATORY STATUS: Describe your breathing? (e.g., wheezing, shortness of breath, unable to speak, severe coughing)      Everything takes an effort, pt noticeably short of breath on the phone, speaking in phrases  2. ONSET: When did this breathing problem begin?      Last couple weeks  3. PATTERN Does the difficult breathing come and go, or has it been constant since it started?      Constant when she gets up, resolves at rest  4. SEVERITY: How bad is your breathing? (e.g., mild, moderate, severe)      Severe  5. RECURRENT SYMPTOM: Have you had difficulty breathing before? If Yes, ask: When was the last time? and What happened that time?      Yes  6. CARDIAC HISTORY: Do you have any history of heart disease? (e.g., heart attack, angina, bypass surgery, angioplasty)      Negative  7. LUNG HISTORY: Do you have any history of lung disease?  (e.g., pulmonary embolus, asthma, emphysema)     Extensive  pulmonary history.   9. OTHER SYMPTOMS: Do you have any other symptoms? (e.g., chest pain, cough, dizziness, fever, runny nose)     Denies CP, reports dizziness when she leans over  10. O2 SATURATION MONITOR:  Do you use an oxygen saturation monitor (pulse oximeter) at home? If Yes, ask: What is your reading (oxygen level) today? What is your usual oxygen saturation reading? (e.g., 95%)       98%  Is wearing 2L O2 via [place]  Protocols used: Breathing Difficulty-A-AH

## 2024-09-09 NOTE — ED Triage Notes (Addendum)
 C/o SOB and dizziness that started 2 weeks ago. Pt is increased WOB from ambulation. Pt did not use her prescribed inhalers at home. PMH: COPD

## 2024-09-09 NOTE — ED Provider Notes (Signed)
 "  [facility] Provider Note    Event Date/Time   First MD Initiated Contact with Patient 09/09/24 1240     (approximate)  History   Chief Complaint: Shortness of Breath  HPI  Faith Padilla is a 74 y.o. female with a past medical history of anemia, asthma, COPD prescribed oxygen to be used only if needed patient does not typically require oxygen, hyperlipidemia, presents to the emergency department for shortness of breath.  According to the patient for last 2 weeks or so she has been experiencing worsening shortness of breath for the last few days she has been wearing her oxygen 24 hours a day due to the shortness of breath sensation.  Patient states she does not typically need oxygen but has a prescribed to be used at home if needed.  States on occasion has some mild chest tightness patient states she has been diagnosed as having a large hiatal hernia which has caused some chest discomfort in the past.  Has not been using her nebulizers or COPD medications at home because she did not feel like she needed them.  Physical Exam   Triage Vital Signs: ED Triage Vitals [09/09/24 1200]  Encounter Vitals Group     BP (!) 147/94     Girls Systolic BP Percentile      Girls Diastolic BP Percentile      Boys Systolic BP Percentile      Boys Diastolic BP Percentile      Pulse Rate 100     Resp 20     Temp 98.6 F (37 C)     Temp Source Oral     SpO2 95 %     Weight 262 lb (118.8 kg)     Height 5' 2 (1.575 m)     Head Circumference      Peak Flow      Pain Score 0     Pain Loc      Pain Education      Exclude from Growth Chart     Most recent vital signs: Vitals:   09/09/24 1200  BP: (!) 147/94  Pulse: 100  Resp: 20  Temp: 98.6 F (37 C)  SpO2: 95%    General: Awake, no distress.  CV:  Good peripheral perfusion.  Regular rate and rhythm  Resp:  Normal effort.  Equal breath sounds bilaterally.  No obvious wheeze rales or rhonchi. Abd:  No distention.   Soft, nontender.  No rebound or guarding.  ED Results / Procedures / Treatments   EKG  EKG viewed and interpreted by myself shows a normal sinus rhythm at 99 bpm with a narrow QRS, left axis deviation, largely normal intervals.  RADIOLOGY  I have reviewed interpret the chest x-ray images.  No obvious consolidation on my evaluation.  Large hiatal hernia present.   MEDICATIONS ORDERED IN ED: Medications  methylPREDNISolone  sodium succinate (SOLU-MEDROL ) 125 mg/2 mL injection 125 mg (has no administration in time range)  ipratropium-albuterol  (DUONEB) 0.5-2.5 (3) MG/3ML nebulizer solution 3 mL (has no administration in time range)  ipratropium-albuterol  (DUONEB) 0.5-2.5 (3) MG/3ML nebulizer solution 3 mL (has no administration in time range)     IMPRESSION / MDM / ASSESSMENT AND PLAN / ED COURSE  I reviewed the triage vital signs and the nursing notes.  Patient's presentation is most consistent with acute presentation with potential threat to life or bodily function.  Patient presents to the emergency department for several weeks of worsening shortness of breath  some mild chest tightness.  States she has been wearing her oxygen over the last couple days which she does not typically do.  Here the patient overall appears well no significant findings on physical exam reassuring vital signs.  We will dose DuoNebs, Solu-Medrol  we will add on a troponin to the patient's labs as well as a viral panel.  We will obtain a chest x-ray and continue to closely monitor.  Patient's lab results are reassuring with a normal CBC normal chemistry negative troponin.  Chest x-ray is clear besides the patient's large hiatal hernia.  Patient states she is actually feeling much better she is satting 98 to 100% currently on room air.  We will dose a one-time dose of Decadron  suspect more COPD symptoms possibly her hiatal hernia could be contributing to her symptoms as well recommended over-the-counter liquid Maalox  to be used for hiatal hernia discomfort which she gets from time to time.  Discussed return precautions.  Patient agreeable to plan of care.  FINAL CLINICAL IMPRESSION(S) / ED DIAGNOSES   Dyspnea COPD  Note:  This document was prepared using Dragon voice recognition software and may include unintentional dictation errors.   Dorothyann Drivers, MD 09/09/24 1443  "

## 2024-09-13 DIAGNOSIS — K219 Gastro-esophageal reflux disease without esophagitis: Secondary | ICD-10-CM

## 2024-09-16 ENCOUNTER — Other Ambulatory Visit: Payer: Self-pay | Admitting: Family Medicine

## 2024-09-16 DIAGNOSIS — M159 Polyosteoarthritis, unspecified: Secondary | ICD-10-CM

## 2024-09-16 DIAGNOSIS — N3941 Urge incontinence: Secondary | ICD-10-CM

## 2024-09-16 DIAGNOSIS — J3489 Other specified disorders of nose and nasal sinuses: Secondary | ICD-10-CM

## 2024-09-16 NOTE — Telephone Encounter (Signed)
 Copied from CRM #[id number]. Topic: Clinical - Medication Refill >> Sep 16, 2024  2:48 PM Delon HERO wrote: Medication: meloxicam  (MOBIC ) 15 MG tablet [[id number]] trimethoprim  (TRIMPEX ) 100 MG tablet [[id number]] ipratropium (ATROVENT ) 0.03 % nasal spray [[id number]]   Has the patient contacted their pharmacy? Yes (Agent: If no, request that the patient contact the pharmacy for the refill. If patient does not wish to contact the pharmacy document the reason why and proceed with request.) (Agent: If yes, when and what did the pharmacy advise?)  This is the patient's preferred pharmacy: [id number] - [place], [place] - [address] [[id number]]   Is this the correct pharmacy for this prescription? Yes If no, delete pharmacy and type the correct one.   Has the prescription been filled recently? Yes  Is the patient out of the medication? Yes  Has the patient been seen for an appointment in the last year OR does the patient have an upcoming appointment? Yes  Can we respond through MyChart? Yes  Agent: Please be advised that Rx refills may take up to 3 business days. We ask that you follow-up with your pharmacy.

## 2024-09-19 NOTE — Telephone Encounter (Signed)
 Requested medication (s) are due for refill today: na   Requested medication (s) are on the active medication list: yes  Last refill:  mobic - 07/08/24 #100 1 refills, timpex 08/19/24 #30 4 refills, atrovent  03/25/24 #30 ml 50 refills  Future visit scheduled: yes 11/28/24  Notes to clinic:  manual review ,mobic , medication not assigned to a protocol, trimpex , last ordered by Dr.. McDiarmid. Medication not assigned to a protocol- atrovent . Do you want to refill Rxs? Do you want to order trimpex ?     Requested Prescriptions  Pending Prescriptions Disp Refills   meloxicam  (MOBIC ) 15 MG tablet 100 tablet 1    Sig: Take 1 tablet (15 mg total) by mouth daily.     Analgesics:  COX2 Inhibitors Failed - 09/19/2024  2:44 PM      Failed - Manual Review: Labs are only required if the patient has taken medication for more than 8 weeks.      Passed - HGB in normal range and within 360 days    Hemoglobin  Date Value Ref Range Status  09/09/2024 14.0 12.0 - 15.0 g/dL Final  91/92/7974 86.0 11.1 - 15.9 g/dL Final         Passed - Cr in normal range and within 360 days    Creatinine  Date Value Ref Range Status  08/30/2014 1.26 0.60 - 1.30 mg/dL Final   Creatinine, Ser  Date Value Ref Range Status  09/09/2024 1.00 0.44 - 1.00 mg/dL Final         Passed - HCT in normal range and within 360 days    HCT  Date Value Ref Range Status  09/09/2024 43.0 36.0 - 46.0 % Final   Hematocrit  Date Value Ref Range Status  04/14/2024 43.0 34.0 - 46.6 % Final         Passed - AST in normal range and within 360 days    AST  Date Value Ref Range Status  06/09/2024 20 0 - 40 IU/L Final   SGOT(AST)  Date Value Ref Range Status  07/24/2014 15 15 - 37 Unit/L Final         Passed - ALT in normal range and within 360 days    ALT  Date Value Ref Range Status  06/09/2024 14 0 - 32 IU/L Final   SGPT (ALT)  Date Value Ref Range Status  07/24/2014 20 U/L Final    Comment:    14-63 NOTE: New Reference  Range 03/28/14          Passed - eGFR is 30 or above and within 360 days    EGFR (African American)  Date Value Ref Range Status  08/30/2014 55 (L) >4mL/min Final  04/09/2014 >60  Final   GFR calc Af Amer  Date Value Ref Range Status  03/12/2016 87 >59 mL/min/1.73 Final   EGFR (Non-African Amer.)  Date Value Ref Range Status  08/30/2014 46 (L) >43mL/min Final    Comment:    eGFR values <59mL/min/1.73 m2 may be an indication of chronic kidney disease (CKD). Calculated eGFR, using the MRDR Study equation, is useful in  patients with stable renal function. The eGFR calculation will not be reliable in acutely ill patients when serum creatinine is changing rapidly. It is not useful in patients on dialysis. The eGFR calculation may not be applicable to patients at the low and high extremes of body sizes, pregnant women, and vegetarians.   04/09/2014 53 (L)  Final    Comment:    eGFR  values <64mL/min/1.73 m2 may be an indication of chronic kidney disease (CKD). Calculated eGFR is useful in patients with stable renal function. The eGFR calculation will not be reliable in acutely ill patients when serum creatinine is changing rapidly. It is not useful in  patients on dialysis. The eGFR calculation may not be applicable to patients at the low and high extremes of body sizes, pregnant women, and vegetarians.    GFR, Estimated  Date Value Ref Range Status  09/09/2024 59 (L) >60 mL/min Final    Comment:    (NOTE) Calculated using the CKD-EPI Creatinine Equation (2021)    eGFR  Date Value Ref Range Status  06/09/2024 50 (L) >59 mL/min/1.73 Final         Passed - Patient is not pregnant      Passed - Valid encounter within last 12 months    Recent Outpatient Visits           3 weeks ago Ambulatory dysfunction   [facility] [place], Lauraine SAILOR, DO   2 months ago Mild cognitive impairment   [facility] [place], Lauraine SAILOR,  DO   3 months ago Preoperative clearance   [facility] [place], Lauraine SAILOR, DO   4 months ago Other chest pain   [facility] [place], [place], PA-C   6 months ago Left lower quadrant abdominal pain   [facility] [place], Lauraine SAILOR, DO       Future Appointments             In 2 months MacDiarmid, Glendia, MD [facility] [place]             trimethoprim  (TRIMPEX ) 100 MG tablet 30 tablet 4    Sig: Take 1 tablet (100 mg total) by mouth daily.     Off-Protocol Failed - 09/19/2024  2:44 PM      Failed - Medication not assigned to a protocol, review manually.      Passed - Valid encounter within last 12 months    Recent Outpatient Visits           3 weeks ago Ambulatory dysfunction   [facility] [place], Lauraine SAILOR, DO   2 months ago Mild cognitive impairment   [facility] [place], Lauraine SAILOR, DO   3 months ago Preoperative clearance   [facility] [place], Lauraine SAILOR, DO   4 months ago Other chest pain   [facility] [place], [place], PA-C   6 months ago Left lower quadrant abdominal pain   [facility] [place], Lauraine SAILOR, DO       Future Appointments             In 2 months MacDiarmid, Glendia, MD [facility] [place]             ipratropium (ATROVENT ) 0.03 % nasal spray 30 mL 5     Off-Protocol Failed - 09/19/2024  2:44 PM      Failed - Medication not assigned to a protocol, review manually.      Passed - Valid encounter within last 12 months    Recent Outpatient Visits           3 weeks ago Ambulatory dysfunction   [facility] [place], Lauraine SAILOR, DO   2 months ago Mild cognitive impairment   [facility] [place], Lauraine SAILOR, DO   3 months ago Preoperative clearance   [facility]  [place], Lauraine SAILOR, DO   4 months ago Other chest pain   [facility] [place], [place], [place]   6 months ago Left lower quadrant abdominal pain   [facility] [place], Lauraine SAILOR, DO       Future Appointments             In 2 months Gaston Hamilton, MD [facility] [place]           Off-Protocol Failed - 09/19/2024  2:44 PM      Failed - Medication not assigned to a protocol, review manually.      Passed - Valid encounter within last 12 months    Recent Outpatient Visits           3 weeks ago Ambulatory dysfunction   [facility] [place], Lauraine SAILOR, DO   2 months ago Mild cognitive impairment   [facility] [place], Lauraine SAILOR, DO   3 months ago Preoperative clearance   [facility] [place], Lauraine SAILOR, DO   4 months ago Other chest pain   [facility] [place], [place], PA-C   6 months ago Left lower quadrant abdominal pain   [facility] [place], Lauraine SAILOR, DO       Future Appointments             In 2 months MacDiarmid, Hamilton, MD [facility]

## 2024-09-20 MED ORDER — IPRATROPIUM BROMIDE 0.03 % NA SOLN: 2.0000 | Freq: Two times a day (BID) | NASAL | 5 refills | Status: AC

## 2024-09-22 ENCOUNTER — Ambulatory Visit (INDEPENDENT_AMBULATORY_CARE_PROVIDER_SITE_OTHER): Admitting: Professional Counselor

## 2024-09-22 ENCOUNTER — Other Ambulatory Visit: Payer: Self-pay | Admitting: *Deleted

## 2024-09-22 DIAGNOSIS — F3341 Major depressive disorder, recurrent, in partial remission: Secondary | ICD-10-CM | POA: Diagnosis not present

## 2024-09-22 DIAGNOSIS — N3941 Urge incontinence: Secondary | ICD-10-CM

## 2024-09-22 MED ORDER — TRIMETHOPRIM 100 MG PO TABS: 100.0000 mg | ORAL_TABLET | Freq: Every day | ORAL | 3 refills | Status: AC

## 2024-09-22 NOTE — Progress Notes (Signed)
" °  THERAPIST PROGRESS NOTE  Session Time: 11:00 AM - 11:57 AM   Participation Level: Active  Behavioral Response: Casual, Alert, Euthymic  Type of Therapy: Individual Therapy  Treatment Goals addressed: Active Depression  LTG: I guess when things get to where I can say what I want to say. Stand up for myself. Accept and know I can't change her.  (Progressing)                Start:  02/15/24    Expected End:  02/13/25    STG: To improve assertiveness AEB utilizing interpersonal effectiveness skills over the next 90 days.  (Progressing)  Goal Note Reviewed 05/10/24 I've got this neighbor. She doesn't speak English very much. When she comes visit, she just walks right in. It happened a lot when I was sick, I just left the door unlocked, but now she just walks right in. I told her she's gonna have to ring the doorbell. This was yesterday.    STG: To reduce judgementalness AEB practicing mindfulness skills and focusing on what's within Faith Padilla's control over the next 90 days. (Progressing)  Goal Note Reviewed 05/10/24 I'm ill with the cat again and I'm ill with the lady who works in the [employer].    STG: To improve memory AEB utilizing mindfulness skills over the next 12 weeks (Initial)  ProgressTowards Goals: Progressing  Interventions: Motivational Interviewing and Supportive  Summary: Faith Padilla is a 74 y.o. female who presents with a history of depression. She appeared alert and oriented x5. She stated the holidays and her birthday went well. Faith Padilla discussed recent health issues and how she has been managing things. She noted ways she is trying to engage with other residents where she lives and hobbies of crocheting.  Therapist Response: Conducted session with Faith Padilla. Began session with check-in/update since previous session. Utilized empathetic and reflective listening. Used open-ended questions to facilitate discussion and summarized Faith Padilla's thoughts/feelings. Scheduled  additional appointment and concluded session.   Suicidal/Homicidal: No  Plan: Return again in 4 weeks.  Diagnosis: MDD (major depressive disorder), recurrent, in partial remission  Collaboration of Care: Medication Management AEB chart review  Patient/Guardian was advised Release of Information must be obtained prior to any record release in order to collaborate their care with an outside provider. Patient/Guardian was advised if they have not already done so to contact the registration department to sign all necessary forms in order for us  to release information regarding their care.   Consent: Patient/Guardian gives verbal consent for treatment and assignment of benefits for services provided during this visit. Patient/Guardian expressed understanding and agreed to proceed.   Faith Padilla, [facility] 09/22/2024  "

## 2024-09-27 ENCOUNTER — Other Ambulatory Visit: Payer: Self-pay | Admitting: Family Medicine

## 2024-09-27 DIAGNOSIS — M159 Polyosteoarthritis, unspecified: Secondary | ICD-10-CM

## 2024-09-27 DIAGNOSIS — N3941 Urge incontinence: Secondary | ICD-10-CM

## 2024-09-27 DIAGNOSIS — J3489 Other specified disorders of nose and nasal sinuses: Secondary | ICD-10-CM

## 2024-09-27 NOTE — Telephone Encounter (Signed)
 Requested by patient. Duplicate requests.  Requested Prescriptions  Refused Prescriptions Disp Refills   trimethoprim  (TRIMPEX ) 100 MG tablet 30 tablet 3    Sig: Take 1 tablet (100 mg total) by mouth daily.     Off-Protocol Failed - 09/27/2024  3:50 PM      Failed - Medication not assigned to a protocol, review manually.      Passed - Valid encounter within last 12 months    Recent Outpatient Visits           4 weeks ago Ambulatory dysfunction   [facility] [place], Lauraine SAILOR, DO   2 months ago Mild cognitive impairment   [facility] [place], Lauraine SAILOR, DO   3 months ago Preoperative clearance   [facility] [place], Lauraine SAILOR, DO   5 months ago Other chest pain   [facility] [place], [place], PA-C   7 months ago Left lower quadrant abdominal pain   [facility] [place], Lauraine SAILOR, DO       Future Appointments             In 2 months MacDiarmid, Glendia, MD [facility] [place]             meloxicam  (MOBIC ) 15 MG tablet 100 tablet 1    Sig: Take 1 tablet (15 mg total) by mouth daily.     Analgesics:  COX2 Inhibitors Failed - 09/27/2024  3:50 PM      Failed - Manual Review: Labs are only required if the patient has taken medication for more than 8 weeks.      Passed - HGB in normal range and within 360 days    Hemoglobin  Date Value Ref Range Status  09/09/2024 14.0 12.0 - 15.0 g/dL Final  91/92/7974 86.0 11.1 - 15.9 g/dL Final         Passed - Cr in normal range and within 360 days    Creatinine  Date Value Ref Range Status  08/30/2014 1.26 0.60 - 1.30 mg/dL Final   Creatinine, Ser  Date Value Ref Range Status  09/09/2024 1.00 0.44 - 1.00 mg/dL Final         Passed - HCT in normal range and within 360 days    HCT  Date Value Ref Range Status  09/09/2024 43.0 36.0 - 46.0 % Final   Hematocrit  Date Value Ref Range Status   04/14/2024 43.0 34.0 - 46.6 % Final         Passed - AST in normal range and within 360 days    AST  Date Value Ref Range Status  06/09/2024 20 0 - 40 IU/L Final   SGOT(AST)  Date Value Ref Range Status  07/24/2014 15 15 - 37 Unit/L Final         Passed - ALT in normal range and within 360 days    ALT  Date Value Ref Range Status  06/09/2024 14 0 - 32 IU/L Final   SGPT (ALT)  Date Value Ref Range Status  07/24/2014 20 U/L Final    Comment:    14-63 NOTE: New Reference Range 03/28/14          Passed - eGFR is 30 or above and within 360 days    EGFR (African American)  Date Value Ref Range Status  08/30/2014 55 (L) >58mL/min Final  04/09/2014 >60  Final   GFR calc Af [place]  Date Value Ref Range Status  03/12/2016 87 >59 mL/min/1.73 Final   EGFR (Non-African Amer.)  Date Value Ref Range Status  08/30/2014 46 (L) >26mL/min Final    Comment:    eGFR values <11mL/min/1.73 m2 may be an indication of chronic kidney disease (CKD). Calculated eGFR, using the MRDR Study equation, is useful in  patients with stable renal function. The eGFR calculation will not be reliable in acutely ill patients when serum creatinine is changing rapidly. It is not useful in patients on dialysis. The eGFR calculation may not be applicable to patients at the low and high extremes of body sizes, pregnant women, and vegetarians.   04/09/2014 53 (L)  Final    Comment:    eGFR values <59mL/min/1.73 m2 may be an indication of chronic kidney disease (CKD). Calculated eGFR is useful in patients with stable renal function. The eGFR calculation will not be reliable in acutely ill patients when serum creatinine is changing rapidly. It is not useful in  patients on dialysis. The eGFR calculation may not be applicable to patients at the low and high extremes of body sizes, pregnant women, and vegetarians.    GFR, Estimated  Date Value Ref Range Status  09/09/2024 59 (L) >60 mL/min Final     Comment:    (NOTE) Calculated using the CKD-EPI Creatinine Equation (2021)    eGFR  Date Value Ref Range Status  06/09/2024 50 (L) >59 mL/min/1.73 Final         Passed - Patient is not pregnant      Passed - Valid encounter within last 12 months    Recent Outpatient Visits           4 weeks ago Ambulatory dysfunction   [facility] [place], Lauraine SAILOR, DO   2 months ago Mild cognitive impairment   [facility] [place], Lauraine SAILOR, DO   3 months ago Preoperative clearance   [facility] [place], Lauraine SAILOR, DO   5 months ago Other chest pain   [place] [facility] [place], [place], PA-C   7 months ago Left lower quadrant abdominal pain   [facility] [place], Lauraine SAILOR, DO       Future Appointments             In 2 months MacDiarmid, Glendia, MD [facility] [place]             ipratropium (ATROVENT ) 0.03 % nasal spray 30 mL 5    Sig: Place 2 sprays into both nostrils 2 (two) times daily.     Off-Protocol Failed - 09/27/2024  3:50 PM      Failed - Medication not assigned to a protocol, review manually.      Passed - Valid encounter within last 12 months    Recent Outpatient Visits           4 weeks ago Ambulatory dysfunction   [facility] [place], Lauraine SAILOR, DO   2 months ago Mild cognitive impairment   [facility] [place], Lauraine SAILOR, DO   3 months ago Preoperative clearance   [facility] [place], Lauraine SAILOR, DO   5 months ago Other chest pain    [facility] [place], [place], PA-C   7 months ago Left lower quadrant abdominal pain   [facility] [place], Lauraine SAILOR, [place]  Future Appointments             In 2 months Gaston Hamilton, MD [facility]rology [place]           Off-Protocol Failed - 09/27/2024  3:50 PM       Failed - Medication not assigned to a protocol, review manually.      Passed - Valid encounter within last 12 months    Recent Outpatient Visits           4 weeks ago Ambulatory dysfunction   [facility] [place], Lauraine SAILOR, DO   2 months ago Mild cognitive impairment   [facility] [place], Lauraine SAILOR, DO   3 months ago Preoperative clearance   [facility] [place], Lauraine SAILOR, DO   5 months ago Other chest pain   [place] [facility] [place], [employer], PA-C   7 months ago Left lower quadrant abdominal pain   [facility] [place], Lauraine SAILOR, DO       Future Appointments             In 2 months MacDiarmid, Hamilton, MD [facility]

## 2024-09-27 NOTE — Telephone Encounter (Signed)
 Requested medication (s) are due for refill today: na   Requested medication (s) are on the active medication list: yes   Last refill:  trimpex - 09/22/24 #30 3 refills, mobic - 07/08/24 #100 1 refills, atrovent - 09/20/24 #30 ml 5 refills  Future visit scheduled: yes 11/28/24  Notes to clinic:  medications not assigned to a protocol. Atrovent  - receipt confirmed by pharmacy 09/20/24 at 8:47 am, trimpex  last ordered by CANDIE Elizabeth, MD, receipt confirmed by pharmacy 09/22/24 at 10:22 am. Mobic - sent to different pharmacy, manual review of labs.       Requested Prescriptions  Pending Prescriptions Disp Refills   trimethoprim  (TRIMPEX ) 100 MG tablet 30 tablet 3    Sig: Take 1 tablet (100 mg total) by mouth daily.     Off-Protocol Failed - 09/27/2024  3:28 PM      Failed - Medication not assigned to a protocol, review manually.      Passed - Valid encounter within last 12 months    Recent Outpatient Visits           4 weeks ago Ambulatory dysfunction   [facility] [place], Lauraine SAILOR, DO   2 months ago Mild cognitive impairment   [facility] [place], Lauraine SAILOR, DO   3 months ago Preoperative clearance   [facility] [place], Lauraine SAILOR, DO   5 months ago Other chest pain   [facility] [place], [place], PA-C   7 months ago Left lower quadrant abdominal pain   [facility] [place], Lauraine SAILOR, DO       Future Appointments             In 2 months MacDiarmid, Glendia, MD [facility] [place]             meloxicam  (MOBIC ) 15 MG tablet 100 tablet 1    Sig: Take 1 tablet (15 mg total) by mouth daily.     Analgesics:  COX2 Inhibitors Failed - 09/27/2024  3:28 PM      Failed - Manual Review: Labs are only required if the patient has taken medication for more than 8 weeks.      Passed - HGB in normal range and within 360 days    Hemoglobin  Date Value Ref  Range Status  09/09/2024 14.0 12.0 - 15.0 g/dL Final  91/92/7974 86.0 11.1 - 15.9 g/dL Final         Passed - Cr in normal range and within 360 days    Creatinine  Date Value Ref Range Status  08/30/2014 1.26 0.60 - 1.30 mg/dL Final   Creatinine, Ser  Date Value Ref Range Status  09/09/2024 1.00 0.44 - 1.00 mg/dL Final         Passed - HCT in normal range and within 360 days    HCT  Date Value Ref Range Status  09/09/2024 43.0 36.0 - 46.0 % Final   Hematocrit  Date Value Ref Range Status  04/14/2024 43.0 34.0 - 46.6 % Final         Passed - AST in normal range and within 360 days    AST  Date Value Ref Range Status  06/09/2024 20 0 - 40 IU/L Final   SGOT(AST)  Date Value Ref Range Status  07/24/2014 15 15 - 37 Unit/L Final         Passed - ALT in normal range and within 360 days  ALT  Date Value Ref Range Status  06/09/2024 14 0 - 32 IU/L Final   SGPT (ALT)  Date Value Ref Range Status  07/24/2014 20 U/L Final    Comment:    14-63 NOTE: New Reference Range 03/28/14          Passed - eGFR is 30 or above and within 360 days    EGFR (African American)  Date Value Ref Range Status  08/30/2014 55 (L) >59mL/min Final  04/09/2014 >60  Final   GFR calc Af Amer  Date Value Ref Range Status  03/12/2016 87 >59 mL/min/1.73 Final   EGFR (Non-African Amer.)  Date Value Ref Range Status  08/30/2014 46 (L) >74mL/min Final    Comment:    eGFR values <83mL/min/1.73 m2 may be an indication of chronic kidney disease (CKD). Calculated eGFR, using the MRDR Study equation, is useful in  patients with stable renal function. The eGFR calculation will not be reliable in acutely ill patients when serum creatinine is changing rapidly. It is not useful in patients on dialysis. The eGFR calculation may not be applicable to patients at the low and high extremes of body sizes, pregnant women, and vegetarians.   04/09/2014 53 (L)  Final    Comment:    eGFR values  <30mL/min/1.73 m2 may be an indication of chronic kidney disease (CKD). Calculated eGFR is useful in patients with stable renal function. The eGFR calculation will not be reliable in acutely ill patients when serum creatinine is changing rapidly. It is not useful in  patients on dialysis. The eGFR calculation may not be applicable to patients at the low and high extremes of body sizes, pregnant women, and vegetarians.    GFR, Estimated  Date Value Ref Range Status  09/09/2024 59 (L) >60 mL/min Final    Comment:    (NOTE) Calculated using the CKD-EPI Creatinine Equation (2021)    eGFR  Date Value Ref Range Status  06/09/2024 50 (L) >59 mL/min/1.73 Final         Passed - Patient is not pregnant      Passed - Valid encounter within last 12 months    Recent Outpatient Visits           4 weeks ago Ambulatory dysfunction   [facility] [place], Lauraine SAILOR, DO   2 months ago Mild cognitive impairment   [facility] [place], Lauraine SAILOR, DO   3 months ago Preoperative clearance   [facility] [place], Lauraine SAILOR, DO   5 months ago Other chest pain   [facility] [place], [place], PA-C   7 months ago Left lower quadrant abdominal pain   [facility] [place], Lauraine SAILOR, DO       Future Appointments             In 2 months MacDiarmid, Glendia, MD [facility]rology              ipratropium (ATROVENT ) 0.03 % nasal spray 30 mL 5    Sig: Place 2 sprays into both nostrils 2 (two) times daily.     Off-Protocol Failed - 09/27/2024  3:28 PM      Failed - Medication not assigned to a protocol, review manually.      Passed - Valid encounter within last 12 months    Recent Outpatient Visits           4 weeks ago Ambulatory dysfunction  [facility] [place], Lauraine SAILOR, DO   2 months ago Mild cognitive impairment   [facility] [place], Lauraine SAILOR, DO   3 months ago Preoperative clearance   [facility] [place], Lauraine SAILOR, DO   5 months ago Other chest pain   [facility] [place], [place], [place]   7 months ago Left lower quadrant abdominal pain   [facility] [place], Lauraine SAILOR, DO       Future Appointments             In 2 months Gaston Hamilton, MD [facility] [place]           Off-Protocol Failed - 09/27/2024  3:28 PM      Failed - Medication not assigned to a protocol, review manually.      Passed - Valid encounter within last 12 months    Recent Outpatient Visits           4 weeks ago Ambulatory dysfunction   [facility] [place], Lauraine SAILOR, DO   2 months ago Mild cognitive impairment   [facility] [place], Lauraine SAILOR, DO   3 months ago Preoperative clearance   [facility] [place], Lauraine SAILOR, DO   5 months ago Other chest pain   [facility] [place], [place], PA-C   7 months ago Left lower quadrant abdominal pain   [facility] [place], Lauraine SAILOR, DO       Future Appointments             In 2 months MacDiarmid, Hamilton, MD [facility]

## 2024-09-27 NOTE — Telephone Encounter (Unsigned)
 Copied from CRM #[id number]. Topic: Clinical - Medication Refill >> Sep 16, 2024  2:48 PM Delon HERO wrote: Medication: meloxicam  (MOBIC ) 15 MG tablet [[id number]] trimethoprim  (TRIMPEX ) 100 MG tablet [[id number]] ipratropium (ATROVENT ) 0.03 % nasal spray [[id number]]   Has the patient contacted their pharmacy? Yes (Agent: If no, request that the patient contact the pharmacy for the refill. If patient does not wish to contact the pharmacy document the reason why and proceed with request.) (Agent: If yes, when and what did the pharmacy advise?)  This is the patient's preferred pharmacy: [id number] - [place], [place] - [address] [[id number]]   Is this the correct pharmacy for this prescription? Yes If no, delete pharmacy and type the correct one.   Has the prescription been filled recently? Yes  Is the patient out of the medication? Yes  Has the patient been seen for an appointment in the last year OR does the patient have an upcoming appointment? Yes  Can we respond through MyChart? Yes  Agent: Please be advised that Rx refills may take up to 3 business days. We ask that you follow-up with your pharmacy.
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
## 2024-10-03 ENCOUNTER — Other Ambulatory Visit: Payer: Self-pay | Admitting: Psychiatry

## 2024-10-03 ENCOUNTER — Telehealth: Payer: Self-pay | Admitting: Psychiatry

## 2024-10-03 MED ORDER — VENLAFAXINE HCL ER 37.5 MG PO CP24: 37.5000 mg | ORAL_CAPSULE | Freq: Every day | ORAL | 1 refills | Status: AC

## 2024-10-03 NOTE — Telephone Encounter (Signed)
 Ordered 37.5 mg cap. She should have refills of 150 mg cap- please advise her to notify the office if difficulty in filling the medication.

## 2024-10-04 ENCOUNTER — Ambulatory Visit: Admitting: Psychiatry

## 2024-10-06 ENCOUNTER — Ambulatory Visit: Admitting: Psychiatry

## 2024-10-20 ENCOUNTER — Ambulatory Visit: Admitting: Professional Counselor

## 2024-10-26 ENCOUNTER — Ambulatory Visit: Admitting: Sleep Medicine

## 2024-10-27 ENCOUNTER — Ambulatory Visit: Admitting: Psychiatry

## 2024-11-10 ENCOUNTER — Ambulatory Visit: Admitting: Psychiatry

## 2024-11-28 ENCOUNTER — Ambulatory Visit: Admitting: Family Medicine

## 2024-12-12 ENCOUNTER — Ambulatory Visit: Admitting: Urology

## 2025-09-13 ENCOUNTER — Ambulatory Visit
